# Patient Record
Sex: Male | Born: 1990 | Race: White | Hispanic: No | Marital: Single | Smoking: Current every day smoker
Health system: Southern US, Community
[De-identification: ages and names within clinical notes are randomized; demographics above are authoritative.]

## PROBLEM LIST (undated history)

## (undated) DIAGNOSIS — F3189 Other bipolar disorder: Secondary | ICD-10-CM

## (undated) DIAGNOSIS — S60418A Abrasion of other finger, initial encounter: Secondary | ICD-10-CM

## (undated) DIAGNOSIS — M2240 Chondromalacia patellae, unspecified knee: Secondary | ICD-10-CM

## (undated) DIAGNOSIS — K219 Gastro-esophageal reflux disease without esophagitis: Secondary | ICD-10-CM

## (undated) DIAGNOSIS — J302 Other seasonal allergic rhinitis: Secondary | ICD-10-CM

---
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
## 2011-01-13 ENCOUNTER — Encounter (HOSPITAL_COMMUNITY)
Admission: RE | Admit: 2011-01-13 | Discharge: 2011-01-13 | Disposition: A | Discharge status: Home / Self Care | Payer: 59 | Source: Ambulatory Visit | Attending: Podiatry | Admitting: Podiatry

## 2011-01-13 LAB — HEMOGLOBIN AND HEMATOCRIT, BLOOD
HCT: 45.4 % (ref 39.0–52.0)
Hemoglobin: 15.8 g/dL (ref 13.0–17.0)

## 2011-01-13 LAB — SURGICAL PCR SCREEN
MRSA, PCR: NEGATIVE
Staphylococcus aureus: NEGATIVE

## 2011-01-19 ENCOUNTER — Ambulatory Visit (HOSPITAL_COMMUNITY)
Admission: RE | Admit: 2011-01-19 | Discharge: 2011-01-19 | Disposition: A | Discharge status: Home / Self Care | Payer: 59 | Source: Ambulatory Visit | Attending: Podiatry | Admitting: Podiatry

## 2011-01-19 ENCOUNTER — Other Ambulatory Visit: Payer: Self-pay

## 2011-01-19 DIAGNOSIS — M795 Residual foreign body in soft tissue: Secondary | ICD-10-CM | POA: Insufficient documentation

## 2011-01-19 DIAGNOSIS — Z01812 Encounter for preprocedural laboratory examination: Secondary | ICD-10-CM | POA: Insufficient documentation

## 2011-01-19 DIAGNOSIS — B070 Plantar wart: Secondary | ICD-10-CM | POA: Insufficient documentation

## 2011-01-19 HISTORY — PX: FOOT FOREIGN BODY REMOVAL: SUR1116

## 2011-09-18 ENCOUNTER — Emergency Department (INDEPENDENT_AMBULATORY_CARE_PROVIDER_SITE_OTHER)
Admission: EM | Admit: 2011-09-18 | Discharge: 2011-09-18 | Discharge status: Against Medical Advice | Payer: 59 | Source: Home / Self Care

## 2011-09-18 NOTE — ED Notes (Signed)
Pt   Not in  Waiting room  When  [facility]

## 2011-09-18 NOTE — ED Notes (Signed)
Pt  Called   2  More  Times  5  mins  Apart       Must  Have  Eloped

## 2011-09-23 ENCOUNTER — Encounter (HOSPITAL_COMMUNITY): Payer: Self-pay | Admitting: Cardiology

## 2011-09-23 ENCOUNTER — Emergency Department (INDEPENDENT_AMBULATORY_CARE_PROVIDER_SITE_OTHER): Payer: 59

## 2011-09-23 ENCOUNTER — Emergency Department (HOSPITAL_COMMUNITY)
Admission: EM | Admit: 2011-09-23 | Discharge: 2011-09-23 | Disposition: A | Discharge status: Home / Self Care | Payer: 59 | Source: Home / Self Care | Attending: Emergency Medicine | Admitting: Emergency Medicine

## 2011-09-23 DIAGNOSIS — J40 Bronchitis, not specified as acute or chronic: Secondary | ICD-10-CM

## 2011-09-23 LAB — POCT RAPID STREP A: Streptococcus, Group A Screen (Direct): NEGATIVE

## 2011-09-23 MED ORDER — BENZONATATE 200 MG PO CAPS: 200.0000 mg | ORAL_CAPSULE | Freq: Three times a day (TID) | ORAL | Status: AC | PRN

## 2011-09-23 MED ORDER — AZITHROMYCIN 250 MG PO TABS: ORAL_TABLET | ORAL | Status: AC

## 2011-09-23 MED ORDER — ALBUTEROL SULFATE HFA 108 (90 BASE) MCG/ACT IN AERS: 1.0000 | INHALATION_SPRAY | Freq: Four times a day (QID) | RESPIRATORY_TRACT | Status: DC | PRN

## 2011-09-23 NOTE — ED Notes (Signed)
No E box for pt to sign out

## 2011-09-23 NOTE — ED Provider Notes (Signed)
Chief Complaint  Patient presents with  . Sore Throat  . Nasal Congestion  . Cough    History of Present Illness:   Clinton Fisher is a 21 year old male who has had a one half week history of headache, ear congestion, sore throat, postnasal drip, nasal congestion with yellow-green drainage, sinus pressure, cough productive yellow-green sputum with some blood, tightness in chest, fever of up to 101, chills, vomiting, and diarrhea. He saw Dr.Ehinger about a week ago and was given hydrocodone cough syrup, but doesn't feel any better.  Review of Systems:  Other than noted above, the patient denies any of the following symptoms. Systemic:  No fever, chills, sweats, fatigue, myalgias, headache, or anorexia. Eye:  No redness, pain or drainage. ENT:  No earache, nasal congestion, rhinorrhea, sinus pressure, or sore throat. Lungs:  No cough, sputum production, wheezing, shortness of breath. Or chest pain. GI:  No nausea, vomiting, abdominal pain or diarrhea. Skin:  No rash or itching.  PMFSH:  Past medical history, family history, social history, meds, and allergies were reviewed.  Physical Exam:   Vital signs:  BP 129/86  Pulse 93  Temp(Src) 98.3 F (36.8 C) (Oral)  Resp 18  SpO2 99% General:  Alert, in no distress. Eye:  No conjunctival injection or drainage. ENT:  TMs and canals were normal, without erythema or inflammation.  Nasal mucosa was clear and uncongested, without drainage.  Mucous membranes were moist.  Pharynx was clear, without exudate or drainage.  There were no oral ulcerations or lesions. Neck:  Supple, no adenopathy, tenderness or mass. Lungs:  No respiratory distress.  Lungs were clear to auscultation, without wheezes, rales or rhonchi.  Breath sounds were clear and equal bilaterally. Heart:  Regular rhythm, without gallops, murmers or rubs. Skin:  Clear, warm, and dry, without rash or lesions.  Labs:   Results for orders placed during the hospital encounter of 09/23/11  POCT  RAPID STREP A ([facility]RG CARE ONLY)      Component Value Range   Streptococcus, Group A Screen (Direct) NEGATIVE  NEGATIVE      Radiology:  Dg Chest 2 View  09/23/2011  *RADIOLOGY REPORT*  Clinical Data: Cough.  Fever  CHEST - 2 VIEW  Comparison: None  Findings: The heart size and mediastinal contours are within normal limits.  Both lungs are clear.  The visualized skeletal structures are unremarkable.  IMPRESSION: Negative exam.  Original Report Authenticated By: Rosealee Albee, M.D.    Assessment:   Diagnoses that have been ruled out:  None  Diagnoses that are still under consideration:  None  Final diagnoses:  Bronchitis      Plan:   1.  The following meds were prescribed:   New Prescriptions   ALBUTEROL (PROVENTIL HFA;VENTOLIN HFA) 108 (90 BASE) MCG/ACT INHALER    Inhale 1-2 puffs into the lungs every 6 (six) hours as needed for wheezing.   AZITHROMYCIN (ZITHROMAX Z-PAK) 250 MG TABLET    Take as directed.   BENZONATATE (TESSALON) 200 MG CAPSULE    Take 1 capsule (200 mg total) by mouth 3 (three) times daily as needed for cough.   2.  The patient was instructed in symptomatic care and handouts were given. 3.  The patient was told to return if becoming worse in any way, if no better in 3 or 4 days, and given some red flag symptoms that would indicate earlier return.   Roque Lias, MD 09/23/11 [phone]

## 2011-09-23 NOTE — ED Notes (Signed)
Pt report cough, sore throat, nasal congestion and ear pain. Pt report green/yellow phlem this morning. Symptoms have been going on for the past week. Reports little fever but has been taking tylenol for discomfort and hydrocodone cough med. Pt states the reason cough is under control during exam is due to taking cough med and he has taken last dose of cough med.

## 2011-09-23 NOTE — Discharge Instructions (Signed)

## 2011-10-22 ENCOUNTER — Emergency Department (HOSPITAL_COMMUNITY)
Admission: EM | Admit: 2011-10-22 | Discharge: 2011-10-22 | Disposition: A | Discharge status: Home / Self Care | Payer: 59 | Source: Home / Self Care | Attending: Family Medicine | Admitting: Family Medicine

## 2011-10-22 ENCOUNTER — Encounter (HOSPITAL_COMMUNITY): Payer: Self-pay

## 2011-10-22 DIAGNOSIS — J309 Allergic rhinitis, unspecified: Secondary | ICD-10-CM

## 2011-10-22 DIAGNOSIS — R05 Cough: Secondary | ICD-10-CM

## 2011-10-22 MED ORDER — HYDROCOD POLST-CHLORPHEN POLST 10-8 MG/5ML PO LQCR: 5.0000 mL | Freq: Two times a day (BID) | ORAL | Status: DC | PRN

## 2011-10-22 MED ORDER — FLUTICASONE PROPIONATE 50 MCG/ACT NA SUSP: 2.0000 | Freq: Every day | NASAL | Status: DC

## 2011-10-22 NOTE — ED Notes (Signed)
Pt has had headache, ears popping, coughing and throat soreness for three weeks. Was seen on 3-2 and had neg chest xray and given zpack.  Pt states he improved and then he started coughing again.  Had rx for cough medicine and lost it after two doses.

## 2011-10-22 NOTE — Discharge Instructions (Signed)
Use the prescribed nasal spray as directed and may also use an over the counter antihistamine such as loratadine (Claritin), cetirizine (Zyrtec), or fexofenadine (Allegra); discontinue current Allergy medication. Use the prescription cough syrup as directed and only as needed. Should you have fever, I recommend aggressive fever control with acetaminophen (Tylenol) and/or ibuprofen. You may use these together, alternating them every 4 hours, or individually, every 8 hours. For example, take acetaminophen 500 to 1000 mg at 12 noon, then 600 to 800 mg of ibuprofen at 4 pm, then acetaminophen at 8 pm, etc. Also, stay hydrated with clear liquids. Return to care should your symptoms not improve, or worsen in any way

## 2011-10-22 NOTE — ED Notes (Signed)
Mother called and states she found the empty hycodan bottle that he received on 10-16-11 and had stated he lost.  He denies taking it and instructed per Dr. Juanetta Gosling that she can either tear up the rx we gave him or she can monitor it and given it to him when he is coughing at hs.  She states she knows he has smoked pot in the past and worries that he may have a drug problem.  I instructed her to call [facility] if she is concerned.

## 2011-10-22 NOTE — ED Provider Notes (Signed)
History     CSN: [id number]  Arrival date & time 10/22/11  0913   First MD Initiated Contact with Patient 10/22/11 [phone]      Chief Complaint  Patient presents with  . URI    (Consider location/radiation/quality/duration/timing/severity/associated sxs/prior treatment) HPI Comments: Clinton Fisher presents for evaluation of 5 weeks of persistent cough. He and his mother report persistence of symptoms despite multiple treatment regimens. He was first seen by his primary care doctor and given hydrocodone cough syrup, which helped his symptoms mildly. He was then seen here on March 2 and was given albuterol, azithromycin, and Tessalon Perles. He reports that his symptoms improved, over the next week. But now returned. He reports nasal congestion, and nighttime cough. He also endorses a history of allergies. He is taking several over-the-counter preparations with mild improvement.  Patient is a 21 y.o. male presenting with cough. The history is provided by the patient and a parent.  Cough This is a recurrent problem. The current episode started more than 1 week ago. The problem occurs constantly. The problem has not changed since onset.The cough is productive of sputum. There has been no fever. Associated symptoms include headaches, rhinorrhea, sore throat and shortness of breath. Pertinent negatives include no wheezing. He has tried cough syrup and decongestants for the symptoms.    History reviewed. No pertinent past medical history.  History reviewed. No pertinent past surgical history.  History reviewed. No pertinent family history.  History  Substance Use Topics  . Smoking status: Never Smoker   . Smokeless tobacco: Not on file  . Alcohol Use: Yes     occas      Review of Systems  Constitutional: Negative.   HENT: Positive for congestion, sore throat, rhinorrhea and postnasal drip.   Eyes: Negative.   Respiratory: Positive for cough and shortness of breath. Negative for wheezing.     Cardiovascular: Negative.   Gastrointestinal: Negative.   Genitourinary: Negative.   Musculoskeletal: Negative.   Skin: Negative.   Neurological: Positive for headaches.    Allergies  Review of patient's allergies indicates no known allergies.  Home Medications   Current Outpatient Rx  Name Route Sig Dispense Refill  . BENZONATATE 200 MG PO CAPS Oral Take 200 mg by mouth 3 (three) times daily as needed.    . ACETAMINOPHEN 500 MG PO TABS Oral Take 1,000 mg by mouth every 6 (six) hours as needed.    . ALBUTEROL SULFATE HFA 108 (90 BASE) MCG/ACT IN AERS Inhalation Inhale 1-2 puffs into the lungs every 6 (six) hours as needed for wheezing. 1 Inhaler 0  . HYDROCOD POLST-CPM POLST ER 10-8 MG/5ML PO LQCR Oral Take 5 mLs by mouth every 12 (twelve) hours as needed. 140 mL 0  . FLUTICASONE PROPIONATE 50 MCG/ACT NA SUSP Nasal Place 2 sprays into the nose daily. 16 g 2  . PSEUDOEPHEDRINE-GUAIFENESIN ER [id number] MG PO TB12 Oral Take by mouth 2 (two) times daily.      BP 126/77  Pulse 90  Temp(Src) 99.1 F (37.3 C) (Oral)  Resp 16  SpO2 98%  Physical Exam  Nursing note and vitals reviewed. Constitutional: He is oriented to person, place, and time. He appears well-developed and well-nourished.  HENT:  Head: Normocephalic and atraumatic.  Right Ear: Tympanic membrane is retracted.  Left Ear: Tympanic membrane is retracted.  Mouth/Throat: Uvula is midline, oropharynx is clear and moist and mucous membranes are normal.  Eyes: EOM are normal.  Neck: Normal range of motion.  Cardiovascular:  Normal rate, regular rhythm, S1 normal, S2 normal and normal heart sounds.   No murmur heard. Pulmonary/Chest: Effort normal and breath sounds normal. He has no decreased breath sounds. He has no wheezes. He has no rhonchi.  Musculoskeletal: Normal range of motion.  Neurological: He is alert and oriented to person, place, and time.  Skin: Skin is warm and dry.  Psychiatric: His behavior is normal.     ED Course  Procedures (including critical care time)  Labs Reviewed - No data to display No results found.   1. Allergic rhinitis   2. Cough       MDM  Given rx for fluticasone and Tussionex cough syrup        Renaee Munda, MD 10/22/11 1010

## 2011-12-08 ENCOUNTER — Encounter (HOSPITAL_COMMUNITY): Payer: Self-pay | Admitting: *Deleted

## 2011-12-08 ENCOUNTER — Emergency Department (HOSPITAL_COMMUNITY)
Admission: EM | Admit: 2011-12-08 | Discharge: 2011-12-08 | Disposition: A | Discharge status: Home / Self Care | Payer: 59 | Attending: Emergency Medicine | Admitting: Emergency Medicine

## 2011-12-08 DIAGNOSIS — M79609 Pain in unspecified limb: Secondary | ICD-10-CM | POA: Insufficient documentation

## 2011-12-08 DIAGNOSIS — T6391XA Toxic effect of contact with unspecified venomous animal, accidental (unintentional), initial encounter: Secondary | ICD-10-CM | POA: Insufficient documentation

## 2011-12-08 DIAGNOSIS — W5911XA Bitten by nonvenomous snake, initial encounter: Secondary | ICD-10-CM

## 2011-12-08 DIAGNOSIS — T63001A Toxic effect of unspecified snake venom, accidental (unintentional), initial encounter: Secondary | ICD-10-CM | POA: Insufficient documentation

## 2011-12-08 DIAGNOSIS — R942 Abnormal results of pulmonary function studies: Secondary | ICD-10-CM | POA: Insufficient documentation

## 2011-12-08 NOTE — ED Provider Notes (Signed)
History     CSN: [id number]  Arrival date & time 12/08/11  1321   First MD Initiated Contact with Patient 12/08/11 1415      Chief Complaint  Patient presents with  . Snake Bite    (Consider location/radiation/quality/duration/timing/severity/associated sxs/prior treatment) HPI History provided by pt.   Pt was cutting limbs in woods this afternoon and felt something poke the dorsal surface of his right hand while laying the limbs in a pile on the ground.  Believes he was bitten by a baby copperhead snake because he has seen several in the woods in the past.  Has burning pain at site of wound that radiates up extensor surface of forearm to elbow.  Associated w/ paresthesias 2nd-4th digits.  No drainage from wound and denies fever.  Tetanus up to date.History reviewed. No pertinent past medical history.  History reviewed. No pertinent past surgical history.  No family history on file.  History  Substance Use Topics  . Smoking status: Never Smoker   . Smokeless tobacco: Not on file  . Alcohol Use: Yes     occas      Review of Systems  All other systems reviewed and are negative.    Allergies  Review of patient's allergies indicates no known allergies.  Home Medications   Current Outpatient Rx  Name Route Sig Dispense Refill  . ACETAMINOPHEN 500 MG PO TABS Oral Take 1,000 mg by mouth daily as needed. For headache      BP 149/90  Pulse 94  Temp(Src) 98.3 F (36.8 C) (Oral)  Resp 20  SpO2 98%  Physical Exam  Nursing note and vitals reviewed. Constitutional: He is oriented to person, place, and time. He appears well-developed and well-nourished. No distress.       Anxious appearing  HENT:  Head: Normocephalic and atraumatic.  Eyes:       Normal appearance  Neck: Normal range of motion.  Pulmonary/Chest: Effort normal.  Musculoskeletal: Normal range of motion.       Pinhead-sized, hemostatic puncture wound between distal 2nd and 3rd metacarpal on dorsal  surface of hand.  <0.5cm superficial scratch just lateral.  Slight surrounding edema, approx a quarter size in diameter, but no erythema.  Mild ttp of entire dorsum of hand as well as forearm.  Full active ROM and sensation intact in all fingers.  2+ radial pulse.    Neurological: He is alert and oriented to person, place, and time.  Psychiatric: He has a normal mood and affect. His behavior is normal.    ED Course  Procedures (including critical care time)  Labs Reviewed - No data to display No results found.   1. Snake bite       MDM  Pt presents w/ c/o snake bite to dorsal surface of right hand that he sustained while placing limbs onto a pile of wood on the ground.  Did not see a snake.  I think it more likely that a piece of wood poked him.  Has a pinhead-sized puncture wound and <0.5cm superficial scratch between 2nd and 3rd metacarpals w/ minimal surrounding edema but no erythema/drainage nor NS deficits.  Pt reassured.  I offered for him to stay another hour to be monitored and he declined.  Return precautions discussed.  His tetanus is up to date.         Otilio Miu, [place] 12/08/11 1946

## 2011-12-08 NOTE — Discharge Instructions (Signed)
Please return to the ER immediately if you develop worsening pain/swelling, spreading redness or drainage of pus at site of wound, or fever. Snake Bite Snakes may be either venomous (containing poison) or nonvenomous (nonpoisonous). A nonvenomous snake bite will cause trauma or a wound to the skin and possibly the deeper tissues. A venomous snake will also cause a traumatic wound, but more importantly, it may have injected venom into the wound. Snake bite venom can be extremely serious and even deadly. One type of venom may cause major skin, tissue and muscle damage, and failure of normal blood clotting. This may cause extreme swelling and pain of the affected area. Another type of venom can affect the brain and nervous system and may cause death. The treatment for venomous snake bite may require the use of antivenom medicine. If you are unsure if your bite is from a venomous snake, you MUST seek immediate medical attention. YOU MIGHT NEED A TETANUS SHOT NOW IF:  You have no idea when you had the last one.   You have never had a tetanus shot before.   The bite broke your skin.  If you need a tetanus shot, and you decide not to get one, there is a rare chance of getting tetanus. Sickness from tetanus can be serious. HOME CARE INSTRUCTIONS  A snake bit you and caused a skin wound. It may or may not have been venomous. If the snake was venomous, a small amount of venom may have been injected into your skin.  Keep the bite area clean and dry.   Keep the extremity elevated above the level of the heart for the next 48 hours.   Wash the bite area 3 times daily with soap and water or an antiseptic. Apply an adhesive or gauze bandage to the bite area.   If you develop blistering of any type at the site of the bite, protect the blisters from breaking. Do not attempt to open it.   If you were given a tetanus shot, your arm may get swollen, red and warm at the shot site. This is a common response to the  injection.  SEEK IMMEDIATE MEDICAL CARE IF:   You develop symptoms of poisoning including increased pain, redness, swelling, blood blisters or purple spots in the bite area, nausea, vomiting, numbness, tingling, excessive sweating, breathing difficulty, blurred vision, feelings of lightheadedness, or feeling faint. If you develop symptoms of poisoning, you MUST seek immediate medical attention.   The bite becomes infected. Symptoms may include redness, swelling, pain, tenderness, pus, red streaks running from the wound, or an oral temperature above 102 F (38.9 C), not controlled by medicine.   Your condition or wound becomes worse.  MAKE SURE YOU:   Understand these instructions.   Will watch your condition.   Will get help right away if you are not doing well or get worse.  Document Released: 07/07/2000 Document Revised: 06/29/2011 Document Reviewed: 12/01/2009 [facility] Patient Information 2012 [place], [place].

## 2011-12-08 NOTE — ED Notes (Signed)
Pt reports moving tree branches in woods when he got bit to his right hand by something. Pt unsure of what bit him. Pt has two small red areas noted to right hand. Grandmother had pt put tourniquet on arm, pt reports numbness, tingling to fingers. States had episode of chest pain on way to ED, but relieved now. No other pain.

## 2011-12-08 NOTE — ED Notes (Signed)
PT was in woods moving trees. Pt reports a snake bite but did not see the snake. Occurrence approx 20 mins.

## 2011-12-09 NOTE — ED Provider Notes (Signed)
Medical screening examination/treatment/procedure(s) were performed by non-physician practitioner and as supervising physician I was immediately available for consultation/collaboration.  Kenzo Ozment, MD 12/09/11 0704 

## 2012-10-15 ENCOUNTER — Emergency Department (HOSPITAL_COMMUNITY)
Admission: EM | Admit: 2012-10-15 | Discharge: 2012-10-15 | Disposition: A | Discharge status: Home / Self Care | Payer: 59 | Attending: Emergency Medicine | Admitting: Emergency Medicine

## 2012-10-15 ENCOUNTER — Encounter (HOSPITAL_COMMUNITY): Payer: Self-pay

## 2012-10-15 DIAGNOSIS — A088 Other specified intestinal infections: Secondary | ICD-10-CM | POA: Insufficient documentation

## 2012-10-15 DIAGNOSIS — R1084 Generalized abdominal pain: Secondary | ICD-10-CM | POA: Insufficient documentation

## 2012-10-15 DIAGNOSIS — Z79899 Other long term (current) drug therapy: Secondary | ICD-10-CM | POA: Insufficient documentation

## 2012-10-15 DIAGNOSIS — R197 Diarrhea, unspecified: Secondary | ICD-10-CM | POA: Insufficient documentation

## 2012-10-15 DIAGNOSIS — A084 Viral intestinal infection, unspecified: Secondary | ICD-10-CM

## 2012-10-15 DIAGNOSIS — R42 Dizziness and giddiness: Secondary | ICD-10-CM | POA: Insufficient documentation

## 2012-10-15 DIAGNOSIS — R195 Other fecal abnormalities: Secondary | ICD-10-CM | POA: Insufficient documentation

## 2012-10-15 LAB — URINALYSIS, ROUTINE W REFLEX MICROSCOPIC
Glucose, UA: NEGATIVE mg/dL
Hgb urine dipstick: NEGATIVE
Ketones, ur: 15 mg/dL — AB
Leukocytes, UA: NEGATIVE
Nitrite: NEGATIVE
Protein, ur: 30 mg/dL — AB
Specific Gravity, Urine: 1.036 — ABNORMAL HIGH (ref 1.005–1.030)
Urobilinogen, UA: 0.2 mg/dL (ref 0.0–1.0)
pH: 5.5 (ref 5.0–8.0)

## 2012-10-15 LAB — CBC WITH DIFFERENTIAL/PLATELET
Basophils Absolute: 0 10*3/uL (ref 0.0–0.1)
Basophils Relative: 0 % (ref 0–1)
Eosinophils Absolute: 0 10*3/uL (ref 0.0–0.7)
Eosinophils Relative: 0 % (ref 0–5)
HCT: 44.1 % (ref 39.0–52.0)
Hemoglobin: 15.5 g/dL (ref 13.0–17.0)
Lymphocytes Relative: 7 % — ABNORMAL LOW (ref 12–46)
Lymphs Abs: 0.5 10*3/uL — ABNORMAL LOW (ref 0.7–4.0)
MCH: 30.2 pg (ref 26.0–34.0)
MCHC: 35.1 g/dL (ref 30.0–36.0)
MCV: 86 fL (ref 78.0–100.0)
Monocytes Absolute: 0.9 10*3/uL (ref 0.1–1.0)
Monocytes Relative: 11 % (ref 3–12)
Neutro Abs: 6.5 10*3/uL (ref 1.7–7.7)
Neutrophils Relative %: 82 % — ABNORMAL HIGH (ref 43–77)
Platelets: 193 10*3/uL (ref 150–400)
RBC: 5.13 MIL/uL (ref 4.22–5.81)
RDW: 12.1 % (ref 11.5–15.5)
WBC: 8 10*3/uL (ref 4.0–10.5)

## 2012-10-15 LAB — BASIC METABOLIC PANEL
BUN: 14 mg/dL (ref 6–23)
CO2: 26 mEq/L (ref 19–32)
Calcium: 7.8 mg/dL — ABNORMAL LOW (ref 8.4–10.5)
Chloride: 104 mEq/L (ref 96–112)
Creatinine, Ser: 0.82 mg/dL (ref 0.50–1.35)
GFR calc Af Amer: >90 mL/min (ref >90)
GFR calc non Af Amer: >90 mL/min (ref >90)
Glucose, Bld: 98 mg/dL (ref 70–99)
Potassium: 3.5 mEq/L (ref 3.5–5.1)
Sodium: 136 mEq/L (ref 135–145)

## 2012-10-15 LAB — URINE MICROSCOPIC-ADD ON

## 2012-10-15 LAB — OCCULT BLOOD, POC DEVICE: Fecal Occult Bld: POSITIVE — AB

## 2012-10-15 MED ORDER — OMEPRAZOLE 20 MG PO CPDR: 20.0000 mg | DELAYED_RELEASE_CAPSULE | Freq: Every day | ORAL | Status: DC

## 2012-10-15 MED ORDER — SODIUM CHLORIDE 0.9 % IV BOLUS (SEPSIS)
1000.0000 mL | Freq: Once | INTRAVENOUS | Status: AC
  Administered 2012-10-15: 1000 mL via INTRAVENOUS

## 2012-10-15 MED ORDER — ONDANSETRON HCL 4 MG/2ML IJ SOLN
4.0000 mg | Freq: Once | INTRAMUSCULAR | Status: AC
  Administered 2012-10-15: 4 mg via INTRAVENOUS
  Filled 2012-10-15: qty 2

## 2012-10-15 MED ORDER — PROMETHAZINE HCL 25 MG PO TABS: 25.0000 mg | ORAL_TABLET | Freq: Four times a day (QID) | ORAL | Status: DC | PRN

## 2012-10-15 NOTE — ED Notes (Addendum)
Patient presents with an acute onset of weakness, nausea, vomiting and diarrhea around 0200 this AM. Reports that his grandmother, mother, girlfriend, and daughter has the similar symptoms that began around the same time. Reports eating at a [place] with his family 2 days ago (10/13/12). Patient tachycardic in triage.

## 2012-10-15 NOTE — ED Provider Notes (Signed)
History     CSN: [id number]  Arrival date & time 10/15/12  2018   First MD Initiated Contact with Patient 10/15/12 2105      Chief Complaint  Patient presents with  . Emesis  . Diarrhea  . Nausea    (Consider location/radiation/quality/duration/timing/severity/associated sxs/prior treatment) HPI Comments: Patient is a 22 year old male who presents for nausea, vomiting, and diarrhea since 2:30 this morning.patient states he has "too many episodes to count" of both vomiting and diarrhea both of which were nonbloody, though he states he has had one episode of emesis that was "orange" in color. Patient has been unable to keep down food and fluids by mouth since symptom onset. He denies any aggravating or alleviating factors the symptoms. Patient admits to a cramping sensation in his upper abdomen that is intermittent and nonradiating. He further admits to some lightheadedness and dizziness primarily with position change. Patient denies fever, vision changes, syncope, chest pain or shortness of breath, urinary symptoms, and numbness or tingling in his extremities. Patient admits to sick contacts stating that both his mother and his grandmother got sick with the same symptoms, beginning around the same time.  Patient is a 22 y.o. male presenting with vomiting and diarrhea. The history is provided by the patient. No language interpreter was used.  Emesis Associated symptoms: abdominal pain and diarrhea   Associated symptoms: no chills   Diarrhea Associated symptoms: abdominal pain and vomiting   Associated symptoms: no chills and no fever     History reviewed. No pertinent past medical history.  History reviewed. No pertinent past surgical history.  No family history on file.  History  Substance Use Topics  . Smoking status: Never Smoker   . Smokeless tobacco: Never Used  . Alcohol Use: Yes     Comment: occas      Review of Systems  Constitutional: Negative for fever and chills.   HENT: Negative for neck pain and neck stiffness.   Eyes: Negative for visual disturbance.  Respiratory: Negative for chest tightness and shortness of breath.   Cardiovascular: Negative for chest pain.  Gastrointestinal: Positive for nausea, vomiting, abdominal pain and diarrhea.  Genitourinary: Negative for dysuria and hematuria.  Skin: Negative for rash.  Neurological: Positive for dizziness and light-headedness. Negative for syncope and numbness.  All other systems reviewed and are negative.    Allergies  Review of patient's allergies indicates no known allergies.  Home Medications   Current Outpatient Rx  Name  Route  Sig  Dispense  Refill  . omeprazole (PRILOSEC) 20 MG capsule   Oral   Take 1 capsule (20 mg total) by mouth daily.   30 capsule   0   . promethazine (PHENERGAN) 25 MG tablet   Oral   Take 1 tablet (25 mg total) by mouth every 6 (six) hours as needed for nausea.   12 tablet   0     BP 112/67  Pulse 111  Temp(Src) 98 F (36.7 C) (Oral)  SpO2 99%  Physical Exam  Nursing note and vitals reviewed. Constitutional: He is oriented to person, place, and time. He appears well-developed and well-nourished. No distress.  HENT:  Head: Normocephalic and atraumatic.  Oropharynx clear and dry without erythema or edema  Eyes: Conjunctivae and EOM are normal. Pupils are equal, round, and reactive to light. No scleral icterus.  Neck: Normal range of motion. Neck supple.  Cardiovascular: Normal rate, regular rhythm, normal heart sounds and intact distal pulses.   Distal radial, dorsalis  pedis, and posterior tibial pulses 2+ bilaterally  Pulmonary/Chest: Effort normal and breath sounds normal. No respiratory distress. He has no wheezes. He has no rales.  Abdominal: Soft. Bowel sounds are normal. He exhibits no distension and no mass. There is tenderness. There is no rebound and no guarding.  Patient has some mild generalized tenderness on deep palpation without  peritoneal signs. McBurney's point negative and negative Murphy sign.  Genitourinary:  Patient has normal rectal tone on exam. Stool appreciated to be brown in color without gross blood.  Musculoskeletal: Normal range of motion.  Lymphadenopathy:    He has no cervical adenopathy.  Neurological: He is alert and oriented to person, place, and time.  Skin: Skin is warm and dry. He is not diaphoretic. No erythema.  Psychiatric: He has a normal mood and affect. His behavior is normal.    ED Course  Procedures (including critical care time)  Labs Reviewed  URINALYSIS, ROUTINE W REFLEX MICROSCOPIC - Abnormal; Notable for the following:    Color, Urine AMBER (*)    Specific Gravity, Urine 1.036 (*)    Bilirubin Urine SMALL (*)    Ketones, ur 15 (*)    Protein, ur 30 (*)    All other components within normal limits  CBC WITH DIFFERENTIAL - Abnormal; Notable for the following:    Neutrophils Relative 82 (*)    Lymphocytes Relative 7 (*)    Lymphs Abs 0.5 (*)    All other components within normal limits  BASIC METABOLIC PANEL - Abnormal; Notable for the following:    Calcium 7.8 (*)    All other components within normal limits  OCCULT BLOOD, POC DEVICE - Abnormal; Notable for the following:    Fecal Occult Bld POSITIVE (*)    All other components within normal limits  URINE MICROSCOPIC-ADD ON   No results found.   1. Viral gastroenteritis   2. Hemoccult positive    MDM  Patient presents for nausea, vomiting, and diarrhea since 2:00 this morning. Patient's mother and grandmother also began with similar symptoms this morning; no common food source identified. Patient will be treated with IV fluid hydration and Zofran for nausea. UA will be obtained to assess patient's dehydration.  9:30 Patient had a bowel movement and told the nurse that it looked like dark coffee grounds. Hemoccult ordered for evaluation of possible GI bleeding. Patient states that his symptoms are improving with  IV fluid hydration and if he is no longer feeling nauseous.   10:30 Hemoccult positive. Have ordered basic labs to evaluate for potential anemia. Will continue to monitor.  11:30 Patient hemodynamically stable and not symptomatic with orthostatic testing. Tolerating PO fluids. Patient is nontoxic appearing and in NAD; stable for d/c with PCP follow up regarding his symptoms and positive hemoccult. Will prescribe phenergan for nausea/vomiting and omeprazole trial in light of positive hemoccult. Patient states comfort and understanding with this d/c plan with no unaddressed concerns. Indications for ED return discussed.       Antony Madura, PA-C 10/18/12 1926

## 2012-10-20 NOTE — ED Provider Notes (Signed)
Medical screening examination/treatment/procedure(s) were performed by non-physician practitioner and as supervising physician I was immediately available for consultation/collaboration.   Nelia Shi, MD 10/20/12 0200

## 2013-04-23 ENCOUNTER — Encounter (HOSPITAL_COMMUNITY): Payer: Self-pay

## 2013-04-23 ENCOUNTER — Emergency Department (INDEPENDENT_AMBULATORY_CARE_PROVIDER_SITE_OTHER): Payer: 59

## 2013-04-23 ENCOUNTER — Emergency Department (HOSPITAL_COMMUNITY)
Admission: EM | Admit: 2013-04-23 | Discharge: 2013-04-23 | Disposition: A | Discharge status: Home / Self Care | Payer: 59 | Source: Home / Self Care | Attending: Family Medicine | Admitting: Family Medicine

## 2013-04-23 DIAGNOSIS — Z23 Encounter for immunization: Secondary | ICD-10-CM

## 2013-04-23 DIAGNOSIS — IMO0002 Reserved for concepts with insufficient information to code with codable children: Secondary | ICD-10-CM

## 2013-04-23 DIAGNOSIS — T148XXA Other injury of unspecified body region, initial encounter: Secondary | ICD-10-CM

## 2013-04-23 DIAGNOSIS — S61219A Laceration without foreign body of unspecified finger without damage to nail, initial encounter: Secondary | ICD-10-CM

## 2013-04-23 DIAGNOSIS — S61209A Unspecified open wound of unspecified finger without damage to nail, initial encounter: Secondary | ICD-10-CM

## 2013-04-23 MED ORDER — TETANUS-DIPHTH-ACELL PERTUSSIS 5-2.5-18.5 LF-MCG/0.5 IM SUSP
INTRAMUSCULAR | Status: AC
  Filled 2013-04-23: qty 0.5

## 2013-04-23 MED ORDER — CEPHALEXIN 250 MG PO CAPS: 250.0000 mg | ORAL_CAPSULE | Freq: Three times a day (TID) | ORAL | Status: DC

## 2013-04-23 MED ORDER — TETANUS-DIPHTH-ACELL PERTUSSIS 5-2.5-18.5 LF-MCG/0.5 IM SUSP
0.5000 mL | Freq: Once | INTRAMUSCULAR | Status: AC
  Administered 2013-04-23: 0.5 mL via INTRAMUSCULAR

## 2013-04-23 MED ORDER — HYDROCODONE-ACETAMINOPHEN 5-325 MG PO TABS: 2.0000 | ORAL_TABLET | ORAL | Status: DC | PRN

## 2013-04-23 NOTE — ED Provider Notes (Signed)
CSN: [id number]     Arrival date & time 04/23/13  1858 History   First MD Initiated Contact with Patient 04/23/13 1943     Chief Complaint  Patient presents with  . Finger Injury   (Consider location/radiation/quality/duration/timing/severity/associated sxs/prior Treatment) HPI Comments: 22 yo male reports something heavy fell on right 3rd finger and now bleeding with pain.   History reviewed. No pertinent past medical history. History reviewed. No pertinent past surgical history. History reviewed. No pertinent family history. History  Substance Use Topics  . Smoking status: Never Smoker   . Smokeless tobacco: Never Used  . Alcohol Use: Yes     Comment: occas    Review of Systems  Constitutional: Negative.   Respiratory: Negative.   Cardiovascular: Negative.   Gastrointestinal: Negative.   Musculoskeletal: Positive for myalgias and arthralgias.  Skin: Positive for wound.  Neurological: Negative.   Psychiatric/Behavioral: Negative.     Allergies  Review of patient's allergies indicates no known allergies.  Home Medications   Current Outpatient Rx  Name  Route  Sig  Dispense  Refill  . omeprazole (PRILOSEC) 20 MG capsule   Oral   Take 1 capsule (20 mg total) by mouth daily.   30 capsule   0   . promethazine (PHENERGAN) 25 MG tablet   Oral   Take 1 tablet (25 mg total) by mouth every 6 (six) hours as needed for nausea.   12 tablet   0    BP 148/89  Pulse 93  Temp(Src) 98.8 F (37.1 C) (Oral)  Resp 19  SpO2 97% Physical Exam  Nursing note and vitals reviewed. Constitutional: He is oriented to person, place, and time. He appears well-developed and well-nourished.  Musculoskeletal:  Decreased ROM right 3rd finger with pain and edema   Neurological: He is alert and oriented to person, place, and time. He has normal reflexes.  Skin: Skin is warm and dry.  Right 3rd finger with 0.5cm laceration. Cleaned with betadine. Minimal fat lobules exposed. No tendon  involvement.     ED Course  Procedures (including critical care time) Labs Review Labs Reviewed - No data to display Imaging Review Dg Finger Middle Right  04/23/2013   CLINICAL DATA:  Pain post trauma  EXAM: RIGHT 3RD FINGER 2+V  COMPARISON:  None.  FINDINGS: Frontal, oblique, and lateral views were obtained. There is a fracture of the distal aspect of the 3rd distal phalanx in near anatomic alignment. There is soft tissue injury in this area. No other fracture. No dislocation. Joint spaces appear intact.  IMPRESSION: Fracture distal aspect 3rd distal phalanx. Soft tissue injury distally seen as well.   Electronically Signed   By: Bretta Bang   On: 04/23/2013 19:43    MDM  Right 3rd finger crush injury with open wound and tuft fracture. Advised wound care/ rest/ ice/ elevate. Follow up hand specialists 1-3 days. Bandage and splint applied. Keflex 250 mg 1 po QID #28 and Norco5/325 1 q 6 hours prn pain.    Berenice Primas, PA-C 04/23/13 2203

## 2013-04-23 NOTE — ED Notes (Addendum)
Crush injury to right long finger earlier today

## 2013-04-25 NOTE — ED Provider Notes (Signed)
Medical screening examination/treatment/procedure(s) were performed by resident physician or non-physician practitioner and as supervising physician I was immediately available for consultation/collaboration.   Zailah Zagami DOUGLAS MD.   Daanya Lanphier D Yulisa Chirico, MD 04/25/13 0918 

## 2013-06-16 ENCOUNTER — Other Ambulatory Visit (HOSPITAL_COMMUNITY): Payer: Self-pay | Admitting: Orthopedic Surgery

## 2013-06-16 DIAGNOSIS — M25561 Pain in right knee: Secondary | ICD-10-CM

## 2013-06-24 ENCOUNTER — Ambulatory Visit (HOSPITAL_COMMUNITY): Payer: 59

## 2013-07-04 ENCOUNTER — Ambulatory Visit (HOSPITAL_COMMUNITY)
Admission: RE | Admit: 2013-07-04 | Discharge: 2013-07-04 | Disposition: A | Discharge status: Home / Self Care | Payer: 59 | Source: Ambulatory Visit | Attending: Orthopedic Surgery | Admitting: Orthopedic Surgery

## 2013-07-04 DIAGNOSIS — M25561 Pain in right knee: Secondary | ICD-10-CM

## 2013-07-04 DIAGNOSIS — M25569 Pain in unspecified knee: Secondary | ICD-10-CM | POA: Insufficient documentation

## 2013-07-15 ENCOUNTER — Other Ambulatory Visit: Payer: Self-pay | Admitting: Orthopedic Surgery

## 2013-07-24 DIAGNOSIS — M2240 Chondromalacia patellae, unspecified knee: Secondary | ICD-10-CM

## 2013-07-24 HISTORY — DX: Chondromalacia patellae, unspecified knee: M22.40

## 2013-07-25 ENCOUNTER — Encounter (HOSPITAL_BASED_OUTPATIENT_CLINIC_OR_DEPARTMENT_OTHER): Payer: Self-pay | Admitting: *Deleted

## 2013-07-29 ENCOUNTER — Encounter (HOSPITAL_BASED_OUTPATIENT_CLINIC_OR_DEPARTMENT_OTHER): Payer: Self-pay | Admitting: *Deleted

## 2013-07-29 DIAGNOSIS — S60418A Abrasion of other finger, initial encounter: Secondary | ICD-10-CM

## 2013-07-29 HISTORY — DX: Abrasion of other finger, initial encounter: S60.418A

## 2013-08-01 ENCOUNTER — Ambulatory Visit (HOSPITAL_BASED_OUTPATIENT_CLINIC_OR_DEPARTMENT_OTHER): Payer: 59 | Admitting: *Deleted

## 2013-08-01 ENCOUNTER — Encounter (HOSPITAL_BASED_OUTPATIENT_CLINIC_OR_DEPARTMENT_OTHER): Payer: Self-pay

## 2013-08-01 ENCOUNTER — Ambulatory Visit (HOSPITAL_BASED_OUTPATIENT_CLINIC_OR_DEPARTMENT_OTHER)
Admission: RE | Admit: 2013-08-01 | Discharge: 2013-08-01 | Disposition: A | Discharge status: Home / Self Care | Payer: 59 | Source: Ambulatory Visit | Attending: Orthopedic Surgery | Admitting: Orthopedic Surgery

## 2013-08-01 ENCOUNTER — Encounter (HOSPITAL_BASED_OUTPATIENT_CLINIC_OR_DEPARTMENT_OTHER): Payer: 59 | Admitting: *Deleted

## 2013-08-01 ENCOUNTER — Encounter (HOSPITAL_BASED_OUTPATIENT_CLINIC_OR_DEPARTMENT_OTHER)
Admission: RE | Disposition: A | Discharge status: Home / Self Care | Payer: Self-pay | Source: Ambulatory Visit | Attending: Orthopedic Surgery

## 2013-08-01 DIAGNOSIS — M25569 Pain in unspecified knee: Secondary | ICD-10-CM | POA: Insufficient documentation

## 2013-08-01 DIAGNOSIS — K219 Gastro-esophageal reflux disease without esophagitis: Secondary | ICD-10-CM | POA: Insufficient documentation

## 2013-08-01 DIAGNOSIS — M2240 Chondromalacia patellae, unspecified knee: Secondary | ICD-10-CM | POA: Insufficient documentation

## 2013-08-01 DIAGNOSIS — M6750 Plica syndrome, unspecified knee: Secondary | ICD-10-CM | POA: Insufficient documentation

## 2013-08-01 HISTORY — PX: KNEE ARTHROSCOPY: SHX127

## 2013-08-01 HISTORY — DX: Other seasonal allergic rhinitis: J30.2

## 2013-08-01 HISTORY — DX: Chondromalacia patellae, unspecified knee: M22.40

## 2013-08-01 HISTORY — DX: Gastro-esophageal reflux disease without esophagitis: K21.9

## 2013-08-01 HISTORY — DX: Abrasion of other finger, initial encounter: S60.418A

## 2013-08-01 SURGERY — ARTHROSCOPY, KNEE
Anesthesia: General | Site: Knee | Laterality: Right

## 2013-08-01 MED ORDER — OXYCODONE HCL 5 MG PO TABS
5.0000 mg | ORAL_TABLET | Freq: Once | ORAL | Status: AC | PRN
  Administered 2013-08-01: 5 mg via ORAL
  Filled 2013-08-01: qty 1

## 2013-08-01 MED ORDER — FENTANYL CITRATE 0.05 MG/ML IJ SOLN: 50.0000 ug | INTRAMUSCULAR | Status: DC | PRN

## 2013-08-01 MED ORDER — SODIUM CHLORIDE 0.9 % IR SOLN
Status: DC | PRN
  Administered 2013-08-01: 1

## 2013-08-01 MED ORDER — POVIDONE-IODINE 7.5 % EX SOLN: Freq: Once | CUTANEOUS | Status: DC

## 2013-08-01 MED ORDER — LACTATED RINGERS IV SOLN
INTRAVENOUS | Status: DC
  Administered 2013-08-01: 13:00:00 via INTRAVENOUS

## 2013-08-01 MED ORDER — CEFAZOLIN SODIUM-DEXTROSE 2-3 GM-% IV SOLR
2.0000 g | INTRAVENOUS | Status: AC
  Administered 2013-08-01: 2 g via INTRAVENOUS

## 2013-08-01 MED ORDER — FENTANYL CITRATE 0.05 MG/ML IJ SOLN
INTRAMUSCULAR | Status: DC | PRN
  Administered 2013-08-01: 100 ug via INTRAVENOUS

## 2013-08-01 MED ORDER — FENTANYL CITRATE 0.05 MG/ML IJ SOLN
INTRAMUSCULAR | Status: AC
  Filled 2013-08-01: qty 2

## 2013-08-01 MED ORDER — BUPIVACAINE HCL (PF) 0.5 % IJ SOLN
INTRAMUSCULAR | Status: DC | PRN
  Administered 2013-08-01: 20 mL

## 2013-08-01 MED ORDER — PROPOFOL 10 MG/ML IV BOLUS
INTRAVENOUS | Status: DC | PRN
  Administered 2013-08-01: 300 mg via INTRAVENOUS

## 2013-08-01 MED ORDER — OXYCODONE HCL 5 MG/5ML PO SOLN: 5.0000 mg | Freq: Once | ORAL | Status: AC | PRN

## 2013-08-01 MED ORDER — BUPIVACAINE HCL (PF) 0.5 % IJ SOLN
INTRAMUSCULAR | Status: AC
  Filled 2013-08-01: qty 30

## 2013-08-01 MED ORDER — MIDAZOLAM HCL 2 MG/2ML IJ SOLN: 1.0000 mg | INTRAMUSCULAR | Status: DC | PRN

## 2013-08-01 MED ORDER — FENTANYL CITRATE 0.05 MG/ML IJ SOLN
INTRAMUSCULAR | Status: AC
  Filled 2013-08-01: qty 4

## 2013-08-01 MED ORDER — PROPOFOL 10 MG/ML IV BOLUS
INTRAVENOUS | Status: AC
  Filled 2013-08-01: qty 20

## 2013-08-01 MED ORDER — EPINEPHRINE HCL 1 MG/ML IJ SOLN
INTRAMUSCULAR | Status: AC
  Filled 2013-08-01: qty 1

## 2013-08-01 MED ORDER — ONDANSETRON HCL 4 MG/2ML IJ SOLN: 4.0000 mg | Freq: Four times a day (QID) | INTRAMUSCULAR | Status: DC | PRN

## 2013-08-01 MED ORDER — MIDAZOLAM HCL 2 MG/2ML IJ SOLN
INTRAMUSCULAR | Status: AC
  Filled 2013-08-01: qty 2

## 2013-08-01 MED ORDER — EPINEPHRINE HCL 1 MG/ML IJ SOLN
INTRAMUSCULAR | Status: DC | PRN
  Administered 2013-08-01: 1 mg

## 2013-08-01 MED ORDER — MIDAZOLAM HCL 2 MG/ML PO SYRP: 12.0000 mg | ORAL_SOLUTION | Freq: Once | ORAL | Status: DC | PRN

## 2013-08-01 MED ORDER — MIDAZOLAM HCL 5 MG/5ML IJ SOLN
INTRAMUSCULAR | Status: DC | PRN
  Administered 2013-08-01: 2 mg via INTRAVENOUS

## 2013-08-01 MED ORDER — OXYCODONE-ACETAMINOPHEN 5-325 MG PO TABS: 1.0000 | ORAL_TABLET | Freq: Four times a day (QID) | ORAL | Status: DC | PRN

## 2013-08-01 MED ORDER — DEXAMETHASONE SODIUM PHOSPHATE 4 MG/ML IJ SOLN
INTRAMUSCULAR | Status: DC | PRN
  Administered 2013-08-01: 8 mg via INTRAVENOUS

## 2013-08-01 MED ORDER — CEFAZOLIN SODIUM-DEXTROSE 2-3 GM-% IV SOLR
INTRAVENOUS | Status: AC
  Filled 2013-08-01: qty 50

## 2013-08-01 MED ORDER — ONDANSETRON HCL 4 MG/2ML IJ SOLN
INTRAMUSCULAR | Status: DC | PRN
  Administered 2013-08-01: 4 mg via INTRAVENOUS

## 2013-08-01 MED ORDER — FENTANYL CITRATE 0.05 MG/ML IJ SOLN
25.0000 ug | INTRAMUSCULAR | Status: DC | PRN
Start: 2013-08-01 — End: 2013-08-01
  Administered 2013-08-01: 25 ug via INTRAVENOUS
  Administered 2013-08-01: 50 ug via INTRAVENOUS

## 2013-08-01 MED ORDER — BUPIVACAINE-EPINEPHRINE PF 0.5-1:200000 % IJ SOLN
INTRAMUSCULAR | Status: AC
  Filled 2013-08-01: qty 30

## 2013-08-01 MED ORDER — LIDOCAINE HCL (CARDIAC) 20 MG/ML IV SOLN
INTRAVENOUS | Status: DC | PRN
  Administered 2013-08-01: 100 mg via INTRAVENOUS

## 2013-08-01 SURGICAL SUPPLY — 42 items
BANDAGE ELASTIC 6 VELCRO ST LF (GAUZE/BANDAGES/DRESSINGS) ×3 IMPLANT
BLADE 4.2CUDA (BLADE) IMPLANT
BLADE GREAT WHITE 4.2 (BLADE) ×2 IMPLANT
BLADE GREAT WHITE 4.2MM (BLADE) ×1
CANISTER SUCT 3000ML (MISCELLANEOUS) IMPLANT
CANISTER SUCT LVC 12 LTR MEDI- (MISCELLANEOUS) ×2 IMPLANT
CUTTER MENISCUS  4.2MM (BLADE)
CUTTER MENISCUS 4.2MM (BLADE) IMPLANT
DRAPE ARTHROSCOPY W/POUCH 114 (DRAPES) ×3 IMPLANT
DRSG EMULSION OIL 3X3 NADH (GAUZE/BANDAGES/DRESSINGS) ×3 IMPLANT
DURAPREP 26ML APPLICATOR (WOUND CARE) ×3 IMPLANT
ELECT MENISCUS 165MM 90D (ELECTRODE) IMPLANT
ELECT REM PT RETURN 9FT ADLT (ELECTROSURGICAL)
ELECTRODE REM PT RTRN 9FT ADLT (ELECTROSURGICAL) IMPLANT
GLOVE BIO SURGEON STRL SZ 6.5 (GLOVE) ×1 IMPLANT
GLOVE BIO SURGEONS STRL SZ 6.5 (GLOVE) ×1
GLOVE BIOGEL PI IND STRL 7.0 (GLOVE) IMPLANT
GLOVE BIOGEL PI IND STRL 8 (GLOVE) ×2 IMPLANT
GLOVE BIOGEL PI INDICATOR 7.0 (GLOVE) ×2
GLOVE BIOGEL PI INDICATOR 8 (GLOVE) ×4
GLOVE ECLIPSE 7.5 STRL STRAW (GLOVE) ×6 IMPLANT
GOWN STRL REUS W/ TWL LRG LVL3 (GOWN DISPOSABLE) ×1 IMPLANT
GOWN STRL REUS W/ TWL XL LVL3 (GOWN DISPOSABLE) ×1 IMPLANT
GOWN STRL REUS W/TWL LRG LVL3 (GOWN DISPOSABLE) ×3
GOWN STRL REUS W/TWL XL LVL3 (GOWN DISPOSABLE) ×6 IMPLANT
HOLDER KNEE FOAM BLUE (MISCELLANEOUS) ×3 IMPLANT
KNEE WRAP E Z 3 GEL PACK (MISCELLANEOUS) ×3 IMPLANT
NDL SAFETY ECLIPSE 18X1.5 (NEEDLE) IMPLANT
NEEDLE HYPO 18GX1.5 SHARP (NEEDLE) ×3
PACK ARTHROSCOPY DSU (CUSTOM PROCEDURE TRAY) ×3 IMPLANT
PACK BASIN DAY SURGERY FS (CUSTOM PROCEDURE TRAY) ×3 IMPLANT
PAD CAST 4YDX4 CTTN HI CHSV (CAST SUPPLIES) ×1 IMPLANT
PADDING CAST COTTON 4X4 STRL (CAST SUPPLIES) ×3
PENCIL BUTTON HOLSTER BLD 10FT (ELECTRODE) IMPLANT
SET ARTHROSCOPY TUBING (MISCELLANEOUS) ×3
SET ARTHROSCOPY TUBING LN (MISCELLANEOUS) ×1 IMPLANT
SPONGE GAUZE 4X4 12PLY (GAUZE/BANDAGES/DRESSINGS) ×3 IMPLANT
SUT ETHILON 4 0 PS 2 18 (SUTURE) IMPLANT
SYR 5ML LL (SYRINGE) ×3 IMPLANT
TOWEL OR 17X24 6PK STRL BLUE (TOWEL DISPOSABLE) ×3 IMPLANT
TOWEL OR NON WOVEN STRL DISP B (DISPOSABLE) ×3 IMPLANT
WATER STERILE IRR 1000ML POUR (IV SOLUTION) ×3 IMPLANT

## 2013-08-01 NOTE — Anesthesia Procedure Notes (Signed)
Procedure Name: LMA Insertion Date/Time: 08/01/2013 1:27 PM Performed by: Harvie JuniorGRAVES, JOHN L Pre-anesthesia Checklist: Patient identified, Emergency Drugs available, Suction available and Patient being monitored Patient Re-evaluated:Patient Re-evaluated prior to inductionOxygen Delivery Method: Circle System Utilized Preoxygenation: Pre-oxygenation with 100% oxygen Intubation Type: IV induction Ventilation: Mask ventilation without difficulty LMA: LMA inserted LMA Size: 5.0 Number of attempts: 1 Airway Equipment and Method: bite block Placement Confirmation: positive ETCO2 and breath sounds checked- equal and bilateral Tube secured with: Tape Dental Injury: Teeth and Oropharynx as per pre-operative assessment

## 2013-08-01 NOTE — Anesthesia Postprocedure Evaluation (Signed)
  Anesthesia Post-op Note  Patient: Clinton Fisher  Procedure(s) Performed: Procedure(s): ARTHROSCOPY RIGHT KNEE, Chondroplasty, Excision of Plica (Right)  Patient Location: PACU  Anesthesia Type:General  Level of Consciousness: awake, alert , oriented and patient cooperative  Airway and Oxygen Therapy: Patient Spontanous Breathing  Post-op Pain: mild  Post-op Assessment: Post-op Vital signs reviewed, Patient's Cardiovascular Status Stable, Respiratory Function Stable, Patent Airway, No signs of Nausea or vomiting and Pain level controlled  Post-op Vital Signs: Reviewed and stable  Complications: No apparent anesthesia complications

## 2013-08-01 NOTE — Transfer of Care (Signed)
Immediate Anesthesia Transfer of Care Note  Patient: Clinton Fisher  Procedure(s) Performed: Procedure(s): ARTHROSCOPY RIGHT KNEE, Chondroplasty, Excision of Plica (Right)  Patient Location: PACU  Anesthesia Type:General  Level of Consciousness: awake, sedated and responds to stimulation  Airway & Oxygen Therapy: Patient Spontanous Breathing and Patient connected to face mask oxygen  Post-op Assessment: Report given to PACU RN, Post -op Vital signs reviewed and stable and Patient moving all extremities  Post vital signs: Reviewed and stable  Complications: No apparent anesthesia complications

## 2013-08-01 NOTE — Brief Op Note (Signed)
08/01/2013  2:43 PM  PATIENT:  Rennis Pettyanner S Hunsberger  23 y.o. male  PRE-OPERATIVE DIAGNOSIS:  chondromalacia patella right knee  POST-OPERATIVE DIAGNOSIS:  Chondromalacia Patella Right Knee, Plica  PROCEDURE:  Procedure(s): ARTHROSCOPY RIGHT KNEE, Chondroplasty, Excision of Plica (Right)  SURGEON:  Surgeon(s) and Role:    * Harvie JuniorJohn L Lafayette Dunlevy, MD - Primary  PHYSICIAN ASSISTANT:   ASSISTANTS: bethune   ANESTHESIA:   none  EBL:  Total I/O In: 1200 [I.V.:1200] Out: -   BLOOD ADMINISTERED:none  DRAINS: none   LOCAL MEDICATIONS USED:  MARCAINE     SPECIMEN:  No Specimen  DISPOSITION OF SPECIMEN:  N/A  COUNTS:  YES  TOURNIQUET:  * No tourniquets in log *  DICTATION: .Other Dictation: Dictation Number [id number]  PLAN OF CARE: Discharge to home after PACU  PATIENT DISPOSITION:  PACU - hemodynamically stable.   Delay start of Pharmacological VTE agent (>24hrs) due to surgical blood loss or risk of bleeding: no

## 2013-08-01 NOTE — Anesthesia Preprocedure Evaluation (Signed)
Anesthesia Evaluation  Patient identified by MRN, date of birth, ID band Patient awake    Reviewed: Allergy & Precautions, H&P , NPO status , Patient's Chart, lab work & pertinent test results  Airway Mallampati: II  Neck ROM: full    Dental   Pulmonary neg pulmonary ROS,          Cardiovascular negative cardio ROS      Neuro/Psych    GI/Hepatic GERD-  ,  Endo/Other    Renal/GU      Musculoskeletal   Abdominal   Peds  Hematology   Anesthesia Other Findings   Reproductive/Obstetrics                           Anesthesia Physical Anesthesia Plan  ASA: I  Anesthesia Plan: General   Post-op Pain Management:    Induction: Intravenous  Airway Management Planned: LMA  Additional Equipment:   Intra-op Plan:   Post-operative Plan:   Informed Consent: I have reviewed the patients History and Physical, chart, labs and discussed the procedure including the risks, benefits and alternatives for the proposed anesthesia with the patient or authorized representative who has indicated his/her understanding and acceptance.     Plan Discussed with: CRNA, Anesthesiologist and Surgeon  Anesthesia Plan Comments:         Anesthesia Quick Evaluation

## 2013-08-01 NOTE — Discharge Instructions (Signed)

## 2013-08-01 NOTE — H&P (Signed)
  PREOPERATIVE H&P  Chief Complaint: r knee pain  HPI: Clinton Fisher is a 23 y.o. male who presents for evaluation of r knee pain. It has been present for greater than 6 months and has been worsening. He has failed conservative measures. Pain is rated as moderate.  Past Medical History  Diagnosis Date  . GERD (gastroesophageal reflux disease)     no current med.  . 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 Seasonal allergies     sore throat 07/29/2013  . Abrasion of index finger 07/29/2013    left   Past Surgical History  Procedure Laterality Date  . Foot foreign body removal Right 01/19/2011   History   Social History  . Marital Status: Single    Spouse Name: N/A    Number of Children: N/A  . Years of Education: N/A   Social History Main Topics  . Smoking status: Never Smoker   . Smokeless tobacco: Never Used  . Alcohol Use: Yes     Comment: every 3 days  . Drug Use: No  . Sexual Activity: None   Other Topics Concern  . None   Social History Narrative  . None   History reviewed. No pertinent family history. No Known Allergies Prior to Admission medications   Not on File     Positive ROS: none  All other systems have been reviewed and were otherwise negative with the exception of those mentioned in the HPI and as above.  Physical Exam: Filed Vitals:   08/01/13 1203  BP: 124/78  Pulse: 62  Temp: 97.7 F (36.5 C)  Resp: 20    General: Alert, no acute distress Cardiovascular: No pedal edema Respiratory: No cyanosis, no use of accessory musculature GI: No organomegaly, abdomen is soft and non-tender Skin: No lesions in the area of chief complaint Neurologic: Sensation intact distally Psychiatric: Patient is competent for consent with normal mood and affect Lymphatic: No axillary or cervical lymphadenopathy  MUSCULOSKELETAL: r knee +med jt line pain // + inhibition and grind  Assessment/Plan: chondromalacia patella right knee Plan for  Procedure(s): ARTHROSCOPY RIGHT KNEE  The risks benefits and alternatives were discussed with the patient including but not limited to the risks of nonoperative treatment, versus surgical intervention including infection, bleeding, nerve injury, malunion, nonunion, hardware prominence, hardware failure, need for hardware removal, blood clots, cardiopulmonary complications, morbidity, mortality, among others, and they were willing to proceed.  Predicted outcome is good, although there will be at least a six to nine month expected recovery.  Clinton Fisher L, MD 08/01/2013 1:18 PM

## 2013-08-03 NOTE — Op Note (Signed)
NAME:  Clinton Fisher, Clinton Fisher               ACCOUNT NO.:  [id number]  MEDICAL RECORD NO.:  [id number]  LOCATION:                               FACILITY:  [facility]  PHYSICIAN:  Harvie JuniorJohn L. Nicholaos Schippers, M.D.   DATE OF BIRTH:  Mar 07, 1991  DATE OF PROCEDURE:  08/01/2013 DATE OF DISCHARGE:  08/01/2013                              OPERATIVE REPORT   PREOPERATIVE DIAGNOSIS:  Right knee pain with suspected chondromalacia of the medial and lateral plica.  POSTOPERATIVE DIAGNOSIS:  Right knee pain with suspected chondromalacia of the medial and lateral plica.  PRINCIPAL PROCEDURE: 1. Chondroplasty of patellofemoral joint.     a.     Medial and lateral plica excision.  SURGEON:  Harvie JuniorJohn L. Niasha Devins, M.D.  ASSISTANT:  Marshia LyJames Bethune, P.A.  ANESTHESIA:  General.  BRIEF HISTORY:  [name] is a 23 year old male with long history of having significant complaints of right knee pain.  He had been treated conservatively for prolonged period of time.  After failure of all conservative care, he was taken to the operating room for right knee arthroscopy.  He had done great with an injection.  MRI had been obtained preoperatively, which did not show any chondral or meniscal injury, but we felt that because of his prolonged pain and failure of all conservative care, the arthroscopy was warranted and he was brought to the operating room for this procedure.  DESCRIPTION OF PROCEDURE:  The patient was brought to the operating room and after adequate anesthesia was obtained with general anesthetic, the patient was placed supine on the operating table.  The right leg was then prepped and draped in usual sterile fashion.  Following this, routine arthroscopic examination of the knee revealed there was significant synovial proliferation around the patellofemoral joint. Once this was resected, the patellofemoral joint was identified, noted to have midline tracking.  The articular surfaces were pristine. Attention was turned  medially where the large medial plica was draping the medial femoral condyle and blocking access into the medial compartment.  This was debrided back to the smooth and stable rim of tissue.  Attention was then turned to the ACL, which was normal. Attention was turned to the medial compartment where the medial femoral condyle was probed at length and had no soft areas.  Medial meniscus was normal.  Attention was turned to the lateral side where the lateral meniscus was normal, and lateral femoral condyle was normal.  When back up into the patellofemoral joint, had look at the lateral side, the lateral side had a fairly significant synovial proliferation, essentially a lateral plica and this was debrided back to a rim of the lateral side.  At this point, we explored around the joint for any looser fragment and piece of cartilage sitting.  None of the lateral patellar facet had some fairly significant chondromalacia, which was debrided back to a smooth rim.  At this point, the knee was copiously and thoroughly lavaged, suctioned dry.  Some portals were closed with bandage.  Sterile compressive dressing was applied.  The patient was taken to the recovery room, he was noted to be in satisfactory condition.  Estimated blood loss for the procedure was none.  Harvie Junior, M.D.     Ranae Plumber  D:  08/01/2013  T:  08/02/2013  Job:  [id number]

## 2013-08-04 ENCOUNTER — Encounter (HOSPITAL_BASED_OUTPATIENT_CLINIC_OR_DEPARTMENT_OTHER): Payer: Self-pay | Admitting: Orthopedic Surgery

## 2013-08-04 LAB — POCT HEMOGLOBIN-HEMACUE: HEMOGLOBIN: 13.8 g/dL (ref 13.0–17.0)

## 2013-09-09 ENCOUNTER — Encounter (HOSPITAL_COMMUNITY): Payer: Self-pay | Admitting: Emergency Medicine

## 2013-09-09 ENCOUNTER — Emergency Department (HOSPITAL_COMMUNITY)
Admission: EM | Admit: 2013-09-09 | Discharge: 2013-09-09 | Disposition: A | Discharge status: Home / Self Care | Payer: 59 | Source: Home / Self Care | Attending: Emergency Medicine | Admitting: Emergency Medicine

## 2013-09-09 DIAGNOSIS — R6889 Other general symptoms and signs: Secondary | ICD-10-CM

## 2013-09-09 DIAGNOSIS — H6692 Otitis media, unspecified, left ear: Secondary | ICD-10-CM

## 2013-09-09 DIAGNOSIS — H6690 Otitis media, unspecified, unspecified ear: Secondary | ICD-10-CM

## 2013-09-09 MED ORDER — BENZONATATE 200 MG PO CAPS: 200.0000 mg | ORAL_CAPSULE | Freq: Three times a day (TID) | ORAL | Status: DC | PRN

## 2013-09-09 MED ORDER — AZITHROMYCIN 250 MG PO TABS: 250.0000 mg | ORAL_TABLET | Freq: Every day | ORAL | Status: DC

## 2013-09-09 NOTE — ED Provider Notes (Signed)
CSN: [id number]     Arrival date & time 09/09/13  1641 History   First MD Initiated Contact with Patient 09/09/13 1750     No chief complaint on file.    (Consider location/radiation/quality/duration/timing/severity/associated sxs/prior Treatment) Patient is a 23 y.o. male presenting with URI. The history is provided by the patient.  URI Presenting symptoms: congestion, cough, ear pain, fever, rhinorrhea and sore throat   Severity:  Moderate Onset quality:  Gradual Duration:  24 hours Timing:  Constant Progression:  Worsening Chronicity:  New Relieved by:  Nothing Worsened by:  Drinking and eating Ineffective treatments:  OTC medications Associated symptoms: myalgias and sinus pain   Associated symptoms: no headaches   Risk factors: no chronic cardiac disease, no chronic kidney disease, no chronic respiratory disease, no diabetes mellitus, no recent travel and no sick contacts    JAZION ATTEBERRY is a 22 y.o. male who presents to the ED with cough, cold and congestion that started yesterday and has gotten much worse today with a bad sore throat.   Past Medical History  Diagnosis Date  . GERD (gastroesophageal reflux disease)     no current med.  . 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 Seasonal allergies     sore throat 07/29/2013  . Abrasion of index finger 07/29/2013    left   Past Surgical History  Procedure Laterality Date  . Foot foreign body removal Right 01/19/2011  . Knee arthroscopy Right 08/01/2013    Procedure: ARTHROSCOPY RIGHT KNEE, Chondroplasty, Excision of Plica;  Surgeon: Harvie Junior, MD;  Location: [facility];  Service: Orthopedics;  Laterality: Right;   No family history on file. History  Substance Use Topics  . Smoking status: Never Smoker   . Smokeless tobacco: Never Used  . Alcohol Use: Yes     Comment: every 3 days    Review of Systems  Constitutional: Positive for fever and chills.  HENT: Positive for congestion, ear  pain, rhinorrhea, sinus pressure and sore throat. Negative for trouble swallowing.   Eyes: Negative for photophobia, redness and itching.  Respiratory: Positive for cough. Negative for chest tightness.   Cardiovascular: Negative for chest pain.  Gastrointestinal: Positive for nausea. Negative for vomiting, abdominal pain and diarrhea.  Genitourinary: Negative for dysuria, urgency and frequency.  Musculoskeletal: Positive for myalgias.  Skin: Negative for rash.  Neurological: Negative for dizziness, syncope and headaches.  Psychiatric/Behavioral: Negative for confusion. The patient is not nervous/anxious.       Allergies  Review of patient's allergies indicates no known allergies.  Home Medications   Current Outpatient Rx  Name  Route  Sig  Dispense  Refill  . oxyCODONE-acetaminophen (PERCOCET/ROXICET) 5-325 MG per tablet   Oral   Take 1-2 tablets by mouth every 6 (six) hours as needed for severe pain.   30 tablet   0    BP 130/68  Pulse 113  Temp(Src) 98.7 F (37.1 C) (Oral)  Resp 18  SpO2 99% Physical Exam  Nursing note and vitals reviewed. Constitutional: He is oriented to person, place, and time. He appears well-developed and well-nourished. No distress.  HENT:  Head: Normocephalic and atraumatic.  Right Ear: Tympanic membrane normal.  Left Ear: Tympanic membrane is erythematous.  Nose: Rhinorrhea present. Right sinus exhibits maxillary sinus tenderness. Left sinus exhibits maxillary sinus tenderness.  Mouth/Throat: Uvula is midline and mucous membranes are normal. Posterior oropharyngeal erythema present.  Eyes: EOM are normal.  Neck: Neck supple.  Cardiovascular: Tachycardia present.   Pulmonary/Chest: Effort normal.  Abdominal: Soft. There is no tenderness.  Musculoskeletal: Normal range of motion.  Neurological: He is alert and oriented to person, place, and time. No cranial nerve deficit.  Skin: Skin is warm and dry.  Psychiatric: He has a normal mood and  affect. His behavior is normal.    ED Course  Procedures   MDM  23 y.o. male with sore throat, cough, sinus pressure and ear pain x 24 hours. Will treat otitis and cough. He will follow up with his PCP. He will return here as needed. Stable for discharge with O2 SAT 99% on R/A and stable VS.    Janne NapoleonHope M Esha Fincher, NP 09/09/13 1810

## 2013-09-09 NOTE — ED Notes (Signed)
C/o sore throat that is extreme onset yesterday.  Up since 0100 due to throat pain.  C/o chills and a little fever.  No body aches. C/o headache, back of nostrils are raw.

## 2013-09-10 NOTE — ED Provider Notes (Signed)
Medical screening examination/treatment/procedure(s) were performed by non-physician practitioner and as supervising physician I was immediately available for consultation/collaboration.  Leslee Homeavid Wenda Vanschaick, M.D.  Reuben Likesavid C Shianna Bally, MD 09/10/13 [phone]

## 2015-02-12 ENCOUNTER — Emergency Department (HOSPITAL_COMMUNITY)
Admission: EM | Admit: 2015-02-12 | Discharge: 2015-02-12 | Disposition: A | Discharge status: Home / Self Care | Payer: 59 | Attending: Emergency Medicine | Admitting: Emergency Medicine

## 2015-02-12 ENCOUNTER — Emergency Department (HOSPITAL_COMMUNITY): Payer: 59

## 2015-02-12 ENCOUNTER — Encounter (HOSPITAL_COMMUNITY): Payer: Self-pay | Admitting: *Deleted

## 2015-02-12 DIAGNOSIS — R079 Chest pain, unspecified: Secondary | ICD-10-CM | POA: Diagnosis present

## 2015-02-12 DIAGNOSIS — R0602 Shortness of breath: Secondary | ICD-10-CM | POA: Insufficient documentation

## 2015-02-12 DIAGNOSIS — Z8719 Personal history of other diseases of the digestive system: Secondary | ICD-10-CM | POA: Diagnosis not present

## 2015-02-12 DIAGNOSIS — Z8739 Personal history of other diseases of the musculoskeletal system and connective tissue: Secondary | ICD-10-CM | POA: Insufficient documentation

## 2015-02-12 DIAGNOSIS — Z79899 Other long term (current) drug therapy: Secondary | ICD-10-CM | POA: Insufficient documentation

## 2015-02-12 DIAGNOSIS — Z87828 Personal history of other (healed) physical injury and trauma: Secondary | ICD-10-CM | POA: Diagnosis not present

## 2015-02-12 LAB — CBC
HEMATOCRIT: 46.1 % (ref 39.0–52.0)
HEMOGLOBIN: 16 g/dL (ref 13.0–17.0)
MCH: 30.1 pg (ref 26.0–34.0)
MCHC: 34.7 g/dL (ref 30.0–36.0)
MCV: 86.8 fL (ref 78.0–100.0)
Platelets: 212 10*3/uL (ref 150–400)
RBC: 5.31 MIL/uL (ref 4.22–5.81)
RDW: 12.3 % (ref 11.5–15.5)
WBC: 7.9 10*3/uL (ref 4.0–10.5)

## 2015-02-12 LAB — BASIC METABOLIC PANEL
Anion gap: 11 (ref 5–15)
BUN: 9 mg/dL (ref 6–20)
CALCIUM: 8.9 mg/dL (ref 8.9–10.3)
CO2: 23 mmol/L (ref 22–32)
CREATININE: 0.73 mg/dL (ref 0.61–1.24)
Chloride: 103 mmol/L (ref 101–111)
GFR calc Af Amer: >60 mL/min (ref >60)
GLUCOSE: 112 mg/dL — AB (ref 65–99)
Potassium: 3.7 mmol/L (ref 3.5–5.1)
SODIUM: 137 mmol/L (ref 135–145)

## 2015-02-12 LAB — I-STAT TROPONIN, ED: Troponin i, poc: 0 ng/mL (ref 0.00–0.08)

## 2015-02-12 NOTE — ED Provider Notes (Signed)
CSN: [id number]     Arrival date & time 02/12/15  0236 History  This chart was scribed for Tomasita Crumble, MD by Freida Busman, ED Scribe. This patient was seen in room B16C/B16C and the patient's care was started 3:01 AM.    Chief Complaint  Patient presents with  . Chest Pain   The history is provided by the patient. No language interpreter was used.     HPI Comments:  Clinton Fisher is a 24 y.o. male who presents to the Emergency Department complaining of central CP that started ~0100 this am while laying down to go to sleep. This is his 3rd episode in the last 7 days. He has had chest pain like this since he was 24 years old, normally it occurs once a month. His pain waxes and wanes in severity going from a dull pain to a sharp, squeezing pain. Pt reports a history of the same but notes he has never been evaluated by a cardiologist. He reports associated SOB when pain is high. His pain is random and can be brought on by anything or nothing at all. Pt started taking Sertraline for anxiety ~1 week ago, who states this is when the pain frequency increased.Marland Kitchen He denies nausea, vomiting and diaphoresis. He also denies recent travel, recent surgery. No alleviating factors noted.  PCP: Manus Gunning  Past Medical History  Diagnosis Date  . GERD (gastroesophageal reflux disease)     no current med.  . 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 Seasonal allergies     sore throat 07/29/2013  . Abrasion of index finger 07/29/2013    left   Past Surgical History  Procedure Laterality Date  . Foot foreign body removal Right 01/19/2011  . Knee arthroscopy Right 08/01/2013    Procedure: ARTHROSCOPY RIGHT KNEE, Chondroplasty, Excision of Plica;  Surgeon: Harvie Junior, MD;  Location: [facility];  Service: Orthopedics;  Laterality: Right;   History reviewed. No pertinent family history. History  Substance Use Topics  . Smoking status: Never Smoker   . Smokeless tobacco: Never Used  .  Alcohol Use: Yes     Comment: every 3 days    Review of Systems  A complete 10 system review of systems was obtained and all systems are negative except as noted in the HPI and PMH.    Allergies  Review of patient's allergies indicates no known allergies.  Home Medications   Prior to Admission medications   Medication Sig Start Date End Date Taking? Authorizing Provider  acetaminophen (TYLENOL) 500 MG tablet Take 1,000 mg by mouth every 6 (six) hours as needed for mild pain or headache.    Yes Historical Provider, MD  sertraline (ZOLOFT) 50 MG tablet Take 50 mg by mouth daily.   Yes Historical Provider, MD   BP 132/80 mmHg  Pulse 69  Temp(Src) 98.7 F (37.1 C) (Oral)  Resp 18  SpO2 98% Physical Exam  Constitutional: He is oriented to person, place, and time. Vital signs are normal. He appears well-developed and well-nourished.  Non-toxic appearance. He does not appear ill. No distress.  HENT:  Head: Normocephalic and atraumatic.  Nose: Nose normal.  Mouth/Throat: Oropharynx is clear and moist. No oropharyngeal exudate.  Eyes: Conjunctivae and EOM are normal. Pupils are equal, round, and reactive to light. No scleral icterus.  Neck: Normal range of motion. Neck supple. No tracheal deviation, no edema, no erythema and normal range of motion present. No thyroid mass  and no thyromegaly present.  Cardiovascular: Normal rate, regular rhythm, S1 normal, S2 normal, normal heart sounds, intact distal pulses and normal pulses.  Exam reveals no gallop and no friction rub.   No murmur heard. Pulses:      Radial pulses are 2+ on the right side, and 2+ on the left side.       Dorsalis pedis pulses are 2+ on the right side, and 2+ on the left side.  Pulmonary/Chest: Effort normal and breath sounds normal. No respiratory distress. He has no wheezes. He has no rhonchi. He has no rales.  Abdominal: Soft. Normal appearance and bowel sounds are normal. He exhibits no distension, no ascites and no  mass. There is no hepatosplenomegaly. There is no tenderness. There is no rebound, no guarding and no CVA tenderness.  Musculoskeletal: Normal range of motion. He exhibits no edema or tenderness.  Lymphadenopathy:    He has no cervical adenopathy.  Neurological: He is alert and oriented to person, place, and time. He has normal strength. No cranial nerve deficit or sensory deficit.  Skin: Skin is warm, dry and intact. No petechiae and no rash noted. He is not diaphoretic. No erythema. No pallor.  Psychiatric: He has a normal mood and affect. His behavior is normal. Judgment normal.  Nursing note and vitals reviewed.   ED Course  Procedures   DIAGNOSTIC STUDIES:  Oxygen Saturation is 98% on RA, normal by my interpretation.    COORDINATION OF CARE:  3:24 AM Discussed treatment plan with pt at bedside and pt agreed to plan.  Labs Review Labs Reviewed  BASIC METABOLIC PANEL - Abnormal; Notable for the following:    Glucose, Bld 112 (*)    All other components within normal limits  CBC  I-STAT TROPOININ, ED    Imaging Review Dg Chest 2 View  02/12/2015   CLINICAL DATA:  Initial evaluation for the chest pain.  EXAM: CHEST  2 VIEW  COMPARISON:  Prior radiograph from 03/20/2005  FINDINGS: The cardiac and mediastinal silhouettes are stable in size and contour, and remain within normal limits.  The lungs are normally inflated. No airspace consolidation, pleural effusion, or pulmonary edema is identified. There is no pneumothorax. Ovoid density overlying the right upper lobe on frontal projection favored to lie external to the patient.  No acute osseous abnormality identified.  IMPRESSION: No active cardiopulmonary disease.   Electronically Signed   By: Rise Mu M.D.   On: 02/12/2015 04:08     EKG Interpretation   Date/Time:  Friday February 12 2015 02:41:42 EDT Ventricular Rate:  65 PR Interval:  154 QRS Duration: 82 QT Interval:  394 QTC Calculation: 409 R Axis:   53 Text  Interpretation:  Normal sinus rhythm with sinus arrhythmia Normal ECG  Confirmed by Erroll Luna [phone]) on 02/12/2015 2:46:32 AM      MDM   Final diagnoses:  None   Patient presents to the emergency department for chest pain. He describes it as tightness and with shortness of breath. He states this has happened since he was 24 years old however since he began taking sertraline 7 with increased frequency. It normally occurs once a month however this occurred 3 times this week. That history is not consistent with ACS. Troponin is negative and was ordered by the triage nurse, not clinically indicated at this time. EKG and x-ray are unremarkable. He was advised to call his doctor in regards to finding a new medication to help with his anxiety.  Sertraline can cause palpitations which she is also experiencing. He overall appears well in no acute distress. His vital signs remain within his normal limits and he is safe for discharge.  I personally performed the services described in this documentation, which was scribed in my presence. The recorded information has been reviewed and is accurate.   Tomasita Crumble, MD 02/12/15 [phone]

## 2015-02-12 NOTE — ED Notes (Signed)
Pt c/o chest pain for the past two hours. Pt reports intermittent chest pain the past month. Pt states he just started Zoloft a week ago and has noticed an increased in his chest discomfort. Pt denies n/v, diaphoresis. Reports some mild shortness of breath.

## 2015-02-12 NOTE — Discharge Instructions (Signed)
Chest Pain (Nonspecific) Clinton Fisher, the medication for your anxiety may be causing worsening chest pain. Speak with your primary care physician regarding different medication you can take. If any symptoms worsen come back to the emergency department immediately. Thank you. It is often hard to give a diagnosis for the cause of chest pain. There is always a chance that your pain could be related to something serious, such as a heart attack or a blood clot in the lungs. You need to follow up with your doctor. HOME CARE  If antibiotic medicine was given, take it as directed by your doctor. Finish the medicine even if you start to feel better.  For the next few days, avoid activities that bring on chest pain. Continue physical activities as told by your doctor.  Do not use any tobacco products. This includes cigarettes, chewing tobacco, and e-cigarettes.  Avoid drinking alcohol.  Only take medicine as told by your doctor.  Follow your doctor's suggestions for more testing if your chest pain does not go away.  Keep all doctor visits you made. GET HELP IF:  Your chest pain does not go away, even after treatment.  You have a rash with blisters on your chest.  You have a fever. GET HELP RIGHT AWAY IF:   You have more pain or pain that spreads to your arm, neck, jaw, back, or belly (abdomen).  You have shortness of breath.  You cough more than usual or cough up blood.  You have very bad back or belly pain.  You feel sick to your stomach (nauseous) or throw up (vomit).  You have very bad weakness.  You pass out (faint).  You have chills. This is an emergency. Do not wait to see if the problems will go away. Call your local emergency services (911 in [place] Do not drive yourself to the hospital. MAKE SURE YOU:   Understand these instructions.  Will watch your condition.  Will get help right away if you are not doing well or get worse. Document Released: 12/27/2007 Document  Revised: 07/15/2013 Document Reviewed: 12/27/2007 [facility] Patient Information 2015 [place], [place]. This information is not intended to replace advice given to you by your health care provider. Make sure you discuss any questions you have with your health care provider.

## 2015-02-12 NOTE — ED Notes (Signed)
Pt left with all belongings and refused wheelchair. 

## 2016-01-08 ENCOUNTER — Ambulatory Visit (HOSPITAL_COMMUNITY)
Admission: EM | Admit: 2016-01-08 | Discharge: 2016-01-08 | Disposition: A | Discharge status: Home / Self Care | Payer: 59 | Attending: Emergency Medicine | Admitting: Emergency Medicine

## 2016-01-08 ENCOUNTER — Encounter (HOSPITAL_COMMUNITY): Payer: Self-pay | Admitting: Emergency Medicine

## 2016-01-08 DIAGNOSIS — N489 Disorder of penis, unspecified: Secondary | ICD-10-CM

## 2016-01-08 DIAGNOSIS — N4889 Other specified disorders of penis: Secondary | ICD-10-CM

## 2016-01-08 MED ORDER — DOXYCYCLINE HYCLATE 100 MG PO CAPS: 100.0000 mg | ORAL_CAPSULE | Freq: Two times a day (BID) | ORAL | Status: DC

## 2016-01-08 NOTE — ED Notes (Signed)
Patient reports a "sebaceous cyst".  Patient reports having a cyst in the past and was treated with antibiotics.  Patient reports current "cyst" for 6 months and is getting larger and described a a "quarter-sized" on the penis.

## 2016-01-08 NOTE — ED Provider Notes (Signed)
CSN: [id number]     Arrival date & time 01/08/16  1301 History   First MD Initiated Contact with Patient 01/08/16 1349     Chief Complaint  Patient presents with  . Cyst   (Consider location/radiation/quality/duration/timing/severity/associated sxs/prior Treatment) Patient is a 25 y.o. male presenting with penile discharge. The history is provided by the patient. No language interpreter was used.  Penile Discharge This is a new problem. Episode onset: 6 months. The problem occurs constantly. The problem has been gradually worsening. Nothing aggravates the symptoms. Nothing relieves the symptoms. He has tried nothing for the symptoms. The treatment provided no relief.  Pt reports he has had a lump/cyst on the side of his penis for 6 months.  Pt reports area seemed to not change and then it started increasing in size.  Pt reports now swollen glands in groin area  Past Medical History  Diagnosis Date  . GERD (gastroesophageal reflux disease)     no current med.  . 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 Seasonal allergies     sore throat 07/29/2013  . Abrasion of index finger 07/29/2013    left   Past Surgical History  Procedure Laterality Date  . Foot foreign body removal Right 01/19/2011  . Knee arthroscopy Right 08/01/2013    Procedure: ARTHROSCOPY RIGHT KNEE, Chondroplasty, Excision of Plica;  Surgeon: Harvie JuniorJohn L Graves, MD;  Location: [facility];  Service: Orthopedics;  Laterality: Right;   No family history on file. Social History  Substance Use Topics  . Smoking status: Never Smoker   . Smokeless tobacco: Never Used  . Alcohol Use: Yes     Comment: every 3 days    Review of Systems  Genitourinary: Positive for discharge.  All other systems reviewed and are negative.   Allergies  Review of patient's allergies indicates no known allergies.  Home Medications   Prior to Admission medications   Medication Sig Start Date End Date Taking? Authorizing  Provider  acetaminophen (TYLENOL) 500 MG tablet Take 1,000 mg by mouth every 6 (six) hours as needed for mild pain or headache.     Historical Provider, MD  doxycycline (VIBRAMYCIN) 100 MG capsule Take 1 capsule (100 mg total) by mouth 2 (two) times daily. 01/08/16   Elson AreasLeslie K Hulda Reddix, PA-C  sertraline (ZOLOFT) 50 MG tablet Take 50 mg by mouth daily.    Historical Provider, MD   Meds Ordered and Administered this Visit  Medications - No data to display  BP 128/80 mmHg  Pulse 82  Temp(Src) 98.3 F (36.8 C) (Oral)  Resp 18  SpO2 99% No data found.   Physical Exam  Constitutional: He is oriented to person, place, and time. He appears well-developed and well-nourished.  Genitourinary: Penis normal.  5mm swollen area right lateral penis  Small open area center,  Small amount of drainage.  bilat inguinal lymphadopathy.   Musculoskeletal: Normal range of motion.  Neurological: He is alert and oriented to person, place, and time. He has normal reflexes.  Skin: Skin is warm.  Psychiatric: He has a normal mood and affect.  Nursing note and vitals reviewed.   ED Course  Procedures (including critical care time)  Labs Review Labs Reviewed - No data to display  Imaging Review No results found.   Visual Acuity Review  Right Eye Distance:   Left Eye Distance:   Bilateral Distance:    Right Eye Near:   Left Eye Near:    Bilateral  Near:         MDM I counsel pt.   I do not know what this lesion/area is. This does not appear herpetic, It does not look like a cancroid,  I do not think  this is is std related   I will cover for infection however I advised him he needs to see urology.  This possibly could be a sebacous cyst however I feel he needs to see specialist.    Pt understands and agrees to call urology on Monday to be seen   1. Penile lesion    An After Visit Summary was printed and given to the patient.  Meds ordered this encounter  Medications  . DISCONTD: doxycycline  (VIBRAMYCIN) 100 MG capsule    Sig: Take 1 capsule (100 mg total) by mouth 2 (two) times daily.    Dispense:  20 capsule    Refill:  0    Order Specific Question:  Supervising Provider    Answer:  Linna Hoff [phone]  . doxycycline (VIBRAMYCIN) 100 MG capsule    Sig: Take 1 capsule (100 mg total) by mouth 2 (two) times daily.    Dispense:  20 capsule    Refill:  0    Order Specific Question:  Supervising Provider    Answer:  Bradd Canary D [[id number]]    Lonia Skinner Cannon Ball, PA-C 01/08/16 1622

## 2016-01-08 NOTE — Discharge Instructions (Signed)

## 2016-05-01 ENCOUNTER — Encounter (HOSPITAL_COMMUNITY): Payer: Self-pay | Admitting: *Deleted

## 2016-05-01 ENCOUNTER — Emergency Department (HOSPITAL_COMMUNITY): Payer: No Typology Code available for payment source

## 2016-05-01 ENCOUNTER — Emergency Department (HOSPITAL_COMMUNITY)
Admission: EM | Admit: 2016-05-01 | Discharge: 2016-05-01 | Disposition: A | Discharge status: Home / Self Care | Payer: No Typology Code available for payment source | Attending: Emergency Medicine | Admitting: Emergency Medicine

## 2016-05-01 DIAGNOSIS — S0990XA Unspecified injury of head, initial encounter: Secondary | ICD-10-CM

## 2016-05-01 DIAGNOSIS — Y9389 Activity, other specified: Secondary | ICD-10-CM | POA: Diagnosis not present

## 2016-05-01 DIAGNOSIS — F17200 Nicotine dependence, unspecified, uncomplicated: Secondary | ICD-10-CM | POA: Diagnosis not present

## 2016-05-01 DIAGNOSIS — Y999 Unspecified external cause status: Secondary | ICD-10-CM | POA: Diagnosis not present

## 2016-05-01 DIAGNOSIS — S6991XA Unspecified injury of right wrist, hand and finger(s), initial encounter: Secondary | ICD-10-CM | POA: Diagnosis present

## 2016-05-01 DIAGNOSIS — S60811A Abrasion of right wrist, initial encounter: Secondary | ICD-10-CM | POA: Diagnosis not present

## 2016-05-01 DIAGNOSIS — Y92410 Unspecified street and highway as the place of occurrence of the external cause: Secondary | ICD-10-CM | POA: Diagnosis not present

## 2016-05-01 DIAGNOSIS — T148XXA Other injury of unspecified body region, initial encounter: Secondary | ICD-10-CM

## 2016-05-01 MED ORDER — METHOCARBAMOL 500 MG PO TABS: 500.0000 mg | ORAL_TABLET | Freq: Two times a day (BID) | ORAL | 0 refills | Status: DC

## 2016-05-01 MED ORDER — IBUPROFEN 800 MG PO TABS
800.0000 mg | ORAL_TABLET | Freq: Three times a day (TID) | ORAL | 0 refills | Status: DC
Start: 2016-05-01 — End: 2020-06-08

## 2016-05-01 NOTE — ED Notes (Signed)
MD at bedside. 

## 2016-05-01 NOTE — ED Notes (Signed)
From CT family at bedside
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
## 2016-05-01 NOTE — ED Notes (Signed)
Patient transported to CT 
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
## 2016-06-14 IMAGING — DX DG CHEST 2V
2 series · 2 of 2 positions shown · non-contrast
Comparison: Prior radiograph from 03/20/2005

CLINICAL DATA: Initial evaluation for the chest pain.

EXAM:
CHEST  2 VIEW

[chest pa]
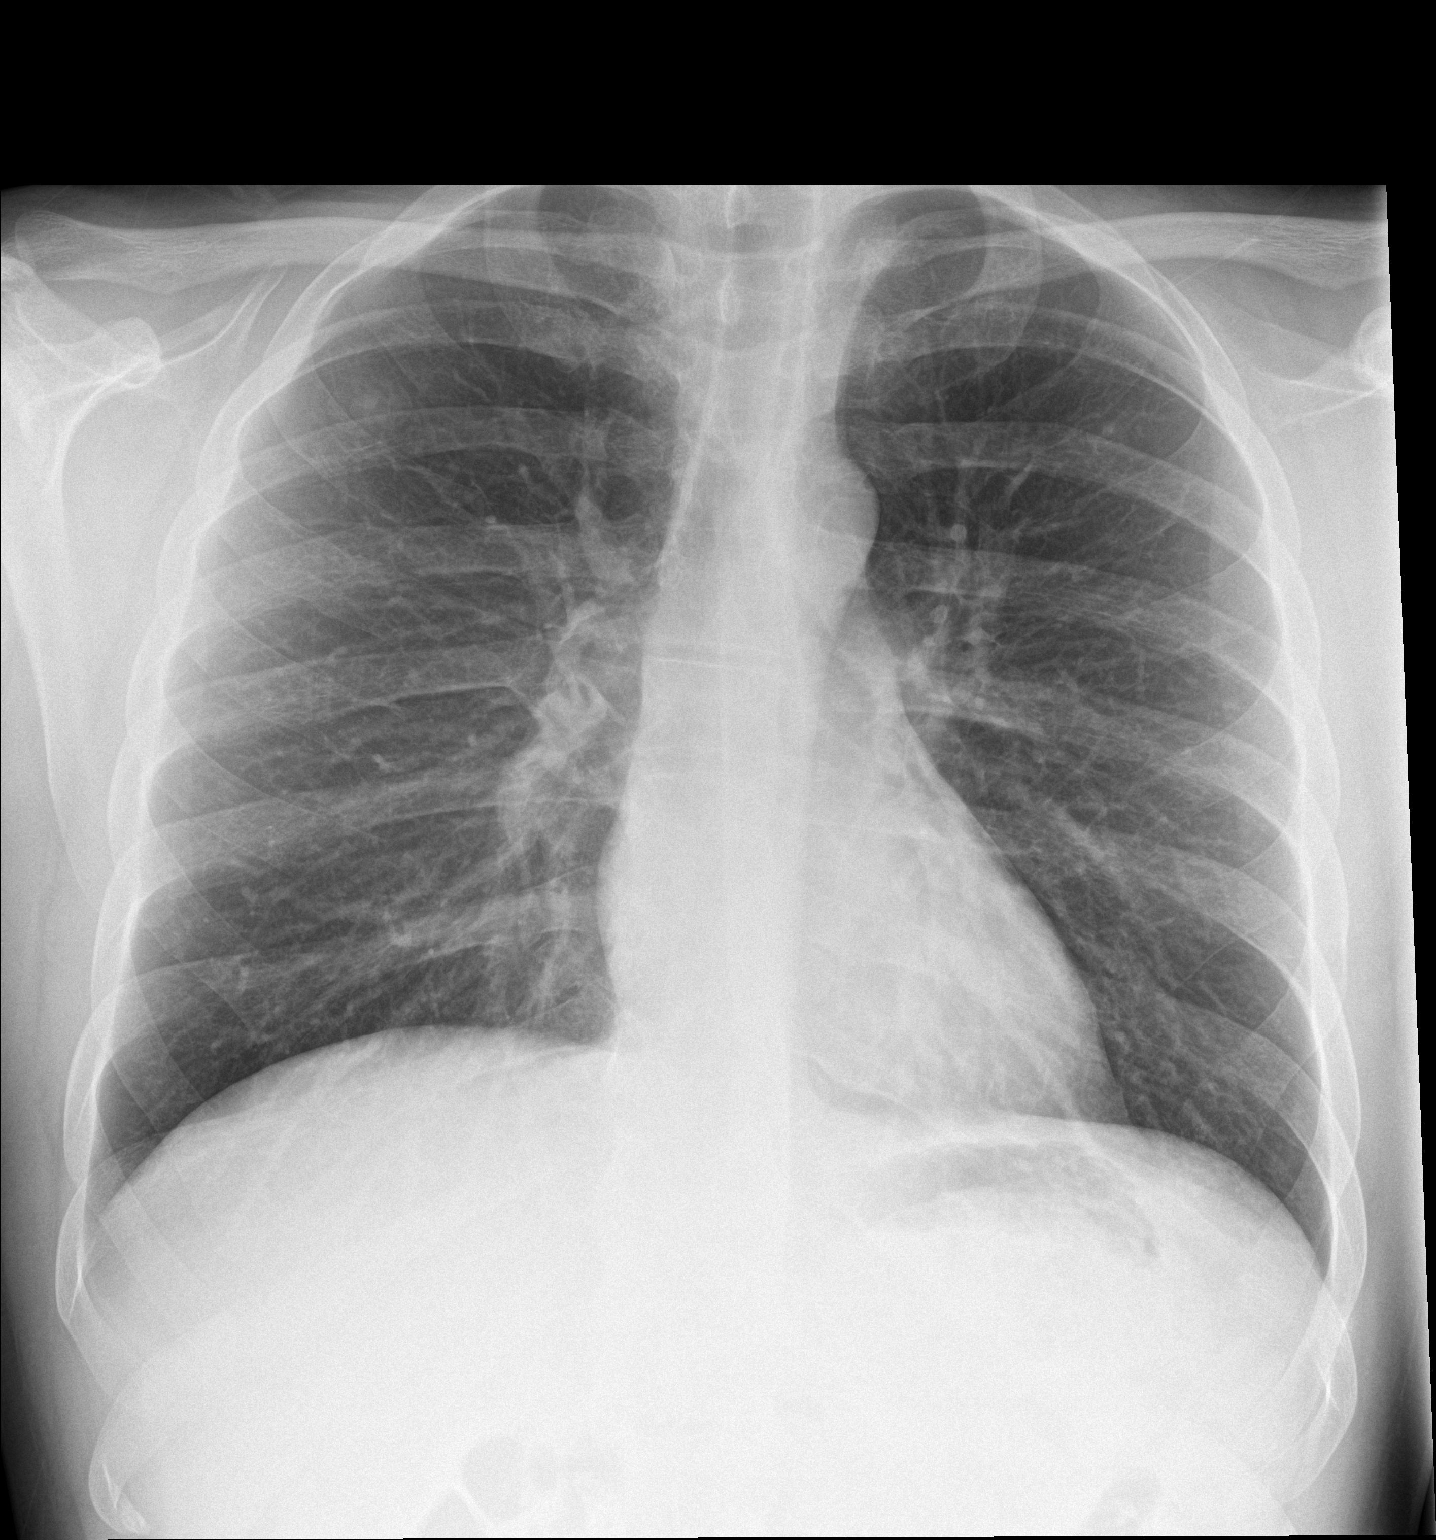

[chest lat]
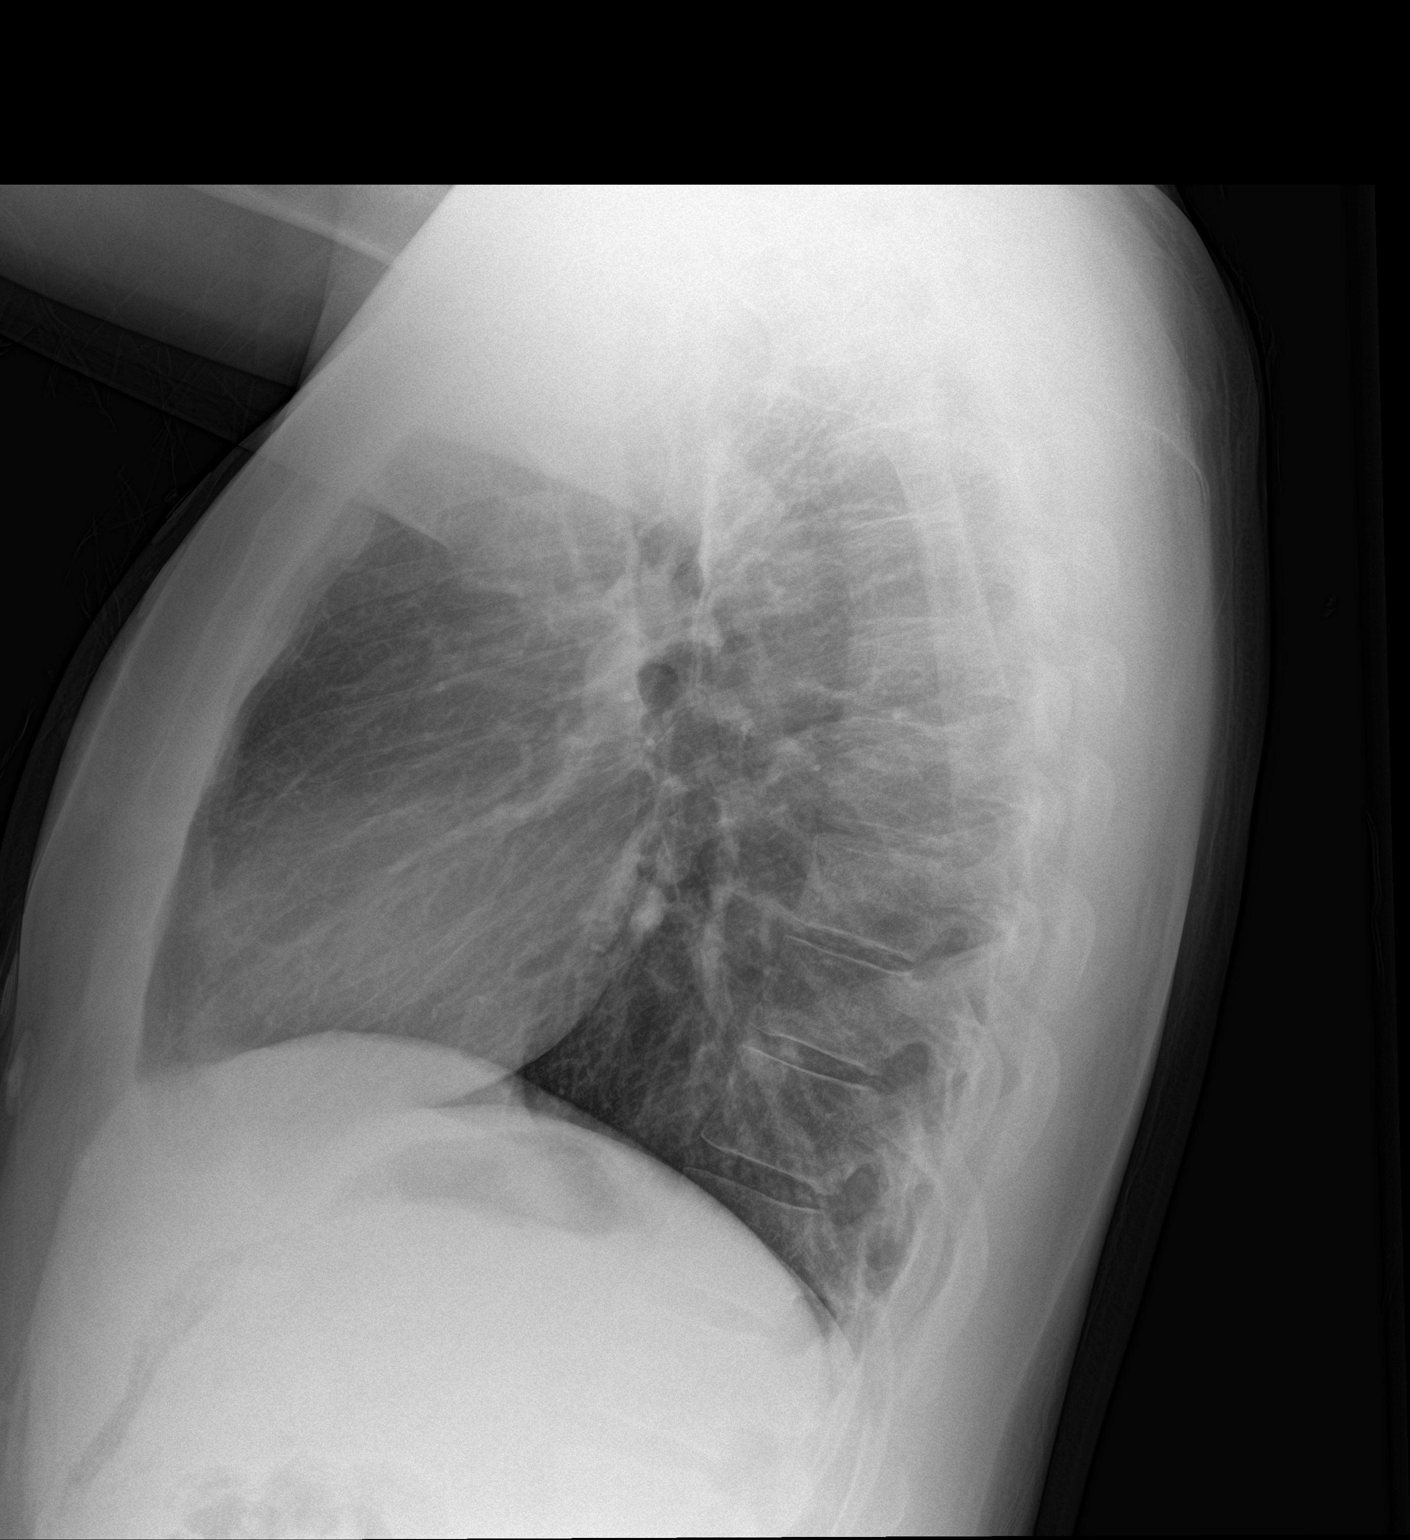

[2 of 2 positions shown; findings below may reference images not displayed]

FINDINGS: The cardiac and mediastinal silhouettes are stable in size and
contour, and remain within normal limits.

The lungs are normally inflated. No airspace consolidation, pleural
effusion, or pulmonary edema is identified. There is no
pneumothorax. Ovoid density overlying the right upper lobe on
frontal projection favored to lie external to the patient.

No acute osseous abnormality identified.
IMPRESSION: No active cardiopulmonary disease.

## 2016-09-25 DIAGNOSIS — F411 Generalized anxiety disorder: Secondary | ICD-10-CM | POA: Diagnosis present

## 2017-08-20 DIAGNOSIS — F17200 Nicotine dependence, unspecified, uncomplicated: Secondary | ICD-10-CM | POA: Diagnosis present

## 2018-05-31 ENCOUNTER — Other Ambulatory Visit (HOSPITAL_COMMUNITY): Payer: Self-pay | Admitting: Physician Assistant

## 2018-05-31 ENCOUNTER — Ambulatory Visit (HOSPITAL_COMMUNITY)
Admission: RE | Admit: 2018-05-31 | Discharge: 2018-05-31 | Disposition: A | Discharge status: Home / Self Care | Payer: Self-pay | Source: Ambulatory Visit | Attending: Physician Assistant | Admitting: Physician Assistant

## 2018-05-31 DIAGNOSIS — R059 Cough, unspecified: Secondary | ICD-10-CM

## 2018-05-31 DIAGNOSIS — R05 Cough: Secondary | ICD-10-CM | POA: Insufficient documentation

## 2018-10-18 DIAGNOSIS — F319 Bipolar disorder, unspecified: Secondary | ICD-10-CM | POA: Diagnosis present

## 2018-10-18 DIAGNOSIS — F1220 Cannabis dependence, uncomplicated: Secondary | ICD-10-CM | POA: Diagnosis present

## 2020-06-08 ENCOUNTER — Ambulatory Visit (INDEPENDENT_AMBULATORY_CARE_PROVIDER_SITE_OTHER): Payer: PRIVATE HEALTH INSURANCE | Admitting: Dermatology

## 2020-06-08 ENCOUNTER — Other Ambulatory Visit: Payer: Self-pay

## 2020-06-08 ENCOUNTER — Encounter: Payer: Self-pay | Admitting: Dermatology

## 2020-06-08 DIAGNOSIS — L219 Seborrheic dermatitis, unspecified: Secondary | ICD-10-CM

## 2020-06-08 DIAGNOSIS — L309 Dermatitis, unspecified: Secondary | ICD-10-CM | POA: Diagnosis not present

## 2020-06-08 MED ORDER — KETOCONAZOLE 2 % EX SHAM: 1.0000 "application " | MEDICATED_SHAMPOO | Freq: Once | CUTANEOUS | 3 refills | Status: AC

## 2020-06-08 NOTE — Patient Instructions (Addendum)
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 For his mild but diffuse facial pink scale I suggested he initially try a nonprescription 1% hydrocortisone ointment (the ointment may be behind the counter but is quite a bit more effective for most people than cream or lotion.) This is applied to the face sometime in the evening after supper and does not have to be washed off. Most people within 2 weeks will see improvement in the facial breaking out and then you could cut back and use it once in a while to keep under control. If the hydrocortisone fails, please contact the office by phone and we will prescribe a generic noncortisone call tacrolimus ointment which we will discuss how to use by phone. The scalp involvement generally responds to ketoconazole shampoo which could be acquired either over-the-counter and 1% or prescription 2%. Although we will send in a prescription for 2% ketoconazole, if you can find the 1% for significantly less cost than it works quite well. You may use this on the scalp every 1 to 3 days letting it sit for a few minutes before washing it out. If the shampoo does not control the scale or the itching, please contact us and we will be happy to go with a prescription. Routine follow-up can be by phone.

## 2020-06-11 ENCOUNTER — Encounter: Payer: Self-pay | Admitting: Dermatology

## 2020-06-11 NOTE — Progress Notes (Signed)
   Follow-Up Visit   Subjective  Clinton Fisher is a 29 y.o. male who presents for the following: Psoriasis (scalp & face- tx- ketaconazole shampoo).  rash Location: face and scalp Duration:  Quality:  Associated Signs/Symptoms: Modifying Factors:  Severity:  Timing: Context:   Objective  Well appearing patient in no apparent distress; mood and affect are within normal limits.  A focused examination was performed of the head and neck.   Assessment & Plan    Dermatitis Scalp  Clinton Fisher will call if his current medications do not achieve more improvement.  He may go either to over-the-counter 1% hydrocortisone ointment or we will phone in for 0.1% tacrolimus ointment.   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 For his mild but diffuse facial pink scale I suggested he initially try a nonprescription 1% hydrocortisone ointment (the ointment may be behind the counter but is quite a bit more effective for most people than cream or lotion.) This is applied to the face sometime in the evening after supper and does not have to be washed off. Most people within 2 weeks will see improvement in the facial breaking out and then you could cut back and use it once in a while to keep under control. If the hydrocortisone fails, please contact the office by phone and we will prescribe a generic noncortisone call tacrolimus ointment which we will discuss how to use by phone. The scalp involvement generally responds to ketoconazole shampoo which could be acquired either over-the-counter and 1% or prescription 2%. Although we will send in a prescription for 2% ketoconazole, if you can find the 1% for significantly less cost than it works quite well. You may use this on the scalp every 1 to 3 days letting it sit for a few minutes before washing it out. If the shampoo does not control the scale or the itching, please  contact us and we will be happy to go with a prescription. Routine follow-up can be by phone.  I, Clinton Harder, MD, have reviewed all documentation for this visit.  The documentation on 06/11/20 for the exam, diagnosis, procedures, and orders are all accurate and complete.

## 2020-10-29 ENCOUNTER — Ambulatory Visit
Admission: EM | Admit: 2020-10-29 | Discharge: 2020-10-29 | Disposition: A | Discharge status: Home / Self Care | Payer: 59 | Attending: Internal Medicine | Admitting: Internal Medicine

## 2020-10-29 ENCOUNTER — Ambulatory Visit: Payer: PRIVATE HEALTH INSURANCE

## 2020-10-29 ENCOUNTER — Ambulatory Visit (INDEPENDENT_AMBULATORY_CARE_PROVIDER_SITE_OTHER): Payer: PRIVATE HEALTH INSURANCE

## 2020-10-29 ENCOUNTER — Other Ambulatory Visit: Payer: Self-pay

## 2020-10-29 ENCOUNTER — Encounter: Payer: Self-pay | Admitting: Emergency Medicine

## 2020-10-29 DIAGNOSIS — S20211A Contusion of right front wall of thorax, initial encounter: Secondary | ICD-10-CM

## 2020-10-29 DIAGNOSIS — R079 Chest pain, unspecified: Secondary | ICD-10-CM

## 2020-10-29 DIAGNOSIS — X58XXXA Exposure to other specified factors, initial encounter: Secondary | ICD-10-CM

## 2020-10-29 MED ORDER — LIDOCAINE 5 % EX PTCH: 1.0000 | MEDICATED_PATCH | CUTANEOUS | 0 refills | Status: DC

## 2020-10-29 MED ORDER — NABUMETONE 750 MG PO TABS: 750.0000 mg | ORAL_TABLET | Freq: Two times a day (BID) | ORAL | 0 refills | Status: DC

## 2020-10-29 NOTE — ED Provider Notes (Signed)
[facility]    CSN: [id number] Arrival date & time: 10/29/20  0906      History   Chief Complaint No chief complaint on file.   HPI Clinton Fisher is a 30 y.o. male who presents with R chest wall pain and rib soreness since he got hit with a player's shoulder when playing basketball 2 days ago. He had been drinking and smoked " a joint' that day,  And went to bed. 2 hours later woke up on his abdomen with severe R chest wall pain. Taking deep breaths causes more pain. He admits he is a smoker and has continued smoking.     Past Medical History:  Diagnosis Date  . Abrasion of index finger 07/29/2013   left  . 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 GERD (gastroesophageal reflux disease)    no current med.  . Seasonal allergies    sore throat 07/29/2013    There are no problems to display for this patient.   Past Surgical History:  Procedure Laterality Date  . FOOT FOREIGN BODY REMOVAL Right 01/19/2011  . KNEE ARTHROSCOPY Right 08/01/2013   Procedure: ARTHROSCOPY RIGHT KNEE, Chondroplasty, Excision of Plica;  Surgeon: Harvie Junior, MD;  Location: [facility];  Service: Orthopedics;  Laterality: Right;       Home Medications    Prior to Admission medications   Medication Sig Start Date End Date Taking? Authorizing Provider  lidocaine (LIDODERM) 5 % Place 1 patch onto the skin daily. Remove & Discard patch within 12 hours or as directed by MD 10/29/20  Yes Rodriguez-Southworth, Nettie Elm, PA-C  nabumetone (RELAFEN) 750 MG tablet Take 1 tablet (750 mg total) by mouth 2 (two) times daily. 10/29/20  Yes Rodriguez-Southworth, Viviana Simpler    Family History History reviewed. No pertinent family history.  Social History Social History   Tobacco Use  . Smoking status: Current Every Day Smoker  . Smokeless tobacco: Never Used  Substance Use Topics  . Alcohol use: Yes    Comment: every 3 days  . Drug use: No     Allergies   Augmentin  [amoxicillin-pot clavulanate]   Review of Systems Review of Systems  Constitutional: Negative for fever.  HENT: Negative for congestion.   Respiratory: Negative for cough and chest tightness.        Has chest wall pain  Cardiovascular: Negative for leg swelling.  Gastrointestinal: Negative for abdominal pain.  Musculoskeletal: Positive for gait problem. Negative for back pain.       Walking for too long causes him chest wall pain  Skin: Negative for rash.     Physical Exam Triage Vital Signs ED Triage Vitals  Enc Vitals Group     BP 10/29/20 0920 (!) 141/94     Pulse Rate 10/29/20 0920 77     Resp 10/29/20 0920 18     Temp 10/29/20 0920 98.6 F (37 C)     Temp Source 10/29/20 0920 Oral     SpO2 10/29/20 0920 97 %     Weight --      Height --      Head Circumference --      Peak Flow --      Pain Score 10/29/20 0918 9     Pain Loc --      Pain Edu? --      Excl. in GC? --    No data found.  Updated Vital Signs BP (!) 141/94 (BP  Location: Left Arm)   Pulse 77   Temp 98.6 F (37 C) (Oral)   Resp 18   SpO2 97%   Visual Acuity Right Eye Distance:   Left Eye Distance:   Bilateral Distance:    Right Eye Near:   Left Eye Near:    Bilateral Near:     Physical Exam Vitals and nursing note reviewed.  Constitutional:      General: He is not in acute distress.    Appearance: He is normal weight. He is not toxic-appearing.  HENT:     Right Ear: External ear normal.     Left Ear: External ear normal.  Eyes:     General: No scleral icterus.    Conjunctiva/sclera: Conjunctivae normal.  Cardiovascular:     Rate and Rhythm: Normal rate and regular rhythm.     Heart sounds: No murmur heard.   Pulmonary:     Effort: Pulmonary effort is normal.     Breath sounds: Normal breath sounds. No wheezing, rhonchi or rales.     Comments: Has point tenderness on 9th and 10th rib area. Skin does not look swollen or with ecchymosis. No crepitations palpated. Has generalized  soreness on anterior R upper chest wall.  Chest:     Chest wall: Tenderness present.  Musculoskeletal:        General: Normal range of motion.     Cervical back: Neck supple.  Skin:    General: Skin is warm and dry.     Findings: No rash.  Neurological:     Mental Status: He is alert and oriented to person, place, and time.     Gait: Gait normal.  Psychiatric:        Mood and Affect: Mood normal.        Behavior: Behavior normal.        Thought Content: Thought content normal.        Judgment: Judgment normal.      UC Treatments / Results  Labs (all labs ordered are listed, but only abnormal results are displayed) Labs Reviewed - No data to display  EKG   Radiology DG Ribs Unilateral W/Chest Right  Result Date: 10/29/2020 CLINICAL DATA:  Posttraumatic right chest pain. EXAM: RIGHT RIBS AND CHEST - 3+ VIEW COMPARISON:  05/31/2018 FINDINGS: No fracture or other bone lesions are seen involving the ribs. There is no evidence of pneumothorax or pleural effusion. Both lungs are clear. Heart size and mediastinal contours are within normal limits. IMPRESSION: Negative. Electronically Signed   By: Marnee Spring M.D.   On: 10/29/2020 09:52    Procedures Procedures (including critical care time)  Medications Ordered in UC Medications - No data to display  Initial Impression / Assessment and Plan / UC Course  I have reviewed the triage vital signs and the nursing notes. Pertinent  imaging results that were available during my care of the patient were reviewed by me and considered in my medical decision making (see chart for details). Has R rib contusion. I placed him on Relafen and Lidoderm patches as noted. May FU with PCP. See  Instructions.   Final Clinical Impressions(s) / UC Diagnoses   Final diagnoses:  Rib contusion, right, initial encounter     Discharge Instructions     Your xray does not show broken ribs. You just have bruised your chest Apply ice on area of  pain for 20 minutes at a time 2-4 times day today and tomorrow. Use a pillow against your  chest to take deep breaths or cough. This is to prevent pneumonia. I am prescribing pain medicine to help with inflammation and numbing patches to help with pain which It has helped patients I saw with broken ribs. Follow up with your primary care provider in 7-10 day if you are not getting better.      ED Prescriptions    Medication Sig Dispense Auth. Provider   nabumetone (RELAFEN) 750 MG tablet Take 1 tablet (750 mg total) by mouth 2 (two) times daily. 20 tablet Rodriguez-Southworth, Jarita Raval, PA-C   lidocaine (LIDODERM) 5 % Place 1 patch onto the skin daily. Remove & Discard patch within 12 hours or as directed by MD 10 patch Rodriguez-Southworth, Nettie Elm, PA-C     I have reviewed the PDMP during this encounter.   Garey Ham, PA-C 10/29/20 1008

## 2020-10-29 NOTE — ED Triage Notes (Signed)
Was playing basket ball and was hit on the right side with someone's shoulder, 2 days ago.

## 2020-10-29 NOTE — Discharge Instructions (Signed)
Your xray does not show broken ribs. You just have bruised your chest Apply ice on area of pain for 20 minutes at a time 2-4 times day today and tomorrow. Use a pillow against your chest to take deep breaths or cough. This is to prevent pneumonia. I am prescribing pain medicine to help with inflammation and numbing patches to help with pain which It has helped patients I saw with broken ribs. Follow up with your primary care provider in 7-10 day if you are not getting better.

## 2020-12-06 ENCOUNTER — Ambulatory Visit: Payer: PRIVATE HEALTH INSURANCE | Admitting: Dermatology

## 2020-12-08 ENCOUNTER — Emergency Department (HOSPITAL_COMMUNITY)
Admission: EM | Admit: 2020-12-08 | Discharge: 2020-12-09 | Disposition: A | Discharge status: Other Acute Inpatient Hospital | Payer: 59 | Attending: Emergency Medicine | Admitting: Emergency Medicine

## 2020-12-08 ENCOUNTER — Encounter (HOSPITAL_COMMUNITY): Payer: Self-pay

## 2020-12-08 ENCOUNTER — Ambulatory Visit (HOSPITAL_COMMUNITY)
Admission: RE | Admit: 2020-12-08 | Discharge: 2020-12-08 | Disposition: A | Discharge status: Home / Self Care | Payer: 59 | Source: Home / Self Care | Attending: Psychiatry | Admitting: Psychiatry

## 2020-12-08 ENCOUNTER — Other Ambulatory Visit: Payer: Self-pay

## 2020-12-08 DIAGNOSIS — F17200 Nicotine dependence, unspecified, uncomplicated: Secondary | ICD-10-CM | POA: Diagnosis not present

## 2020-12-08 DIAGNOSIS — Z20822 Contact with and (suspected) exposure to covid-19: Secondary | ICD-10-CM | POA: Insufficient documentation

## 2020-12-08 DIAGNOSIS — R45850 Homicidal ideations: Secondary | ICD-10-CM | POA: Insufficient documentation

## 2020-12-08 DIAGNOSIS — F312 Bipolar disorder, current episode manic severe with psychotic features: Secondary | ICD-10-CM | POA: Insufficient documentation

## 2020-12-08 DIAGNOSIS — F3160 Bipolar disorder, current episode mixed, unspecified: Secondary | ICD-10-CM | POA: Insufficient documentation

## 2020-12-08 DIAGNOSIS — R45851 Suicidal ideations: Secondary | ICD-10-CM | POA: Insufficient documentation

## 2020-12-08 DIAGNOSIS — F319 Bipolar disorder, unspecified: Secondary | ICD-10-CM

## 2020-12-08 DIAGNOSIS — R440 Auditory hallucinations: Secondary | ICD-10-CM | POA: Diagnosis not present

## 2020-12-08 DIAGNOSIS — Z046 Encounter for general psychiatric examination, requested by authority: Secondary | ICD-10-CM | POA: Diagnosis present

## 2020-12-08 HISTORY — DX: Other bipolar disorder: F31.89

## 2020-12-08 LAB — COMPREHENSIVE METABOLIC PANEL
ALT: 18 U/L (ref 0–44)
AST: 17 U/L (ref 15–41)
Albumin: 4.8 g/dL (ref 3.5–5.0)
Alkaline Phosphatase: 70 U/L (ref 38–126)
Anion gap: 8 (ref 5–15)
BUN: 14 mg/dL (ref 6–20)
CO2: 27 mmol/L (ref 22–32)
Calcium: 9.2 mg/dL (ref 8.9–10.3)
Chloride: 104 mmol/L (ref 98–111)
Creatinine, Ser: 0.81 mg/dL (ref 0.61–1.24)
GFR, Estimated: >60 mL/min (ref >60)
Glucose, Bld: 86 mg/dL (ref 70–99)
Potassium: 3.8 mmol/L (ref 3.5–5.1)
Sodium: 139 mmol/L (ref 135–145)
Total Bilirubin: 0.8 mg/dL (ref 0.3–1.2)
Total Protein: 7.9 g/dL (ref 6.5–8.1)

## 2020-12-08 LAB — CBC WITH DIFFERENTIAL/PLATELET
Abs Immature Granulocytes: 0.01 10*3/uL (ref 0.00–0.07)
Basophils Absolute: 0 10*3/uL (ref 0.0–0.1)
Basophils Relative: 0 %
Eosinophils Absolute: 0 10*3/uL (ref 0.0–0.5)
Eosinophils Relative: 0 %
HCT: 43.1 % (ref 39.0–52.0)
Hemoglobin: 14.7 g/dL (ref 13.0–17.0)
Immature Granulocytes: 0 %
Lymphocytes Relative: 22 %
Lymphs Abs: 1.5 10*3/uL (ref 0.7–4.0)
MCH: 30.4 pg (ref 26.0–34.0)
MCHC: 34.1 g/dL (ref 30.0–36.0)
MCV: 89 fL (ref 80.0–100.0)
Monocytes Absolute: 0.5 10*3/uL (ref 0.1–1.0)
Monocytes Relative: 8 %
Neutro Abs: 4.6 10*3/uL (ref 1.7–7.7)
Neutrophils Relative %: 70 %
Platelets: 289 10*3/uL (ref 150–400)
RBC: 4.84 MIL/uL (ref 4.22–5.81)
RDW: 11.9 % (ref 11.5–15.5)
WBC: 6.7 10*3/uL (ref 4.0–10.5)
nRBC: 0 % (ref 0.0–0.2)

## 2020-12-08 LAB — RAPID URINE DRUG SCREEN, HOSP PERFORMED
Amphetamines: NOT DETECTED
Barbiturates: NOT DETECTED
Benzodiazepines: NOT DETECTED
Cocaine: NOT DETECTED
Opiates: NOT DETECTED
Tetrahydrocannabinol: POSITIVE — AB

## 2020-12-08 LAB — ETHANOL: Alcohol, Ethyl (B): <10 mg/dL (ref <10)

## 2020-12-08 LAB — RESP PANEL BY RT-PCR (FLU A&B, COVID) ARPGX2
Influenza A by PCR: NEGATIVE
Influenza B by PCR: NEGATIVE
SARS Coronavirus 2 by RT PCR: NEGATIVE

## 2020-12-08 MED ORDER — NABUMETONE 500 MG PO TABS
750.0000 mg | ORAL_TABLET | Freq: Two times a day (BID) | ORAL | Status: DC
  Administered 2020-12-09 (×3): 750 mg via ORAL
  Filled 2020-12-08 (×4): qty 2

## 2020-12-08 NOTE — ED Triage Notes (Signed)
Pt states he cannot take care of himself and needs help to take care of himself. Pt states he is trying to get on disability and is unable to. Pt is manic, this Rn is attempting to keep pt focused on answering questions. Pt states [facility] sent him here until they have room for him.

## 2020-12-08 NOTE — BH Assessment (Signed)
Pt's mother, Dandrea Widdowson, has requested the code for Clinton Fisher so she can call and speak to him; she understands pt has to give permission for [place] to acknowledge pt is even in the hospital.  Clinton Fisher, mother: [phone]

## 2020-12-08 NOTE — ED Provider Notes (Signed)
[facility]-EMERGENCY DEPT Provider Note   CSN: [id number] Arrival date & time: 12/08/20  1923     History Chief Complaint  Patient presents with  . Psychiatric Evaluation    Clinton Fisher is a 30 y.o. male who presents for "being a failure at life."  Patient states that he has been trying to leave his mother's house for 13 years but he always loses a job or forgets to pay bills or gets kicked out.  I asked the patient if he use any drugs or alcohol and he said "anything I get my hands on for the past 13 years."  His last drug use was a pain pill a friend gave him this past week.  When asked if he has suicidal or homicidal ideation the patient states "every day for the past 13 years."  He states that he is actively suicidal and homicidal right now.  He also states that he hears things which makes sleeping difficult.  He has no other complaints at this time  HPI     Past Medical History:  Diagnosis Date  . Abrasion of index finger 07/29/2013   left  . 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 GERD (gastroesophageal reflux disease)    no current med.  . Manic affective disorder with recurrent episode (HCC)   . Seasonal allergies    sore throat 07/29/2013    There are no problems to display for this patient.   Past Surgical History:  Procedure Laterality Date  . FOOT FOREIGN BODY REMOVAL Right 01/19/2011  . KNEE ARTHROSCOPY Right 08/01/2013   Procedure: ARTHROSCOPY RIGHT KNEE, Chondroplasty, Excision of Plica;  Surgeon: Harvie Junior, MD;  Location: [facility];  Service: Orthopedics;  Laterality: Right;       History reviewed. No pertinent family history.  Social History   Tobacco Use  . Smoking status: Current Every Day Smoker  . Smokeless tobacco: Never Used  Substance Use Topics  . Alcohol use: Yes    Comment: every 3 days  . Drug use: No    Home Medications Prior to Admission medications   Medication Sig Start Date End  Date Taking? Authorizing Provider  lidocaine (LIDODERM) 5 % Place 1 patch onto the skin daily. Remove & Discard patch within 12 hours or as directed by MD 10/29/20   Rodriguez-Southworth, Nettie Elm, PA-C  nabumetone (RELAFEN) 750 MG tablet Take 1 tablet (750 mg total) by mouth 2 (two) times daily. 10/29/20   Rodriguez-Southworth, Nettie Elm, PA-C    Allergies    Augmentin [amoxicillin-pot clavulanate]  Review of Systems   Review of Systems Ten systems reviewed and are negative for acute change, except as noted in the HPI.   Physical Exam Updated Vital Signs BP 115/72 (BP Location: Right Arm)   Pulse (!) 59   Temp 98.3 F (36.8 C) (Oral)   Resp 16   SpO2 98%   Physical Exam Vitals and nursing note reviewed.  Constitutional:      General: He is not in acute distress.    Appearance: He is well-developed. He is not diaphoretic.  HENT:     Head: Normocephalic and atraumatic.  Eyes:     General: No scleral icterus.    Conjunctiva/sclera: Conjunctivae normal.  Cardiovascular:     Rate and Rhythm: Normal rate and regular rhythm.     Heart sounds: Normal heart sounds.  Pulmonary:     Effort: Pulmonary effort is normal. No respiratory distress.  Breath sounds: Normal breath sounds.  Abdominal:     Palpations: Abdomen is soft.     Tenderness: There is no abdominal tenderness.  Musculoskeletal:     Cervical back: Normal range of motion and neck supple.  Skin:    General: Skin is warm and dry.  Neurological:     Mental Status: He is alert.  Psychiatric:        Attention and Perception: He perceives auditory hallucinations.        Speech: Speech is tangential.        Behavior: Behavior is cooperative.        Thought Content: Thought content includes homicidal and suicidal ideation.     ED Results / Procedures / Treatments   Labs (all labs ordered are listed, but only abnormal results are displayed) Labs Reviewed  RAPID URINE DRUG SCREEN, HOSP PERFORMED - Abnormal; Notable for the  following components:      Result Value   Tetrahydrocannabinol POSITIVE (*)    All other components within normal limits  RESP PANEL BY RT-PCR (FLU A&B, COVID) ARPGX2  COMPREHENSIVE METABOLIC PANEL  ETHANOL  CBC WITH DIFFERENTIAL/PLATELET    EKG None  Radiology No results found.  Procedures Procedures {  Medications Ordered in ED Medications - No data to display  ED Course  I have reviewed the triage vital signs and the nursing notes.  Pertinent labs & imaging results that were available during my care of the patient were reviewed by me and considered in my medical decision making (see chart for details).    MDM Rules/Calculators/A&P                           Final Clinical Impression(s) / ED Diagnoses Final diagnoses:  Suicidal ideation   I ordered and reviewed labs that included CBC, CMP and ethanol all within normal limits, UDS positive for THC.  Respiratory panel is negative for influenza and COVID.  Patient is medically clear for psychiatric evaluation Rx / DC Orders ED Discharge Orders    None       Arthor Captain, PA-C 12/08/20 2231    Bethann Berkshire, MD 12/10/20 [phone]

## 2020-12-08 NOTE — H&P (Signed)
Behavioral Health Medical Screening Exam  Clinton Fisher is a 30 y.o. male.patient presented to [facility] as a walk in accompanied by his mother with complaints of "I dont know how to be around people".  Clinton Fisher, 30 y.o., male patient seen face to face by this provider, consulted with Dr. Lucianne Muss; and chart reviewed on 12/08/20.   During evaluation Clinton Fisher is in sitting position in no acute distress. He is anxious and fidgets his hands. He is disheveled. He makes minimal eye contact. He is alert, oriented x 4,cooperative and inattentive.  His mood is depressed with congruent affect. Patient has mixed emotions, ranging from laughing to tearfulness. His speech is tangential and varies from normal to pressured and loud. Patient is hyper verbal. He is thought process is disorganized. Endorses racing thoughts. States he cant turn off his brain, states his thoughts don't stop.   Patient states he has suicidal thoughts daily. States, "If I had a gun I would blow my brains out". Reports he has access to knives and wood working tools. Then states, Reports he currently does not have a plan but states that changes through out the day. Patient is impulsive. Reports he endorses homicidal ideations. States there are people with " a certain look". States, "I just want to kill them".  Patient endorses auditory hallucinations.  States, " I hear whispers calling my name and they tell me I ant worth nothing". Paranoid about his mother. States his mother is part of a "christian witch group". Hyper verbal about his mother. Rambles about how she didn't teach him anything, she messed up his life, she purposefully sabotaged his relationship with his daughter. Patient is exhibiting manic behavior. Patient could not contract for safety.  Collateral from mother: States she is concerned for Tanners safety states she keeps all the firearms put away. States she does not trust him.   Total Time spent with patient: 45  minutes  Psychiatric Specialty Exam:  Presentation  General Appearance: Bizarre; Disheveled  Eye Contact:Minimal  Speech:Clear and Coherent; Pressured  Speech Volume:Increased (increased at time)  Handedness:Right   Mood and Affect  Mood:No data recorded Affect:No data recorded  Thought Process  Thought Processes:Disorganized  Descriptions of Associations:Tangential  Orientation:Full (Time, Place and Person)  Thought Content:Illogical; Scattered  History of Schizophrenia/Schizoaffective disorder:Yes  Duration of Psychotic Symptoms:Greater than six months  Hallucinations:Hallucinations: Auditory Description of Auditory Hallucinations: states he hears whipsers that call his name and they tell him he is "no good"  Ideas of Reference:Paranoia  Suicidal Thoughts:Suicidal Thoughts: Yes, Passive SI Passive Intent and/or Plan: Without Intent; Without Plan; With Means to Carry Out  Homicidal Thoughts:Homicidal Thoughts: Yes, Passive HI Passive Intent and/or Plan: With Intent; Without Plan; Without Means to Carry Out   Sensorium  Memory:Immediate Good; Recent Good; Remote Good  Judgment:Poor  Insight:Poor   Executive Functions  Concentration:Poor  Attention Span:Poor  Recall:Fair  Fund of Knowledge:Fair  Language:Fair   Psychomotor Activity  Psychomotor Activity:Psychomotor Activity: Normal   Assets  Assets:Communication Skills; Desire for Improvement; Housing; Leisure Time; Physical Health; Resilience; Social Support   Sleep  Sleep:Sleep: Poor Number of Hours of Sleep: 4    Physical Exam: Physical Exam Vitals reviewed.  HENT:     Head: Normocephalic.     Right Ear: Tympanic membrane normal.     Left Ear: Tympanic membrane normal.     Nose: Nose normal.     Mouth/Throat:     Mouth: Mucous membranes are dry.  Pharynx: Oropharynx is clear.  Eyes:     Conjunctiva/sclera: Conjunctivae normal.  Cardiovascular:     Rate and Rhythm:  Normal rate.     Pulses: Normal pulses.  Pulmonary:     Effort: Pulmonary effort is normal.  Abdominal:     Tenderness: There is no guarding.  Musculoskeletal:        General: Normal range of motion.     Cervical back: Normal range of motion.  Skin:    General: Skin is warm.     Capillary Refill: Capillary refill takes less than 2 seconds.  Neurological:     Mental Status: He is alert and oriented to person, place, and time.  Psychiatric:        Attention and Perception: He is inattentive. He perceives auditory hallucinations.        Mood and Affect: Mood is anxious and depressed.        Speech: Speech is rapid and pressured and tangential.        Behavior: Behavior is cooperative.        Thought Content: Thought content is paranoid and delusional. Thought content includes homicidal and suicidal ideation.        Cognition and Memory: Cognition normal.        Judgment: Judgment is impulsive.    Review of Systems  Constitutional: Negative.   HENT: Negative.   Eyes: Negative.   Respiratory: Negative.   Cardiovascular: Negative.   Gastrointestinal: Negative.   Genitourinary: Negative.   Musculoskeletal: Negative.   Skin: Negative.   Neurological: Negative.   Endo/Heme/Allergies: Negative.   Psychiatric/Behavioral: Positive for depression, hallucinations and suicidal ideas. The patient is nervous/anxious.    Blood pressure (!) 135/100, pulse 79, temperature 98.4 F (36.9 C), temperature source Oral, resp. rate 18. There is no height or weight on file to calculate BMI.  Musculoskeletal: Strength & Muscle Tone: within normal limits Gait & Station: normal Patient leans: N/A   Recommendations:  Based on my evaluation the patient does not appear to have an emergency medical condition.   Patient meets inpatient psychiatric criteria. Patient will be sent to [facility]. There are no available beds at [facility] or [facility]. Patient will be reassessed in am, and bed availability will be  reassessed.   Ardis Hughs, NP 12/08/2020, 6:52 PM

## 2020-12-08 NOTE — ED Notes (Signed)
Per NP, patient is a walk in at [facility] beds, sending here due to mania

## 2020-12-08 NOTE — BH Assessment (Signed)
Comprehensive Clinical Assessment (CCA) Note  12/08/2020 Clinton Fisher [id number]  Recommendations for Services/Supports/Treatments: Clinton Lolling, NP, reviewed pt's chart and information and met with pt and his mother and determined pt should be observed overnight for safety and stability and re-assessed in the morning. The [facility] is currently at capacity for adults, so pt will be transferred to [facility].  The patient demonstrates the following risk factors for suicide: Chronic risk factors for suicide include: psychiatric disorder of Bipolar I with Psychotic Features and demographic factors (male, >28 y/o). Acute risk factors for suicide include: family or marital conflict, unemployment, social withdrawal/isolation and loss (financial, interpersonal, professional). Protective factors for this patient include: hope for the future. Considering these factors, the overall suicide risk at this point appears to be low. Patient is not appropriate for outpatient follow up.  Therefore, a tele-sitter is recommended for suicide prevention.  Flowsheet Row OP Visit from 12/08/2020 in [place] ED from 10/29/2020 in [facility] [place] No Risk     Chief Complaint:  Chief Complaint  Patient presents with  . Psychiatric Evaluation   Visit Diagnosis: F31.2, Bipolar I disorder, Current or most recent episode manic, With psychotic features   CCA Screening, Triage and Referral (STR) Clinton Fisher is a 30 year old patient who was brought to the [facility] by his mother due to pt experiencing daily SI (without a plan), AH, and paranoia. He also expresses HI towards someone with a "shady" look; someone who "uses deceitful tactics." Pt's mother shares pt is convinced someone has been breaking into their home and stealing his Engineer, water.  Pt was manic and had a flight of ideas throughout the assessment. He shares  he has to run a fan at night to help with him not hearing voices, which he states call his name and say mean things to him. He states he told his mother today that he can't take care of himself and that he needs to live somewhere that can teach him how to live independently. Pt's mother states pt has been dx with Bipolar Disorder, ODD, ADHD, and a processing disorder.   Pt is oriented x5. His recent/remote memory is intact. Pt was cooperative throughout the assessment process. Pt's insight, judgement, and impulse control is poor at this time.   Patient Reported Information How did you hear about [place]? Family/Friend  Referral name: Clinton Fisher, mother: [phone]  Referral phone number: [phone]   Barber do you see for routine medical problems? I don't have a doctor  Practice/Facility Name: No data recorded Practice/Facility Phone Number: No data recorded Name of Contact: No data recorded Contact Number: No data recorded Contact Fax Number: No data recorded Prescriber Name: No data recorded Prescriber Address (if known): No data recorded  What Is the Reason for Your Visit/Call Today? Pt states he does not feel he can independently care for himself. He expresses anger towards his mother and paranoia.  How Long Has This Been Causing You Problems? > than 6 months  What Do You Feel Would Help You the Most Today? Treatment for Depression or other mood problem; Medication(s)   Have You Recently Been in Any Inpatient Treatment (Hospital/Detox/Crisis Center/28-Day Program)? No  Name/Location of Program/Hospital:No data recorded How Long Were You There? No data recorded When Were You Discharged? No data recorded  Have You Ever Received Services From [facility] Before? Yes  Who Do You See at [facility]? Various providers  in the EDs and Urgent Care   Have You Recently Had Any Thoughts About Hurting Yourself? Yes  Are You Planning to Commit Suicide/Harm Yourself At This time?  No   Have you Recently Had Thoughts About [place]? Yes  Explanation: No data recorded  Have You Used Any Alcohol or Drugs in the Past 24 Hours? No  How Long Ago Did You Use Drugs or Alcohol? No data recorded What Did You Use and How Much? No data recorded  Do You Currently Have a Therapist/Psychiatrist? No  Name of Therapist/Psychiatrist: No data recorded  Have You Been Recently Discharged From Any Office Practice or Programs? No  Explanation of Discharge From Practice/Program: No data recorded    CCA Screening Triage Referral Assessment Type of Contact: Face-to-Face  Is this Initial or Reassessment? No data recorded Date Telepsych consult ordered in [facility]:  No data recorded Time Telepsych consult ordered in [facility]:  No data recorded  Patient Reported Information Reviewed? Yes  Patient Left Without Being Seen? No data recorded Reason for Not Completing Assessment: No data recorded  Collateral Involvement: Clinton Fisher, mother   Does Patient Have a Court Appointed Legal Guardian? No data recorded Name and Contact of Legal Guardian: No data recorded If Minor and Not Living with Parent(s), Who has Custody? N/A  Is CPS involved or ever been involved? Never  Is APS involved or ever been involved? Never   Patient Determined To Be At Risk for Harm To Self or Others Based on Review of Patient Reported Information or Presenting Complaint? No  Method: No data recorded Availability of Means: No data recorded Intent: No data recorded Notification Required: No data recorded Additional Information for Danger to Others Potential: No data recorded Additional Comments for Danger to Others Potential: No data recorded Are There Guns or Other Weapons in Your Home? No data recorded Types of Guns/Weapons: No data recorded Are These Weapons Safely Secured?                            No data recorded Who Could Verify You Are Able To Have These Secured: No data recorded Do  You Have any Outstanding Charges, Pending Court Dates, Parole/Probation? No data recorded Contacted To Inform of Risk of Harm To Self or Others: -- (N/A)   [facility] of Assessment: [facility]   Does Patient Present under Involuntary Commitment? No  IVC Papers Initial File Date: No data recorded  [place] of Residence: [place]   Patient Currently Receiving the Following Services: Not Receiving Services   Determination of Need: Urgent (48 hours)   Options For Referral: Other: Comment; Medication Management; Outpatient Therapy (Overnight Observation)     CCA Biopsychosocial Intake/Chief Complaint:  Pt states he does not feel he can independently care for himself. He expresses anger towards his mother and paranoia.  Current Symptoms/Problems: Pt is experiencing anxiety, depression, and paranoia   Patient Reported Schizophrenia/Schizoaffective Diagnosis in Past: Yes   Strengths: Not assessed  Preferences: Not assessed  Abilities: Not assessed   Type of Services Patient Feels are Needed: Pt is unsure; he mentions living somewhere that can teach him living skills   Initial Clinical Notes/Concerns: Pt is initially hesitant re: taking medication   Mental Health Symptoms Depression:  Difficulty Concentrating; Irritability; Sleep (too much or little); Tearfulness   Duration of Depressive symptoms: Greater than two weeks   Mania:  Increased Energy; Racing thoughts   Anxiety:   Worrying; Tension;  Difficulty concentrating   Psychosis:  Delusions   Duration of Psychotic symptoms: Greater than six months   Trauma:  None   Obsessions:  None   Compulsions:  None   Inattention:  Disorganized; Fails to pay attention/makes careless mistakes; Forgetful; Poor follow-through on tasks   Hyperactivity/Impulsivity:  Always on the go; Feeling of restlessness; Symptoms present before age 12; Several symptoms present in 2 of more settings   Oppositional/Defiant  Behaviors:  Angry; Argumentative; Easily annoyed   Emotional Irregularity:  Intense/inappropriate anger; Intense/unstable relationships; Mood lability; Potentially harmful impulsivity   Other Mood/Personality Symptoms:  None noted    Mental Status Exam Appearance and self-care  Stature:  Average   Weight:  Average weight   Clothing:  Casual   Grooming:  Normal   Cosmetic use:  None   Posture/gait:  Normal   Motor activity:  Not Remarkable   Sensorium  Attention:  Distractible   Concentration:  Anxiety interferes; Focuses on irrelevancies; Scattered   Orientation:  X5   Recall/memory:  Normal   Affect and Mood  Affect:  Anxious; Full Range; Tearful   Mood:  Anxious; Depressed   Relating  Eye contact:  Normal   Facial expression:  Responsive   Attitude toward examiner:  Cooperative   Thought and Language  Speech flow: Flight of Ideas   Thought content:  Appropriate to Mood and Circumstances   Preoccupation:  None   Hallucinations:  Auditory   Organization:  No data recorded  [employer] of Knowledge:  Average   Intelligence:  Average   Abstraction:  Functional   Judgement:  Impaired   Reality Testing:  Distorted   Insight:  Gaps   Decision Making:  Impulsive   Social Functioning  Social Maturity:  Impulsive; Irresponsible   Social Judgement:  Naive   Stress  Stressors:  Family conflict; Housing; Museum/gallery curator; Relationship; Work   Coping Ability:  Deficient supports; Exhausted; Overwhelmed   Skill Deficits:  Activities of daily living; Decision making; Interpersonal; Self-care; Self-control; Responsibility   Supports:  Family     Religion: Religion/Spirituality Are You A Religious Person?:  (Not assessed) How Might This Affect Treatment?: Not assessed  Leisure/Recreation: Leisure / Recreation Do You Have Hobbies?:  (Not assessed)  Exercise/Diet: Exercise/Diet Do You Exercise?:  (Not assessed) Have You Gained or  Lost A Significant Amount of Weight in the Past Six Months?:  (Not assessed) Do You Follow a Special Diet?:  (Not assessed) Do You Have Any Trouble Sleeping?: Yes Explanation of Sleeping Difficulties: Pt shares he has to run a fan at night because he heaers voices. He statse he wakes up "every hour on the hour."   CCA Employment/Education Employment/Work Situation: Employment / Work Situation Employment situation: Unemployed Patient's job has been impacted by current illness: Yes Describe how patient's job has been impacted: Pt has difficulties keeping a job due to his inability to keep up with his schedule What is the longest time patient has a held a job?: Not assessed Where was the patient employed at that time?: Not assessed Has patient ever been in the [employer]?:  (Not assessed)  Education: Education Is Patient Currently Attending School?: No Last Grade Completed:  (Not assessed) Name of [place]: Not assessed Did Teacher, adult education From [employer]?:  (Not assessed) Did Physicist, medical?:  (Not assessed) Did You Attend Graduate School?:  (Not assessed) Did You Have Any Special Interests In School?: Not assessed Did You Have An Individualized Education Program (IIEP):  (  Not assessed) Did You Have Any Difficulty At School?: Yes Were Any Medications Ever Prescribed For These Difficulties?: Yes Medications Prescribed For School Difficulties?: Concerta Patient's Education Has Been Impacted by Current Illness: No   CCA Family/Childhood History Family and Relationship History: Family history Marital status: Single Are you sexually active?:  (Not assessed) What is your sexual orientation?: Not assessed Has your sexual activity been affected by drugs, alcohol, medication, or emotional stress?: Not assessed Does patient have children?: Yes How is patient's relationship with their children?: Pt is not allowed to see his 50-year-old daughter due to his inability to keep a job and,  thus, not paying child support.  Childhood History:  Childhood History By whom was/is the patient raised?:  (Not assessed) Additional childhood history information: Not assessed Description of patient's relationship with caregiver when they were a child: Not assessed Patient's description of current relationship with people who raised him/her: Pt becomes angry/stressed with his mother How were you disciplined when you got in trouble as a child/adolescent?: Not assessed Does patient have siblings?:  (Not assessed) Did patient suffer any verbal/emotional/physical/sexual abuse as a child?:  (Not assessed) Did patient suffer from severe childhood neglect?:  (Not assessed) Has patient ever been sexually abused/assaulted/raped as an adolescent or adult?:  (Not assessed) Was the patient ever a victim of a crime or a disaster?:  (Not assessed) Witnessed domestic violence?:  (Not assessed) Has patient been affected by domestic violence as an adult?:  (Not assessed)  Child/Adolescent Assessment:     CCA Substance Use Alcohol/Drug Use: Alcohol / Drug Use Pain Medications: See MAR Prescriptions: See MAR Over the Counter: See MAR History of alcohol / drug use?: Yes Longest period of sobriety (when/how long): Unknown; pt states he is currently sober with the exception of his "fake weed" vape Negative Consequences of Use:  (None noted) Withdrawal Symptoms:  (None noted)                         ASAM's:  Six Dimensions of Multidimensional Assessment  Dimension 1:  Acute Intoxication and/or Withdrawal Potential:      Dimension 2:  Biomedical Conditions and Complications:      Dimension 3:  Emotional, Behavioral, or Cognitive Conditions and Complications:     Dimension 4:  Readiness to Change:     Dimension 5:  Relapse, Continued use, or Continued Problem Potential:     Dimension 6:  Recovery/Living Environment:     ASAM Severity Score:    ASAM Recommended Level of Treatment: ASAM  Recommended Level of Treatment:  (N/A)   Substance use Disorder (SUD) Substance Use Disorder (SUD)  Checklist Symptoms of Substance Use:  (N/A)  Recommendations for Services/Supports/Treatments: Recommendations for Services/Supports/Treatments Recommendations For Services/Supports/Treatments: Other (Comment),Individual Therapy,Medication Management (Overnight observation)  Clinton Lolling, NP, reviewed pt's chart and information and met with pt and his mother and determined pt should be observed overnight for safety and stability and re-assessed in the morning. The [facility] is currently at capacity for adults, so pt will be transferred to [facility].  DSM5 Diagnoses: F31.2, Bipolar I disorder, Current or most recent episode manic, With psychotic features  Patient Centered Plan: Patient is on the following Treatment Plan(s):  Anxiety, Depression and Impulse Control   Referrals to Alternative Service(s): Referred to Alternative Service(s):   Place:   Date:   Time:    Referred to Alternative Service(s):   Place:   Date:   Time:    Referred to Alternative  Service(s):   Place:   Date:   Time:    Referred to Alternative Service(s):   Place:   Date:   Time:     Dannielle Burn, LMFT

## 2020-12-09 ENCOUNTER — Inpatient Hospital Stay (HOSPITAL_COMMUNITY)
Admission: AD | Admit: 2020-12-09 | Discharge: 2020-12-20 | DRG: 885 | Disposition: A | Discharge status: Home / Self Care | Payer: 59 | Source: Intra-hospital | Attending: Psychiatry | Admitting: Psychiatry

## 2020-12-09 DIAGNOSIS — F1220 Cannabis dependence, uncomplicated: Secondary | ICD-10-CM | POA: Diagnosis present

## 2020-12-09 DIAGNOSIS — F319 Bipolar disorder, unspecified: Secondary | ICD-10-CM | POA: Diagnosis not present

## 2020-12-09 DIAGNOSIS — K219 Gastro-esophageal reflux disease without esophagitis: Secondary | ICD-10-CM | POA: Diagnosis present

## 2020-12-09 DIAGNOSIS — F312 Bipolar disorder, current episode manic severe with psychotic features: Secondary | ICD-10-CM | POA: Diagnosis present

## 2020-12-09 DIAGNOSIS — Z79899 Other long term (current) drug therapy: Secondary | ICD-10-CM

## 2020-12-09 DIAGNOSIS — F411 Generalized anxiety disorder: Secondary | ICD-10-CM | POA: Diagnosis present

## 2020-12-09 DIAGNOSIS — F17210 Nicotine dependence, cigarettes, uncomplicated: Secondary | ICD-10-CM | POA: Diagnosis present

## 2020-12-09 DIAGNOSIS — F17200 Nicotine dependence, unspecified, uncomplicated: Secondary | ICD-10-CM | POA: Diagnosis not present

## 2020-12-09 DIAGNOSIS — G4700 Insomnia, unspecified: Secondary | ICD-10-CM | POA: Diagnosis present

## 2020-12-09 DIAGNOSIS — R45850 Homicidal ideations: Secondary | ICD-10-CM | POA: Diagnosis present

## 2020-12-09 DIAGNOSIS — F3113 Bipolar disorder, current episode manic without psychotic features, severe: Secondary | ICD-10-CM | POA: Diagnosis not present

## 2020-12-09 DIAGNOSIS — R45851 Suicidal ideations: Secondary | ICD-10-CM | POA: Diagnosis present

## 2020-12-09 DIAGNOSIS — T1490XA Injury, unspecified, initial encounter: Secondary | ICD-10-CM

## 2020-12-09 MED ORDER — QUETIAPINE FUMARATE 25 MG PO TABS
25.0000 mg | ORAL_TABLET | Freq: Two times a day (BID) | ORAL | Status: DC
  Administered 2020-12-09 (×2): 25 mg via ORAL
  Filled 2020-12-09 (×2): qty 1

## 2020-12-09 NOTE — Progress Notes (Signed)
Tried to call report to [facility] but nurse unavailable at this time and will call when available.

## 2020-12-09 NOTE — Progress Notes (Signed)
Called report to [place], Charity fundraiser at [facility]. Left number if she had any additional questions.   Calling [facility] to bring patient to [facility] Room 500-2.

## 2020-12-09 NOTE — Progress Notes (Addendum)
Case consult with Dr. Lucianne Muss:  Patient continues to meet inpatient psychiatric criteria.   Seroquel 25 mg PO BID initiated.
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
## 2020-12-09 NOTE — BH Assessment (Signed)
[facility] Assessment Progress Note  Per Vernard Gambles, NP, this voluntary pt requires psychiatric hospitalization at this time.  Linsey, RN, [facility] has assigned pt to [facility] Rm 500-2 to the service of Dr Fayrene Fearing.  [facility] will be ready to receive pt at 20:30.   EDP Coralee Pesa, DO and pt's nurse, Beth, have been notified.  Beth agrees to have pt sign Consent for Admission, to send original paperwork along with pt via [facility], and to call report to [phone].  Doylene Canning, [place] Behavioral Health Coordinator [phone]

## 2020-12-09 NOTE — Progress Notes (Signed)
Safe transport here to take patient to [facility]. NT walked with patient to safe transport vehicle.

## 2020-12-10 ENCOUNTER — Other Ambulatory Visit: Payer: Self-pay

## 2020-12-10 ENCOUNTER — Encounter (HOSPITAL_COMMUNITY): Payer: Self-pay | Admitting: Psychiatry

## 2020-12-10 DIAGNOSIS — F319 Bipolar disorder, unspecified: Secondary | ICD-10-CM | POA: Diagnosis not present

## 2020-12-10 LAB — HEMOGLOBIN A1C
Hgb A1c MFr Bld: 5.3 % (ref 4.8–5.6)
Mean Plasma Glucose: 105.41 mg/dL

## 2020-12-10 LAB — LIPID PANEL
Cholesterol: 139 mg/dL (ref 0–200)
HDL: 41 mg/dL (ref >40)
LDL Cholesterol: 79 mg/dL (ref 0–99)
Total CHOL/HDL Ratio: 3.4 RATIO
Triglycerides: 93 mg/dL (ref <150)
VLDL: 19 mg/dL (ref 0–40)

## 2020-12-10 LAB — TSH: TSH: 0.79 u[IU]/mL (ref 0.350–4.500)

## 2020-12-10 MED ORDER — LORAZEPAM 1 MG PO TABS
1.0000 mg | ORAL_TABLET | Freq: Four times a day (QID) | ORAL | Status: AC | PRN
  Administered 2020-12-16: 1 mg via ORAL
  Filled 2020-12-10: qty 1

## 2020-12-10 MED ORDER — MAGNESIUM HYDROXIDE 400 MG/5ML PO SUSP: 30.0000 mL | Freq: Every day | ORAL | Status: DC | PRN

## 2020-12-10 MED ORDER — QUETIAPINE FUMARATE 200 MG PO TABS
200.0000 mg | ORAL_TABLET | Freq: Every day | ORAL | Status: DC
  Administered 2020-12-10: 200 mg via ORAL
  Filled 2020-12-10 (×2): qty 1

## 2020-12-10 MED ORDER — LORAZEPAM 1 MG PO TABS: 1.0000 mg | ORAL_TABLET | ORAL | Status: DC | PRN

## 2020-12-10 MED ORDER — ACETAMINOPHEN 325 MG PO TABS
650.0000 mg | ORAL_TABLET | Freq: Four times a day (QID) | ORAL | Status: DC | PRN
  Administered 2020-12-12 – 2020-12-18 (×6): 650 mg via ORAL
  Filled 2020-12-10 (×7): qty 2

## 2020-12-10 MED ORDER — ALUM & MAG HYDROXIDE-SIMETH 200-200-20 MG/5ML PO SUSP
30.0000 mL | ORAL | Status: DC | PRN
  Administered 2020-12-12 – 2020-12-13 (×3): 30 mL via ORAL
  Filled 2020-12-10 (×3): qty 30

## 2020-12-10 MED ORDER — OLANZAPINE 5 MG PO TBDP: 5.0000 mg | ORAL_TABLET | Freq: Three times a day (TID) | ORAL | Status: DC | PRN

## 2020-12-10 MED ORDER — QUETIAPINE FUMARATE 25 MG PO TABS
25.0000 mg | ORAL_TABLET | Freq: Two times a day (BID) | ORAL | Status: DC
  Administered 2020-12-10: 25 mg via ORAL
  Filled 2020-12-10 (×4): qty 1

## 2020-12-10 MED ORDER — QUETIAPINE FUMARATE 100 MG PO TABS
100.0000 mg | ORAL_TABLET | Freq: Every day | ORAL | Status: DC
  Administered 2020-12-10: 100 mg via ORAL
  Filled 2020-12-10 (×3): qty 1

## 2020-12-10 MED ORDER — ZIPRASIDONE MESYLATE 20 MG IM SOLR: 20.0000 mg | Freq: Four times a day (QID) | INTRAMUSCULAR | Status: AC | PRN

## 2020-12-10 MED ORDER — CARBAMAZEPINE 100 MG PO CHEW
200.0000 mg | CHEWABLE_TABLET | Freq: Two times a day (BID) | ORAL | Status: DC
  Administered 2020-12-10 – 2020-12-11 (×3): 200 mg via ORAL
  Filled 2020-12-10 (×7): qty 2

## 2020-12-10 MED ORDER — ZIPRASIDONE MESYLATE 20 MG IM SOLR: 20.0000 mg | INTRAMUSCULAR | Status: DC | PRN

## 2020-12-10 MED ORDER — HYDROXYZINE HCL 25 MG PO TABS
25.0000 mg | ORAL_TABLET | Freq: Three times a day (TID) | ORAL | Status: DC | PRN
  Administered 2020-12-10 – 2020-12-20 (×6): 25 mg via ORAL
  Filled 2020-12-10: qty 1
  Filled 2020-12-10: qty 10
  Filled 2020-12-10 (×6): qty 1

## 2020-12-10 MED ORDER — OLANZAPINE 10 MG PO TBDP
10.0000 mg | ORAL_TABLET | Freq: Three times a day (TID) | ORAL | Status: DC | PRN
  Administered 2020-12-19: 10 mg via ORAL
  Filled 2020-12-10: qty 1

## 2020-12-10 NOTE — H&P (Signed)
Psychiatric Admission Assessment Adult  Patient Identification: Clinton Fisher MRN:  [id number] Date of Evaluation:  12/10/2020 Chief Complaint:  Bipolar disorder with psychotic features [facility]) [F31.9] Principal Diagnosis: <principal problem not specified> Diagnosis:  Active Problems:   Bipolar disorder with psychotic features (HCC)  History of Present Illness: Patient is seen and examined.  Patient is a 30 year old male with a reported past psychiatric history significant for bipolar disorder, most recently manic, severe with psychotic features who originally presented to the behavioral health urgent care center on 12/08/2020.  He was accompanied by his mother, and was noted to be anxious and fidgety.  It was felt that he was depressed.  He was labile and went from laughing to tearfulness quite rapidly.  He was noted to be tangential.  His mother was concerned for the patient's safety and keeps all firearms away from him.  At that point she stated that she does not trust him.  He also expressed suicidal and homicidal ideation.  He also uses marijuana on a daily basis.  He denied auditory or visual hallucinations, but clearly appeared paranoid.  He was then transferred to the [facility] emergency department later on 12/08/2020.  Seroquel 25 mg p.o. twice daily was started.  He was transferred to our facility on 12/10/2020.  During the interview today he is quite labile.  He talks about suicidality as well as homicidality.  He is paranoid about his mother.  He stated he had been on 20 different medicines in the past, and was unable to remember any of these medicines.  He stated he had not taken any medicines and what he thought was approximately a year.  Review of the electronic medical record revealed that he had been previously followed at the outpatient clinic at [facility].  His medications on the last visit (which appear to be June 2021) included Abilify 15 mg p.o. daily and  Depakote DR 500 mg p.o. twice daily.  Review of the electronic medical record revealed that he had also been previously treated with Geodon, Seroquel, Lamictal, fluoxetine.  He was transferred to our facility for evaluation and stabilization.  Associated Signs/Symptoms: Depression Symptoms:  anhedonia, insomnia, psychomotor agitation, fatigue, feelings of worthlessness/guilt, difficulty concentrating, suicidal thoughts without plan, anxiety, loss of energy/fatigue, disturbed sleep, Duration of Depression Symptoms: Greater than two weeks  (Hypo) Manic Symptoms:  Delusions, Distractibility, Elevated Mood, Hallucinations, Impulsivity, Irritable Mood, Labiality of Mood, Anxiety Symptoms:  Excessive Worry, Psychotic Symptoms:  Delusions, Hallucinations: Auditory Paranoia, PTSD Symptoms: Negative Total Time spent with patient: 30 minutes  Past Psychiatric History: Patient has a history of bipolar disorder.  He has been previously treated with several medications including Depakote, Abilify, Seroquel, Geodon, Lamictal.  He also has had fluoxetine at least at one occasion.  He stated this is his first psychiatric hospitalization.  I was unable to find any other admissions in the electronic medical record.  Is the patient at risk to self? Yes.    Has the patient been a risk to self in the past 6 months? No.  Has the patient been a risk to self within the distant past? No.  Is the patient a risk to others? Yes.    Has the patient been a risk to others in the past 6 months? No.  Has the patient been a risk to others within the distant past? No.   Prior Inpatient Therapy:   Prior Outpatient Therapy:    Alcohol Screening: 1. How often do you have  a drink containing alcohol?: 2 to 4 times a month 2. How many drinks containing alcohol do you have on a typical day when you are drinking?: 1 or 2 3. How often do you have six or more drinks on one occasion?: Never AUDIT-C Score:  2 Substance Abuse History in the last 12 months:  Yes.   Consequences of Substance Abuse: Medical Consequences:  Most likely contributed to worsening psychotic symptoms. Previous Psychotropic Medications: Yes  Psychological Evaluations: Yes  Past Medical History:  Past Medical History:  Diagnosis Date  . Abrasion of index finger 07/29/2013   left  . 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 GERD (gastroesophageal reflux disease)    no current med.  . Manic affective disorder with recurrent episode (HCC)   . Seasonal allergies    sore throat 07/29/2013    Past Surgical History:  Procedure Laterality Date  . FOOT FOREIGN BODY REMOVAL Right 01/19/2011  . KNEE ARTHROSCOPY Right 08/01/2013   Procedure: ARTHROSCOPY RIGHT KNEE, Chondroplasty, Excision of Plica;  Surgeon: Harvie Junior, MD;  Location: [facility];  Service: Orthopedics;  Laterality: Right;   Family History: History reviewed. No pertinent family history. Family Psychiatric  History: "Everyone in my family has mental illness". Tobacco Screening: Have you used any form of tobacco in the last 30 days? (Cigarettes, Smokeless Tobacco, Cigars, and/or Pipes): Yes Tobacco use, Select all that apply: 5 or more cigarettes per day Are you interested in Tobacco Cessation Medications?: No, patient refused Counseled patient on smoking cessation including recognizing danger situations, developing coping skills and basic information about quitting provided: Refused/Declined practical counseling Social History:  Social History   Substance and Sexual Activity  Alcohol Use Yes   Comment: every 3 days     Social History   Substance and Sexual Activity  Drug Use No    Additional Social History:                           Allergies:   Allergies  Allergen Reactions  . Trazodone And Nefazodone     Other reaction(s): Other priapism  . Augmentin [Amoxicillin-Pot Clavulanate]     Diarrhea    Lab Results:   Results for orders placed or performed during the hospital encounter of 12/09/20 (from the past 48 hour(s))  Lipid panel     Status: None   Collection Time: 12/10/20  6:29 AM  Result Value Ref Range   Cholesterol 139 0 - 200 mg/dL   Triglycerides 93 <119 mg/dL   HDL 41 >41 mg/dL   Total CHOL/HDL Ratio 3.4 RATIO   VLDL 19 0 - 40 mg/dL   LDL Cholesterol 79 0 - 99 mg/dL    Comment:        Total Cholesterol/HDL:CHD Risk Coronary Heart Disease Risk Table                     Men   Women  1/2 Average Risk   3.4   3.3  Average Risk       5.0   4.4  2 X Average Risk   9.6   7.1  3 X Average Risk  23.4   11.0        Use the calculated Patient Ratio above and the CHD Risk Table to determine the patient's CHD Risk.        ATP III CLASSIFICATION (LDL):  <100     mg/dL  Optimal  100-129  mg/dL   Near or Above                    Optimal  130-159  mg/dL   Borderline  782-956  mg/dL   High  >213     mg/dL   Very High Performed at [facility], [address] [place], [place] [address]   Hemoglobin A1c     Status: None   Collection Time: 12/10/20  6:29 AM  Result Value Ref Range   Hgb A1c MFr Bld 5.3 4.8 - 5.6 %    Comment: (NOTE) Pre diabetes:          5.7%-6.4%  Diabetes:              >6.4%  Glycemic control for   <7.0% adults with diabetes    Mean Plasma Glucose 105.41 mg/dL    Comment: Performed at [facility], [address] [place], [place] [address]  TSH     Status: None   Collection Time: 12/10/20  6:29 AM  Result Value Ref Range   TSH 0.790 0.350 - 4.500 uIU/mL    Comment: Performed by a 3rd Generation assay with a functional sensitivity of <=0.01 uIU/mL. Performed at [facility], [address], [place], [place] [address]     Blood Alcohol level:  Lab Results  Component Value Date   ETH <10 12/08/2020    Metabolic Disorder Labs:  Lab Results  Component Value Date   HGBA1C 5.3 12/10/2020   MPG 105.41 12/10/2020    No results found for: PROLACTIN Lab Results  Component Value Date   CHOL 139 12/10/2020   TRIG 93 12/10/2020   HDL 41 12/10/2020   CHOLHDL 3.4 12/10/2020   VLDL 19 12/10/2020   LDLCALC 79 12/10/2020    Current Medications: Current Facility-Administered Medications  Medication Dose Route Frequency Provider Last Rate Last Admin  . acetaminophen (TYLENOL) tablet 650 mg  650 mg Oral Q6H PRN White, Patrice L, NP      . alum & mag hydroxide-simeth (MAALOX/MYLANTA) 200-200-20 MG/5ML suspension 30 mL  30 mL Oral Q4H PRN White, Patrice L, NP      . carbamazepine (TEGRETOL) chewable tablet 200 mg  200 mg Oral BID Antonieta Pert, MD   200 mg at 12/10/20 0910  . hydrOXYzine (ATARAX/VISTARIL) tablet 25 mg  25 mg Oral TID PRN Liborio Nixon L, NP   25 mg at 12/10/20 0820  . OLANZapine zydis (ZYPREXA) disintegrating tablet 10 mg  10 mg Oral Q8H PRN Antonieta Pert, MD       And  . LORazepam (ATIVAN) tablet 1 mg  1 mg Oral Q6H PRN Antonieta Pert, MD       And  . ziprasidone (GEODON) injection 20 mg  20 mg Intramuscular Q6H PRN Antonieta Pert, MD      . magnesium hydroxide (MILK OF MAGNESIA) suspension 30 mL  30 mL Oral Daily PRN White, Patrice L, NP      . QUEtiapine (SEROQUEL) tablet 100 mg  100 mg Oral Daily Antonieta Pert, MD   100 mg at 12/10/20 0910  . QUEtiapine (SEROQUEL) tablet 200 mg  200 mg Oral QHS Antonieta Pert, MD       PTA Medications: Medications Prior to Admission  Medication Sig Dispense Refill Last Dose  . lidocaine (LIDODERM) 5 % Place 1 patch onto the skin daily. Remove & Discard patch within 12  hours or as directed by MD (Patient not taking: Reported on 12/09/2020) 10 patch 0   . nabumetone (RELAFEN) 750 MG tablet Take 1 tablet (750 mg total) by mouth 2 (two) times daily. (Patient not taking: Reported on 12/09/2020) 20 tablet 0     Musculoskeletal: Strength & Muscle Tone: within normal limits Gait & Station: normal Patient leans:  N/A            Psychiatric Specialty Exam:  Presentation  General Appearance: Disheveled  Eye Contact:Fleeting  Speech:Pressured  Speech Volume:Increased  Handedness:Right   Mood and Affect  Mood:Irritable; Labile  Affect:Labile   Thought Process  Thought Processes:Disorganized  Duration of Psychotic Symptoms: Greater than six months  Past Diagnosis of Schizophrenia or Psychoactive disorder: No  Descriptions of Associations:Tangential  Orientation:Full (Time, Place and Person)  Thought Content:Delusions; Paranoid Ideation  Hallucinations:Hallucinations: None Description of Auditory Hallucinations: States he hears whispers and all night he heard the whispers talking about him Description of Visual Hallucinations: states he still sees black shadows  Ideas of Reference:Delusions; Paranoia  Suicidal Thoughts:Suicidal Thoughts: Yes, Passive SI Active Intent and/or Plan: Without Intent  Homicidal Thoughts:Homicidal Thoughts: Yes, Passive HI Active Intent and/or Plan: With Intent; With Plan; With Means to Carry Out HI Passive Intent and/or Plan: Without Intent   Sensorium  Memory:Immediate Poor; Recent Poor; Remote Poor  Judgment:Impaired  Insight:Lacking   Executive Functions  Concentration:Poor  Attention Span:Poor  Recall:Poor  Fund of Knowledge:Poor  Language:Poor; Fair   Psychomotor Activity  Psychomotor Activity:Psychomotor Activity: Increased   Assets  Assets:Desire for Improvement; Resilience; Social Support   Sleep  Sleep:Sleep: Poor Number of Hours of Sleep: 4    Physical Exam: Physical Exam Vitals and nursing note reviewed.  HENT:     Head: Normocephalic and atraumatic.  Pulmonary:     Effort: Pulmonary effort is normal.  Neurological:     General: No focal deficit present.     Mental Status: He is alert.    ROS Blood pressure (!) 138/98, pulse 79, temperature 98.3 F (36.8 C), temperature source Oral,  resp. rate 18, height 5\' 8"  (1.727 m), weight 78 kg. Body mass index is 26.15 kg/m.  Treatment Plan Summary: Daily contact with patient to assess and evaluate symptoms and progress in treatment, Medication management and Plan : Patient is seen and examined.  Patient is a 30 year old male with a presumed past psychiatric history significant for bipolar disorder type I as well as cannabis dependence.  He will be admitted to the hospital.  He will be integrated in the milieu.  He will be encouraged to attend groups.  Even when he had previously been treated with Seroquel his dosage was much higher than 25 mg p.o. twice daily.  Because of his agitation, paranoia and the severity of his bipolar mania we will start Seroquel 100 mg p.o. daily and 200 mg p.o. nightly.  He has been on Depakote for a very long time.  When asked about Tegretol or lithium he just becomes more irritable, and stating "I been on 40 different medicines in the past, how do you expect me to remember everything".  We will start Tegretol chewable tablets 200 mg p.o. twice daily first dose now to make sure that we get something in his system.  I had considered lithium but he is on nonsteroidal anti-inflammatories and that would be contraindicated.  Review of his admission laboratories showed normal electrolytes including a creatinine at 0.81 and normal liver function enzymes.  Total lipid panel was  normal.  CBC was normal.  Including platelets at 289,000.  Differential was normal.  Hemoglobin A1c was 5.3.  TSH was normal at 0.790.  Respiratory panel was negative for influenza A, B and coronavirus.  His blood alcohol on admission was less than 10, drug screen was positive for marijuana.  There is a note in the chart from a family practice physician several years ago that he admitted to IV substance abuse, and had a hepatitis B as well as HIV done.  Those were negative.  It is unclear at this point whether or not the IV drug abuse was true, or part of  his delusion at that time.  We will contact his mother for collateral information.  His blood pressure is mildly elevated today.  Initially is 131/93, repeat is 138/98.  Pulse is between 69 and 79.  Pulse oximetry on room air was 99% on room air.  Hopefully we will be able to help him, and I am going to put an agitation protocol in for Zyprexa agitation protocol including Geodon.  Observation Level/Precautions:  15 minute checks  Laboratory:  Chemistry Profile  Psychotherapy:    Medications:    Consultations:    Discharge Concerns:    Estimated LOS:  Other:     Physician Treatment Plan for Primary Diagnosis: <principal problem not specified> Long Term Goal(s): Improvement in symptoms so as ready for discharge  Short Term Goals: Ability to identify changes in lifestyle to reduce recurrence of condition will improve, Ability to verbalize feelings will improve, Ability to disclose and discuss suicidal ideas, Ability to demonstrate self-control will improve, Ability to identify and develop effective coping behaviors will improve, Ability to maintain clinical measurements within normal limits will improve, Compliance with prescribed medications will improve and Ability to identify triggers associated with substance abuse/mental health issues will improve  Physician Treatment Plan for Secondary Diagnosis: Active Problems:   Bipolar disorder with psychotic features (HCC)  Long Term Goal(s): Improvement in symptoms so as ready for discharge  Short Term Goals: Ability to identify changes in lifestyle to reduce recurrence of condition will improve, Ability to verbalize feelings will improve, Ability to disclose and discuss suicidal ideas, Ability to demonstrate self-control will improve, Ability to identify and develop effective coping behaviors will improve, Ability to maintain clinical measurements within normal limits will improve, Compliance with prescribed medications will improve and Ability to  identify triggers associated with substance abuse/mental health issues will improve  I certify that inpatient services furnished can reasonably be expected to improve the patient's condition.    Antonieta PertGreg Lawson Thanvi Blincoe, MD 5/20/20221:17 PM

## 2020-12-10 NOTE — Progress Notes (Addendum)
Pt received PRN Vistaril 25 mg PO at 0820 for increased anxiety and irritability "I can't leave here now. My mom keep putting me in these short term places when I need a long time stay. I have to stay here". Pt observed to be calmer at 0920 when reassessed and was more interactive with both peers and staff. Pt presented with blunted affect, tangential and pressured speech, demanding and needy on initial interactions. Took his trash can out with his room and brought it to the nursing station "I see a fork in there and it makes me want to hurt myself, take it out". Pt attended scheduled groups when prompted and was engaged. Denies SI, HI and pain when assessed. Pt reports AVH "I do hear voices and see shadows but it's worse last night like every night at home. I even sleep with a fan to drown the voices out". Reports his appetite is good and  he slept well last night "Yeah, I slept good, I'm catching up on my sleep because at home the voices bother me a lot".  Emotional support and encouragement provided to pt. Q 15 minutes safety checks maintained without self harm gestures. All medications given as ordered. EKG done as ordered. Pt is medication compliant. Denies concerns at this time. Tolerates all meals and medications well without discomfort / side effects. Safety maintained on and off unit.

## 2020-12-10 NOTE — [facility] Group Notes (Signed)
[facility] Group Notes:  (Nursing/MHT/Case Management/Adjunct)  Date:  12/10/2020  Time:  0830  Type of Therapy:  Psychoeducational Skills  Participation Level:  Active  Participation Quality:  Attentive and Sharing  Affect:  Irritable and Labile  Cognitive:  Disorganized  Insight:  Lacking  Engagement in Group:  Lacking  Modes of Intervention:  Discussion  Summary of Progress/Problems: pt attended goals group and orientation group. The patient presented appropriate but quickly unravelled as he stated '' ''I'm just super confused, confused about this process and how I got here, I just want to live on my own in my own fucking domicile, and I was doing my best but sometimes I get mixed up and can't keep my mind on track. I mean I don't fucking get it how everyone just wants to shove pills down your throat, they've been giving me pills for eleven fucking years, fuck that shit it isn't working. And yeah, i'm angry, and I fucking hate how people have treated me and I fucking want to murder people but I do a good fucking job of keeping it together. I don't want to hurt nobody or anything but I think i'm doing a damn good job holding it in, and yet here I am cartered to and from crazy house to crazy house but pills aren't the answer. But thank you for having group, i'm going to dip my hands in all the group therapy, just not pills.''   Clinton Fisher 12/10/2020, 2:25 PM

## 2020-12-10 NOTE — Progress Notes (Signed)
Patient admitted from [facility] ED to [facility] Adult. He is Ox4, and agitated on approach, he denies SI but admits to HI against people. He presents under voluntary commitment. He reports his main stressors are he can't find a job, living with his mother and their strained relationship and not having real friends. During assessment he kept having to get redirected from using profanity. He is hyper verbal in conversation and requires some redirection at times. Searched upon admission on shift no contraband found.

## 2020-12-10 NOTE — [facility] Group Notes (Signed)
[facility] LCSW Group Therapy  5/20/202211:03 AM  Type of Therapy:Group Therapy   Summary of Progress/Problems:CSW was unable to complete group due to MHT group in process.    Aracelia Brinson A Kinnedy Mongiello 12/10/2020,11:03 AM 

## 2020-12-10 NOTE — Plan of Care (Signed)
Patient is newly admitted and requires stabilization of psychiatric symptoms.

## 2020-12-10 NOTE — Tx Team (Signed)
Interdisciplinary Treatment and Diagnostic Plan Update  12/10/2020 Time of Session:  Clinton Fisher MRN: [id number]  Principal Diagnosis: <principal problem not specified>  Secondary Diagnoses: Active Problems:   Bipolar disorder with psychotic features ([place])   Current Medications:  Current Facility-Administered Medications  Medication Dose Route Frequency Provider Last Rate Last Admin  . acetaminophen (TYLENOL) tablet 650 mg  650 mg Oral Q6H PRN White, Patrice L, NP      . alum & mag hydroxide-simeth (MAALOX/MYLANTA) 200-200-20 MG/5ML suspension 30 mL  30 mL Oral Q4H PRN White, Patrice L, NP      . carbamazepine (TEGRETOL) chewable tablet 200 mg  200 mg Oral BID Sharma Covert, MD   200 mg at 12/10/20 0910  . hydrOXYzine (ATARAX/VISTARIL) tablet 25 mg  25 mg Oral TID PRN Darrol Angel L, NP   25 mg at 12/10/20 0820  . OLANZapine zydis (ZYPREXA) disintegrating tablet 10 mg  10 mg Oral Q8H PRN Sharma Covert, MD       And  . LORazepam (ATIVAN) tablet 1 mg  1 mg Oral Q6H PRN Sharma Covert, MD       And  . ziprasidone (GEODON) injection 20 mg  20 mg Intramuscular Q6H PRN Sharma Covert, MD      . magnesium hydroxide (MILK OF MAGNESIA) suspension 30 mL  30 mL Oral Daily PRN White, Patrice L, NP      . QUEtiapine (SEROQUEL) tablet 100 mg  100 mg Oral Daily Sharma Covert, MD   100 mg at 12/10/20 0910  . QUEtiapine (SEROQUEL) tablet 200 mg  200 mg Oral QHS Sharma Covert, MD       PTA Medications: Medications Prior to Admission  Medication Sig Dispense Refill Last Dose  . lidocaine (LIDODERM) 5 % Place 1 patch onto the skin daily. Remove & Discard patch within 12 hours or as directed by MD (Patient not taking: Reported on 12/09/2020) 10 patch 0   . nabumetone (RELAFEN) 750 MG tablet Take 1 tablet (750 mg total) by mouth 2 (two) times daily. (Patient not taking: Reported on 12/09/2020) 20 tablet 0     Patient Stressors: Financial difficulties Marital or family  conflict Occupational concerns  Patient Strengths: Average or above average intelligence Supportive family/friends  Treatment Modalities: Medication Management, Group therapy, Case management,  1 to 1 session with clinician, Psychoeducation, Recreational therapy.   Physician Treatment Plan for Primary Diagnosis: <principal problem not specified> Long Term Goal(s): Improvement in symptoms so as ready for discharge Improvement in symptoms so as ready for discharge   Short Term Goals: Ability to identify changes in lifestyle to reduce recurrence of condition will improve Ability to verbalize feelings will improve Ability to disclose and discuss suicidal ideas Ability to demonstrate self-control will improve Ability to identify and develop effective coping behaviors will improve Ability to maintain clinical measurements within normal limits will improve Compliance with prescribed medications will improve Ability to identify triggers associated with substance abuse/mental health issues will improve Ability to identify changes in lifestyle to reduce recurrence of condition will improve Ability to verbalize feelings will improve Ability to disclose and discuss suicidal ideas Ability to demonstrate self-control will improve Ability to identify and develop effective coping behaviors will improve Ability to maintain clinical measurements within normal limits will improve Compliance with prescribed medications will improve Ability to identify triggers associated with substance abuse/mental health issues will improve  Medication Management: Evaluate patient's response, side effects, and tolerance of medication regimen.  Therapeutic Interventions: 1 to 1 sessions, Unit Group sessions and Medication administration.  Evaluation of Outcomes: Not Met  Physician Treatment Plan for Secondary Diagnosis: Active Problems:   Bipolar disorder with psychotic features ([place])  Long Term Goal(s):  Improvement in symptoms so as ready for discharge Improvement in symptoms so as ready for discharge   Short Term Goals: Ability to identify changes in lifestyle to reduce recurrence of condition will improve Ability to verbalize feelings will improve Ability to disclose and discuss suicidal ideas Ability to demonstrate self-control will improve Ability to identify and develop effective coping behaviors will improve Ability to maintain clinical measurements within normal limits will improve Compliance with prescribed medications will improve Ability to identify triggers associated with substance abuse/mental health issues will improve Ability to identify changes in lifestyle to reduce recurrence of condition will improve Ability to verbalize feelings will improve Ability to disclose and discuss suicidal ideas Ability to demonstrate self-control will improve Ability to identify and develop effective coping behaviors will improve Ability to maintain clinical measurements within normal limits will improve Compliance with prescribed medications will improve Ability to identify triggers associated with substance abuse/mental health issues will improve     Medication Management: Evaluate patient's response, side effects, and tolerance of medication regimen.  Therapeutic Interventions: 1 to 1 sessions, Unit Group sessions and Medication administration.  Evaluation of Outcomes: Not Met   RN Treatment Plan for Primary Diagnosis: <principal problem not specified> Long Term Goal(s): Knowledge of disease and therapeutic regimen to maintain health will improve  Short Term Goals: Ability to remain free from injury will improve, Ability to participate in decision making will improve and Ability to verbalize feelings will improve  Medication Management: RN will administer medications as ordered by provider, will assess and evaluate patient's response and provide education to patient for prescribed  medication. RN will report any adverse and/or side effects to prescribing provider.  Therapeutic Interventions: 1 on 1 counseling sessions, Psychoeducation, Medication administration, Evaluate responses to treatment, Monitor vital signs and CBGs as ordered, Perform/monitor CIWA, COWS, AIMS and Fall Risk screenings as ordered, Perform wound care treatments as ordered.  Evaluation of Outcomes: Not Met   LCSW Treatment Plan for Primary Diagnosis: <principal problem not specified> Long Term Goal(s): Safe transition to appropriate next level of care at discharge, Engage patient in therapeutic group addressing interpersonal concerns.  Short Term Goals: Engage patient in aftercare planning with referrals and resources, Increase social support and Increase ability to appropriately verbalize feelings  Therapeutic Interventions: Assess for all discharge needs, 1 to 1 time with Social worker, Explore available resources and support systems, Assess for adequacy in community support network, Educate family and significant other(s) on suicide prevention, Complete Psychosocial Assessment, Interpersonal group therapy.  Evaluation of Outcomes: Not Met   Progress in Treatment: Attending groups: No. Participating in groups: No. Taking medication as prescribed: Yes. Toleration medication: Yes. Family/Significant other contact made: No, will contact:  CSW will obtain consent Patient understands diagnosis: No. Discussing patient identified problems/goals with staff: Yes. Medical problems stabilized or resolved: Yes. Denies suicidal/homicidal ideation: Yes. Issues/concerns per patient self-inventory: No. Other: None  New problem(s) identified: No, Describe:  None  New Short Term/Long Term Goal(s):medication stabilization, elimination of SI thoughts, development of comprehensive mental wellness plan.  Patient Goals: "no goals...but to live on my own....to be heard for the first time in 13 years."    Discharge Plan or Barriers: Patient recently admitted. CSW will continue to follow and assess for appropriate referrals and possible  discharge planning.  Reason for Continuation of Hospitalization: Homicidal ideation [place] Medication stabilization Suicidal ideation  Estimated Length of Stay: 3-5 days  Attendees: Patient: Clinton Fisher 12/10/2020   Physician: Ethelene Browns, MD 12/10/2020   Nursing:  12/10/2020   RN Care Manager: 12/10/2020   Social Worker: Toney Reil, [place] 12/10/2020   Recreational Therapist:  12/10/2020   Other:  12/10/2020   Other:  12/10/2020   Other: 12/10/2020       Scribe for Treatment Team: Mliss Fritz, Latanya Presser 12/10/2020 2:11 PM

## 2020-12-10 NOTE — BHH Suicide Risk Assessment (Signed)
[facility] Admission Suicide Risk Assessment   Nursing information obtained from:    Demographic factors:  Male,Low socioeconomic status,Unemployed,Caucasian Current Mental Status:  NA Loss Factors:  NA Historical Factors:  NA Risk Reduction Factors:  Living with another person, especially a relative  Total Time spent with patient: 30 minutes Principal Problem: <principal problem not specified> Diagnosis:  Active Problems:   Bipolar disorder with psychotic features ([facility])  Subjective Data: Patient is seen and examined.  Patient is a 30 year old male with a reported past psychiatric history significant for bipolar disorder, most recently manic, severe with psychotic features who originally presented to the behavioral health urgent care center on 12/08/2020.  He was accompanied by his mother, and was noted to be anxious and fidgety.  It was felt that he was depressed.  He was labile and went from laughing to tearfulness quite rapidly.  He was noted to be tangential.  His mother was concerned for the patient's safety and keeps all firearms away from him.  At that point she stated that she does not trust him.  He also expressed suicidal and homicidal ideation.  He also uses marijuana on a daily basis.  He denied auditory or visual hallucinations, but clearly appeared paranoid.  He was then transferred to the [facility] emergency department later on 12/08/2020.  Seroquel 25 mg p.o. twice daily was started.  He was transferred to our facility on 12/10/2020.  During the interview today he is quite labile.  He talks about suicidality as well as homicidality.  He is paranoid about his mother.  He stated he had been on 20 different medicines in the past, and was unable to remember any of these medicines.  He stated he had not taken any medicines and what he thought was approximately a year.  Review of the electronic medical record revealed that he had been previously followed at the outpatient clinic  at [facility].  His medications on the last visit (which appear to be June 2021) included Abilify 15 mg p.o. daily and Depakote DR 500 mg p.o. twice daily.  Review of the electronic medical record revealed that he had also been previously treated with Geodon, Seroquel, Lamictal, fluoxetine.  He was transferred to our facility for evaluation and stabilization.  Continued Clinical Symptoms:    The "Alcohol Use Disorders Identification Test", Guidelines for Use in Primary Care, Second Edition.  World Science writer [facility]). Score between 0-7:  no or low risk or alcohol related problems. Score between 8-15:  moderate risk of alcohol related problems. Score between 16-19:  high risk of alcohol related problems. Score 20 or above:  warrants further diagnostic evaluation for alcohol dependence and treatment.   CLINICAL FACTORS:   Bipolar Disorder:   Mixed State Alcohol/Substance Abuse/Dependencies Currently Psychotic   Musculoskeletal: Strength & Muscle Tone: within normal limits Gait & Station: normal Patient leans: N/A  Psychiatric Specialty Exam:  Presentation  General Appearance: Disheveled  Eye Contact:Fleeting  Speech:Pressured  Speech Volume:Increased  Handedness:Right   Mood and Affect  Mood:Irritable; Labile  Affect:Labile   Thought Process  Thought Processes:Disorganized  Descriptions of Associations:Tangential  Orientation:Full (Time, Place and Person)  Thought Content:Delusions; Paranoid Ideation  History of Schizophrenia/Schizoaffective disorder:No  Duration of Psychotic Symptoms:Greater than six months  Hallucinations:Hallucinations: None Description of Auditory Hallucinations: States he hears whispers and all night he heard the whispers talking about him Description of Visual Hallucinations: states he still sees black shadows  Ideas of Reference:Delusions; Paranoia  Suicidal Thoughts:Suicidal Thoughts: Yes,  Passive SI Active Intent and/or  Plan: Without Intent  Homicidal Thoughts:Homicidal Thoughts: Yes, Passive HI Active Intent and/or Plan: With Intent; With Plan; With Means to Carry Out HI Passive Intent and/or Plan: Without Intent   Sensorium  Memory:Immediate Poor; Recent Poor; Remote Poor  Judgment:Impaired  Insight:Lacking   Executive Functions  Concentration:Poor  Attention Span:Poor  Recall:Poor  Fund of Knowledge:Poor  Language:Poor; Fair   Psychomotor Activity  Psychomotor Activity:Psychomotor Activity: Increased   Assets  Assets:Desire for Improvement; Resilience; Social Support   Sleep  Sleep:Sleep: Poor Number of Hours of Sleep: 4    Physical Exam: Physical Exam Vitals and nursing note reviewed.  HENT:     Head: Normocephalic and atraumatic.  Pulmonary:     Effort: Pulmonary effort is normal.  Neurological:     General: No focal deficit present.     Mental Status: He is alert and oriented to person, place, and time.    Review of Systems  Musculoskeletal: Positive for myalgias.  All other systems reviewed and are negative.  Blood pressure (!) 138/98, pulse 79, temperature 98.3 F (36.8 C), temperature source Oral, resp. rate 18, height 5\' 8"  (1.727 m), weight 78 kg. Body mass index is 26.15 kg/m.   COGNITIVE FEATURES THAT CONTRIBUTE TO RISK:  Thought constriction (tunnel vision)    SUICIDE RISK:   Severe:  Frequent, intense, and enduring suicidal ideation, specific plan, no subjective intent, but some objective markers of intent (i.e., choice of lethal method), the method is accessible, some limited preparatory behavior, evidence of impaired self-control, severe dysphoria/symptomatology, multiple risk factors present, and few if any protective factors, particularly a lack of social support.  PLAN OF CARE: Patient is seen and examined.  Patient is a 30 year old male with a presumed past psychiatric history significant for bipolar disorder type I as well as cannabis  dependence.  He will be admitted to the hospital.  He will be integrated in the milieu.  He will be encouraged to attend groups.  Even when he had previously been treated with Seroquel his dosage was much higher than 25 mg p.o. twice daily.  Because of his agitation, paranoia and the severity of his bipolar mania we will start Seroquel 100 mg p.o. daily and 200 mg p.o. nightly.  He has been on Depakote for a very long time.  When asked about Tegretol or lithium he just becomes more irritable, and stating "I been on 40 different medicines in the past, how do you expect me to remember everything".  We will start Tegretol chewable tablets 200 mg p.o. twice daily first dose now to make sure that we get something in his system.  I had considered lithium but he is on nonsteroidal anti-inflammatories and that would be contraindicated.  Review of his admission laboratories showed normal electrolytes including a creatinine at 0.81 and normal liver function enzymes.  Total lipid panel was normal.  CBC was normal.  Including platelets at 289,000.  Differential was normal.  Hemoglobin A1c was 5.3.  TSH was normal at 0.790.  Respiratory panel was negative for influenza A, B and coronavirus.  His blood alcohol on admission was less than 10, drug screen was positive for marijuana.  There is a note in the chart from a family practice physician several years ago that he admitted to IV substance abuse, and had a hepatitis B as well as HIV done.  Those were negative.  It is unclear at this point whether or not the IV drug abuse was true,  or part of his delusion at that time.  We will contact his mother for collateral information.  His blood pressure is mildly elevated today.  Initially is 131/93, repeat is 138/98.  Pulse is between 69 and 79.  Pulse oximetry on room air was 99% on room air.  Hopefully we will be able to help him, and I am going to put an agitation protocol in for Zyprexa agitation protocol including Geodon.  I  certify that inpatient services furnished can reasonably be expected to improve the patient's condition.   Antonieta Pert, MD 12/10/2020, 9:20 AM

## 2020-12-10 NOTE — Progress Notes (Signed)
Recreation Therapy Notes  Date: 12/10/2020 Time: 930a  Topic: Stress Management   Goal Area(s) Addresses:  Patient will review and complete packet supporting identification of stressors and and techniques to combat compounding stress.   Intervention: Independent Workbook   Education: Stress, Time Management, Coping Skills, Lifestyle Changes, Discharge Planning   Comments: LRT provided pt a workbook reviewing stress management concepts and offering an opportunity to create a plan to address stressors. Pt given the option to complete the packet in the dayroom with RN/MHT staff and peers or at their own pace in their room.   Berthel Bagnall, LRT/CTRS 

## 2020-12-10 NOTE — BHH Group Notes (Signed)
SPIRITUALITY GROUP NOTE   Spirituality group facilitated by Kathleen Argue, BCC.   Group Description: Group focused on topic of hope. Patients participated in facilitated discussion around topic, connecting with one another around experiences and definitions for hope. Group members engaged with visual explorer photos, reflecting on what hope looks like for them today. Group engaged in discussion around how their definitions of hope are present today in hospital.   Modalities: Psycho-social ed, Adlerian, Narrative, MI   Patient Progress:  Clinton Fisher was engaged in group.  Before group began he stated that he had just been given a medication that made him feel disassociated and there were moments when he was unfocused. He participated in the conversation and was able to identify some areas of hope for himself.  He became agitated when talking about his mother.  Chaplain Dyanne Carrel, Bcc Pager, [phone] 6:03 PM

## 2020-12-10 NOTE — Progress Notes (Signed)
Adult Psychoeducational Group Note  Date:  12/10/2020 Time:  11:07 PM  Group Topic/Focus:  Wrap-Up Group:   The focus of this group is to help patients review their daily goal of treatment and discuss progress on daily workbooks.  Participation Level:  Did Not Attend  Participation Quality:  Did Not Attend  Affect:  Did Not Attend  Cognitive:  Did Not Attend  Insight: None  Engagement in Group:  Did Not Attend  Modes of Intervention:  Did Not Attend  Additional Comments:  Pt did not attend evening wrap up group tonight.  [name] 12/10/2020, 11:07 PM

## 2020-12-10 NOTE — Tx Team (Signed)
Initial Treatment Plan 12/10/2020 4:30 AM Clinton Fisher DEY:[id number]    PATIENT STRESSORS: Financial difficulties Marital or family conflict Occupational concerns   PATIENT STRENGTHS: Average or above average intelligence Supportive family/friends   PATIENT IDENTIFIED PROBLEMS: Homicidal Thoughts  Depression                   DISCHARGE CRITERIA:  Improved stabilization in mood, thinking, and/or behavior Verbal commitment to aftercare and medication compliance  PRELIMINARY DISCHARGE PLAN: Return to previous living arrangement  PATIENT/FAMILY INVOLVEMENT: This treatment plan has been presented to and reviewed with the patient, Clinton Fisher, and/or family member  The patient and family have been given the opportunity to ask questions and make suggestions.  Elbert Ewings, RN 12/10/2020, 4:30 AM

## 2020-12-11 DIAGNOSIS — F319 Bipolar disorder, unspecified: Secondary | ICD-10-CM | POA: Diagnosis not present

## 2020-12-11 MED ORDER — QUETIAPINE FUMARATE 400 MG PO TABS
400.0000 mg | ORAL_TABLET | Freq: Every day | ORAL | Status: DC
  Filled 2020-12-11 (×2): qty 1

## 2020-12-11 MED ORDER — NICOTINE POLACRILEX 2 MG MT GUM
2.0000 mg | CHEWING_GUM | OROMUCOSAL | Status: DC | PRN
  Filled 2020-12-11: qty 1

## 2020-12-11 MED ORDER — QUETIAPINE FUMARATE 300 MG PO TABS
300.0000 mg | ORAL_TABLET | Freq: Every day | ORAL | Status: DC
  Administered 2020-12-12: 300 mg via ORAL
  Filled 2020-12-11 (×4): qty 1

## 2020-12-11 MED ORDER — QUETIAPINE FUMARATE 200 MG PO TABS
200.0000 mg | ORAL_TABLET | Freq: Every day | ORAL | Status: DC
  Administered 2020-12-11: 200 mg via ORAL
  Filled 2020-12-11 (×4): qty 1

## 2020-12-11 MED ORDER — QUETIAPINE FUMARATE 300 MG PO TABS
600.0000 mg | ORAL_TABLET | Freq: Every day | ORAL | Status: DC
  Administered 2020-12-11: 600 mg via ORAL
  Filled 2020-12-11 (×3): qty 2

## 2020-12-11 MED ORDER — CARBAMAZEPINE 100 MG PO CHEW
300.0000 mg | CHEWABLE_TABLET | Freq: Two times a day (BID) | ORAL | Status: DC
  Administered 2020-12-11 – 2020-12-12 (×2): 300 mg via ORAL
  Filled 2020-12-11 (×7): qty 3

## 2020-12-11 NOTE — Progress Notes (Signed)
   12/11/20 1200  Psych Admission Type (Psych Patients Only)  Admission Status Voluntary  Psychosocial Assessment  Patient Complaints Self-harm thoughts  Eye Contact Intense;Staring  Facial Expression Blank;Anxious  Affect Appropriate to circumstance  Speech Logical/coherent;Pressured  Interaction Defensive;Intrusive  Motor Activity Fidgety;Restless  Appearance/Hygiene Unremarkable  Behavior Characteristics Cooperative  Mood Preoccupied  Aggressive Behavior  Targets Self  Type of Behavior Verbal  Effect No apparent injury  Thought Process  Coherency Circumstantial  Content Blaming others;Preoccupation  Delusions None reported or observed  Perception Hallucinations  Hallucination Auditory;Visual ("I see shadows & hear voices. It's worse at night")  Judgment Limited  Confusion None  Danger to Self  Current suicidal ideation? Denies  Danger to Others  Danger to Others None reported or observed  Dar Note: Patient presents with anxious affect and mood.  Reports suicidal thoughts and contracts for safety.  Routine safety checks maintained.  Attended group and participated.  Patient is safe on and off the unit.

## 2020-12-11 NOTE — BHH Group Notes (Signed)
.  Psychoeducational Group Note  Date: 12/11/2020 Time: 0900-1000    Goal Setting   Purpose of Group: This group helps to provide patients with the steps of setting a goal that is specific, measurable, attainable, realistic and time specific. A discussion on how we keep ourselves stuck with negative self talk.    Participation Level:  Active  Participation Quality:  lacking  Affect:  Appropriate  Cognitive:  lacking  Insight:  lacking  Engagement in Group:  Engaged  Additional Comments:  Pt was sitting in the room when group started with on sock on and one sock off. States "I broke my f___ing toe and not one M____r F_____r here will help me with it. I need to talk to that f____g doctor" Pt was using foul language in the group, which he, himself corrected prior to the group ending. States that he feels if someone does something to him, he should do the same thing back. Said, "I am a f____g son of a b___h and I am as angry as hell. I am who I am." Pt was cooperative in the group and responded appropriately to questions asked  Dione Housekeeper

## 2020-12-11 NOTE — Progress Notes (Deleted)
BHH Group Notes:  (Nursing/MHT/Case Management/Adjunct)  Date:  12/11/2020  Time:  11:20 PM  Type of Therapy:  Group Therapy  Participation Level:  Did Not Attend  Participation Quality:  NA  Affect:  NA  Cognitive:  NA  Insight:  None  Engagement in Group:  NA  Modes of Intervention:  NA  Summary of Progress/Problems:  Lorita Officer 12/11/2020, 11:20 PM
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
## 2020-12-11 NOTE — Progress Notes (Signed)
Adult Psychoeducational Group Note  Date:  12/11/2020 Time:  8:18 PM  Group Topic/Focus:  Wrap-Up Group:   The focus of this group is to help patients review their daily goal of treatment and discuss progress on daily workbooks.  Participation Level:  Active  Participation Quality:  Appropriate  Affect:  Anxious and Appropriate  Cognitive:  Appropriate  Insight: Appropriate  Engagement in Group:  Limited  Modes of Intervention:  Discussion  Additional Comments: Pt stated his goal for today was to focus on his treatment plan. Pt stated he accomplished his goal today. Pt stated he talked with his doctor and social worker about his care today. Pt stated morning was rough but after speaking with the Social Worker his day improved. Pt rated his overall day a 6 out of 10. Pt stated he was able to contact his mother and friend today which improved his overall day. Pt stated been able to go outside with peers today improved his day. Pt stated he felt better about himself today. Pt stated he attend all groups held today. Pt stated he attend all meals held today. Pt stated he took all medications provided today. Pt stated his appetite was pretty good today. Pt rated sleep last night was fair. Pt stated the goal tonight was to get some rest. Pt stated he had some physical pain tonight. Pt stated he had some severe pain in his right knee and right foot. Pt nurse was made aware of the situation. Pt deny visual hallucinations issues tonight. Pt admitted to experiencing auditory issues tonight. Pt nurse was made aware of the situation. Pt admitted to having thoughts of harming himself or others. Pt stated he could contract for safety. Pt stated he would alert staff if anything changed.   [name] 12/11/2020, 8:18 PM

## 2020-12-11 NOTE — Progress Notes (Signed)
[facility] MD Progress Note  12/11/2020 1:54 PM Clinton Fisher  MRN:  [id number] Subjective: Patient is a 30 year old male with a reported past psychiatric history significant for bipolar disorder; most recently manic, severe with psychotic features who originally presented to the behavioral health urgent care center on 12/08/2020.  He was labile, tangential, and the mother was concerned about the patient's safety.  She also stated she did not trust him not to hurt her.  He was admitted to the hospital for evaluation and stabilization.  Objective: Patient is seen and examined.  Patient is a 30 year old male with the above-stated past psychiatric history who is seen in follow-up.  He is essentially unchanged from yesterday.  He continues to endorse suicidal ideation.  He stated that if he was sent home "to that woman" he would not know what he would do.  After I mention that we would notify her if he was threatening her, he stated "I never said that".  He has multiple somatic complaints today.  He stated he broke his pinky toe.  We discussed that.  His current medications include Tegretol 200 mg p.o. twice daily and Seroquel 200 mg p.o. daily and 400 mg p.o. nightly.  His blood pressure is a little low today.  Its 98/65.  Pulse is 65.  Pulse oximetry is 100% on room air.  Review of the nursing notes unfortunately did not record his sleep last night.  He also admitted to auditory hallucinations.  He was argumentative and agitated throughout the evening in the a.m.  TSH from 5/20 was 0.790.  EKG showed a sinus bradycardia with a normal QTc interval.  His hemoglobin A1c is 5.3.  Lipid panel was essentially normal.  Principal Problem: <principal problem not specified> Diagnosis: Active Problems:   Bipolar disorder with psychotic features (HCC)  Total Time spent with patient: 20 minutes  Past Psychiatric History: See admission H&P  Past Medical History:  Past Medical History:  Diagnosis Date  . Abrasion of  index finger 07/29/2013   left  . 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 GERD (gastroesophageal reflux disease)    no current med.  . Manic affective disorder with recurrent episode (HCC)   . Seasonal allergies    sore throat 07/29/2013    Past Surgical History:  Procedure Laterality Date  . FOOT FOREIGN BODY REMOVAL Right 01/19/2011  . KNEE ARTHROSCOPY Right 08/01/2013   Procedure: ARTHROSCOPY RIGHT KNEE, Chondroplasty, Excision of Plica;  Surgeon: Harvie Junior, MD;  Location: [facility];  Service: Orthopedics;  Laterality: Right;   Family History: History reviewed. No pertinent family history. Family Psychiatric  History: See admission H&P Social History:  Social History   Substance and Sexual Activity  Alcohol Use Yes   Comment: every 3 days     Social History   Substance and Sexual Activity  Drug Use No    Social History   Socioeconomic History  . Marital status: Single    Spouse name: Not on file  . Number of children: Not on file  . Years of education: Not on file  . Highest education level: Not on file  Occupational History  . Not on file  Tobacco Use  . Smoking status: Current Every Day Smoker  . Smokeless tobacco: Never Used  Substance and Sexual Activity  . Alcohol use: Yes    Comment: every 3 days  . Drug use: No  . Sexual activity: Not on file  Other Topics Concern  .  Not on file  Social History Narrative  . Not on file   Social Determinants of Health   Financial Resource Strain: Not on file  Food Insecurity: Not on file  Transportation Needs: Not on file  Physical Activity: Not on file  Stress: Not on file  Social Connections: Not on file   Additional Social History:                         Sleep: Fair  Appetite:  Good  Current Medications: Current Facility-Administered Medications  Medication Dose Route Frequency Provider Last Rate Last Admin  . acetaminophen (TYLENOL) tablet 650 mg  650 mg Oral Q6H PRN  White, Patrice L, NP      . alum & mag hydroxide-simeth (MAALOX/MYLANTA) 200-200-20 MG/5ML suspension 30 mL  30 mL Oral Q4H PRN White, Patrice L, NP      . carbamazepine (TEGRETOL) chewable tablet 200 mg  200 mg Oral BID Antonieta Pertlary, Jenniffer Vessels Lawson, MD   200 mg at 12/11/20 0904  . hydrOXYzine (ATARAX/VISTARIL) tablet 25 mg  25 mg Oral TID PRN White, Patrice L, NP   25 mg at 12/10/20 0820  . OLANZapine zydis (ZYPREXA) disintegrating tablet 10 mg  10 mg Oral Q8H PRN Antonieta Pertlary, Kohner Orlick Lawson, MD       And  . LORazepam (ATIVAN) tablet 1 mg  1 mg Oral Q6H PRN Antonieta Pertlary, [place] Kievit Lawson, MD       And  . ziprasidone (GEODON) injection 20 mg  20 mg Intramuscular Q6H PRN Antonieta Pertlary, Anastashia Westerfeld Lawson, MD      . magnesium hydroxide (MILK OF MAGNESIA) suspension 30 mL  30 mL Oral Daily PRN White, Patrice L, NP      . nicotine polacrilex (NICORETTE) gum 2 mg  2 mg Oral PRN Antonieta Pertlary, Vicy Medico Lawson, MD      . QUEtiapine (SEROQUEL) tablet 200 mg  200 mg Oral Daily Antonieta Pertlary, Lonald Troiani Lawson, MD   200 mg at 12/11/20 [id number]  . QUEtiapine (SEROQUEL) tablet 400 mg  400 mg Oral QHS Antonieta Pertlary, Bronc Brosseau Lawson, MD        Lab Results:  Results for orders placed or performed during the hospital encounter of 12/09/20 (from the past 48 hour(s))  Lipid panel     Status: None   Collection Time: 12/10/20  6:29 AM  Result Value Ref Range   Cholesterol 139 0 - 200 mg/dL   Triglycerides 93 <960<150 mg/dL   HDL 41 >45>40 mg/dL   Total CHOL/HDL Ratio 3.4 RATIO   VLDL 19 0 - 40 mg/dL   LDL Cholesterol 79 0 - 99 mg/dL    Comment:        Total Cholesterol/HDL:CHD Risk Coronary Heart Disease Risk Table                     Men   Women  1/2 Average Risk   3.4   3.3  Average Risk       5.0   4.4  2 X Average Risk   9.6   7.1  3 X Average Risk  23.4   11.0        Use the calculated Patient Ratio above and the CHD Risk Table to determine the patient's CHD Risk.        ATP III CLASSIFICATION (LDL):  <100     mg/dL   Optimal  [id number]  mg/dL   Near or Above  Optimal  130-159  mg/dL   Borderline  416-606  mg/dL   High  >301     mg/dL   Very High Performed at [facility], [address], [place], [place] [address]   Hemoglobin A1c     Status: None   Collection Time: 12/10/20  6:29 AM  Result Value Ref Range   Hgb A1c MFr Bld 5.3 4.8 - 5.6 %    Comment: (NOTE) Pre diabetes:          5.7%-6.4%  Diabetes:              >6.4%  Glycemic control for   <7.0% adults with diabetes    Mean Plasma Glucose 105.41 mg/dL    Comment: Performed at [facility], [address] [place], [place] [address]  TSH     Status: None   Collection Time: 12/10/20  6:29 AM  Result Value Ref Range   TSH 0.790 0.350 - 4.500 uIU/mL    Comment: Performed by a 3rd Generation assay with a functional sensitivity of <=0.01 uIU/mL. Performed at [facility], [address] [place], [place] [address]     Blood Alcohol level:  Lab Results  Component Value Date   ETH <10 12/08/2020    Metabolic Disorder Labs: Lab Results  Component Value Date   HGBA1C 5.3 12/10/2020   MPG 105.41 12/10/2020   No results found for: PROLACTIN Lab Results  Component Value Date   CHOL 139 12/10/2020   TRIG 93 12/10/2020   HDL 41 12/10/2020   CHOLHDL 3.4 12/10/2020   VLDL 19 12/10/2020   LDLCALC 79 12/10/2020    Physical Findings: AIMS: Facial and Oral Movements Muscles of Facial Expression: None, normal Lips and Perioral Area: None, normal Jaw: None, normal Tongue: None, normal,Extremity Movements Upper (arms, wrists, hands, fingers): None, normal Lower (legs, knees, ankles, toes): None, normal, Trunk Movements Neck, shoulders, hips: None, normal, Overall Severity Severity of abnormal movements (highest score from questions above): None, normal Incapacitation due to abnormal movements: None, normal Patient's awareness of abnormal movements (rate only patient's report): No Awareness, Dental Status Current problems with teeth  and/or dentures?: No Does patient usually wear dentures?: No  CIWA:    COWS:     Musculoskeletal: Strength & Muscle Tone: within normal limits Gait & Station: normal Patient leans: N/A  Psychiatric Specialty Exam:  Presentation  General Appearance: Disheveled  Eye Contact:Fleeting  Speech:Pressured  Speech Volume:Increased  Handedness:Right   Mood and Affect  Mood:Labile; Irritable  Affect:Labile   Thought Process  Thought Processes:Goal Directed  Descriptions of Associations:Tangential  Orientation:Full (Time, Place and Person)  Thought Content:Delusions; Paranoid Ideation; Rumination; Tangential  History of Schizophrenia/Schizoaffective disorder:No  Duration of Psychotic Symptoms:Greater than six months  Hallucinations:Hallucinations: Auditory  Ideas of Reference:Paranoia  Suicidal Thoughts:Suicidal Thoughts: Yes, Active SI Active Intent and/or Plan: Without Intent  Homicidal Thoughts:Homicidal Thoughts: Yes, Passive HI Active Intent and/or Plan: Without Intent HI Passive Intent and/or Plan: Without Intent   Sensorium  Memory:Immediate Poor; Recent Poor; Remote Poor  Judgment:Impaired  Insight:Lacking   Executive Functions  Concentration:Fair  Attention Span:Poor  Recall:Poor  Fund of Knowledge:Poor  Language:Poor   Psychomotor Activity  Psychomotor Activity:Psychomotor Activity: Restlessness   Assets  Assets:Desire for Improvement; Resilience   Sleep  Sleep:Sleep: Fair    Physical Exam: Physical Exam Vitals and nursing note reviewed.  HENT:     Head: Normocephalic and atraumatic.  Pulmonary:     Effort: Pulmonary effort is  normal.  Neurological:     General: No focal deficit present.     Mental Status: He is alert and oriented to person, place, and time.    Review of Systems  Musculoskeletal: Positive for joint pain and myalgias.  Psychiatric/Behavioral: Positive for suicidal ideas.  All other systems reviewed  and are negative.  Blood pressure 98/65, pulse 63, temperature 98.3 F (36.8 C), temperature source Oral, resp. rate 18, height 5\' 8"  (1.727 m), weight 78 kg, SpO2 100 %. Body mass index is 26.15 kg/m.   Treatment Plan Summary: Daily contact with patient to assess and evaluate symptoms and progress in treatment, Medication management and Plan : Patient is seen and examined.  Patient is a 30 year old male with the above-stated past psychiatric history who is seen in follow-up.  Diagnosis: 1.  Bipolar disorder, most recently manic, severe with psychotic features 2.  Cannabis dependence versus cannabis use disorder.  Pertinent findings on examination today: 1.  Essentially the same as yesterday. 2.  He continues to have suicidal and homicidal ideation. 3.  He continues to admit to auditory hallucinations. 4.  He remains pressured, tangential, labile.  Plan: 1.  Increase Tegretol to 300 mg p.o. twice daily for mood stability. 2.  Continue hydroxyzine 25 mg p.o. 3 times daily as needed anxiety. 3.  Continue Zyprexa agitation protocol as needed. 4.  Increase Seroquel to 300 mg p.o. daily and 600 mg p.o. nightly 5.  Monitor blood pressure 6.  If necessary once he is far more stable than he has right now we will consider an x-ray of his foot if he continues to complain about it. 7.  Disposition planning-in progress 38, MD 12/11/2020, 1:54 PM

## 2020-12-11 NOTE — Progress Notes (Signed)
D: Writer and staff attempted to get patient to come out of his room. Pt refused to attend group as well as med pass, by what appeared to be pretending to snore. Writer went to the pt's room for med pass. When asked about his day pt stated, "alright". When asked if he was having thoughts of SI or HI pt stated, "suicidal thoughts are constant". Then stated, "thoughts of hurting others comes in spits". Writer believes pt is referring to "spurts". Pt began to describe the voices that he hears. Stated that one voice will say, "you need to buy some milk", then another would say "no, you need to buy some eggs". Pt stated that before he knows it there are lots of voices telling him different things at the same time and they become very loud. Writer discussed the seroquel she was giving. Pt has no other questions or concerns.   A:  Support and encouragement was offered. 15 min checks continued for safety.  R: Pt remains safe.

## 2020-12-11 NOTE — Progress Notes (Signed)
D: Writer observed pt in the dayroom interacting with his peers. Writer mentioned she was glad to see pt in the dayroom, versus laying in his bed all night. Pt stated, "Yesterday the medications had me fucked up". Stated he used to be a bad person when he was on xanax and was hoping that the medications wouldn't make him "cuss somebody out". However, Clinical research associate overheard pt giving support to a peer by encouraging him to take his medications. Stated, "these are good people and they won't lie to you. If you give them a chance you'll feel much better when you leave and have a new outlook on life". Pt has no questions or concerns.   A: Writer informed pt that once the medications are in his system some of the symptoms will probably wear off. Encouraged him to continue to take.  Support and encouragement was offered. 15 min checks continued for safety.  R: Pt remains safe.

## 2020-12-11 NOTE — Progress Notes (Signed)
Writer was informed by the mht that the patient was still awake in his room after several checks. Writer asked pt if he would like to get something that may help him relax and get some rest, and he obliged. However, after several minutes at the medication window the pt informed the writer that he hurt the little toe on his left foot. He asked that a provider look at it, stating, "that's why I don't like taking all this medicine because it makes it so I can't feel nothing". Stated, I may have broken my toe because I heard it pop". Writer looked at pt's toe but didn't notice any swelling or redness at the time. Pt stated, "I feel light headed like I'm getting ready to have a panic attack". Writer encouraged the patient to sit on the floor. Pt sat on the floor then preceded to lay down stating the floor made him feel better. Pt stated he felt nauseous and began dry heaving. VS were taken and was 98/65. Pt wouldn't allow temp. After several minutes on the floor writer encouraged pt to drink gatorade informing him of his low bp and his need for fluids. Writer also encouraged pt to get up slowly when getting out of bed especially after receiving medications. Pt acknowledged understanding.

## 2020-12-12 DIAGNOSIS — F319 Bipolar disorder, unspecified: Secondary | ICD-10-CM | POA: Diagnosis not present

## 2020-12-12 MED ORDER — FLUPHENAZINE HCL 5 MG PO TABS
5.0000 mg | ORAL_TABLET | Freq: Two times a day (BID) | ORAL | Status: DC
  Administered 2020-12-12 – 2020-12-13 (×2): 5 mg via ORAL
  Filled 2020-12-12 (×5): qty 1

## 2020-12-12 MED ORDER — QUETIAPINE FUMARATE 400 MG PO TABS
400.0000 mg | ORAL_TABLET | Freq: Every day | ORAL | Status: DC
  Administered 2020-12-12: 400 mg via ORAL
  Filled 2020-12-12: qty 1
  Filled 2020-12-12: qty 2
  Filled 2020-12-12: qty 1

## 2020-12-12 MED ORDER — CARBAMAZEPINE 100 MG PO CHEW
200.0000 mg | CHEWABLE_TABLET | Freq: Two times a day (BID) | ORAL | Status: DC
  Administered 2020-12-12 – 2020-12-13 (×2): 200 mg via ORAL
  Filled 2020-12-12 (×4): qty 2

## 2020-12-12 MED ORDER — QUETIAPINE FUMARATE 200 MG PO TABS
200.0000 mg | ORAL_TABLET | Freq: Every day | ORAL | Status: DC
  Administered 2020-12-13: 200 mg via ORAL
  Filled 2020-12-12 (×2): qty 1

## 2020-12-12 MED ORDER — NICOTINE 21 MG/24HR TD PT24
21.0000 mg | MEDICATED_PATCH | Freq: Every day | TRANSDERMAL | Status: DC
  Administered 2020-12-12 – 2020-12-20 (×8): 21 mg via TRANSDERMAL
  Filled 2020-12-12 (×14): qty 1

## 2020-12-12 NOTE — Progress Notes (Signed)
Pt shared how he met with the social worker and doctor today and told them how he is not going to be able to take his medications by mouth as scheduled upon d/c. Pt said that he has a "scattered brain" and may be able to take his medications by mouth as scheduled for only 1 week. He said that he told them how he prefers to take a long acting injection instead. Pt said that his understanding with his Mother has improved and he has also talked to her about this. He said that his mother was asking him to work with the "bare minimum" by giving him $10 to drive and find a job for example. Pt said that he didn't like the previous medication the doctor had prescribed because it made him feel "drunk" and "dizzy." Educated pt about his bedtime medications tonight. He does endorse AH of voices that he hears every now and then. He said that "they talk shit about other people" or he hears his name being called. Pt denies SI/HI and VH. He verbally contracts for safety. Pt agrees to notify staff immediately for any thoughts of hurting himself or anyone else. Active listening, reassurance, and support provided. Medications administered as ordered by provider. Q 15 min safety checks continue. Pt's safety has been maintained.   12/12/20 2000  Psych Admission Type (Psych Patients Only)  Admission Status Voluntary  Psychosocial Assessment  Patient Complaints Anxiety;Irritability;Worrying  Eye Contact Fair  Facial Expression Anxious  Affect Anxious;Appropriate to circumstance  Speech Logical/coherent;Pressured;Tangential  Interaction Assertive  Motor Activity Slow;Fidgety  Appearance/Hygiene Unremarkable  Behavior Characteristics Cooperative;Appropriate to situation;Anxious;Fidgety  Mood Anxious;Pleasant  Thought Process  Coherency Tangential  Content Blaming others  Delusions None reported or observed  Perception Hallucinations  Hallucination Auditory  Judgment Poor  Confusion None  Danger to Self  Current  suicidal ideation? Denies  Self-Injurious Behavior No self-injurious ideation or behavior indicators observed or expressed   Agreement Not to Harm Self Yes  Description of Agreement pt verbally agrees to notify staff immediately for any thoughts of hurting himself or anyone else  Danger to Others  Danger to Others None reported or observed

## 2020-12-12 NOTE — Plan of Care (Signed)
  Problem: Safety: Goal: Ability to disclose and discuss suicidal ideas will improve Outcome: Progressing Goal: Ability to identify and utilize support systems that promote safety will improve Outcome: Progressing   Problem: Self-Concept: Goal: Will verbalize positive feelings about self Outcome: Progressing Goal: Level of anxiety will decrease Outcome: Progressing   

## 2020-12-12 NOTE — Progress Notes (Signed)
Progress note    12/12/20 0738  Psych Admission Type (Psych Patients Only)  Admission Status Voluntary  Psychosocial Assessment  Patient Complaints Anxiety  Eye Contact Fair  Facial Expression Anxious  Affect Anxious  Speech Logical/coherent;Pressured  Interaction Assertive;Attention-seeking  Motor Activity Slow  Appearance/Hygiene In scrubs  Behavior Characteristics Cooperative;Appropriate to situation;Anxious  Mood Anxious;Pleasant  Thought Process  Coherency Disorganized  Content Confabulation  Delusions None reported or observed  Perception Hallucinations  Hallucination Auditory;Visual  Judgment Poor  Confusion None  Danger to Self  Current suicidal ideation? Passive  Self-Injurious Behavior Some self-injurious ideation observed or expressed.  No lethal plan expressed   Agreement Not to Harm Self Yes  Description of Agreement pt verbally agrees to approach staff before harming self while at [facility]  Danger to Others  Danger to Others None reported or observed

## 2020-12-12 NOTE — Progress Notes (Signed)
[facility] MD Progress Note  12/12/2020 11:57 AM Clinton Fisher  MRN:  [id number] Subjective:  Patient is a 29 year old male with a reported past psychiatric history significant for bipolar disorder; most recently manic, severe with psychotic features who originally presented to the behavioral health urgent care center on 12/08/2020.  He was labile, tangential, and the mother was concerned about the patient's safety.  She also stated she did not trust him not to hurt her.  He was admitted to the hospital for evaluation and stabilization.  Objective: Patient is seen and examined.  Patient is a 30 year old male with the above-stated past psychiatric history who is seen in follow-up.  He is essentially unchanged today.  He once again is irritable, then will say 1 thing, and then say another and say that he did not say that.  He stated today he felt as though the medications were making him worse.  His Seroquel was increased yesterday to 300 mg p.o. daily and 600 mg p.o. nightly.  His Tegretol was increased to 300 mg p.o. twice daily.  We discussed his previous medications and he is very nonspecific.  He stated that he had been on "40 medicines", and could not remember any of them.  He stated he had memory problems, and then would go back to describe an episode of something in clear detail that occurred previously.  As stated in his admission H&P he had been previously treated with Geodon orally, Depakote, Abilify, Risperdal, fluoxetine is prone, hydroxyzine, clonidine, Lamictal and Seroquel.  He is unsure whether any of the medications helped him.  Unfortunately his psychiatric visits through the electronic medical record do not contain any discussion of the visit.  Just a list of medications and laboratories.  The one medication that I have noticed has not been mentioned his lithium.  Again he continues to endorse suicidal symptoms, but stated he had been suicidal for a long time.  This would be years.  He is not hurt  himself in the past.  He also threatens generally everyone, but is unable to state specifically if there is anyone that he wants to hurt.  His vital signs are stable, he is afebrile.  Pulse oximetry on room air was 100%.  He only slept 5.5 hours last night.  No new laboratories.  Principal Problem: <principal problem not specified> Diagnosis: Active Problems:   Bipolar disorder with psychotic features (HCC)  Total Time spent with patient: 20 minutes  Past Psychiatric History: See admission H&P  Past Medical History:  Past Medical History:  Diagnosis Date  . Abrasion of index finger 07/29/2013   left  . 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 GERD (gastroesophageal reflux disease)    no current med.  . Manic affective disorder with recurrent episode (HCC)   . Seasonal allergies    sore throat 07/29/2013    Past Surgical History:  Procedure Laterality Date  . FOOT FOREIGN BODY REMOVAL Right 01/19/2011  . KNEE ARTHROSCOPY Right 08/01/2013   Procedure: ARTHROSCOPY RIGHT KNEE, Chondroplasty, Excision of Plica;  Surgeon: Harvie Junior, MD;  Location: [facility];  Service: Orthopedics;  Laterality: Right;   Family History: History reviewed. No pertinent family history. Family Psychiatric  History: See admission H&P Social History:  Social History   Substance and Sexual Activity  Alcohol Use Yes   Comment: every 3 days     Social History   Substance and Sexual Activity  Drug Use No  Social History   Socioeconomic History  . Marital status: Single    Spouse name: Not on file  . Number of children: Not on file  . Years of education: Not on file  . Highest education level: Not on file  Occupational History  . Not on file  Tobacco Use  . Smoking status: Current Every Day Smoker  . Smokeless tobacco: Never Used  Substance and Sexual Activity  . Alcohol use: Yes    Comment: every 3 days  . Drug use: No  . Sexual activity: Not on file  Other Topics  Concern  . Not on file  Social History Narrative  . Not on file   Social Determinants of Health   Financial Resource Strain: Not on file  Food Insecurity: Not on file  Transportation Needs: Not on file  Physical Activity: Not on file  Stress: Not on file  Social Connections: Not on file   Additional Social History:                         Sleep: Fair  Appetite:  Good  Current Medications: Current Facility-Administered Medications  Medication Dose Route Frequency Provider Last Rate Last Admin  . acetaminophen (TYLENOL) tablet 650 mg  650 mg Oral Q6H PRN White, Patrice L, NP      . alum & mag hydroxide-simeth (MAALOX/MYLANTA) 200-200-20 MG/5ML suspension 30 mL  30 mL Oral Q4H PRN White, Patrice L, NP   30 mL at 12/12/20 1012  . carbamazepine (TEGRETOL) chewable tablet 300 mg  300 mg Oral BID Antonieta Pert, MD   300 mg at 12/12/20 7425  . hydrOXYzine (ATARAX/VISTARIL) tablet 25 mg  25 mg Oral TID PRN Liborio Nixon L, NP   25 mg at 12/10/20 0820  . OLANZapine zydis (ZYPREXA) disintegrating tablet 10 mg  10 mg Oral Q8H PRN Antonieta Pert, MD       And  . LORazepam (ATIVAN) tablet 1 mg  1 mg Oral Q6H PRN Antonieta Pert, MD       And  . ziprasidone (GEODON) injection 20 mg  20 mg Intramuscular Q6H PRN Antonieta Pert, MD      . magnesium hydroxide (MILK OF MAGNESIA) suspension 30 mL  30 mL Oral Daily PRN White, Patrice L, NP      . nicotine (NICODERM CQ - dosed in mg/24 hours) patch 21 mg  21 mg Transdermal Daily Antonieta Pert, MD   21 mg at 12/12/20 0740  . QUEtiapine (SEROQUEL) tablet 300 mg  300 mg Oral Daily Antonieta Pert, MD   300 mg at 12/12/20 9563  . QUEtiapine (SEROQUEL) tablet 600 mg  600 mg Oral QHS Antonieta Pert, MD   600 mg at 12/11/20 2056    Lab Results: No results found for this or any previous visit (from the past 48 hour(s)).  Blood Alcohol level:  Lab Results  Component Value Date   ETH <10 12/08/2020    Metabolic  Disorder Labs: Lab Results  Component Value Date   HGBA1C 5.3 12/10/2020   MPG 105.41 12/10/2020   No results found for: PROLACTIN Lab Results  Component Value Date   CHOL 139 12/10/2020   TRIG 93 12/10/2020   HDL 41 12/10/2020   CHOLHDL 3.4 12/10/2020   VLDL 19 12/10/2020   LDLCALC 79 12/10/2020    Physical Findings: AIMS: Facial and Oral Movements Muscles of Facial Expression: None, normal Lips and Perioral Area: None,  normal Jaw: None, normal Tongue: None, normal,Extremity Movements Upper (arms, wrists, hands, fingers): None, normal Lower (legs, knees, ankles, toes): None, normal, Trunk Movements Neck, shoulders, hips: None, normal, Overall Severity Severity of abnormal movements (highest score from questions above): None, normal Incapacitation due to abnormal movements: None, normal Patient's awareness of abnormal movements (rate only patient's report): No Awareness, Dental Status Current problems with teeth and/or dentures?: No Does patient usually wear dentures?: No  CIWA:    COWS:     Musculoskeletal: Strength & Muscle Tone: within normal limits Gait & Station: normal Patient leans: N/A  Psychiatric Specialty Exam:  Presentation  General Appearance: Disheveled  Eye Contact:Fleeting  Speech:Pressured  Speech Volume:Increased  Handedness:Right   Mood and Affect  Mood:Labile; Irritable  Affect:Labile   Thought Process  Thought Processes:Goal Directed  Descriptions of Associations:Tangential  Orientation:Full (Time, Place and Person)  Thought Content:Delusions; Paranoid Ideation; Rumination; Tangential  History of Schizophrenia/Schizoaffective disorder:No  Duration of Psychotic Symptoms:Greater than six months  Hallucinations:Hallucinations: Auditory  Ideas of Reference:Paranoia  Suicidal Thoughts:Suicidal Thoughts: Yes, Active SI Active Intent and/or Plan: Without Intent  Homicidal Thoughts:Homicidal Thoughts: Yes, Passive HI Active  Intent and/or Plan: Without Intent   Sensorium  Memory:Immediate Poor; Recent Poor; Remote Poor  Judgment:Impaired  Insight:Lacking   Executive Functions  Concentration:Fair  Attention Span:Poor  Recall:Poor  Fund of Knowledge:Poor  Language:Poor   Psychomotor Activity  Psychomotor Activity:Psychomotor Activity: Restlessness   Assets  Assets:Desire for Improvement; Resilience   Sleep  Sleep:Sleep: Fair    Physical Exam: Physical Exam Vitals and nursing note reviewed.  Constitutional:      Appearance: Normal appearance.  HENT:     Head: Normocephalic and atraumatic.  Pulmonary:     Effort: Pulmonary effort is normal.  Neurological:     General: No focal deficit present.     Mental Status: He is alert and oriented to person, place, and time.    ROS Blood pressure 128/82, pulse 87, temperature 98.3 F (36.8 C), temperature source Oral, resp. rate 18, height 5\' 8"  (1.727 m), weight 78 kg, SpO2 100 %. Body mass index is 26.15 kg/m.   Treatment Plan Summary: Daily contact with patient to assess and evaluate symptoms and progress in treatment, Medication management and Plan : Patient is seen and examined.  Patient is a 30 year old male with the above-stated past psychiatric history who is seen in follow-up.   Diagnosis: 1.  Bipolar disorder, most recently manic, severe with psychotic features 2.  Cannabis dependence versus cannabis use disorder.  Pertinent findings on examination today: 1.  Essentially unchanged from yesterday. 2.  Continues to voice suicidal and homicidal ideation, but has neither harmed himself or anyone else in the past. 3.  He did not mention anything about auditory hallucinations today. 4.  He remains labile, tangential and at times pressured. 5.  It should be noted that when he is in the day room he is appropriate and controlled.  Plan: 1.  Add lithium carbonate (Lithobid) 300 mg p.o. twice daily for mood stability.. 2.  Continue  Tegretol 300 mg p.o. twice daily for now.  If the lithium is more effective we will wean the Tegretol off. 3.  Patient is agreeable to a long-acting injectable medication.  He has been on several antipsychotics in the past.  I will reduce his Seroquel to 200 mg p.o. daily and 400 mg p.o. nightly for psychosis. 4.  We will start Prolixin 5 mg p.o. twice daily for mood stability and psychosis, and if successful  transition to the long-acting Prolixin injection. 5.  Continue hydroxyzine 25 mg p.o. 3 times daily as needed anxiety. 6.  Blood pressure is stable. 7.  Disposition planning,-in progress.  Antonieta Pert, MD 12/12/2020, 11:57 AM

## 2020-12-12 NOTE — [facility] Group Notes (Signed)
Adult Psychoeducational Group Not Date:  12/12/2020 Time:  6579-0383 Group Topic/Focus: PROGRESSIVE RELAXATION. A group where deep breathing is taught and tensing and relaxation muscle groups is used. Imagery is used as well.  Pts are asked to imagine 3 pillars that hold them up when they are not able to hold themselves up.  Participation Level:  Active  Participation Quality:  Appropriate  Affect:  Appropriate  Cognitive:  Oriented  Insight: Improving  Engagement in Group:  Engaged  Modes of Intervention:  Activity, Discussion, Education, and Support  Additional Comments:  Pt rates his energy at a 5/10. States his pillars are friends, family and pot.  Dione Housekeeper

## 2020-12-12 NOTE — Progress Notes (Signed)
Adult Psychoeducational Group Note  Date:  12/12/2020 Time:  9:13 PM  Group Topic/Focus:  Wrap-Up Group:   The focus of this group is to help patients review their daily goal of treatment and discuss progress on daily workbooks.  Participation Level:  Active  Participation Quality:  Appropriate  Affect:  Appropriate  Cognitive:  Appropriate  Insight: Appropriate  Engagement in Group:  Developing/Improving  Modes of Intervention:  Discussion  Additional Comments: Pt stated his goal for today was to focus on his treatment plan. Pt stated he accomplished his goal today. Pt stated he talked with his doctor but did not get a chance to talk with his social worker about his care today. Pt rated his overall day a 7 out of 10. Pt stated he was able to contact his mother today which improved his overall day. Pt stated he felt better about himself today. Pt stated he attend all groups held today. Pt stated he attend all meals held today. Pt stated he took all medications provided today. Pt stated his appetite was pretty good today. Pt rated sleep last night was pretty good. Pt stated the goal tonight was to get some rest. Pt stated he had some physical pain tonight. Pt stated he had some moderate pain in his right knee. Pt nurse was made aware of the situation. Pt deny visual hallucinations issues tonight. Pt admitted to experiencing auditory issues tonight. Pt nurse was made aware of the situation. Pt deny thoughts of harming himself or others. Pt stated he would alert staff if anything changed   [name] 12/12/2020, 9:13 PM

## 2020-12-13 ENCOUNTER — Inpatient Hospital Stay (HOSPITAL_COMMUNITY): Payer: 59

## 2020-12-13 DIAGNOSIS — F319 Bipolar disorder, unspecified: Secondary | ICD-10-CM | POA: Diagnosis not present

## 2020-12-13 MED ORDER — CARBAMAZEPINE 100 MG PO CHEW
100.0000 mg | CHEWABLE_TABLET | Freq: Two times a day (BID) | ORAL | Status: DC
  Administered 2020-12-13 – 2020-12-16 (×7): 100 mg via ORAL
  Filled 2020-12-13 (×11): qty 1

## 2020-12-13 MED ORDER — QUETIAPINE FUMARATE 200 MG PO TABS
200.0000 mg | ORAL_TABLET | Freq: Every day | ORAL | Status: DC
  Administered 2020-12-13 – 2020-12-16 (×4): 200 mg via ORAL
  Filled 2020-12-13 (×5): qty 1

## 2020-12-13 MED ORDER — FLUPHENAZINE HCL 5 MG PO TABS
10.0000 mg | ORAL_TABLET | Freq: Two times a day (BID) | ORAL | Status: DC
  Administered 2020-12-13 – 2020-12-20 (×14): 10 mg via ORAL
  Filled 2020-12-13: qty 2
  Filled 2020-12-13 (×18): qty 1

## 2020-12-13 MED ORDER — LITHIUM CARBONATE ER 300 MG PO TBCR
300.0000 mg | EXTENDED_RELEASE_TABLET | Freq: Every day | ORAL | Status: DC
  Administered 2020-12-13 – 2020-12-19 (×7): 300 mg via ORAL
  Filled 2020-12-13 (×9): qty 1

## 2020-12-13 NOTE — Progress Notes (Signed)
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)  Coping Skills:   Isolation,Sports,Arguments,Music,Exercise,Deep Copy  Leisure Interests (2+):  Games - Video games,Individual - TV,Individual - Other (Comment) (Learn something)  Frequency of Recreation/Participation: Other (Comment) ("when not doing normal life stuff")  Awareness of Community Resources:  No  Expressed Interest in [employer] Information: No  County of Residence:  [place]  Patient Main Form of Transportation: Set designer  Patient Strengths:  Strong; Persistant  Patient Identified Areas of Improvement:  Keeping relationships with people  Patient Goal for Hospitalization:  "to get a regin on emotions that I can't control"  Current SI (including self-harm):  No  Current HI:  No  Current AVH: Yes (Hearing his name or one word at a time)  Staff Intervention Plan: Group Attendance,Collaborate with Interdisciplinary Treatment Team  Consent to Intern Participation: N/A    Caroll Rancher, LRT/CTRS  Lillia Abed, Julieanne Hadsall A 12/13/2020, 1:45 PM

## 2020-12-13 NOTE — Progress Notes (Incomplete)
[facility] MD Progress Note  12/13/2020 1:13 PM Clinton Fisher  MRN:  [id number]   Chief Complaint: mania and psychosis  Subjective:  Clinton Fisher is a 30 y.o. male with a reported past psychiatric history significant for bipolar disorder, who originally presented to the behavioral health urgent care center on 12/08/2020 with labile mood, SI, HI, hyperverbal speech, and AH. The patient is currently on Hospital Day 4.   Chart Review from last 24 hours:  The patient's chart was reviewed and nursing notes were reviewed. The patient's case was discussed in multidisciplinary team meeting. Per [facility]   Information Obtained Today During Patient Interview: The patient was seen and evaluated on the unit. On assessment today the patient reports ***  Principal Problem: <principal problem not specified> Diagnosis: Active Problems:   Bipolar disorder with psychotic features (HCC)  Total Time Spent in Direct Patient Care:  I personally spent 30 minutes on the unit in direct patient care. The direct patient care time included face-to-face time with the patient, reviewing the patient's chart, communicating with other professionals, and coordinating care. Greater than 50% of this time was spent in counseling or coordinating care with the patient regarding goals of hospitalization, psycho-education, and discharge planning needs.  Past Psychiatric History: see admission H&P  Past Medical History:  Past Medical History:  Diagnosis Date  . Abrasion of index finger 07/29/2013   left  . 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 GERD (gastroesophageal reflux disease)    no current med.  . Manic affective disorder with recurrent episode (HCC)   . Seasonal allergies    sore throat 07/29/2013    Past Surgical History:  Procedure Laterality Date  . FOOT FOREIGN BODY REMOVAL Right 01/19/2011  . KNEE ARTHROSCOPY Right 08/01/2013   Procedure: ARTHROSCOPY RIGHT KNEE, Chondroplasty, Excision of Plica;  Surgeon: Harvie Junior, MD;  Location: [facility];  Service: Orthopedics;  Laterality: Right;   Family History: see admission H&P  Family Psychiatric  History: see admission H&P  Social History:  Social History   Substance and Sexual Activity  Alcohol Use Yes   Comment: every 3 days     Social History   Substance and Sexual Activity  Drug Use No    Social History   Socioeconomic History  . Marital status: Single    Spouse name: Not on file  . Number of children: Not on file  . Years of education: Not on file  . Highest education level: Not on file  Occupational History  . Not on file  Tobacco Use  . Smoking status: Current Every Day Smoker  . Smokeless tobacco: Never Used  Substance and Sexual Activity  . Alcohol use: Yes    Comment: every 3 days  . Drug use: No  . Sexual activity: Not on file  Other Topics Concern  . Not on file  Social History Narrative  . Not on file   Social Determinants of Health   Financial Resource Strain: Not on file  Food Insecurity: Not on file  Transportation Needs: Not on file  Physical Activity: Not on file  Stress: Not on file  Social Connections: Not on file   Sleep: {BHH GOOD/FAIR/POOR:22877}  Appetite:  {BHH GOOD/FAIR/POOR:22877}  Current Medications: Current Facility-Administered Medications  Medication Dose Route Frequency Provider Last Rate Last Admin  . acetaminophen (TYLENOL) tablet 650 mg  650 mg Oral Q6H PRN White, Patrice L, NP   650 mg at 12/12/20 2054  .  alum & mag hydroxide-simeth (MAALOX/MYLANTA) 200-200-20 MG/5ML suspension 30 mL  30 mL Oral Q4H PRN White, Patrice L, NP   30 mL at 12/12/20 1012  . carbamazepine (TEGRETOL) chewable tablet 100 mg  100 mg Oral BID Antonieta Pert, MD      . fluPHENAZine (PROLIXIN) tablet 10 mg  10 mg Oral BID Antonieta Pert, MD      . hydrOXYzine (ATARAX/VISTARIL) tablet 25 mg  25 mg Oral TID PRN White, Patrice L, NP   25 mg at 12/10/20 0820  . lithium carbonate  (LITHOBID) CR tablet 300 mg  300 mg Oral QHS Antonieta Pert, MD      . OLANZapine zydis [facility]) disintegrating tablet 10 mg  10 mg Oral Q8H PRN Antonieta Pert, MD       And  . LORazepam (ATIVAN) tablet 1 mg  1 mg Oral Q6H PRN Antonieta Pert, MD       And  . ziprasidone (GEODON) injection 20 mg  20 mg Intramuscular Q6H PRN Antonieta Pert, MD      . magnesium hydroxide (MILK OF MAGNESIA) suspension 30 mL  30 mL Oral Daily PRN White, Patrice L, NP      . nicotine (NICODERM CQ - dosed in mg/24 hours) patch 21 mg  21 mg Transdermal Daily Antonieta Pert, MD   21 mg at 12/13/20 0805  . QUEtiapine (SEROQUEL) tablet 200 mg  200 mg Oral QHS Antonieta Pert, MD        Lab Results: No results found for this or any previous visit (from the past 48 hour(s)).  Blood Alcohol level:  Lab Results  Component Value Date   ETH <10 12/08/2020   Metabolic Disorder Labs: Lab Results  Component Value Date   HGBA1C 5.3 12/10/2020   MPG 105.41 12/10/2020   No results found for: PROLACTIN Lab Results  Component Value Date   CHOL 139 12/10/2020   TRIG 93 12/10/2020   HDL 41 12/10/2020   CHOLHDL 3.4 12/10/2020   VLDL 19 12/10/2020   LDLCALC 79 12/10/2020    Physical Findings: AIMS: Facial and Oral Movements Muscles of Facial Expression: None, normal Lips and Perioral Area: None, normal Jaw: None, normal Tongue: None, normal,Extremity Movements Upper (arms, wrists, hands, fingers): None, normal Lower (legs, knees, ankles, toes): None, normal, Trunk Movements Neck, shoulders, hips: None, normal, Overall Severity Severity of abnormal movements (highest score from questions above): None, normal Incapacitation due to abnormal movements: None, normal Patient's awareness of abnormal movements (rate only patient's report): No Awareness, Dental Status Current problems with teeth and/or dentures?: No Does patient usually wear dentures?: No      Musculoskeletal: Strength & Muscle  Tone: within normal limits Gait & Station: normal, steady Patient leans: N/A  Psychiatric Specialty Exam: Physical Exam  Review of Systems  Blood pressure 115/71, pulse 81, temperature 98.3 F (36.8 C), temperature source Oral, resp. rate 18, height 5\' 8"  (1.727 m), weight 78 kg, SpO2 98 %.Body mass index is 26.15 kg/m.  General Appearance: {Appearance:22683}  Eye Contact:  {BHH EYE CONTACT:22684}  Speech:  {Speech:22685}  Volume:  {Volume (PAA):22686}  Mood:  {BHH MOOD:22306}  Affect:  {Affect (PAA):22687}  Thought Process:  {Thought Process (PAA):22688}  Orientation:  {BHH ORIENTATION (PAA):22689}  Thought Content:  {Thought Content:22690}  Suicidal Thoughts:  {ST/HT (PAA):22692}  Homicidal Thoughts:  {ST/HT (PAA):22692}  Memory:  {BHH  Judgement:  {Judgement (PAA):22694}  Insight:  {Insight (PAA):22695}  Psychomotor Activity:  {Psychomotor (  PAA):22696}  Concentration:  {Concentration:21399}  Recall:  {BHH GOOD/FAIR/POOR:22877}  Fund of Knowledge:  {BHH GOOD/FAIR/POOR:22877}  Language:  {BHH GOOD/FAIR/POOR:22877}  Akathisia:  {BHH YES OR NO:22294}  Handed:  {Handed:22697}  AIMS (if indicated):     Assets:  {Assets (PAA):22698}  ADL's:  {BHH UUV'O:53664}  Cognition:  {chl bhh cognition:304700322}  Sleep:  Number of Hours: 6.75   Treatment Plan Summary: Diagnoses / Active Problems: Bipolar I d/o MRE manic with psychotic features Cannabis use d/o  PLAN: 1. Safety and Monitoring:  -- Voluntary admission to inpatient psychiatric unit for safety, stabilization and treatment  -- Daily contact with patient to assess and evaluate symptoms and progress in treatment  -- Patient's case to be discussed in multi-disciplinary team meeting  -- Observation Level : q15 minute checks  -- Vital signs:  q12 hours  -- Precautions: suicide, elopement, and assault  2. Psychiatric Diagnoses and Treatment:  -- Discontinue Tegretol  -- Continue Prolixin 10mg  bid for  psychosis -- Weaning off Seroquel - reduce to 100mg  qhs tonight then stop -- Increase Lithobid 300mg  bid for mood stability (TSH 0.790, BUN 14, creatinine 0.81, Na+ 139)  -- Metabolic profile and EKG monitoring obtained while on an atypical antipsychotic (BMI: Lipid Panel:cholesterol 139, triglycerides 93, HDL 41, LDL 79; HbgA1c:5.3; QTc:495ms)   -- Encouraged patient to participate in unit milieu and in scheduled group therapies   -- Short Term Goals: Ability to identify changes in lifestyle to reduce recurrence of condition will improve, Ability to demonstrate self-control will improve and Ability to identify and develop effective coping behaviors will improve  -- Long Term Goals: Improvement in symptoms so as ready for discharge   Cannabis use d/o  -- UDS positive for [facility] and patient counseled on the need to abstain from illicit drug use  -- Short Term Goals: Ability to identify triggers associated with substance abuse/mental health issues will improve  -- Long Term Goals: Improvement in symptoms so as ready for discharge   3. Medical Issues Being Addressed:   Tobacco Use Disorder  -- Nicotine patch 21mg /24 hours ordered  -- Smoking cessation encouraged  4. Discharge Planning:   -- Social work and case management to assist with discharge planning and identification of hospital follow-up needs prior to discharge  -- Estimated LOS: 5-7 days  -- Discharge Concerns: Need to establish a safety plan; Medication compliance and effectiveness  -- Discharge Goals: Return home with outpatient referrals for mental health follow-up including medication management/psychotherapy  100m, MD, FAPA 12/13/2020, 1:13 PM

## 2020-12-13 NOTE — Progress Notes (Signed)
Adult Psychoeducational Group Note  Date:  12/13/2020 Time:  8:27 PM  Group Topic/Focus:  Wrap-Up Group:   The focus of this group is to help patients review their daily goal of treatment and discuss progress on daily workbooks.  Participation Level:  Active  Participation Quality:  Appropriate  Affect:  Appropriate  Cognitive:  Appropriate  Insight: Appropriate  Engagement in Group:  Developing/Improving  Modes of Intervention:  Discussion  Additional Comments:  Pt stated his goal for today was to focus on his treatment plan. Pt stated he accomplished his goal today. Pt stated he talked with his doctor but did not get a chance to talk with his social worker about his care today. Pt rated his overall day a 10. Pt stated he was able to contact his mother today which improved his overall day. Pt stated he felt better about himself today. Pt stated he attend all groups held today. Pt stated he attend all meals held today. Pt stated he took all medications provided today. Pt stated his appetite was pretty good today. Pt rated sleep last night was pretty good. Pt stated the goal tonight was to get some rest. Pt stated he had some physical pain tonight. Pt stated he had some moderate pain in his right knee. Pt rated the moderate pain in his right knee a 6 on the pain level scale. Pt nurse was made aware of the situation. Pt deny visual hallucinations issues tonight. Pt admitted to experiencing auditory issues tonight. Pt nurse was made aware of the situation. Pt deny thoughts of harming himself or others. Pt stated he would alert staff if anything changed   [name] 12/13/2020, 8:27 PM

## 2020-12-13 NOTE — [facility] Group Notes (Signed)
[facility] LCSW Group Therapy  5/23/20221:23 PM  Type of Therapy:Group Therapy: Value Clarification   Participation Level:Did not attend  Description of Group: In this group, patients will learn to explore,identify,and clarify the values that influence their decisions and behavior and encourages them to build on their inner resources and strengths. This group will beclarifying, exporationaland eductional, with patients participating in identifying their own values, identifying others values, and identifying societal values and their impact on their lives.   Therapeutic Goals: 1. Patient will learn toidentify values. 2. Patient will learn toidentify others values. 3. Patient will learnways to use their values in decisions they make in their daily lives. 4. Patient will receive support and feedback from others   Therapeutic Modalities: Acceptance and Commitment Therapy   Summary of Progress/Problems: Did not attend.  Ruthann Cancer MSW, LCSW Clincal Social Worker  [facility]

## 2020-12-13 NOTE — Progress Notes (Signed)
DAR NOTE: Patient presents with anxious affect and mood.  Denies suicidal thoughts but reports auditory and visual hallucinations.  Rates depression at 0, hopelessness at 0, and anxiety at 5.  Maintained on routine safety checks.  Medications given as prescribed.  Support and encouragement offered as needed.  Attended group and participated.  States goal for today is "getting the injection so I can go home."  Patient observed socializing with peers in the dayroom.  Patient is safe on and off the unit.  Offered no complaint.

## 2020-12-13 NOTE — Progress Notes (Signed)
Recreation Therapy Notes  Date: 5.23.22 Time: 1000 Location: 500 Hall Dayroom  Group Topic: Coping Skills  Goal Area(s) Addresses:  Patient will define what a coping skill is. Patient will identify positive and negative coping skills. Patient will identify coping skills that can be used post d/c.  Behavioral Response: Engaged  Intervention: Worksheet  Activity: Healthy vs. Unhealthy Coping Strategies.  Patients were to identify a current problem they are dealing with.  Patients then had to identify unhealthy coping strategies they have used and the consequences of those coping strategies.  Patients also had to identify healthy coping strategies, the expected outcome and barriers to using these coping strategies.  Education: Pharmacologist, Building control surveyor.   Education Outcome: Acknowledges understanding/In group clarification offered/Needs additional education.   Clinical Observations/Feedback: Pt identified current problem as anger, rage and willingness to hurt anyone that has hurt him.  Pt has used drugs, liquor and violence to deal with these unhealthy coping strategies.  Pt expressed the consequences of these actions were "being torn and ravaged mentally".  The healthy coping skills patient identified were talk out emotions, punch a punching bag and fireworks, play guitar or go to a gun range.  Expected outcomes were identified as being calm/collected, tired out enough to be calm and "not have the urge to hurt other people or destroy things".  Barriers to these were not being perfect, the person making you mad and neighbors won't be happy about the loud noises.  Pt explained during processing, he had a hard trusting the people around here because he felt they were just shoving pills on him and he had no choice but to take them.  Pt went on to say staff took the time to explain what he was taking and the purpose for it and he began trusting staff more.  Pt also stated he trusts the doctor  more than any doctor he's had in the past 10 years because "he's being straight with me".     Caroll Rancher, LRT/CTRS    Caroll Rancher A 12/13/2020 12:18 PM

## 2020-12-13 NOTE — Progress Notes (Signed)
[facility] MD Progress Note  12/13/2020 10:50 AM Clinton Fisher  MRN:  [id number] Subjective:  Patient is a 30 year old male with a reported past psychiatric history significant for bipolar disorder; most recently manic, severe with psychotic features who originally presented to the behavioral health urgent care center on 12/08/2020. He was labile, tangential, and the mother was concerned about the patient's safety. She also stated she did not trust him not to hurt her. He was admitted to the hospital for evaluation and stabilization.  Objective: Patient is seen and examined.  Patient is a 30 year old male with the above-stated past psychiatric history who is seen in follow-up.  He is actually better today.  His irritability has decreased significantly.  He actually stated he felt better.  He denied any suicidal, homicidal or psychotic symptoms.  He stated he slept well.  He stated he is able to control his anger today.  He was started on Prolixin as well as lithium yesterday, and we discussed the fact that we were going to reduce some of the other medications.  The hope is if he tolerates the Prolixin to be giving the long-acting Prolixin injection.  Yesterday when he was still very agitated he was talking to his mother on the phone, and stated he would not take any pills.  This morning he stated he would because he is feeling better with these medications.  His vital signs are stable, he is afebrile.  Pulse oximetry on room air is 98%.  He slept 6.75 hours last night.  No new lab work.  Principal Problem: <principal problem not specified> Diagnosis: Active Problems:   Bipolar disorder with psychotic features (HCC)  Total Time spent with patient: 20 minutes  Past Psychiatric History: See admission H&P  Past Medical History:  Past Medical History:  Diagnosis Date  . Abrasion of index finger 07/29/2013   left  . 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 GERD (gastroesophageal reflux disease)     no current med.  . Manic affective disorder with recurrent episode (HCC)   . Seasonal allergies    sore throat 07/29/2013    Past Surgical History:  Procedure Laterality Date  . FOOT FOREIGN BODY REMOVAL Right 01/19/2011  . KNEE ARTHROSCOPY Right 08/01/2013   Procedure: ARTHROSCOPY RIGHT KNEE, Chondroplasty, Excision of Plica;  Surgeon: Harvie Junior, MD;  Location: [facility];  Service: Orthopedics;  Laterality: Right;   Family History: History reviewed. No pertinent family history. Family Psychiatric  History: See admission H&P Social History:  Social History   Substance and Sexual Activity  Alcohol Use Yes   Comment: every 3 days     Social History   Substance and Sexual Activity  Drug Use No    Social History   Socioeconomic History  . Marital status: Single    Spouse name: Not on file  . Number of children: Not on file  . Years of education: Not on file  . Highest education level: Not on file  Occupational History  . Not on file  Tobacco Use  . Smoking status: Current Every Day Smoker  . Smokeless tobacco: Never Used  Substance and Sexual Activity  . Alcohol use: Yes    Comment: every 3 days  . Drug use: No  . Sexual activity: Not on file  Other Topics Concern  . Not on file  Social History Narrative  . Not on file   Social Determinants of Health   Financial Resource Strain: Not  on file  Food Insecurity: Not on file  Transportation Needs: Not on file  Physical Activity: Not on file  Stress: Not on file  Social Connections: Not on file   Additional Social History:                         Sleep: Good  Appetite:  Good  Current Medications: Current Facility-Administered Medications  Medication Dose Route Frequency Provider Last Rate Last Admin  . acetaminophen (TYLENOL) tablet 650 mg  650 mg Oral Q6H PRN White, Patrice L, NP   650 mg at 12/12/20 2054  . alum & mag hydroxide-simeth (MAALOX/MYLANTA) 200-200-20 MG/5ML suspension  30 mL  30 mL Oral Q4H PRN White, Patrice L, NP   30 mL at 12/12/20 1012  . carbamazepine (TEGRETOL) chewable tablet 100 mg  100 mg Oral BID Antonieta Pert, MD      . fluPHENAZine (PROLIXIN) tablet 10 mg  10 mg Oral BID Antonieta Pert, MD      . hydrOXYzine (ATARAX/VISTARIL) tablet 25 mg  25 mg Oral TID PRN White, Patrice L, NP   25 mg at 12/10/20 0820  . OLANZapine zydis (ZYPREXA) disintegrating tablet 10 mg  10 mg Oral Q8H PRN Antonieta Pert, MD       And  . LORazepam (ATIVAN) tablet 1 mg  1 mg Oral Q6H PRN Antonieta Pert, MD       And  . ziprasidone (GEODON) injection 20 mg  20 mg Intramuscular Q6H PRN Antonieta Pert, MD      . magnesium hydroxide (MILK OF MAGNESIA) suspension 30 mL  30 mL Oral Daily PRN White, Patrice L, NP      . nicotine (NICODERM CQ - dosed in mg/24 hours) patch 21 mg  21 mg Transdermal Daily Antonieta Pert, MD   21 mg at 12/13/20 0805  . QUEtiapine (SEROQUEL) tablet 200 mg  200 mg Oral QHS Antonieta Pert, MD        Lab Results: No results found for this or any previous visit (from the past 48 hour(s)).  Blood Alcohol level:  Lab Results  Component Value Date   ETH <10 12/08/2020    Metabolic Disorder Labs: Lab Results  Component Value Date   HGBA1C 5.3 12/10/2020   MPG 105.41 12/10/2020   No results found for: PROLACTIN Lab Results  Component Value Date   CHOL 139 12/10/2020   TRIG 93 12/10/2020   HDL 41 12/10/2020   CHOLHDL 3.4 12/10/2020   VLDL 19 12/10/2020   LDLCALC 79 12/10/2020    Physical Findings: AIMS: Facial and Oral Movements Muscles of Facial Expression: None, normal Lips and Perioral Area: None, normal Jaw: None, normal Tongue: None, normal,Extremity Movements Upper (arms, wrists, hands, fingers): None, normal Lower (legs, knees, ankles, toes): None, normal, Trunk Movements Neck, shoulders, hips: None, normal, Overall Severity Severity of abnormal movements (highest score from questions above): None,  normal Incapacitation due to abnormal movements: None, normal Patient's awareness of abnormal movements (rate only patient's report): No Awareness, Dental Status Current problems with teeth and/or dentures?: No Does patient usually wear dentures?: No  CIWA:    COWS:     Musculoskeletal: Strength & Muscle Tone: within normal limits Gait & Station: normal Patient leans: N/A  Psychiatric Specialty Exam:  Presentation  General Appearance: Fairly Groomed  Eye Contact:Fair  Speech:Pressured  Speech Volume:Increased  Handedness:Right   Mood and Affect  Mood:Anxious; Irritable; Labile  Affect:Congruent  Thought Process  Thought Processes:Goal Directed  Descriptions of Associations:Circumstantial  Orientation:Full (Time, Place and Person)  Thought Content:Tangential  History of Schizophrenia/Schizoaffective disorder:No  Duration of Psychotic Symptoms:Greater than six months  Hallucinations:Hallucinations: None  Ideas of Reference:Delusions; Paranoia  Suicidal Thoughts:Suicidal Thoughts: Yes, Passive SI Active Intent and/or Plan: Without Intent  Homicidal Thoughts:Homicidal Thoughts: Yes, Passive HI Active Intent and/or Plan: Without Intent   Sensorium  Memory:Immediate Poor; Recent Poor; Remote Poor  Judgment:Impaired  Insight:Lacking   Executive Functions  Concentration:Fair  Attention Span:Fair  Recall:Fair  Fund of Knowledge:Fair  Language:Fair   Psychomotor Activity  Psychomotor Activity:Psychomotor Activity: Increased   Assets  Assets:Desire for Improvement; Housing; Resilience   Sleep  Sleep:Sleep: Fair Number of Hours of Sleep: 5.5    Physical Exam: Physical Exam Vitals and nursing note reviewed.  Constitutional:      Appearance: Normal appearance.  HENT:     Head: Normocephalic and atraumatic.  Pulmonary:     Effort: Pulmonary effort is normal.  Neurological:     General: No focal deficit present.     Mental Status:  He is alert and oriented to person, place, and time.    ROS Blood pressure 115/71, pulse 81, temperature 98.3 F (36.8 C), temperature source Oral, resp. rate 18, height 5\' 8"  (1.727 m), weight 78 kg, SpO2 98 %. Body mass index is 26.15 kg/m.   Treatment Plan Summary: Daily contact with patient to assess and evaluate symptoms and progress in treatment, Medication management and Plan : Patient is seen and examined.  Patient is a 30 year old male with the above-stated past psychiatric history who is seen in follow-up.   Diagnosis: 1.  Bipolar disorder, most recently manic, severe with psychotic features 2.  History of polysubstance use disorders. 3.  Cannabis use disorder versus dependence.  Pertinent findings on examination today: 1.  Irritability has dropped significantly. 2.  He denies auditory or visual hallucinations. 3.  Has been compliant with medications.  Plan: 1.  Decrease Tegretol to 100 mg p.o. twice daily to wean off of this.  This is for mood stability. 2.  Increase Prolixin to 10 mg p.o. twice daily for psychosis. 3.  Continue hydroxyzine 25 mg p.o. 3 times daily as needed anxiety. 4.  Decrease Seroquel to 200 mg p.o. nightly.  We will continue to wean this.  This is for psychosis and mood stability. 5.  Lithium carbonate (Lithobid) 300 mg p.o. nightly for mood stability. 6.  Disposition planning-in progress.  38, MD 12/13/2020, 10:50 AM

## 2020-12-14 ENCOUNTER — Encounter (HOSPITAL_COMMUNITY): Payer: Self-pay | Admitting: Psychiatry

## 2020-12-14 DIAGNOSIS — F312 Bipolar disorder, current episode manic severe with psychotic features: Secondary | ICD-10-CM

## 2020-12-14 DIAGNOSIS — F3113 Bipolar disorder, current episode manic without psychotic features, severe: Secondary | ICD-10-CM

## 2020-12-14 HISTORY — DX: Bipolar disorder, current episode manic without psychotic features, severe: F31.13

## 2020-12-14 HISTORY — DX: Bipolar disorder, current episode manic severe with psychotic features: F31.2

## 2020-12-14 NOTE — Progress Notes (Signed)
   12/14/20 1000  Psych Admission Type (Psych Patients Only)  Admission Status Voluntary  Psychosocial Assessment  Patient Complaints Anxiety  Eye Contact Fair  Facial Expression Anxious  Affect Anxious;Appropriate to circumstance  Speech Logical/coherent;Pressured;Tangential  Interaction Assertive  Motor Activity Slow;Fidgety  Appearance/Hygiene Unremarkable  Behavior Characteristics Cooperative  Mood Anxious  Thought Process  Coherency WDL  Content WDL  Delusions None reported or observed  Perception Hallucinations  Hallucination Auditory;Visual  Judgment Poor  Confusion None  Danger to Self  Current suicidal ideation? Denies  Danger to Others  Danger to Others None reported or observed

## 2020-12-14 NOTE — Progress Notes (Signed)
Pt did not attend in relaxation group.

## 2020-12-14 NOTE — Progress Notes (Signed)
Adult Psychoeducational Group Note  Date:  12/14/2020 Time:  9:04 PM  Group Topic/Focus:  Wrap-Up Group:   The focus of this group is to help patients review their daily goal of treatment and discuss progress on daily workbooks.  Participation Level:  Did Not Attend  Participation Quality:  Did Not Attend  Affect:  Did Not Attend  Cognitive:  Did Not Attend  Insight: None  Engagement in Group:  Did Not Attend  Modes of Intervention:  Did Not Attend  Additional Comments:  Pt did not attend evening wrap up group tonight.  [name] 12/14/2020, 9:04 PM

## 2020-12-14 NOTE — Progress Notes (Addendum)
   12/13/20 2030  Psych Admission Type (Psych Patients Only)  Admission Status Voluntary  Psychosocial Assessment  Patient Complaints Depression  Eye Contact Fair  Facial Expression Anxious  Affect Anxious;Appropriate to circumstance  Speech Logical/coherent;Pressured;Tangential  Interaction Assertive  Motor Activity Slow;Fidgety  Appearance/Hygiene Unremarkable  Behavior Characteristics Cooperative;Anxious  Mood Anxious  Thought Process  Coherency WDL  Content WDL  Delusions None reported or observed  Perception Hallucinations  Hallucination Auditory;Visual  Judgment Poor  Confusion None  Danger to Self  Current suicidal ideation? Denies  Danger to Others  Danger to Others None reported or observed   Pt denies SI, HI. Pt endorses AVH and pain 6/10. Pt says medications are helping with the hallucinations. Pt says he feels better than on admission. "This place has helped me. Those places in [place] and [place] are terrible. I'm gonna tell everyone to come here if they need help." Pt says he is ready for discharge and will be living with his mother. Doesn't seem too happy about that. "It'll be alright for now. I've talked to her on the phone." Pt says he was anxious earlier today when there was a fight between too other patients. "I'm okay now. I've been talking to the others and trying to help them by telling them my story." Pt commended for his willingness to assist in recovery of others.

## 2020-12-14 NOTE — [facility] Group Notes (Signed)
[facility] Group Notes:  (Nursing/MHT/Case Management/Adjunct)  Date:  12/14/2020  Time:  2:18 PM  Type of Therapy:  Group Therapy  Participation Level:  Active  Participation Quality:  Appropriate  Affect:  Appropriate  Cognitive:  Appropriate  Insight:  Appropriate and Good  Engagement in Group:  Engaged  Modes of Intervention:  Orientation  Summary of Progress/Problems: His goal for today is to be able to work towards going home so that he can get a job.   Merlin Ege J Caelin Rosen 12/14/2020, 2:18 PM

## 2020-12-14 NOTE — Progress Notes (Addendum)
Recreation Therapy Notes  Date: 5.24.22 Time: 1000 Location: 500 Hall Dayroom  Group Topic: Communication  Goal Area(s) Addresses:  Patient will effectively communicate with peers in group.  Patient will verbalize benefit of healthy communication. Patient will verbalize positive effect of healthy communication on post d/c goals.  Patient will identify communication techniques that made activity effective for group.   Behavioral Response: Engaged  Intervention: Drawings, Blank paper, Pencils   Activity: Blind Drawings.  Three different volunteers were chosen to describe three different pictures to the rest of the group.  Each volunteer was to describe their picture with as much detail as necessary.  The remaining group members could only as the presenters to repeat themselves, they could not ask any detailed questions.  Education: Communication, Discharge Planning  Education Outcome: Acknowledges understanding/In group clarification offered/Needs additional education.   Clinical Observations/Feedback: Pt explained communication is used to let others know how you feel.  Pt also identified body language and e-mails as ways to communicate.  Pt went on to say background noise can hinder communication with others and sometimes not wanting to talk but wanting to use fists can be a problem as well.  During activity, pt gave clear directions.  Peers were pleased with the way pt described the picture.  Pt felt he could have done better by giving more orientation to where to objects were located.  During processing, pt expressed it was hindering not to get feedback from peers during the activity.  Pt also stated the other person reading your lips while you're talking can help them understand what you say.  Pt left to meet with doctor and did not return.     Caroll Rancher, LRT/CTRS     Caroll Rancher A 12/14/2020 12:43 PM

## 2020-12-14 NOTE — Progress Notes (Signed)
[facility] MD Progress Note  12/14/2020 1:14 PM Clinton Fisher  MRN:  [id number]   Reason for admission:  Clinton Fisher is a 30 year old male with a reported past psychiatric history significant for bipolar disorder; most recently manic, severe with psychotic features who originally presented to the behavioral health urgent care center on 12/08/2020. He was labile, tangential, and the mother was concerned about the patient's safety. She also stated she did not trust him not to hurt her. He was admitted to the hospital for evaluation and stabilization.  Objective: Medical record reviewed.  Patient's case discussed in detail with members of the treatment team.  I met with and evaluated patient today on the unit of for follow-up.  The patient reports that he feels much better today.  He states that the last 2 to 3 days since his medication was changed have been much better for him.  He feels the medication change has helped with his anger and has helped him sleep better.  Patient reports that in the past if he felt someone had wronged him he wanted to wrong them back.  He denies thoughts like this today.  He denies any thoughts of harming others or thoughts of harming himself in the last 3 to 4 days.  He reports good appetite.  He denies hallucinations or paranoid ideation.  He states that his energy is normal.  The patient reports some increased thirst secondary to lithium but denies other side effects to his medications.  Clinton Fisher is appropriately engaged in our conversation.  Good eye contact and is cooperative and polite.  Speech is of normal rate.  Thought processes are coherent and goal-directed.  He reports euthymic mood and affect is stable.  He appears much improved from admission although he seems to be minimizing ongoing symptoms a bit.  The patient slept 6 hours last night.  No new labs.  Vital signs today include BP of 115/64 sitting and 123/83 standing, pulse of 62 sitting and 77 standing, respirations of  18, O2 sat of 99% and temperature of 98.  The patient has been attending groups intermittently and participating appropriately.  He did present as anxious and reported hallucinations to staff yesterday stated that medications were helping hallucinations.  He has been taking standing dose medications as prescribed.  Principal Problem: Bipolar I disorder, current or most recent episode manic, with psychotic features ([place]) Diagnosis: Principal Problem:   Bipolar I disorder, current or most recent episode manic, with psychotic features ([place])  Total Time spent with patient: 20 minutes  Past Psychiatric History: See admission H&P  Past Medical History:  Past Medical History:  Diagnosis Date  . Abrasion of index finger 07/29/2013   left  . Bipolar I disorder, current or most recent episode manic, severe ([place]) 12/14/2020  . Bipolar I disorder, current or most recent episode manic, with psychotic features ([place]) 12/14/2020  . 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 GERD (gastroesophageal reflux disease)    no current med.  . Manic affective disorder with recurrent episode ([place])   . Seasonal allergies    sore throat 07/29/2013    Past Surgical History:  Procedure Laterality Date  . FOOT FOREIGN BODY REMOVAL Right 01/19/2011  . KNEE ARTHROSCOPY Right 08/01/2013   Procedure: ARTHROSCOPY RIGHT KNEE, Chondroplasty, Excision of [place];  Surgeon: Alta Corning, MD;  Location: [facility];  Service: Orthopedics;  Laterality: Right;   Family History: History reviewed. No pertinent family history. Family Psychiatric  History: See admission H&P Social History:  Social History   Substance and Sexual Activity  Alcohol Use Yes   Comment: every 3 days     Social History   Substance and Sexual Activity  Drug Use No    Social History   Socioeconomic History  . Marital status: Single    Spouse name: Not on file  . Number of children: Not on file  . Years of education: Not on file   . Highest education level: Not on file  Occupational History  . Not on file  Tobacco Use  . Smoking status: Current Every Day Smoker  . Smokeless tobacco: Never Used  Substance and Sexual Activity  . Alcohol use: Yes    Comment: every 3 days  . Drug use: No  . Sexual activity: Not on file  Other Topics Concern  . Not on file  Social History Narrative  . Not on file   Social Determinants of Health   Financial Resource Strain: Not on file  Food Insecurity: Not on file  Transportation Needs: Not on file  Physical Activity: Not on file  Stress: Not on file  Social Connections: Not on file   Additional Social History:                         Sleep: Good  Appetite:  Good  Current Medications: Current Facility-Administered Medications  Medication Dose Route Frequency Provider Last Rate Last Admin  . acetaminophen (TYLENOL) tablet 650 mg  650 mg Oral Q6H PRN White, Patrice L, NP   650 mg at 12/13/20 2048  . alum & mag hydroxide-simeth (MAALOX/MYLANTA) 200-200-20 MG/5ML suspension 30 mL  30 mL Oral Q4H PRN White, Patrice L, NP   30 mL at 12/13/20 1809  . carbamazepine (TEGRETOL) chewable tablet 100 mg  100 mg Oral BID Sharma Covert, MD   100 mg at 12/14/20 0801  . fluPHENAZine (PROLIXIN) tablet 10 mg  10 mg Oral BID Sharma Covert, MD   10 mg at 12/14/20 0801  . hydrOXYzine (ATARAX/VISTARIL) tablet 25 mg  25 mg Oral TID PRN White, Patrice L, NP   25 mg at 12/10/20 0820  . lithium carbonate (LITHOBID) CR tablet 300 mg  300 mg Oral QHS Sharma Covert, MD   300 mg at 12/13/20 2048  . OLANZapine zydis (ZYPREXA) disintegrating tablet 10 mg  10 mg Oral Q8H PRN Sharma Covert, MD       And  . LORazepam (ATIVAN) tablet 1 mg  1 mg Oral Q6H PRN Sharma Covert, MD       And  . ziprasidone (GEODON) injection 20 mg  20 mg Intramuscular Q6H PRN Sharma Covert, MD      . magnesium hydroxide (MILK OF MAGNESIA) suspension 30 mL  30 mL Oral Daily PRN White,  Patrice L, NP      . nicotine (NICODERM CQ - dosed in mg/24 hours) patch 21 mg  21 mg Transdermal Daily Sharma Covert, MD   21 mg at 12/14/20 0801  . QUEtiapine (SEROQUEL) tablet 200 mg  200 mg Oral QHS Sharma Covert, MD   200 mg at 12/13/20 2048    Lab Results: No results found for this or any previous visit (from the past 19 hour(s)).  Blood Alcohol level:  Lab Results  Component Value Date   ETH <10 63/84/6659    Metabolic Disorder Labs: Lab Results  Component Value Date  HGBA1C 5.3 12/10/2020   MPG 105.41 12/10/2020   No results found for: PROLACTIN Lab Results  Component Value Date   CHOL 139 12/10/2020   TRIG 93 12/10/2020   HDL 41 12/10/2020   CHOLHDL 3.4 12/10/2020   VLDL 19 12/10/2020   LDLCALC 79 12/10/2020    Physical Findings: AIMS: Facial and Oral Movements Muscles of Facial Expression: None, normal Lips and Perioral Area: None, normal Jaw: None, normal Tongue: None, normal,Extremity Movements Upper (arms, wrists, hands, fingers): None, normal Lower (legs, knees, ankles, toes): None, normal, Trunk Movements Neck, shoulders, hips: None, normal, Overall Severity Severity of abnormal movements (highest score from questions above): None, normal Incapacitation due to abnormal movements: None, normal Patient's awareness of abnormal movements (rate only patient's report): No Awareness, Dental Status Current problems with teeth and/or dentures?: No Does patient usually wear dentures?: No  CIWA:    COWS:     Musculoskeletal: Strength & Muscle Tone: within normal limits Gait & Station: normal Patient leans: N/A  Psychiatric Specialty Exam:  Presentation  General Appearance: Fairly Groomed; Casual  Eye Contact:Good  Speech:Normal Rate; Clear and Coherent  Speech Volume:Normal  Handedness:Right   Mood and Affect  Mood:Euthymic  Affect:Congruent   Thought Process  Thought Processes:Coherent; Goal Directed; Linear  Descriptions of  Associations:Intact  Orientation:Full (Time, Place and Person)  Thought Content:Logical  History of Schizophrenia/Schizoaffective disorder:No  Duration of Psychotic Symptoms:Greater than six months  Hallucinations:Hallucinations: None  Ideas of Reference:None  Suicidal Thoughts:Suicidal Thoughts: No  Homicidal Thoughts:Homicidal Thoughts: No   Sensorium  Memory:Immediate Good; Remote Fair; Recent Fair  Judgment:Fair  Insight:Fair   Executive Functions  Concentration:Good  Attention Span:Good  [place] of Knowledge:Good  Language:Good   Psychomotor Activity  Psychomotor Activity:Psychomotor Activity: Normal   Assets  Assets:Communication Skills; Desire for Improvement; Resilience; Social Support   Sleep  Sleep:Sleep: Good Number of Hours of Sleep: 6    Physical Exam: Physical Exam Vitals and nursing note reviewed.  HENT:     Head: Normocephalic and atraumatic.  Pulmonary:     Effort: Pulmonary effort is normal.  Neurological:     General: No focal deficit present.     Mental Status: He is alert and oriented to person, place, and time.    ROS Blood pressure 123/83, pulse 77, temperature 98 F (36.7 C), temperature source Oral, resp. rate 18, height _0  (1.727 m), weight 78 kg, SpO2 99 %. Body mass index is 26.15 kg/m.   Treatment Plan Summary: Daily contact with patient to assess and evaluate symptoms and progress in treatment and Medication management   Diagnosis: 1.  Bipolar disorder, most recently manic, severe with psychotic features 2.  History of polysubstance use disorders. 3.  Cannabis use disorder versus dependence.  Pertinent findings on examination today: 1.    No irritability is present today. 2.  He denies auditory or visual hallucinations during today's interview but has reported them intermittently to staff. 3.    Patient has been taking medications as prescribed.  Plan: 1. Continue Tegretol 100 mg p.o.  twice daily which was prescribed for mood stabilization.  The plan is to eventually discontinue Tegretol.   2. Continue Prolixin 10 mg p.o. twice daily for psychosis. 3. Continue hydroxyzine 25 mg p.o. 3 times daily as needed anxiety. 4. Continue Seroquel 200 mg p.o. nightly which is prescribed for mood stabilization and psychotic symptoms.    The plan is for eventual discontinuation of Seroquel. 5.  Continue lithium carbonate (Lithobid) 300 mg p.o.  nightly for mood stabilization. 6.  Disposition planning-in progress.   Arthor Captain, MD 12/14/2020, 1:14 PM

## 2020-12-15 DIAGNOSIS — F1220 Cannabis dependence, uncomplicated: Secondary | ICD-10-CM

## 2020-12-15 DIAGNOSIS — F3113 Bipolar disorder, current episode manic without psychotic features, severe: Secondary | ICD-10-CM

## 2020-12-15 DIAGNOSIS — F17200 Nicotine dependence, unspecified, uncomplicated: Secondary | ICD-10-CM

## 2020-12-15 DIAGNOSIS — F312 Bipolar disorder, current episode manic severe with psychotic features: Secondary | ICD-10-CM | POA: Diagnosis not present

## 2020-12-15 DIAGNOSIS — F411 Generalized anxiety disorder: Secondary | ICD-10-CM

## 2020-12-15 DIAGNOSIS — F319 Bipolar disorder, unspecified: Secondary | ICD-10-CM | POA: Diagnosis not present

## 2020-12-15 NOTE — Progress Notes (Signed)
Adult Psychoeducational Group Note  Date:  12/15/2020 Time:  1432 Group Topic/Focus:  Personal Choices and Values:   The focus of this group is to help patients assess and explore the importance of values in their lives, how their values affect their decisions, how they express their values and what opposes their expression.  Participation Level:  Minimal  Participation Quality:  Appropriate  Affect:  Appropriate  Cognitive:  Appropriate  Insight: Appropriate  Engagement in Group:  Engaged  Modes of Intervention:  Discussion and Education  Additional Comments:  Pt was only focused on his discharge date and time.  Quinterrius Errington E 12/15/2020, 6:17 PM

## 2020-12-15 NOTE — [facility] Suicide Risk Assessment (Signed)
[facility] INPATIENT:  Family/Significant Other Suicide Prevention Education  Suicide Prevention Education:  Contact Attempts: mother, Clinton Fisher [phone] has been identified by the patient as the family member/significant other with whom the patient will be residing, and identified as the person(s) who will aid the patient in the event of a mental health crisis.  With written consent from the patient, two attempts were made to provide suicide prevention education, prior to and/or following the patient's discharge.  We were unsuccessful in providing suicide prevention education.  A suicide education pamphlet was given to the patient to share with family/significant other.  Date and time of first attempt:12/15/2020 at 2:00PM CSW left HIPAA compliant voicemail for this patients mother. Date and time of second attempt: Second attempt still needs to be made.   Clinton Fisher 12/15/2020, 1:59 PM

## 2020-12-15 NOTE — [facility] Group Notes (Signed)
LCSW Group Therapy Notes  Type of Therapy and Topic: Group Therapy: Healthy Vs. Unhealthy Coping Strategies  Date and Time: 12/15/2020 1:00PM  Participation Level: [facility] PARTICIPATION LEVEL: Active  Description of Group:  In this group, patients will be encouraged to explore their healthy and unhealthy coping strategics. Coping strategies are actions that we take to deal with stress, problems, or uncomfortable emotions in our daily lives. Each patient will be challenged to read some scenarios and discuss the unhealthy and healthy coping strategies within those scenarios. Also, each patient will be challenged to describe current healthy and unhealthy strategies that they use in their own lives and discuss the outcomes and barriers to those strategies. This group will be process-oriented, with patients participating in exploration of their own experiences as well as giving and receiving support and challenge from other group members.  Therapeutic Goals: 1. Patient will identify personal healthy and unhealthy coping strategies. 2. Patient will identify healthy and unhealthy coping strategies, in others, through scenarios.  3. Patient will identify expected outcomes of healthy and unhealthy coping strategies. 4. Patient will identify barriers to using healthy coping strategies.   Summary of Patient Progress: Pt shared that he typically destroys things when he is upset, however shared that he in interested in healthier ways to cope. Pt shared that at times he will throw his phone at his pillow so that nothing will break.   Therapeutic Modalities:  Cognitive Behavioral Therapy Solution Focused Therapy Motivational Interviewing    Ruthann Cancer MSW, LCSW Clincal Social Worker  [facility]

## 2020-12-15 NOTE — Tx Team (Signed)
Interdisciplinary Treatment and Diagnostic Plan Update  12/15/2020 Time of Session:   Clinton Fisher MRN: [id number]  Principal Diagnosis: Bipolar I disorder, current or most recent episode manic, with psychotic features (HCC)  Secondary Diagnoses: Principal Problem:   Bipolar I disorder, current or most recent episode manic, with psychotic features (HCC)   Current Medications:  Current Facility-Administered Medications  Medication Dose Route Frequency Provider Last Rate Last Admin  . acetaminophen (TYLENOL) tablet 650 mg  650 mg Oral Q6H PRN White, Patrice L, NP   650 mg at 12/14/20 2029  . alum & mag hydroxide-simeth (MAALOX/MYLANTA) 200-200-20 MG/5ML suspension 30 mL  30 mL Oral Q4H PRN White, Patrice L, NP   30 mL at 12/13/20 1809  . carbamazepine (TEGRETOL) chewable tablet 100 mg  100 mg Oral BID Antonieta Pert, MD   100 mg at 12/15/20 4097  . fluPHENAZine (PROLIXIN) tablet 10 mg  10 mg Oral BID Antonieta Pert, MD   10 mg at 12/15/20 3532  . hydrOXYzine (ATARAX/VISTARIL) tablet 25 mg  25 mg Oral TID PRN Liborio Nixon L, NP   25 mg at 12/10/20 0820  . lithium carbonate (LITHOBID) CR tablet 300 mg  300 mg Oral QHS Antonieta Pert, MD   300 mg at 12/14/20 2028  . OLANZapine zydis (ZYPREXA) disintegrating tablet 10 mg  10 mg Oral Q8H PRN Antonieta Pert, MD       And  . LORazepam (ATIVAN) tablet 1 mg  1 mg Oral Q6H PRN Antonieta Pert, MD       And  . ziprasidone (GEODON) injection 20 mg  20 mg Intramuscular Q6H PRN Antonieta Pert, MD      . magnesium hydroxide (MILK OF MAGNESIA) suspension 30 mL  30 mL Oral Daily PRN White, Patrice L, NP      . nicotine (NICODERM CQ - dosed in mg/24 hours) patch 21 mg  21 mg Transdermal Daily Antonieta Pert, MD   21 mg at 12/15/20 0825  . QUEtiapine (SEROQUEL) tablet 200 mg  200 mg Oral QHS Antonieta Pert, MD   200 mg at 12/14/20 2028   PTA Medications: Medications Prior to Admission  Medication Sig Dispense Refill Last  Dose  . lidocaine (LIDODERM) 5 % Place 1 patch onto the skin daily. Remove & Discard patch within 12 hours or as directed by MD (Patient not taking: Reported on 12/09/2020) 10 patch 0   . nabumetone (RELAFEN) 750 MG tablet Take 1 tablet (750 mg total) by mouth 2 (two) times daily. (Patient not taking: Reported on 12/09/2020) 20 tablet 0     Patient Stressors: Financial difficulties Marital or family conflict Occupational concerns  Patient Strengths: Average or above average intelligence Supportive family/friends  Treatment Modalities: Medication Management, Group therapy, Case management,  1 to 1 session with clinician, Psychoeducation, Recreational therapy.   Physician Treatment Plan for Primary Diagnosis: Bipolar I disorder, current or most recent episode manic, with psychotic features (HCC) Long Term Goal(s): Improvement in symptoms so as ready for discharge Improvement in symptoms so as ready for discharge   Short Term Goals: Ability to identify changes in lifestyle to reduce recurrence of condition will improve Ability to verbalize feelings will improve Ability to disclose and discuss suicidal ideas Ability to demonstrate self-control will improve Ability to identify and develop effective coping behaviors will improve Ability to maintain clinical measurements within normal limits will improve Compliance with prescribed medications will improve Ability to identify triggers associated with substance  abuse/mental health issues will improve Ability to identify changes in lifestyle to reduce recurrence of condition will improve Ability to verbalize feelings will improve Ability to disclose and discuss suicidal ideas Ability to demonstrate self-control will improve Ability to identify and develop effective coping behaviors will improve Ability to maintain clinical measurements within normal limits will improve Compliance with prescribed medications will improve Ability to identify  triggers associated with substance abuse/mental health issues will improve  Medication Management: Evaluate patient's response, side effects, and tolerance of medication regimen.  Therapeutic Interventions: 1 to 1 sessions, Unit Group sessions and Medication administration.  Evaluation of Outcomes: Progressing  Physician Treatment Plan for Secondary Diagnosis: Principal Problem:   Bipolar I disorder, current or most recent episode manic, with psychotic features (HCC)  Long Term Goal(s): Improvement in symptoms so as ready for discharge Improvement in symptoms so as ready for discharge   Short Term Goals: Ability to identify changes in lifestyle to reduce recurrence of condition will improve Ability to verbalize feelings will improve Ability to disclose and discuss suicidal ideas Ability to demonstrate self-control will improve Ability to identify and develop effective coping behaviors will improve Ability to maintain clinical measurements within normal limits will improve Compliance with prescribed medications will improve Ability to identify triggers associated with substance abuse/mental health issues will improve Ability to identify changes in lifestyle to reduce recurrence of condition will improve Ability to verbalize feelings will improve Ability to disclose and discuss suicidal ideas Ability to demonstrate self-control will improve Ability to identify and develop effective coping behaviors will improve Ability to maintain clinical measurements within normal limits will improve Compliance with prescribed medications will improve Ability to identify triggers associated with substance abuse/mental health issues will improve     Medication Management: Evaluate patient's response, side effects, and tolerance of medication regimen.  Therapeutic Interventions: 1 to 1 sessions, Unit Group sessions and Medication administration.  Evaluation of Outcomes: Progressing   RN Treatment  Plan for Primary Diagnosis: Bipolar I disorder, current or most recent episode manic, with psychotic features (HCC) Long Term Goal(s): Knowledge of disease and therapeutic regimen to maintain health will improve  Short Term Goals: Ability to remain free from injury will improve, Ability to participate in decision making will improve and Ability to verbalize feelings will improve  Medication Management: RN will administer medications as ordered by provider, will assess and evaluate patient's response and provide education to patient for prescribed medication. RN will report any adverse and/or side effects to prescribing provider.  Therapeutic Interventions: 1 on 1 counseling sessions, Psychoeducation, Medication administration, Evaluate responses to treatment, Monitor vital signs and CBGs as ordered, Perform/monitor CIWA, COWS, AIMS and Fall Risk screenings as ordered, Perform wound care treatments as ordered.  Evaluation of Outcomes: Progressing   LCSW Treatment Plan for Primary Diagnosis: Bipolar I disorder, current or most recent episode manic, with psychotic features (HCC) Long Term Goal(s): Safe transition to appropriate next level of care at discharge, Engage patient in therapeutic group addressing interpersonal concerns.  Short Term Goals: Engage patient in aftercare planning with referrals and resources, Increase social support and Increase ability to appropriately verbalize feelings  Therapeutic Interventions: Assess for all discharge needs, 1 to 1 time with Social worker, Explore available resources and support systems, Assess for adequacy in community support network, Educate family and significant other(s) on suicide prevention, Complete Psychosocial Assessment, Interpersonal group therapy.  Evaluation of Outcomes: Progressing   Progress in Treatment: Attending groups: Yes. Participating in groups: Yes. Taking medication as prescribed:  Yes. Toleration medication:  Yes. Family/Significant other contact made: Yes, individual(s) contacted:  Mother Patient understands diagnosis: No. Discussing patient identified problems/goals with staff: Yes. Medical problems stabilized or resolved: Yes. Denies suicidal/homicidal ideation: Yes. Issues/concerns per patient self-inventory: No. Other: None  New problem(s) identified: No, Describe:  None  New Short Term/Long Term Goal(s):medication stabilization, elimination of SI thoughts, development of comprehensive mental wellness plan.  Patient Goals: "no goals...but to live on my own....to be heard for the first time in 13 years."   Discharge Plan or Barriers: Patient recently admitted. CSW will continue to follow and assess for appropriate referrals and possible discharge planning.  Reason for Continuation of Hospitalization: Homicidal ideation Mania Medication stabilization Suicidal ideation  Estimated Length of Stay: 3-5 days  Attendees: Patient: Clinton Fisher  12/15/2020   Physician: Bartholomew Crews, MD 12/15/2020   Nursing:  12/15/2020   RN Care Manager: 12/15/2020   Social Worker: Melba Coon, LCSWA 12/15/2020   Recreational Therapist:  12/15/2020   Other:  12/15/2020   Other:  12/15/2020   Other: 12/15/2020       Scribe for Treatment Team: Aram Beecham, LCSWA 12/15/2020 2:54 PM

## 2020-12-15 NOTE — Progress Notes (Signed)
Recreation Therapy Notes  Date: 5.25.22 Time: 1000 Location: [address] Dayroom   Group Topic: Self-esteem  Goal Area(s) Addresses:  Patient will identify and write various positive traits about themselves. Patients will also identify things/activities that interest them. Patient will acknowledge the benefit of healthy self-esteem. Patient will endorse understanding of ways to increase self-esteem.    Intervention: Personalized Plate- printed license plate template, markers, colored pencils, music   Activity: LRT were given a blank outline of a license plate.  Patients were to design the plate anyway the chose to represent the things/activities they like and write positive traits about themselves.  Patients could use words or draw pictures to represent these things.  LRT also played music in the backgroup as patients worked on their individual license plates.   Education: LRT educated patients on the importance of healthy self-esteem and ways to build self-esteem. LRT addressed discharge planning reviewing positive coping skills and healthy support systems.  Education Outcome: Acknowledges education/In group clarification offered   Clinical Observations/Feedback: Pt did not attend group session.    Caroll Rancher, LRT/CTRS         Caroll Rancher A 12/15/2020 1:14 PM

## 2020-12-15 NOTE — [facility] Suicide Risk Assessment (Signed)
[facility] INPATIENT:  Family/Significant Other Suicide Prevention Education  Suicide Prevention Education:  Education Completed; mother, Gean Larose [phone]  has been identified by the patient as the family member/significant other with whom the patient will be residing, and identified as the person(s) who will aid the patient in the event of Clinton mental health crisis (suicidal ideations/suicide attempt).  With written consent from the patient, the family member/significant other has been provided the following suicide prevention education, prior to the and/or following the discharge of the patient.   CSW spoke with this patients mother who confirmed that this patient is unable to return home at discharge. She states that she does not feel that much has changed since coming in to the hospital. She states instead of blaming corporations he is now blaming his mother for not teaching him certain things. Per mother she cannot have Clinton complete conversation or teach/tell him anything. She states he interrupts her, she is unable to finish sentences, he questions her and her knowledge and tells her she is wrong. She reports that he has been kicked out of her house three times due to not abiding by the rules, although he will say he moved out on his own. She reports there is no family members he could stay with as her mother is disabled.     The suicide prevention education provided includes the following:  Suicide risk factors  Suicide prevention and interventions  National Suicide Hotline telephone number  [facility] assessment telephone number  [facility] Emergency Assistance 911  [facility] and/or Residential Mobile Crisis Unit telephone number  Request made of family/significant other to:  Remove weapons (e.g., guns, rifles, knives), all items previously/currently identified as safety concern.    Remove drugs/medications (over-the-counter, prescriptions, illicit drugs),  all items previously/currently identified as Clinton safety concern.  The family member/significant other verbalizes understanding of the suicide prevention education information provided.  The family member/significant other agrees to remove the items of safety concern listed above.  Clinton Fisher Clinton Fisher 12/15/2020, 2:40 PM

## 2020-12-15 NOTE — Progress Notes (Addendum)
   12/14/20 2030  Psych Admission Type (Psych Patients Only)  Admission Status Voluntary  Psychosocial Assessment  Patient Complaints Anxiety  Eye Contact Fair  Facial Expression Anxious  Affect Anxious;Appropriate to circumstance  Speech Logical/coherent;Pressured;Tangential  Interaction Assertive  Motor Activity Slow;Fidgety  Appearance/Hygiene Unremarkable  Behavior Characteristics Cooperative  Mood Anxious;Pleasant  Thought Process  Coherency WDL  Content WDL  Delusions None reported or observed  Perception Hallucinations  Hallucination Auditory (no different from normal)  Judgment Poor  Confusion None  Danger to Self  Current suicidal ideation? Denies  Danger to Others  Danger to Others None reported or observed   Pt denies SI, HI. Endorses AH but says they are about at the level where they normally are. Rates pain 6/10 as chronic in right knee. Takes meds as prescribed.  Was told by day shift nurse that pt's mother does not want him to return to the home, thinking of the safety of her other children in the house.

## 2020-12-15 NOTE — Progress Notes (Signed)
[facility] MD Progress Note  12/15/2020 6:29 PM Clinton Fisher  MRN:  [id number]   Reason for admission:  Clinton Fisher is a 30 year old male with a reported past psychiatric history significant for bipolar disorder; most recently manic, severe with psychotic features who originally presented to the behavioral health urgent care center on 12/08/2020. He was labile, tangential, and the mother was concerned about the patient's safety. She also stated she did not trust him not to hurt her. He was admitted to the hospital for evaluation and stabilization.  Objective: Medical record reviewed.  Patient's case discussed in detail with members of the treatment team.  Social work and nursing notes are reviewed.  Per social work note, patient will be unable to return home to living with mother after discharge.  I met with and evaluated patient today on the unit of for follow-up.  The patient reports that he is feeling more anxious.  He states he feels like he is being caged here.  He otherwise feels his mood is improved, and is hoping for discharge soon.  He states that he intends to be compliant with medications after discharge, "I will never forget my medicines."  He states he feels much better with his current medication regimen.  He does continue to endorse vague auditory hallucinations where he feels that his name is being called.  He states he will look to see who is calling him and then realized that it is probably ghosts.  Patient continues with some persecutory delusions of "getting tricked out of my disability because they saw me playing basketball."  He states that he has excruciating knee pain if he has to walk.  He states that he is able to stand and sit to work, and notes that when he was playing basketball he was not running, but instead just standing and throwing baskets.  Patient also states that he is currently unemployed.  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  He states that after he began to refuse to drive the brother, he was fired.  He denies any thoughts of harming others even when feeling wronged.  He denies any suicidal ideation or homicidal ideation.  He is denying any auditory or visual hallucinations today. He reports good appetite, and states he has been sleeping well.  The patient slept 9.25 hours last night.  No new labs.  Vital signs today: BP 115/74 (BP Location: Right Arm)   Pulse 87   Temp 98 F (36.7 C) (Oral)   Resp 18   Ht '5\' 8"'  (1.727 m)   Wt 78 kg   SpO2 99%   BMI 26.15 kg/m  The patient has been attending groups intermittently and participating appropriately.  He has been taking standing dose medications as prescribed.  He has been requiring hydroxyzine as needed for anxiety.  Principal Problem: Bipolar I disorder, current or most recent episode manic, with psychotic features ([place]) Diagnosis: Principal Problem:   Bipolar I disorder, current or most recent episode manic, with psychotic features ([place]) Active Problems:   Cannabis dependence, continuous ([place])   GAD (generalized anxiety disorder)   Tobacco use disorder  Total Time spent with patient: 35 minutes  Past Psychiatric History: See admission H&P  Past Medical History:  Past Medical History:  Diagnosis Date  . Abrasion of index finger 07/29/2013  left  . Bipolar I disorder, current or most recent episode manic, severe ([place]) 12/14/2020  . Bipolar I disorder, current or most recent episode manic, with psychotic features ([place]) 12/14/2020  . 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 GERD (gastroesophageal reflux disease)    no current med.  . Manic affective disorder with recurrent episode ([place])   . Seasonal allergies    sore throat 07/29/2013    Past Surgical History:  Procedure Laterality Date  . FOOT FOREIGN BODY REMOVAL Right 01/19/2011  .  KNEE ARTHROSCOPY Right 08/01/2013   Procedure: ARTHROSCOPY RIGHT KNEE, Chondroplasty, Excision of [place];  Surgeon: Alta Corning, MD;  Location: [facility];  Service: Orthopedics;  Laterality: Right;   Family History: History reviewed. No pertinent family history. Family Psychiatric  History: See admission H&P Social History:  Social History   Substance and Sexual Activity  Alcohol Use Yes   Comment: every 3 days     Social History   Substance and Sexual Activity  Drug Use No    Social History   Socioeconomic History  . Marital status: Single    Spouse name: Not on file  . Number of children: Not on file  . Years of education: Not on file  . Highest education level: Not on file  Occupational History  . Not on file  Tobacco Use  . Smoking status: Current Every Day Smoker  . Smokeless tobacco: Never Used  Substance and Sexual Activity  . Alcohol use: Yes    Comment: every 3 days  . Drug use: No  . Sexual activity: Not on file  Other Topics Concern  . Not on file  Social History Narrative  . Not on file   Social Determinants of Health   Financial Resource Strain: Not on file  Food Insecurity: Not on file  Transportation Needs: Not on file  Physical Activity: Not on file  Stress: Not on file  Social Connections: Not on file   Additional Social History:                         Sleep: Good  Appetite:  Good  Current Medications: Current Facility-Administered Medications  Medication Dose Route Frequency Provider Last Rate Last Admin  . acetaminophen (TYLENOL) tablet 650 mg  650 mg Oral Q6H PRN White, Patrice L, NP   650 mg at 12/14/20 2029  . alum & mag hydroxide-simeth (MAALOX/MYLANTA) 200-200-20 MG/5ML suspension 30 mL  30 mL Oral Q4H PRN White, Patrice L, NP   30 mL at 12/13/20 1809  . carbamazepine (TEGRETOL) chewable tablet 100 mg  100 mg Oral BID Sharma Covert, MD   100 mg at 12/15/20 1636  . fluPHENAZine (PROLIXIN) tablet 10 mg   10 mg Oral BID Sharma Covert, MD   10 mg at 12/15/20 1636  . hydrOXYzine (ATARAX/VISTARIL) tablet 25 mg  25 mg Oral TID PRN White, Patrice L, NP   25 mg at 12/15/20 1635  . lithium carbonate (LITHOBID) CR tablet 300 mg  300 mg Oral QHS Sharma Covert, MD   300 mg at 12/14/20 2028  . OLANZapine zydis (ZYPREXA) disintegrating tablet 10 mg  10 mg Oral Q8H PRN Sharma Covert, MD       And  . LORazepam (ATIVAN) tablet 1 mg  1 mg Oral Q6H PRN Sharma Covert, MD       And  . ziprasidone (GEODON) injection 20 mg  20 mg Intramuscular Q6H PRN Sharma Covert, MD      . magnesium hydroxide (MILK OF MAGNESIA) suspension 30 mL  30 mL Oral Daily PRN White, Patrice L, NP      . nicotine (NICODERM CQ - dosed in mg/24 hours) patch 21 mg  21 mg Transdermal Daily Sharma Covert, MD   21 mg at 12/15/20 0825  . QUEtiapine (SEROQUEL) tablet 200 mg  200 mg Oral QHS Sharma Covert, MD   200 mg at 12/14/20 2028    Lab Results: No results found for this or any previous visit (from the past 48 hour(s)).  Blood Alcohol level:  Lab Results  Component Value Date   ETH <10 45/99/7741    Metabolic Disorder Labs: Lab Results  Component Value Date   HGBA1C 5.3 12/10/2020   MPG 105.41 12/10/2020   No results found for: PROLACTIN Lab Results  Component Value Date   CHOL 139 12/10/2020   TRIG 93 12/10/2020   HDL 41 12/10/2020   CHOLHDL 3.4 12/10/2020   VLDL 19 12/10/2020   LDLCALC 79 12/10/2020    Physical Findings: AIMS: Facial and Oral Movements Muscles of Facial Expression: None, normal Lips and Perioral Area: None, normal Jaw: None, normal Tongue: None, normal,Extremity Movements Upper (arms, wrists, hands, fingers): None, normal Lower (legs, knees, ankles, toes): None, normal, Trunk Movements Neck, shoulders, hips: None, normal, Overall Severity Severity of abnormal movements (highest score from questions above): None, normal Incapacitation due to abnormal movements: None,  normal Patient's awareness of abnormal movements (rate only patient's report): No Awareness, Dental Status Current problems with teeth and/or dentures?: No Does patient usually wear dentures?: No  CIWA:    COWS:     Musculoskeletal: Strength & Muscle Tone: within normal limits Gait & Station: normal Patient leans: N/A  Psychiatric Specialty Exam:  Presentation  General Appearance: Casual; Fairly Groomed  Eye Contact:Good  Speech:Clear and Coherent  Speech Volume:Normal  Handedness:Right   Mood and Affect  Mood:Anxious; Dysphoric  Affect:Appropriate   Thought Process  Thought Processes:Goal Directed  Descriptions of Associations:Tangential  Orientation:Partial (knows month, year, presidents)  Thought Content:Logical  History of Schizophrenia/Schizoaffective disorder:No  Duration of Psychotic Symptoms:Greater than six months  Hallucinations:Hallucinations: None  Ideas of Reference:None  Suicidal Thoughts:Suicidal Thoughts: No  Homicidal Thoughts:Homicidal Thoughts: No   Sensorium  Memory:Immediate Good; Recent Fair; Remote Fair  Judgment:Fair  Insight:Shallow   Executive Functions  Concentration:Good  Attention Span:Good  [place] of Knowledge:Good  Language:Good   Psychomotor Activity  Psychomotor Activity:Psychomotor Activity: Normal   Assets  Assets:Communication Skills; Desire for Improvement; Resilience   Sleep  Sleep:Sleep: Good Number of Hours of Sleep: 9.25    Physical Exam: Physical Exam Vitals and nursing note reviewed.  HENT:     Head: Normocephalic and atraumatic.  Pulmonary:     Effort: Pulmonary effort is normal.  Neurological:     General: No focal deficit present.     Mental Status: He is alert and oriented to person, place, and time.    Review of Systems  Constitutional: Negative.   Respiratory: Negative.   Cardiovascular: Negative.   Gastrointestinal: Negative.   Musculoskeletal: Positive  for joint pain (knee).  Psychiatric/Behavioral: Positive for hallucinations. Negative for substance abuse and suicidal ideas. The patient is nervous/anxious. The patient does not have insomnia.    Blood pressure 115/74, pulse 87, temperature 98 F (36.7 C), temperature source Oral, resp. rate 18, height '5\' 8"'  (1.727 m), weight 78 kg, SpO2 99 %.  Body mass index is 26.15 kg/m.   Treatment Plan Summary: Daily contact with patient to assess and evaluate symptoms and progress in treatment and Medication management   Diagnosis: 1.  Bipolar disorder, most recently manic, severe with psychotic features 2.  History of polysubstance use disorders. 3.  Cannabis use disorder versus dependence.  Pertinent findings on examination today: 1.    No irritability is present today, however he has not yet been notified of mother not allowing him home and need for new discharge planning 2.   He endorses auditory hallucinations and denies visual hallucinations during today's interview 3.    Patient has been taking medications as prescribed, and requiring as needed hydroxyzine.  Plan: 1. Continue Tegretol 100 mg p.o. twice daily which was prescribed for mood stabilization.  The plan is to eventually discontinue Tegretol.   2. Continue Prolixin 10 mg p.o. twice daily for psychosis. 3. Continue hydroxyzine 25 mg p.o. 3 times daily as needed anxiety. 4. Continue Seroquel 200 mg p.o. nightly which is prescribed for mood stabilization and psychotic symptoms.    The plan is for eventual discontinuation of Seroquel. 5.  Continue lithium carbonate (Lithobid) 300 mg p.o. nightly for mood stabilization. 6.  Disposition planning-in progress.  Appreciate social work assistance in helping patient create a new discharge plan.   Lavella Hammock, MD 12/15/2020, 6:29 PM

## 2020-12-16 DIAGNOSIS — F312 Bipolar disorder, current episode manic severe with psychotic features: Secondary | ICD-10-CM | POA: Diagnosis not present

## 2020-12-16 NOTE — Progress Notes (Signed)
Recreation Therapy Notes  Date: 5.26.22 Time: 1000 Location: [address] Dayroom  Group Topic: Communication, Team Building, Problem Solving  Goal Area(s) Addresses:  Patient will effectively work with peer towards shared goal.  Patient will identify skills used to make activity successful.  Patient will identify how skills used during activity can be applied to reach post d/c goals.   Behavioral Response: Engaged  Intervention: STEM Activity- Glass blower/designer  Activity: Tallest [employer]. In teams of 5-6, patients were given 15 craft pipe cleaners. Using the materials provided, patients were instructed to compete again the opposing team(s) to build the tallest free-standing structure from floor level. The activity was timed; difficulty increased by Clinical research associate as Production designer, theatre/television/film continued.  Systematically resources were removed with additional directions for example, placing one arm behind their back, working in silence, and shape stipulations. LRT facilitated post-activity discussion reviewing team processes and necessary communication skills involved in completion. Patients were encouraged to reflect how the skills utilized, or not utilized, in this activity can be incorporated to positively impact support systems post discharge.  Education: Pharmacist, community, Scientist, physiological, Discharge Planning   Education Outcome: Acknowledges education/In group clarification offered/Needs additional education.   Clinical Observations/Feedback: Pt was bright and intrigued as to how the group would be able to use the supplies given to complete the activity.  Pt expressed using one hand was the hardest part of the activity.  Pt also expressed the group had to use eye contact to communicate with each other.  Pt explained with the support system communication in very important and gave the example of talking to someone and seeing them become physically agitated that would a sign for pt to back off.  Pt also  stated the activity showed that everyone has something to offer in the activity and with your support system.  In addition, pt expressed valuing the opinion of a peer who didn't feel as confident about themselves.    Caroll Rancher, LRT/CTRS     Caroll Rancher A 12/16/2020 12:39 PM

## 2020-12-16 NOTE — [facility] Group Notes (Signed)
Occupational Therapy Group Note Date: 12/16/2020 Group Topic/Focus: Feelings Management  Group Description: Group encouraged increased engagement and participation through discussion focused on Building Happiness. Patients were provided a handout and reviewed therapeutic strategies to build happiness including identifying gratitudes, random acts of kindness, exercise, meditation, positive journaling, and fostering relationships. Patients engaged in discussion and encouraged to reflect on each strategy and their experiences.  Therapeutic Goal(s): Identify strategies to build happiness. Identify and implement therapeutic strategies to improve overall mood. Practice and identify gratitudes, random acts of kindness, exercise, meditation, positive journaling, and fostering relationships Participation Level: Patient did not attend OT group session despite personal invitation.   Plan: Continue to engage patient in OT groups 2 - 3x/week.  12/16/2020  Ellyse Rotolo, MOT, OTR/L   

## 2020-12-16 NOTE — Progress Notes (Signed)
Progress note    12/16/20 0804  Psych Admission Type (Psych Patients Only)  Admission Status Voluntary  Psychosocial Assessment  Patient Complaints Anxiety  Eye Contact Fair  Facial Expression Anxious  Affect Anxious;Appropriate to circumstance  Speech Logical/coherent  Interaction Assertive  Motor Activity Fidgety  Appearance/Hygiene Unremarkable  Behavior Characteristics Cooperative;Appropriate to situation;Anxious  Mood Anxious;Pleasant  Thought Process  Coherency Concrete thinking  Content WDL  Delusions None reported or observed  Perception WDL  Hallucination None reported or observed  Judgment Poor  Confusion None  Danger to Self  Current suicidal ideation? Denies  Danger to Others  Danger to Others None reported or observed

## 2020-12-16 NOTE — [facility] Group Notes (Signed)
Pt. Was sleeping and didn't attend wrap-up group.

## 2020-12-16 NOTE — [facility] Group Notes (Signed)
Pt did not attend wrap up group this evening. Pt was in bed sleeping.  

## 2020-12-16 NOTE — Progress Notes (Signed)
   12/15/20 1037  Psych Admission Type (Psych Patients Only)  Admission Status Voluntary  Psychosocial Assessment  Patient Complaints Anxiety ("Not knowing where I'm going when I leave hered. My mom don't want me home anymore")  Eye Contact Fair  Facial Expression Anxious;Worried  Affect Appropriate to circumstance  Speech Logical/coherent;Pressured  Interaction Assertive;Forwards little  Motor Activity Fidgety;Restless  Appearance/Hygiene Unremarkable  Behavior Characteristics Cooperative  Mood Anxious;Pleasant  Thought Process  Coherency WDL  Content WDL  Delusions None reported or observed  Perception Hallucinations  Hallucination Auditory ("They are not as loud as before")  Judgment Limited  Confusion None  Danger to Self  Current suicidal ideation? Denies  Danger to Others  Danger to Others None reported or observed

## 2020-12-16 NOTE — Progress Notes (Signed)
[facility] MD Progress Note  12/16/2020 2:37 PM Clinton Fisher  MRN:  [id number]   Subjective: Clinton Fisher reports, "I'm feeling better today, just a little upset because my mom would not let me come home after discharge. I have thought about it, slept on it & cried a little bit last night. I'm okay now. Now, I'm thinking about what to do next, may be call my dad to see if he will let me come live him in [place], [place]. But even going to my dad's house in [place] is not promising either because he works all the time. He may say that he will not be able to keep eye on me because he does not have the time to do it. But, trying to see if he will let come live with him is worth trying because I don't have any other choice. The SW also told me that she will bring me some resources for housing, but I have not seen her yet. My mood is not the greatest now that I do not have a place to go to after discharge. My depression today is #3 & anxiety is #4 just because of my housing situation. Mentally & emotionally, I'm doing better". Daily notes: During this follow-up care evaluation, Clinton Fisher presents tearful, but reasonable & understanding about his mother's decision to not allow him to return to her home after discharge. He currently denies any SIHI, AVH, delusional thoughts or paranoia. He does not appear to be responding to any internal stimuli. He is assured that the social worker will bring him the resources as she has promised him. Support & encouragement provided. Informed the social worker about Clinton Fisher's intention to try to call his dad for accomodation at his home after discharge.  Reason for admission:  Clinton Fisher is a 30 year old male with a reported past psychiatric history significant for bipolar disorder; most recently manic, severe with psychotic features who originally presented to the behavioral health urgent care center on 12/08/2020. He was labile, tangential, and the mother was concerned about the patient's  safety. She also stated she did not trust him not to hurt her. He was admitted to the hospital for evaluation and stabilization.  (Previous notes) Objective: Medical record reviewed.  Patient's case discussed in detail with members of the treatment team.  Social work and nursing notes are reviewed.  Per social work note, patient will be unable to return home to living with mother after discharge.  I met with and evaluated patient today on the unit of for follow-up.  The patient reports that he is feeling more anxious.  He states he feels like he is being caged here.  He otherwise feels his mood is improved, and is hoping for discharge soon.  He states that he intends to be compliant with medications after discharge, "I will never forget my medicines."  He states he feels much better with his current medication regimen.  He does continue to endorse vague auditory hallucinations where he feels that his name is being called.  He states he will look to see who is calling him and then realized that it is probably ghosts.  Patient continues with some persecutory delusions of "getting tricked out of my disability because they saw me playing basketball."  He states that he has excruciating knee pain if he has to walk.  He states that he is able to stand and sit to work, and notes that when he was playing basketball he was not running, but instead  just standing and throwing baskets.  Patient also states that he is currently unemployed.  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  He states that after he began to refuse to drive the brother, he was fired.  He denies any thoughts of harming others even when feeling wronged.  He denies any suicidal ideation or homicidal ideation.  He is denying any auditory or visual hallucinations today. He reports good appetite, and states  he has been sleeping well.  The patient slept 9.25 hours last night.  No new labs.  Vital signs today: BP 115/74 (BP Location: Right Arm)   Pulse 87   Temp 98 F (36.7 C) (Oral)   Resp 18   Ht _0  (1.727 m)   Wt 78 kg   SpO2 99%   BMI 26.15 kg/m  The patient has been attending groups intermittently and participating appropriately.  He has been taking standing dose medications as prescribed.  He has been requiring hydroxyzine as needed for anxiety.  Principal Problem: Bipolar I disorder, current or most recent episode manic, with psychotic features ([place]) Diagnosis: Principal Problem:   Bipolar I disorder, current or most recent episode manic, with psychotic features ([place]) Active Problems:   Cannabis dependence, continuous ([place])   GAD (generalized anxiety disorder)   Tobacco use disorder  Total Time spent with patient: 15 minutes  Past Psychiatric History: See admission H&P  Past Medical History:  Past Medical History:  Diagnosis Date  . Abrasion of index finger 07/29/2013   left  . Bipolar I disorder, current or most recent episode manic, severe ([place]) 12/14/2020  . Bipolar I disorder, current or most recent episode manic, with psychotic features ([place]) 12/14/2020  . 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 GERD (gastroesophageal reflux disease)    no current med.  . Manic affective disorder with recurrent episode ([place])   . Seasonal allergies    sore throat 07/29/2013    Past Surgical History:  Procedure Laterality Date  . FOOT FOREIGN BODY REMOVAL Right 01/19/2011  . KNEE ARTHROSCOPY Right 08/01/2013   Procedure: ARTHROSCOPY RIGHT KNEE, Chondroplasty, Excision of [place];  Surgeon: Alta Corning, MD;  Location: [facility];  Service: Orthopedics;  Laterality: Right;   Family History: History reviewed. No pertinent family history.  Family Psychiatric  History: See admission H&P  Social History:  Social History   Substance and Sexual Activity  Alcohol Use  Yes   Comment: every 3 days     Social History   Substance and Sexual Activity  Drug Use No    Social History   Socioeconomic History  . Marital status: Single    Spouse name: Not on file  . Number of children: Not on file  . Years of education: Not on file  . Highest education level: Not on file  Occupational History  . Not on file  Tobacco Use  . Smoking status: Current Every Day Smoker  . Smokeless tobacco: Never Used  Substance and Sexual Activity  . Alcohol use: Yes    Comment: every 3 days  . Drug use: No  . Sexual activity: Not on file  Other Topics Concern  . Not on file  Social History Narrative  . Not on file   Social Determinants of Health   Financial Resource Strain: Not on  file  Food Insecurity: Not on file  Transportation Needs: Not on file  Physical Activity: Not on file  Stress: Not on file  Social Connections: Not on file   Additional Social History:   Sleep: Good  Appetite:  Good  Current Medications: Current Facility-Administered Medications  Medication Dose Route Frequency Provider Last Rate Last Admin  . acetaminophen (TYLENOL) tablet 650 mg  650 mg Oral Q6H PRN White, Patrice L, NP   650 mg at 12/14/20 2029  . alum & mag hydroxide-simeth (MAALOX/MYLANTA) 200-200-20 MG/5ML suspension 30 mL  30 mL Oral Q4H PRN White, Patrice L, NP   30 mL at 12/13/20 1809  . carbamazepine (TEGRETOL) chewable tablet 100 mg  100 mg Oral BID Sharma Covert, MD   100 mg at 12/16/20 0804  . fluPHENAZine (PROLIXIN) tablet 10 mg  10 mg Oral BID Sharma Covert, MD   10 mg at 12/16/20 0804  . hydrOXYzine (ATARAX/VISTARIL) tablet 25 mg  25 mg Oral TID PRN White, Patrice L, NP   25 mg at 12/15/20 1635  . lithium carbonate (LITHOBID) CR tablet 300 mg  300 mg Oral QHS Sharma Covert, MD   300 mg at 12/15/20 2110  . OLANZapine zydis (ZYPREXA) disintegrating tablet 10 mg  10 mg Oral Q8H PRN Sharma Covert, MD       And  . LORazepam (ATIVAN) tablet 1 mg  1  mg Oral Q6H PRN Sharma Covert, MD   1 mg at 12/16/20 1057   And  . ziprasidone (GEODON) injection 20 mg  20 mg Intramuscular Q6H PRN Sharma Covert, MD      . magnesium hydroxide (MILK OF MAGNESIA) suspension 30 mL  30 mL Oral Daily PRN White, Patrice L, NP      . nicotine (NICODERM CQ - dosed in mg/24 hours) patch 21 mg  21 mg Transdermal Daily Sharma Covert, MD   21 mg at 12/16/20 0803  . QUEtiapine (SEROQUEL) tablet 200 mg  200 mg Oral QHS Sharma Covert, MD   200 mg at 12/15/20 2109   Lab Results: No results found for this or any previous visit (from the past 48 hour(s)).  Blood Alcohol level:  Lab Results  Component Value Date   ETH <10 14/78/2956   Metabolic Disorder Labs: Lab Results  Component Value Date   HGBA1C 5.3 12/10/2020   MPG 105.41 12/10/2020   No results found for: PROLACTIN Lab Results  Component Value Date   CHOL 139 12/10/2020   TRIG 93 12/10/2020   HDL 41 12/10/2020   CHOLHDL 3.4 12/10/2020   VLDL 19 12/10/2020   LDLCALC 79 12/10/2020   Physical Findings: AIMS: Facial and Oral Movements Muscles of Facial Expression: None, normal Lips and Perioral Area: None, normal Jaw: None, normal Tongue: None, normal,Extremity Movements Upper (arms, wrists, hands, fingers): None, normal Lower (legs, knees, ankles, toes): None, normal, Trunk Movements Neck, shoulders, hips: None, normal, Overall Severity Severity of abnormal movements (highest score from questions above): None, normal Incapacitation due to abnormal movements: None, normal Patient's awareness of abnormal movements (rate only patient's report): No Awareness, Dental Status Current problems with teeth and/or dentures?: No Does patient usually wear dentures?: No  CIWA:    COWS:     Musculoskeletal: Strength & Muscle Tone: within normal limits Gait & Station: normal Patient leans: N/A  Psychiatric Specialty Exam:  Presentation  General Appearance: Casual; Fairly Groomed  Eye  Contact:Good  Speech:Clear and Coherent  Speech Volume:Normal  Handedness:Right  Mood and Affect  Mood:Anxious; Dysphoric  Affect:Appropriate  Thought Process  Thought Processes:Goal Directed  Descriptions of Associations:Tangential  Orientation:Partial (knows month, year, presidents)  Thought Content:Logical  History of Schizophrenia/Schizoaffective disorder:No  Duration of Psychotic Symptoms:Greater than six months  Hallucinations:Hallucinations: None  Ideas of Reference:None  Suicidal Thoughts:Suicidal Thoughts: No  Homicidal Thoughts:Homicidal Thoughts: No  Sensorium  Memory:Immediate Good; Recent Fair; Remote Fair  Judgment:Fair  Insight:Shallow  Executive Functions  Concentration:Good  Attention Span:Good  [place] of Knowledge:Good  Language:Good  Psychomotor Activity  Psychomotor Activity:Psychomotor Activity: Normal  Assets  Assets:Communication Skills; Desire for Improvement; Resilience  Sleep  Sleep:Number of Hours of Sleep: 9.25  Physical Exam: Physical Exam Vitals and nursing note reviewed.  HENT:     Head: Normocephalic and atraumatic.     Nose: Nose normal.     Mouth/Throat:     Pharynx: Oropharynx is clear.  Eyes:     Pupils: Pupils are equal, round, and reactive to light.  Cardiovascular:     Rate and Rhythm: Normal rate.     Pulses: Normal pulses.  Pulmonary:     Effort: Pulmonary effort is normal.  Genitourinary:    Comments: Deferred Musculoskeletal:        General: Normal range of motion.     Cervical back: Normal range of motion.  Skin:    General: Skin is warm and dry.  Neurological:     General: No focal deficit present.     Mental Status: He is alert and oriented to person, place, and time.    Review of Systems  Constitutional: Negative.   HENT: Negative.   Eyes: Negative.   Respiratory: Negative.   Cardiovascular: Negative.   Gastrointestinal: Negative.   Genitourinary: Negative.    Musculoskeletal: Negative.  Joint pain: knee.  Skin: Negative.   Neurological: Negative for dizziness, tingling, tremors, sensory change, speech change, focal weakness, seizures, loss of consciousness, weakness and headaches.  Endo/Heme/Allergies:       Allergies: Augmentin, Trazodone  Psychiatric/Behavioral: Positive for hallucinations (Stable). Negative for depression, memory loss, substance abuse and suicidal ideas. The patient is nervous/anxious (Stable). The patient does not have insomnia.    Blood pressure 115/74, pulse 87, temperature 98 F (36.7 C), temperature source Oral, resp. rate 18, height _0  (1.727 m), weight 78 kg, SpO2 99 %. Body mass index is 26.15 kg/m.  Treatment Plan Summary: Daily contact with patient to assess and evaluate symptoms and progress in treatment and Medication management.   Continue inpatient hospitalization.  Will continue today 12/16/2020 plan as below except where it is noted.  Diagnosis: 1.  Bipolar disorder, most recently manic, severe with psychotic features 2.  History of polysubstance use disorders. 3.  Cannabis use disorder versus dependence.  Pertinent findings on examination today: 1.    No irritability is present today, however he is aware & has been notified of mother not allowing him home and need for new discharge planning. 2.   He endorses denies both hallucinations & visual hallucinations during today's interview 3.    Patient has been taking medications as prescribed, and requiring as needed hydroxyzine.  Plan: 1. Continue Tegretol 100 mg p.o. twice daily which was prescribed for mood stabilization.  The plan is to eventually discontinue Tegretol.   2. Continue Prolixin 10 mg p.o. twice daily for psychosis. 3. Continue hydroxyzine 25 mg p.o. 3 times daily as needed anxiety. 4. Continue Seroquel 200 mg p.o. nightly which is prescribed for mood stabilization and psychotic  symptoms.    The plan is for eventual discontinuation of  Seroquel. 5.  Continue lithium carbonate (Lithobid) 300 mg p.o. nightly for mood stabilization. 6.  Disposition planning-in progress.  Appreciate social work assistance in helping patient create a new discharge plan.  Lindell Spar, NP, pmhnp, fnp-bc 12/16/2020, 2:37 PMPatient ID: Clinton Fisher, male   DOB: [birth year], 30 y.o.   MRN: [id number]

## 2020-12-16 NOTE — B[facility] Counselor (Signed)
CSW spoke with pt's mother who agrees to bring this patients cell phone to [facility] later this date.   Per pt's mother pt's father has a limited understanding of their sons mental health and believes that pt is faking all of it in order to get disability.     Ruthann Cancer MSW, LCSW Clincal Social Worker  [facility]

## 2020-12-16 NOTE — Plan of Care (Signed)
  Problem: Health Behavior/Discharge Planning: Goal: Compliance with therapeutic regimen will improve Outcome: Progressing   Problem: Self-Concept: Goal: Will verbalize positive feelings about self Outcome: Progressing Goal: Level of anxiety will decrease Outcome: Progressing   

## 2020-12-17 DIAGNOSIS — F312 Bipolar disorder, current episode manic severe with psychotic features: Secondary | ICD-10-CM | POA: Diagnosis not present

## 2020-12-17 MED ORDER — QUETIAPINE FUMARATE 100 MG PO TABS
100.0000 mg | ORAL_TABLET | Freq: Every day | ORAL | Status: DC
  Administered 2020-12-17 – 2020-12-19 (×3): 100 mg via ORAL
  Filled 2020-12-17 (×5): qty 1

## 2020-12-17 NOTE — Progress Notes (Addendum)
   12/16/20 2100  Psych Admission Type (Psych Patients Only)  Admission Status Voluntary  Psychosocial Assessment  Patient Complaints Anxiety;Depression  Eye Contact Fair  Facial Expression Anxious  Affect Anxious;Appropriate to circumstance  Speech Logical/coherent  Interaction Assertive  Motor Activity Other (Comment) (wnl)  Appearance/Hygiene Unremarkable  Behavior Characteristics Cooperative;Appropriate to situation;Anxious  Mood Depressed;Anxious;Pleasant  Thought Process  Coherency Concrete thinking  Content WDL  Delusions None reported or observed  Perception WDL  Hallucination None reported or observed  Judgment Poor  Confusion None  Danger to Self  Current suicidal ideation? Denies  Danger to Others  Danger to Others None reported or observed   Pt denies SI, HI, AVH and pain. Pt rates anxiety 3/10. Pt says he is good except for the fact that his mother won't let him come home. Pt still has positive outlook and is outgoing with other peers on the unit.

## 2020-12-17 NOTE — Progress Notes (Signed)
   12/17/20 2100  Psych Admission Type (Psych Patients Only)  Admission Status Voluntary  Psychosocial Assessment  Eye Contact Fair  Facial Expression Anxious;Worried  Affect Anxious;Appropriate to circumstance  Speech Logical/coherent  Interaction Assertive  Motor Activity Other (Comment) (WNL)  Appearance/Hygiene Unremarkable  Behavior Characteristics Cooperative;Appropriate to situation  Mood Depressed  Thought Process  Coherency Concrete thinking  Content WDL  Delusions None reported or observed  Perception WDL  Hallucination None reported or observed  Judgment Limited  Confusion None  Danger to Self  Current suicidal ideation? Denies  Danger to Others  Danger to Others None reported or observed

## 2020-12-17 NOTE — Progress Notes (Signed)
[facility] MD Progress Note  12/17/2020 1:54 PM Clinton Fisher  MRN:  [id number]   Subjective: Clinton Fisher reports, "I'm doing well. I'm ready to get out of here. The social worker has asked me for certain to go after discharge. I have agreed to go to the [facility].  My mood is not really bad or really good, just in the middle because of my housing situation. I'm not suicidal, homicidal, hearing voices or seeing things. I'm okay". Daily notes: During this follow-up care evaluation, Clinton Fisher presents stoic & agreeable that he has no choice but to go to the [facility] after discharge.  He remains reasonable & understanding about his mother's decision to not allow him to return to her home after discharge. He currently denies any SIHI, AVH, delusional thoughts or paranoia. He does not appear to be responding to any internal stimuli. However, the attending psychiatrist is currently in the process of titrating down the doses of his Seroquel to discontinuing of this medicine. He denies any side effects. Clinton Fisher is also on Prolixin 10 mg po bid for mood control. He will be discharged on this medication.  Reason for admission:  Clinton Fisher is a 29 year old male with a reported past psychiatric history significant for bipolar disorder; most recently manic, severe with psychotic features who originally presented to the behavioral health urgent care center on 12/08/2020. He was labile, tangential, and the mother was concerned about the patient's safety. She also stated she did not trust him not to hurt her. He was admitted to the hospital for evaluation and stabilization.  (Previous notes) Objective: Medical record reviewed.  Patient's case discussed in detail with members of the treatment team.  Social work and nursing notes are reviewed.  Per social work note, patient will be unable to return home to living with mother after discharge.  I met with and evaluated patient today on the unit of for  follow-up.  The patient reports that he is feeling more anxious.  He states he feels like he is being caged here.  He otherwise feels his mood is improved, and is hoping for discharge soon.  He states that he intends to be compliant with medications after discharge, "I will never forget my medicines."  He states he feels much better with his current medication regimen.  He does continue to endorse vague auditory hallucinations where he feels that his name is being called.  He states he will look to see who is calling him and then realized that it is probably ghosts.  Patient continues with some persecutory delusions of "getting tricked out of my disability because they saw me playing basketball."  He states that he has excruciating knee pain if he has to walk.  He states that he is able to stand and sit to work, and notes that when he was playing basketball he was not running, but instead just standing and throwing baskets.  Patient also states that he is currently unemployed.  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  He states that after he began to refuse to drive the brother, he was fired.  He denies any thoughts of harming others even when feeling wronged.  He denies any suicidal ideation or homicidal ideation.  He is denying any auditory or visual hallucinations today. He reports good appetite, and states  he has been sleeping well.  The patient slept 9.25 hours last night.  No new labs.  Vital signs today: BP (!) 126/93 (BP Location: Right Arm) Comment: rn notified  Pulse 87   Temp 97.9 F (36.6 C) (Oral)   Resp 18   Ht _0  (1.727 m)   Wt 78 kg   SpO2 100%   BMI 26.15 kg/m  The patient has been attending groups intermittently and participating appropriately.  He has been taking standing dose medications as prescribed.  He has been requiring  hydroxyzine as needed for anxiety.  Principal Problem: Bipolar I disorder, current or most recent episode manic, with psychotic features ([place]) Diagnosis: Principal Problem:   Bipolar I disorder, current or most recent episode manic, with psychotic features ([place]) Active Problems:   Cannabis dependence, continuous ([place])   GAD (generalized anxiety disorder)   Tobacco use disorder  Total Time spent with patient: 15 minutes  Past Psychiatric History: See admission H&P  Past Medical History:  Past Medical History:  Diagnosis Date  . Abrasion of index finger 07/29/2013   left  . Bipolar I disorder, current or most recent episode manic, severe ([place]) 12/14/2020  . Bipolar I disorder, current or most recent episode manic, with psychotic features ([place]) 12/14/2020  . 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 GERD (gastroesophageal reflux disease)    no current med.  . Manic affective disorder with recurrent episode ([place])   . Seasonal allergies    sore throat 07/29/2013    Past Surgical History:  Procedure Laterality Date  . FOOT FOREIGN BODY REMOVAL Right 01/19/2011  . KNEE ARTHROSCOPY Right 08/01/2013   Procedure: ARTHROSCOPY RIGHT KNEE, Chondroplasty, Excision of [place];  Surgeon: Alta Corning, MD;  Location: [place];  Service: Orthopedics;  Laterality: Right;   Family History: History reviewed. No pertinent family history.  Family Psychiatric  History: See admission H&P  Social History:  Social History   Substance and Sexual Activity  Alcohol Use Yes   Comment: every 3 days     Social History   Substance and Sexual Activity  Drug Use No    Social History   Socioeconomic History  . Marital status: Single    Spouse name: Not on file  . Number of children: Not on file  . Years of education: Not on file  . Highest education level: Not on file  Occupational History  . Not on file  Tobacco Use  . Smoking status: Current Every Day Smoker  . Smokeless  tobacco: Never Used  Substance and Sexual Activity  . Alcohol use: Yes    Comment: every 3 days  . Drug use: No  . Sexual activity: Not on file  Other Topics Concern  . Not on file  Social History Narrative  . Not on file   Social Determinants of Health   Financial Resource Strain: Not on file  Food Insecurity: Not on file  Transportation Needs: Not on file  Physical Activity: Not on file  Stress: Not on file  Social Connections: Not on file   Additional Social History:   Sleep: Good  Appetite:  Good  Current Medications: Current Facility-Administered Medications  Medication Dose Route Frequency Provider Last Rate Last Admin  . acetaminophen (TYLENOL) tablet 650 mg  650 mg Oral Q6H PRN White, Patrice L, NP   650 mg at 12/17/20 1344  . alum & mag hydroxide-simeth (MAALOX/MYLANTA) 200-200-20 MG/5ML suspension 30 mL  30 mL Oral Q4H  PRN Darrol Angel L, NP   30 mL at 12/13/20 1809  . fluPHENAZine (PROLIXIN) tablet 10 mg  10 mg Oral BID Sharma Covert, MD   10 mg at 12/17/20 0932  . hydrOXYzine (ATARAX/VISTARIL) tablet 25 mg  25 mg Oral TID PRN White, Patrice L, NP   25 mg at 12/17/20 1344  . lithium carbonate (LITHOBID) CR tablet 300 mg  300 mg Oral QHS Sharma Covert, MD   300 mg at 12/16/20 2108  . magnesium hydroxide (MILK OF MAGNESIA) suspension 30 mL  30 mL Oral Daily PRN White, Patrice L, NP      . nicotine (NICODERM CQ - dosed in mg/24 hours) patch 21 mg  21 mg Transdermal Daily Sharma Covert, MD   21 mg at 12/16/20 0803  . OLANZapine zydis (ZYPREXA) disintegrating tablet 10 mg  10 mg Oral Q8H PRN Sharma Covert, MD      . QUEtiapine (SEROQUEL) tablet 100 mg  100 mg Oral QHS Arthor Captain, MD       Lab Results: No results found for this or any previous visit (from the past 48 hour(s)).  Blood Alcohol level:  Lab Results  Component Value Date   ETH <10 35/57/3220   Metabolic Disorder Labs: Lab Results  Component Value Date   HGBA1C 5.3 12/10/2020    MPG 105.41 12/10/2020   No results found for: PROLACTIN Lab Results  Component Value Date   CHOL 139 12/10/2020   TRIG 93 12/10/2020   HDL 41 12/10/2020   CHOLHDL 3.4 12/10/2020   VLDL 19 12/10/2020   LDLCALC 79 12/10/2020   Physical Findings: AIMS: Facial and Oral Movements Muscles of Facial Expression: None, normal Lips and Perioral Area: None, normal Jaw: None, normal Tongue: None, normal,Extremity Movements Upper (arms, wrists, hands, fingers): None, normal Lower (legs, knees, ankles, toes): None, normal, Trunk Movements Neck, shoulders, hips: None, normal, Overall Severity Severity of abnormal movements (highest score from questions above): None, normal Incapacitation due to abnormal movements: None, normal Patient's awareness of abnormal movements (rate only patient's report): No Awareness, Dental Status Current problems with teeth and/or dentures?: No Does patient usually wear dentures?: No  CIWA:    COWS:     Musculoskeletal: Strength & Muscle Tone: within normal limits Gait & Station: normal Patient leans: N/A  Psychiatric Specialty Exam:  Presentation  General Appearance: Casual; Fairly Groomed  Eye Contact:Good  Speech:Clear and Coherent  Speech Volume:Normal  Handedness:Right  Mood and Affect  Mood:Anxious; Dysphoric  Affect:Appropriate  Thought Process  Thought Processes:Goal Directed  Descriptions of Associations:Tangential  Orientation:Partial (knows month, year, presidents)  Thought Content:Logical  History of Schizophrenia/Schizoaffective disorder:No  Duration of Psychotic Symptoms:Greater than six months  Hallucinations:No data recorded  Ideas of Reference:None  Suicidal Thoughts:No data recorded  Homicidal Thoughts:No data recorded  Sensorium  Memory:Immediate Good; Recent Fair; Remote Fair  Judgment:Fair  Insight:Shallow  Executive Functions  Concentration:Good  Attention Span:Good  [place] of  Knowledge:Good  Language:Good  Psychomotor Activity  Psychomotor Activity:No data recorded  Assets  Assets:Communication Skills; Desire for Improvement; Resilience  Sleep  Sleep:No data recorded  Physical Exam: Physical Exam Vitals and nursing note reviewed.  HENT:     Head: Normocephalic and atraumatic.     Nose: Nose normal.     Mouth/Throat:     Pharynx: Oropharynx is clear.  Eyes:     Pupils: Pupils are equal, round, and reactive to light.  Cardiovascular:     Rate and Rhythm:  Normal rate.     Pulses: Normal pulses.  Pulmonary:     Effort: Pulmonary effort is normal.  Genitourinary:    Comments: Deferred Musculoskeletal:        General: Normal range of motion.     Cervical back: Normal range of motion.  Skin:    General: Skin is warm and dry.  Neurological:     General: No focal deficit present.     Mental Status: He is alert and oriented to person, place, and time.    Review of Systems  Constitutional: Negative.   HENT: Negative.   Eyes: Negative.   Respiratory: Negative.   Cardiovascular: Negative.   Gastrointestinal: Negative.   Genitourinary: Negative.   Musculoskeletal: Negative.  Joint pain: knee.  Skin: Negative.   Neurological: Negative for dizziness, tingling, tremors, sensory change, speech change, focal weakness, seizures, loss of consciousness, weakness and headaches.  Endo/Heme/Allergies:       Allergies: Augmentin, Trazodone  Psychiatric/Behavioral: Positive for hallucinations (Stable). Negative for depression, memory loss, substance abuse and suicidal ideas. The patient is nervous/anxious (Stable). The patient does not have insomnia.    Blood pressure (!) 126/93, pulse 87, temperature 97.9 F (36.6 C), temperature source Oral, resp. rate 18, height _0  (1.727 m), weight 78 kg, SpO2 100 %. Body mass index is 26.15 kg/m.  Treatment Plan Summary: Daily contact with patient to assess and evaluate symptoms and progress in treatment and  Medication management.   Continue inpatient hospitalization.  Will continue today 12/17/2020 plan as below except where it is noted.  Diagnosis: 1.  Bipolar disorder, most recently manic, severe with psychotic features 2.  History of polysubstance use disorders. 3.  Cannabis use disorder versus dependence.  Pertinent findings on examination today: 1.    No irritability is present today, however he is aware & has been notified of mother not allowing him home and need for new discharge planning. 2.   He endorses denies both hallucinations & visual hallucinations during today's interview 3.    Patient has been taking medications as prescribed, and requiring as needed hydroxyzine.  Plan: 1. Continue Tegretol 100 mg p.o. twice daily which was prescribed for mood stabilization.  The plan is to eventually discontinue Tegretol.   2. Continue Prolixin 10 mg p.o. twice daily for psychosis. 3. Continue hydroxyzine 25 mg p.o. 3 times daily as needed anxiety. 4. Decreased Seroquel from 200 mg to Seroquel 100 mg p.o. nightly for mood stabilization & psychotic symptoms. The plan is for eventual discontinuation of Seroquel. 5.  Continue lithium carbonate (Lithobid) 300 mg p.o. nightly for mood stabilization. 6.  Disposition planning-in progress.  Appreciate social work assistance in helping patient create a new discharge plan.  Lindell Spar, NP, pmhnp, fnp-bc 12/17/2020, 1:54 PMPatient ID: Clinton Fisher, male   DOB: March 22, 1991, 30 y.o.   MRN: [id number] Patient ID: Clinton Fisher, male   DOB: 1991/06/01, 30 y.o.   MRN: [id number]

## 2020-12-17 NOTE — Progress Notes (Signed)
Adult Psychoeducational Group Note  Date:  12/17/2020 Time:  11:51 PM  Group Topic/Focus:  Wrap-Up Group:   The focus of this group is to help patients review their daily goal of treatment and discuss progress on daily workbooks.  Participation Level:  Did Not Attend  Participation Quality:  Did Not Attend  Affect:  Did Not Attend  Cognitive:  Did Not Attend  Insight: None  Engagement in Group:  Did Not Attend  Modes of Intervention:  Did Not Attend  Additional Comments:  Pt did not attend evening wrap up group tonight.  [name] 12/17/2020, 11:51 PM

## 2020-12-17 NOTE — [facility] Group Notes (Signed)
  Patient did not attend group.  SPIRITUALITY GROUP NOTE   Spirituality group facilitated by Chaplain Katy Yanna Leaks, BCC.   Group Description: Group focused on topic of hope. Patients participated in facilitated discussion around topic, connecting with one another around experiences and definitions for hope. Group members engaged with visual explorer photos, reflecting on what hope looks like for them today. Group engaged in discussion around how their definitions of hope are present today in hospital.   Modalities: Psycho-social ed, Adlerian, Narrative, MI    

## 2020-12-17 NOTE — [facility] Group Notes (Signed)
Type of Therapy and Topic: Group Therapy: Gratitude  Description of Group: The purpose behind this group is to get people thinking about things for which  they can be grateful. If continued over time, they might begin to spontaneously look for things and  situations for which to be grateful. Gratitude is related to a "wide variety of forms of wellbeing , whereas "negative attributions" can adversely affect relationships.  Several studies have shown that interventions to  increase gratitude can impact areas such as overall life satisfaction, decreased negative affect, increased happiness, the ability to provide emotional support to others, and decreased worrying.   Therapeutic Goals: 1. Patient will learn activities that focus on gratitude in their daily lives. 2. Patient will share gratitude in their daily lives. 3. Patient will learn to develop healthy habits and positive thinking techniques. 4. Patient will receive support and feedback from others  Therapeutic Modalities: Cognitive Behavioral Therapy Solution Focused Therapy Motivational Interviewing   

## 2020-12-17 NOTE — Progress Notes (Signed)
Recreation Therapy Notes  Date: 5.27.22 Time: 1005 Location: [address] Dayroom  Group Topic: Anxiety  Goal Area(s) Addresses:  Patient will identify what anxiety is. Patient will identify what can cause anxiety. Patient will identify ways to cope with anxiety post d/c.  Intervention: Worksheet    Activity: Introduction to Anxiety.  Patients were to identify things that trigger their anxiety and they physical symptoms of anxiety.  Patients were to also thoughts that occur when anxious and ways to cope with anxiety.  Education:  Anxiety, Discharge Planning  Education Outcome: Acknowledges understanding/In group clarification offered/Needs additional education.   Clinical Observations/Feedback: Pt did not attend group.     Caroll Rancher, LRT/CTRS         Caroll Rancher A 12/17/2020 11:42 AM

## 2020-12-17 NOTE — Progress Notes (Signed)
Pt received PRN Tylenol for c/o of lower back pain 6/10 and Vistaril for anxiety 9/10 "Just about place to stay" at 1344. Observed with verbal outburst X 1 earlier this shift "I'm just tired of being told I'm leaving for 3 days now and it don't happen. I need to get the right information about that, I get claustrophobic being in here especially when I know I need to be out there". Presented with flat affect, anxious mood and pressured speech when assessed. Reports he slept well with good appetite, denies SI, HI and AVH  "Everything is fine, I just need to know about d/c". Admitted to being verbally abusive towards his mother in the past "I know I have called my mother names and said rude things to her, I do understand her decisions". Pt shaved this afternoon with MHT assistance. He also showered and changed his clothing. Observed to be calmer at 1340 when reassessed. Rates his pain 0/10 when reassessed.  Emotional support and encouragement offered to pt. Safety checks maintained at Q 15 minutes intervals. All medications given as ordered with verbal education and effects monitored.  Pt tolerates all medications and meals well without discomfort. Refused morning and noon group despite multiple prompts. Remains safe on and off unit.

## 2020-12-18 DIAGNOSIS — F312 Bipolar disorder, current episode manic severe with psychotic features: Secondary | ICD-10-CM | POA: Diagnosis not present

## 2020-12-18 MED ORDER — ZIPRASIDONE MESYLATE 20 MG IM SOLR: 20.0000 mg | Freq: Two times a day (BID) | INTRAMUSCULAR | Status: DC | PRN

## 2020-12-18 MED ORDER — LORAZEPAM 1 MG PO TABS: 1.0000 mg | ORAL_TABLET | ORAL | Status: DC | PRN

## 2020-12-18 MED ORDER — LORAZEPAM 2 MG/ML IJ SOLN: 1.0000 mg | Freq: Four times a day (QID) | INTRAMUSCULAR | Status: DC | PRN

## 2020-12-18 NOTE — Progress Notes (Signed)
Pt received PRN Vistaril for increased anxiety 8/10 related to "Just not going home today again. I just want to get out, touch some grass. Get out of here in peace, quietly and not be trouble to anyone. I'm ecstatic to get out of here. I was told today finally". Denies SI,HI, AVH and pain when assessed. Pt A & O X3. Presents with blunted affect and slight irritability on initial encounter but is appropriate / pleasant on interactions. Visible scheduled group, actively engaged in activities.  Pt encouraged by writer to exercise patience in in reference to d/c due to placement issues. All medications given as ordered with verbal education and effects monitored. Emotional support offered to pt throughout this shift. Safety checks continues at Q 15 minutes intervals without self harm gestures or outburst. Pt verbalized understanding related to discharge constraint "Well, I guess I will be patient then". Tolerates meals and medications well without discomfort. Remains safe on and off unit.

## 2020-12-18 NOTE — Progress Notes (Signed)
Adult Psychoeducational Group Note  Date:  12/18/2020 Time:  10:35 PM  Group Topic/Focus:  Wrap-Up Group:   The focus of this group is to help patients review their daily goal of treatment and discuss progress on daily workbooks.  Participation Level:  Did Not Attend  Participation Quality:  Did Not Attend  Affect:  Did Not Attend  Cognitive:  Did Not Attend  Insight: None  Engagement in Group:  Did Not Attend  Modes of Intervention:  Did Not Attend  Additional Comments:  Pt did not attend evening wrap up group tonight.  [name] 12/18/2020, 10:35 PM

## 2020-12-18 NOTE — Progress Notes (Signed)
[facility] MD Progress Note  12/18/2020 9:57 AM RAVON MORTELLARO  MRN:  [id number]   Subjective: Clinton Fisher reports, "I don't need to go to a group home. My momma and Daddy say I need to go to a group home but I need to be with family. I will got to therapy and take periodic drug tests, I just want to go home to my Momma's house."   Reason for admission:  Dmauri Rosenow is a 30 year old male with a reported past psychiatric history significant for bipolar disorder; most recently manic, severe with psychotic features who originally presented to the behavioral health urgent care center on 12/08/2020. He was labile, tangential, and the mother was concerned about the patient's safety. She also stated she did not trust him not to hurt her. He was admitted to the hospital for evaluation and stabilization.  Daily notes: Patient is seen and evaluated in his room today. He was visualized on the telephone prior to his assessment today. He was tearful and upset. He is tearful during the assessment and stated he does not want to go to a group home and only wants to go home to his Momma's house. He is ruminating on his use of Delta * and the "psychoticness" it did to his brain. He stated he will take periodic drug tests and go to therapy if he can just go home.  He currently denies any SIHI, VH, delusional thoughts or paranoia. He stated he is hearing some faint voices but is fearful that saying that will keep him here longer. Discussed with patient this could be residual effects of not using Delta 8.  He does not appear to be responding to any internal stimuli. Patient is currently in the process of down- titration of his Seroquel to discontinuing of this medicine. He denies any medication side effects. Everard is also on Prolixin 10 mg po bid for mood control. He will be discharged on this medication. He reported he is sleeping well, he slept 5.75 hours last night. He reported a good appetite. Will continue to monitor his mood  during the down-titration of Seroquel.    Principal Problem: Bipolar I disorder, current or most recent episode manic, with psychotic features (HCC) Diagnosis: Principal Problem:   Bipolar I disorder, current or most recent episode manic, with psychotic features (HCC) Active Problems:   Cannabis dependence, continuous (HCC)   GAD (generalized anxiety disorder)   Tobacco use disorder  Total Time spent with patient: 15 minutes  Past Psychiatric History: See admission H&P  Past Medical History:  Past Medical History:  Diagnosis Date  . Abrasion of index finger 07/29/2013   left  . Bipolar I disorder, current or most recent episode manic, severe (HCC) 12/14/2020  . Bipolar I disorder, current or most recent episode manic, with psychotic features (HCC) 12/14/2020  . 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 GERD (gastroesophageal reflux disease)    no current med.  . Manic affective disorder with recurrent episode (HCC)   . Seasonal allergies    sore throat 07/29/2013    Past Surgical History:  Procedure Laterality Date  . FOOT FOREIGN BODY REMOVAL Right 01/19/2011  . KNEE ARTHROSCOPY Right 08/01/2013   Procedure: ARTHROSCOPY RIGHT KNEE, Chondroplasty, Excision of Plica;  Surgeon: Harvie Junior, MD;  Location: [facility];  Service: Orthopedics;  Laterality: Right;   Family History: History reviewed. No pertinent family history.  Family Psychiatric  History: See admission H&P  Social History:  Social  History   Substance and Sexual Activity  Alcohol Use Yes   Comment: every 3 days     Social History   Substance and Sexual Activity  Drug Use No    Social History   Socioeconomic History  . Marital status: Single    Spouse name: Not on file  . Number of children: Not on file  . Years of education: Not on file  . Highest education level: Not on file  Occupational History  . Not on file  Tobacco Use  . Smoking status: Current Every Day Smoker  .  Smokeless tobacco: Never Used  Substance and Sexual Activity  . Alcohol use: Yes    Comment: every 3 days  . Drug use: No  . Sexual activity: Not on file  Other Topics Concern  . Not on file  Social History Narrative  . Not on file   Social Determinants of Health   Financial Resource Strain: Not on file  Food Insecurity: Not on file  Transportation Needs: Not on file  Physical Activity: Not on file  Stress: Not on file  Social Connections: Not on file   Additional Social History:   Sleep: Good  Appetite:  Good  Current Medications: Current Facility-Administered Medications  Medication Dose Route Frequency Provider Last Rate Last Admin  . acetaminophen (TYLENOL) tablet 650 mg  650 mg Oral Q6H PRN White, Patrice L, NP   650 mg at 12/17/20 2022  . alum & mag hydroxide-simeth (MAALOX/MYLANTA) 200-200-20 MG/5ML suspension 30 mL  30 mL Oral Q4H PRN White, Patrice L, NP   30 mL at 12/13/20 1809  . fluPHENAZine (PROLIXIN) tablet 10 mg  10 mg Oral BID Antonieta Pertlary, Greg Lawson, MD   10 mg at 12/18/20 [id number]  . hydrOXYzine (ATARAX/VISTARIL) tablet 25 mg  25 mg Oral TID PRN White, Patrice L, NP   25 mg at 12/17/20 1344  . lithium carbonate (LITHOBID) CR tablet 300 mg  300 mg Oral QHS Antonieta Pertlary, Greg Lawson, MD   300 mg at 12/17/20 2017  . LORazepam (ATIVAN) injection 1 mg  1 mg Intramuscular Q6H PRN Mason JimSingleton, Amy E, MD      . ziprasidone (GEODON) injection 20 mg  20 mg Intramuscular Q12H PRN Comer LocketSingleton, Amy E, MD       And  . LORazepam (ATIVAN) tablet 1 mg  1 mg Oral PRN Mason JimSingleton, Amy E, MD      . magnesium hydroxide (MILK OF MAGNESIA) suspension 30 mL  30 mL Oral Daily PRN White, Patrice L, NP      . nicotine (NICODERM CQ - dosed in mg/24 hours) patch 21 mg  21 mg Transdermal Daily Antonieta Pertlary, Greg Lawson, MD   21 mg at 12/18/20 0727  . OLANZapine zydis (ZYPREXA) disintegrating tablet 10 mg  10 mg Oral Q8H PRN Antonieta Pertlary, Greg Lawson, MD      . QUEtiapine (SEROQUEL) tablet 100 mg  100 mg Oral QHS Claudie ReveringJames,  Martha L, MD   100 mg at 12/17/20 2016   Lab Results: No results found for this or any previous visit (from the past 48 hour(s)).  Blood Alcohol level:  Lab Results  Component Value Date   ETH <10 12/08/2020   Metabolic Disorder Labs: Lab Results  Component Value Date   HGBA1C 5.3 12/10/2020   MPG 105.41 12/10/2020   No results found for: PROLACTIN Lab Results  Component Value Date   CHOL 139 12/10/2020   TRIG 93 12/10/2020   HDL 41 12/10/2020   CHOLHDL  3.4 12/10/2020   VLDL 19 12/10/2020   LDLCALC 79 12/10/2020   Physical Findings: AIMS: Facial and Oral Movements Muscles of Facial Expression: None, normal Lips and Perioral Area: None, normal Jaw: None, normal Tongue: None, normal,Extremity Movements Upper (arms, wrists, hands, fingers): None, normal Lower (legs, knees, ankles, toes): None, normal, Trunk Movements Neck, shoulders, hips: None, normal, Overall Severity Severity of abnormal movements (highest score from questions above): None, normal Incapacitation due to abnormal movements: None, normal Patient's awareness of abnormal movements (rate only patient's report): No Awareness, Dental Status Current problems with teeth and/or dentures?: No Does patient usually wear dentures?: No  CIWA:    COWS:     Musculoskeletal: Strength & Muscle Tone: within normal limits Gait & Station: normal Patient leans: N/A  Psychiatric Specialty Exam:  Presentation  General Appearance: Casual; Fairly Groomed  Eye Contact:Good  Speech:Clear and Coherent  Speech Volume:Normal  Handedness:Right  Mood and Affect  Mood:Anxious; Dysphoric  Affect:Appropriate  Thought Process  Thought Processes:Goal Directed  Descriptions of Associations:Tangential  Orientation:Partial (knows month, year, presidents)  Thought Content:Logical  History of Schizophrenia/Schizoaffective disorder:No  Duration of Psychotic Symptoms:Greater than six months  Hallucinations:No data  recorded  Ideas of Reference:None  Suicidal Thoughts:No data recorded  Homicidal Thoughts:No data recorded  Sensorium  Memory:Immediate Good; Recent Fair; Remote Fair  Judgment:Fair  Insight:Shallow  Executive Functions  Concentration:Good  Attention Span:Good  Recall:Fair  Fund of Knowledge:Good  Language:Good  Psychomotor Activity  Psychomotor Activity:No data recorded  Assets  Assets:Communication Skills; Desire for Improvement; Resilience  Sleep  Sleep:No data recorded  Physical Exam: Physical Exam Vitals and nursing note reviewed.  HENT:     Head: Normocephalic and atraumatic.     Nose: Nose normal.     Mouth/Throat:     Pharynx: Oropharynx is clear.  Eyes:     Pupils: Pupils are equal, round, and reactive to light.  Cardiovascular:     Rate and Rhythm: Normal rate.     Pulses: Normal pulses.  Pulmonary:     Effort: Pulmonary effort is normal.  Genitourinary:    Comments: Deferred Musculoskeletal:        General: Normal range of motion.     Cervical back: Normal range of motion.  Skin:    General: Skin is warm and dry.  Neurological:     General: No focal deficit present.     Mental Status: He is alert and oriented to person, place, and time.    Review of Systems  Constitutional: Negative.   HENT: Negative.   Eyes: Negative.   Respiratory: Negative.   Cardiovascular: Negative.   Gastrointestinal: Negative.   Genitourinary: Negative.   Musculoskeletal: Negative.  Joint pain: knee.  Skin: Negative.   Neurological: Negative for dizziness, tingling, tremors, sensory change, speech change, focal weakness, seizures, loss of consciousness, weakness and headaches.  Endo/Heme/Allergies:       Allergies: Augmentin, Trazodone  Psychiatric/Behavioral: Positive for hallucinations (Stable). Negative for depression, memory loss, substance abuse and suicidal ideas. The patient is nervous/anxious (Stable). The patient does not have insomnia.    Blood  pressure (!) 126/93, pulse 87, temperature 97.9 F (36.6 C), temperature source Oral, resp. rate 18, height 5\' 8"  (1.727 m), weight 78 kg, SpO2 100 %. Body mass index is 26.15 kg/m.  Treatment Plan Summary: Daily contact with patient to assess and evaluate symptoms and progress in treatment and Medication management.   Continue inpatient hospitalization.  Will continue today 12/18/2020 plan as below except where it is noted.  Diagnosis: 1.  Bipolar disorder, most recently manic, severe with psychotic features 2.  History of polysubstance use disorders. 3.  Cannabis use disorder versus dependence.  Pertinent findings on examination today: 1.    No irritability is present today, however he is aware & has been notified of mother not allowing him home and need for new discharge planning. 2.   He endorses denies both hallucinations & visual hallucinations during today's interview 3.    Patient has been taking medications as prescribed, and requiring as needed hydroxyzine.  Plan: 1. Continue Tegretol 100 mg p.o. twice daily which was prescribed for mood stabilization.  The plan is to eventually discontinue Tegretol.   2. Continue Prolixin 10 mg p.o. twice daily for psychosis. 3. Continue hydroxyzine 25 mg p.o. 3 times daily as needed anxiety. 4. Continue Seroquel 100 mg p.o. nightly for mood stabilization & psychotic symptoms. The plan is for eventual discontinuation of Seroquel. Monitor for mood instability with decrease.  5.  Continue lithium carbonate (Lithobid) 300 mg p.o. nightly for mood stabilization. 6.  Disposition planning-in progress.  Appreciate social work assistance in helping patient create a new discharge plan.  Laveda Abbe, NP 12/18/2020, 9:57 AM

## 2020-12-18 NOTE — [facility] Group Notes (Signed)
  [facility] LCSW Group Therapy Note  Date/Time:  12/18/2020 11:15AM-12:00PM  Type of Therapy and Topic:  Group Therapy:  Feelings About Hospitalization  Participation Level:  Active   Description of Group This process group involved patients discussing their feelings related to being hospitalized, as well as the benefits they see to being in the hospital.  These feelings and benefits were itemized.  The group then brainstormed specific ways in which they could seek those same benefits when they discharge and return home.  Therapeutic Goals Patient will identify and describe positive and negative feelings related to hospitalization Patient will verbalize benefits of hospitalization to themselves personally Patients will brainstorm together ways they can obtain similar benefits in the outpatient setting, identify barriers to wellness and possible solutions  Summary of Patient Progress:  The patient expressed his primary feelings about being hospitalized are that he originally wanted help reigning in his emotions, and it "kind of helped" but now he feels he has been put in a corner and needs more outside time.  He reported in a very calm voice that it is everything in his power to not lash out.  He really misses having his cell phone as a coping technique.  He stated at discharge he plans to keep taking his pills in order to "not get so bad I have to come back."  He reported the hospital's approach as being a "little heavy-handed" and was actually quite calm and demonstrated insight during this group.  Therapeutic Modalities Cognitive Behavioral Therapy Motivational Interviewing    Ambrose Mantle, LCSW 12/18/2020, 9:55 AM

## 2020-12-19 DIAGNOSIS — F312 Bipolar disorder, current episode manic severe with psychotic features: Secondary | ICD-10-CM | POA: Diagnosis not present

## 2020-12-19 LAB — LITHIUM LEVEL: Lithium Lvl: 0.11 mmol/L — ABNORMAL LOW (ref 0.60–1.20)

## 2020-12-19 NOTE — Progress Notes (Signed)
[facility] MD Progress Note  12/19/2020 11:29 AM Clinton Fisher  MRN:  [id number]   Subjective: Clinton Fisher reports, "I don't need to go to a group home. My momma and Daddy say I need to go to a group home but I need to be with family. I will got to therapy and take periodic drug tests, I just want to go home to my Momma's house."   Reason for admission:  Clinton Fisher is a 30 year old male with a reported past psychiatric history significant for bipolar disorder; most recently manic, severe with psychotic features who originally presented to the behavioral health urgent care center on 12/08/2020. He was labile, tangential, and the mother was concerned about the patient's safety. She also stated she did not trust him not to hurt her. He was admitted to the hospital for evaluation and stabilization.  Daily notes: Patient is seen and evaluated in his room today. He has moved his mattress to the floor and stated "this is the only place I can get any sleep." His mood is irritable and he is angry that he is still in the hospital. His speech is rapid. He goes on and on about being kept in the hospital and how he "will never get out of this damn place." He does not mention anything about going to his mother's versus a "group home" today. He denies physical pain. He is taking his medications and has no issues with them.  He currently denies any SI/HI, VH, delusional thoughts or paranoia. He denies auditory hallucinations today.  He does not appear to be responding to any internal stimuli. Patient is currently in the process of down- titration of his Seroquel and eventually discontinuing this medicine.Clinton Fisher is also on Prolixin 10 mg po bid for mood control. He will be discharged on this medication. He reported he is sleeping well, he slept 6.75 hours last night. He reported a good appetite. Will continue to monitor his mood during the down-titration of Seroquel.    Principal Problem: Bipolar I disorder, current or most  recent episode manic, with psychotic features (HCC) Diagnosis: Principal Problem:   Bipolar I disorder, current or most recent episode manic, with psychotic features (HCC) Active Problems:   Cannabis dependence, continuous (HCC)   GAD (generalized anxiety disorder)   Tobacco use disorder  Total Time spent with patient: 15 minutes  Past Psychiatric History: See admission H&P  Past Medical History:  Past Medical History:  Diagnosis Date  . Abrasion of index finger 07/29/2013   left  . Bipolar I disorder, current or most recent episode manic, severe (HCC) 12/14/2020  . Bipolar I disorder, current or most recent episode manic, with psychotic features (HCC) 12/14/2020  . 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 GERD (gastroesophageal reflux disease)    no current med.  . Manic affective disorder with recurrent episode (HCC)   . Seasonal allergies    sore throat 07/29/2013    Past Surgical History:  Procedure Laterality Date  . FOOT FOREIGN BODY REMOVAL Right 01/19/2011  . KNEE ARTHROSCOPY Right 08/01/2013   Procedure: ARTHROSCOPY RIGHT KNEE, Chondroplasty, Excision of Plica;  Surgeon: Harvie Junior, MD;  Location: [facility];  Service: Orthopedics;  Laterality: Right;   Family History: History reviewed. No pertinent family history.  Family Psychiatric  History: See admission H&P  Social History:  Social History   Substance and Sexual Activity  Alcohol Use Yes   Comment: every 3 days     Social  History   Substance and Sexual Activity  Drug Use No    Social History   Socioeconomic History  . Marital status: Single    Spouse name: Not on file  . Number of children: Not on file  . Years of education: Not on file  . Highest education level: Not on file  Occupational History  . Not on file  Tobacco Use  . Smoking status: Current Every Day Smoker  . Smokeless tobacco: Never Used  Substance and Sexual Activity  . Alcohol use: Yes    Comment: every 3  days  . Drug use: No  . Sexual activity: Not on file  Other Topics Concern  . Not on file  Social History Narrative  . Not on file   Social Determinants of Health   Financial Resource Strain: Not on file  Food Insecurity: Not on file  Transportation Needs: Not on file  Physical Activity: Not on file  Stress: Not on file  Social Connections: Not on file   Additional Social History:   Sleep: Good  Appetite:  Good  Current Medications: Current Facility-Administered Medications  Medication Dose Route Frequency Provider Last Rate Last Admin  . acetaminophen (TYLENOL) tablet 650 mg  650 mg Oral Q6H PRN White, Patrice L, NP   650 mg at 12/18/20 2049  . alum & mag hydroxide-simeth (MAALOX/MYLANTA) 200-200-20 MG/5ML suspension 30 mL  30 mL Oral Q4H PRN White, Patrice L, NP   30 mL at 12/13/20 1809  . fluPHENAZine (PROLIXIN) tablet 10 mg  10 mg Oral BID Antonieta Pert, MD   10 mg at 12/19/20 4098  . hydrOXYzine (ATARAX/VISTARIL) tablet 25 mg  25 mg Oral TID PRN White, Patrice L, NP   25 mg at 12/18/20 1356  . lithium carbonate (LITHOBID) CR tablet 300 mg  300 mg Oral QHS Antonieta Pert, MD   300 mg at 12/18/20 2049  . LORazepam (ATIVAN) injection 1 mg  1 mg Intramuscular Q6H PRN Mason Jim, Amy E, MD      . ziprasidone (GEODON) injection 20 mg  20 mg Intramuscular Q12H PRN Comer Locket, MD       And  . LORazepam (ATIVAN) tablet 1 mg  1 mg Oral PRN Mason Jim, Amy E, MD      . magnesium hydroxide (MILK OF MAGNESIA) suspension 30 mL  30 mL Oral Daily PRN White, Patrice L, NP      . nicotine (NICODERM CQ - dosed in mg/24 hours) patch 21 mg  21 mg Transdermal Daily Antonieta Pert, MD   21 mg at 12/18/20 0727  . OLANZapine zydis (ZYPREXA) disintegrating tablet 10 mg  10 mg Oral Q8H PRN Antonieta Pert, MD   10 mg at 12/19/20 0742  . QUEtiapine (SEROQUEL) tablet 100 mg  100 mg Oral QHS Claudie Revering, MD   100 mg at 12/18/20 2049   Lab Results: No results found for this or  any previous visit (from the past 48 hour(s)).  Blood Alcohol level:  Lab Results  Component Value Date   ETH <10 12/08/2020   Metabolic Disorder Labs: Lab Results  Component Value Date   HGBA1C 5.3 12/10/2020   MPG 105.41 12/10/2020   No results found for: PROLACTIN Lab Results  Component Value Date   CHOL 139 12/10/2020   TRIG 93 12/10/2020   HDL 41 12/10/2020   CHOLHDL 3.4 12/10/2020   VLDL 19 12/10/2020   LDLCALC 79 12/10/2020   Physical Findings: AIMS: Facial and  Oral Movements Muscles of Facial Expression: None, normal Lips and Perioral Area: None, normal Jaw: None, normal Tongue: None, normal,Extremity Movements Upper (arms, wrists, hands, fingers): None, normal Lower (legs, knees, ankles, toes): None, normal, Trunk Movements Neck, shoulders, hips: None, normal, Overall Severity Severity of abnormal movements (highest score from questions above): None, normal Incapacitation due to abnormal movements: None, normal Patient's awareness of abnormal movements (rate only patient's report): No Awareness, Dental Status Current problems with teeth and/or dentures?: No Does patient usually wear dentures?: No  CIWA:    COWS:     Musculoskeletal: Strength & Muscle Tone: within normal limits Gait & Station: normal Patient leans: N/A  Psychiatric Specialty Exam:  Presentation  General Appearance: Casual; Fairly Groomed  Eye Contact:Good  Speech:Clear and Coherent  Speech Volume:Normal  Handedness:Right  Mood and Affect  Mood:Anxious; Depressed  Affect:Tearful; Congruent  Thought Process  Thought Processes:Goal Directed  Descriptions of Associations:Tangential  Orientation:Full (Time, Place and Person) (knows month, year, presidents)  Thought Content:Logical  History of Schizophrenia/Schizoaffective disorder:No  Duration of Psychotic Symptoms:Greater than six months  Hallucinations:No data recorded  Ideas of Reference:None  Suicidal Thoughts:No  data recorded  Homicidal Thoughts:No data recorded  Sensorium  Memory:Immediate Good; Recent Fair; Remote Fair  Judgment:Fair  Insight:Shallow  Executive Functions  Concentration:Good  Attention Span:Good  Recall:Fair  Fund of Knowledge:Good  Language:Good  Psychomotor Activity  Psychomotor Activity:No data recorded  Assets  Assets:Communication Skills; Desire for Improvement; Resilience  Sleep  Sleep:No data recorded  Physical Exam: Physical Exam Vitals and nursing note reviewed.  HENT:     Head: Normocephalic and atraumatic.     Nose: Nose normal.     Mouth/Throat:     Pharynx: Oropharynx is clear.  Eyes:     Pupils: Pupils are equal, round, and reactive to light.  Cardiovascular:     Rate and Rhythm: Normal rate.     Pulses: Normal pulses.  Pulmonary:     Effort: Pulmonary effort is normal.  Genitourinary:    Comments: Deferred Musculoskeletal:        General: Normal range of motion.     Cervical back: Normal range of motion.  Skin:    General: Skin is warm and dry.  Neurological:     General: No focal deficit present.     Mental Status: He is alert and oriented to person, place, and time.    Review of Systems  Constitutional: Negative.   HENT: Negative.   Eyes: Negative.   Respiratory: Negative.   Cardiovascular: Negative.   Gastrointestinal: Negative.   Genitourinary: Negative.   Musculoskeletal: Negative.  Joint pain: knee.  Skin: Negative.   Neurological: Negative for dizziness, tingling, tremors, sensory change, speech change, focal weakness, seizures, loss of consciousness, weakness and headaches.  Endo/Heme/Allergies:       Allergies: Augmentin, Trazodone  Psychiatric/Behavioral: Positive for hallucinations (Stable). Negative for depression, memory loss, substance abuse and suicidal ideas. The patient is nervous/anxious (Stable). The patient does not have insomnia.    Blood pressure 124/86, pulse 90, temperature 98 F (36.7 C),  temperature source Oral, resp. rate 16, height 5\' 8"  (1.727 m), weight 78 kg, SpO2 100 %. Body mass index is 26.15 kg/m.  Treatment Plan Summary: Daily contact with patient to assess and evaluate symptoms and progress in treatment and Medication management.   Continue inpatient hospitalization.  Will continue today 12/19/2020 plan as below except where it is noted.  Diagnosis: 1.  Bipolar disorder, most recently manic, severe with psychotic features 2.  History of polysubstance use disorders. 3.  Cannabis use disorder versus dependence.  Pertinent findings on examination today: 1.    No irritability is present today, however he is aware & has been notified of mother not allowing him home and need for new discharge planning. 2.   He endorses denies both hallucinations & visual hallucinations during today's interview 3.    Patient has been taking medications as prescribed, and requiring as needed hydroxyzine.  Plan: 1. Continue Tegretol 100 mg p.o. twice daily which was prescribed for mood stabilization.  The plan is to eventually discontinue Tegretol.   2. Continue Prolixin 10 mg p.o. twice daily for psychosis. 3. Continue hydroxyzine 25 mg p.o. 3 times daily as needed anxiety. 4. Continue Seroquel 100 mg p.o. nightly for mood stabilization & psychotic symptoms. The plan is for eventual discontinuation of Seroquel. Monitor for mood instability with decrease.  5.  Continue lithium carbonate (Lithobid) 300 mg p.o. nightly for mood stabilization. 6.  Disposition planning-in progress.  Appreciate social work assistance in helping patient create a new discharge plan.  Laveda AbbeLaurie Britton Rue Valladares, NP 12/19/2020, 1:07 PM

## 2020-12-19 NOTE — Progress Notes (Signed)
   12/19/20 0700  Vital Signs  Temp 98 F (36.7 C)  Temp Source Oral  Pulse Rate 90  Pulse Rate Source Dinamap  Resp 16  BP 124/86  BP Location Right Arm  BP Method Automatic  Patient Position (if appropriate) Sitting  Oxygen Therapy  SpO2 100 %   D:  Patient was angry and labile this morning Pt. Stated "They lied to me they said I was going to be out of here..I'm going to go off!! A:  Patient took scheduled medicine. Pt was given  10 mg Zyprexa for agitation. Zyprexa helped with the agitation.  Was given Support and encouragement provided Routine safety checks conducted every 15 minutes. Patient  Informed to notify staff with any concerns.   R:  Safety maintained.

## 2020-12-19 NOTE — Progress Notes (Signed)
   12/19/20 2120  COVID-19 Daily Checkoff  Have you had a fever (temp > 37.80C/100F)  in the past 24 hours?  No  If you have had runny nose, nasal congestion, sneezing in the past 24 hours, has it worsened? No  COVID-19 EXPOSURE  Have you traveled outside the state in the past 14 days? No  Have you been in contact with someone with a confirmed diagnosis of COVID-19 or PUI in the past 14 days without wearing appropriate PPE? No  Have you been living in the same home as a person with confirmed diagnosis of COVID-19 or a PUI (household contact)? No  Have you been diagnosed with COVID-19? No

## 2020-12-19 NOTE — [facility] Group Notes (Signed)
LCSW Group Therapy Note  12/19/2020       Type of Therapy and Topic:  Group Therapy: "My Mental Health"  Participation Level:  Did Not Attend   Description of Group:   In this group, patients were asked four questions in order to generate discussion around the idea of mental illness and/or substance addiction being medical problems: In one sentence describe the current state of your mental health or substance use. How much do you feel similar to or different from others? Do you tend to identify with other people or compare yourself to them?  In a word or sentence, share what you desire your mental health or substance use to be moving forward.  Discussion was held that led to the conclusion that comparing ourselves to others is not healthy, but identifying with the elements of their issues that are similar to ours is helpful.    Therapeutic Goals: Patients will identify their feelings about their current mental health/substance use problems. Patients will describe how they feel similar to or different from others, and whether they tend to identify with or compare themselves to other people with the same issues. Patients will explore the differences in these concepts and how a change of mindset about mental health/substance use can help with reaching recovery goals. Patients will think about and share what their recovery goals are, in terms of mental health and/or substance use.  Summary of Patient Progress:  The patient did not attend group.  Therapeutic Modalities:   Processing Motivational Interviewing Psychoeducation  Jalyiah Shelley J Grossman-Orr, MSW, LCSW  

## 2020-12-19 NOTE — Progress Notes (Signed)
Patient is isolative to his room but is compliant with his medications. He was pleasant and apologized for his behavior this morning.He is hopeful to discharge very soon.

## 2020-12-19 NOTE — Progress Notes (Signed)
   12/18/20 2035  Psych Admission Type (Psych Patients Only)  Admission Status Voluntary  Psychosocial Assessment  Patient Complaints Anxiety  Eye Contact Brief  Facial Expression Anxious  Affect Anxious;Appropriate to circumstance  Speech Logical/coherent  Interaction Assertive  Motor Activity Other (Comment) (wnl)  Appearance/Hygiene Unremarkable  Behavior Characteristics Cooperative  Mood Anxious  Thought Process  Coherency Concrete thinking  Content WDL  Delusions None reported or observed  Perception WDL  Hallucination None reported or observed  Judgment Limited  Confusion None  Danger to Self  Current suicidal ideation? Denies  Danger to Others  Danger to Others None reported or observed   Pt denies SI, HI, AVH. Rates pain 5/10 in R knee. Pt rates anxiety 4/10. Pt takes meds appropriately.

## 2020-12-19 NOTE — Progress Notes (Signed)
Adult Psychoeducational Group Note  Date:  12/19/2020 Time:  11:21 PM  Group Topic/Focus:  Wrap-Up Group:   The focus of this group is to help patients review their daily goal of treatment and discuss progress on daily workbooks.  Participation Level:  Did Not Attend  Participation Quality:  Did Not Attend  Affect:  Did Not Attend  Cognitive:  Did Not Attend  Insight: None  Engagement in Group:  Did Not Attend  Modes of Intervention:  Did Not Attend  Additional Comments:  Pt did not attend evening wrap up group tonight.  [name] 12/19/2020, 11:21 PM

## 2020-12-20 MED ORDER — FLUPHENAZINE HCL 10 MG PO TABS: 10.0000 mg | ORAL_TABLET | Freq: Two times a day (BID) | ORAL | 0 refills | Status: DC

## 2020-12-20 MED ORDER — QUETIAPINE FUMARATE 100 MG PO TABS: 100.0000 mg | ORAL_TABLET | Freq: Every day | ORAL | 0 refills | Status: DC

## 2020-12-20 MED ORDER — LITHIUM CARBONATE ER 300 MG PO TBCR: 300.0000 mg | EXTENDED_RELEASE_TABLET | Freq: Every day | ORAL | 0 refills | Status: DC

## 2020-12-20 MED ORDER — HYDROXYZINE HCL 25 MG PO TABS: 25.0000 mg | ORAL_TABLET | Freq: Three times a day (TID) | ORAL | 0 refills | Status: DC | PRN

## 2020-12-20 NOTE — Progress Notes (Signed)
  [facility] Adult Case Management Discharge Plan :  Will you be returning to the same living situation after discharge:  No. Will discharged to [facility] At discharge, do you have transportation home?: No. Safe Transport to be arranged. Do you have the ability to pay for your medications: Yes,  has insurance  Release of information consent forms completed and in the chart;  Patient's signature needed at discharge.  Patient to Follow up at:  Follow-up Information    [facility] Follow up.   Specialty: Behavioral Health Why: Walk-in to establish services for therapy and medication management.  Contact information: [address] [place] [address] [phone]              Next level of care provider has access to [facility] Link:no  Safety Planning and Suicide Prevention discussed: Yes,  with mother  Have you used any form of tobacco in the last 30 days? (Cigarettes, Smokeless Tobacco, Cigars, and/or Pipes): Yes  Has patient been referred to the Quitline?: Patient refused referral  Patient has been referred for addiction treatment: Yes  Otelia Santee, LCSW 12/20/2020, 2:07 PM

## 2020-12-20 NOTE — Progress Notes (Signed)
Pt discharged to lobby. Safe transport present to take patient to [facility]. Pt was stable and appreciative at that time. All papers, samples, prescriptions were given and valuables returned. Verbal understanding expressed. Denies SI/HI and A/VH. Pt given opportunity to express concerns and ask questions.

## 2020-12-20 NOTE — Progress Notes (Addendum)
   12/20/20 1000  Psych Admission Type (Psych Patients Only)  Admission Status Voluntary  Psychosocial Assessment  Patient Complaints Worrying  Eye Contact Brief  Facial Expression Anxious  Affect Anxious;Appropriate to circumstance  Speech Logical/coherent  Interaction Assertive  Motor Activity Other (Comment) (wnl)  Appearance/Hygiene Unremarkable  Behavior Characteristics Cooperative;Appropriate to situation  Mood Anxious  Aggressive Behavior  Targets Self  Type of Behavior Verbal  Effect No apparent injury  Thought Process  Coherency Concrete thinking  Content Blaming others  Delusions None reported or observed  Perception WDL  Hallucination None reported or observed  Judgment Limited  Confusion None  Danger to Self  Current suicidal ideation? Denies  Self-Injurious Behavior No self-injurious ideation or behavior indicators observed or expressed   Agreement Not to Harm Self Yes  Description of Agreement pt verbally agrees to notify staff immediately for any thoughts of hurting himself or anyone else  Danger to Others  Danger to Others None reported or observed    D. Pt observed laying in bed upon initial approach this am- was mildly irritable, reported that he was lied to about his discharge. Pt stated, "I am so bored. I need my laptop and phone."  Moments later, pt observed in the dayroom smiling, participating in rec therapy with peers. Pt currently denies SI/HI and AVH A. Labs and vitals monitored. Pt given and educated on medications. Pt supported emotionally and encouraged to express concerns and ask questions.   R. Pt remains safe with 15 minute checks. Will continue POC.

## 2020-12-20 NOTE — Progress Notes (Addendum)
Recreation Therapy Notes  Date: 5.30.22 Time: 1000 Location: 500 [address] Dayroom  Group Topic: Wellness  Goal Area(s) Addresses:  Patient will define components of whole wellness. Patient will verbalize benefit of whole wellness.  Behavioral Response: Active  Intervention: Exercise, Music  Activity: Exercise.  LRT lead group in a series of stretches to get them loosened up for the rest of the activity.  Patients would then take turns leading the group in exercises of their choosing.  Patients were encouraged to do the best they could for at least 30 minutes.  Patients were also encouraged to take breaks or get water if needed.  Education: Wellness, Building control surveyor.   Education Outcome: Acknowledges education/In group clarification offered/Needs additional education.   Clinical Observations/Feedback: Pt was able to complete most of the exercises despite having a bad knee.  Pt led the group in jumping jacks and mountain climbers.  Pt was social with peers and appeared to be enjoying the activity.    Caroll Rancher, LRT/CTRS         Lillia Abed, Alhaji Mcneal A 12/20/2020 12:12 PM

## 2020-12-20 NOTE — Tx Team (Signed)
Interdisciplinary Treatment and Diagnostic Plan Update  12/20/2020 Time of Session:  10:00am Clinton Fisher MRN: [id number]  Principal Diagnosis: Bipolar I disorder, current or most recent episode manic, with psychotic features (HCC)  Secondary Diagnoses: Principal Problem:   Bipolar I disorder, current or most recent episode manic, with psychotic features (HCC) Active Problems:   Cannabis dependence, continuous (HCC)   GAD (generalized anxiety disorder)   Tobacco use disorder   Current Medications:  Current Facility-Administered Medications  Medication Dose Route Frequency Provider Last Rate Last Admin  . acetaminophen (TYLENOL) tablet 650 mg  650 mg Oral Q6H PRN White, Patrice L, NP   650 mg at 12/18/20 2049  . alum & mag hydroxide-simeth (MAALOX/MYLANTA) 200-200-20 MG/5ML suspension 30 mL  30 mL Oral Q4H PRN White, Patrice L, NP   30 mL at 12/13/20 1809  . fluPHENAZine (PROLIXIN) tablet 10 mg  10 mg Oral BID Antonieta Pert, MD   10 mg at 12/20/20 0802  . hydrOXYzine (ATARAX/VISTARIL) tablet 25 mg  25 mg Oral TID PRN White, Patrice L, NP   25 mg at 12/20/20 0802  . lithium carbonate (LITHOBID) CR tablet 300 mg  300 mg Oral QHS Antonieta Pert, MD   300 mg at 12/19/20 2056  . LORazepam (ATIVAN) injection 1 mg  1 mg Intramuscular Q6H PRN Mason Jim, Amy E, MD      . ziprasidone (GEODON) injection 20 mg  20 mg Intramuscular Q12H PRN Comer Locket, MD       And  . LORazepam (ATIVAN) tablet 1 mg  1 mg Oral PRN Mason Jim, Amy E, MD      . magnesium hydroxide (MILK OF MAGNESIA) suspension 30 mL  30 mL Oral Daily PRN White, Patrice L, NP      . nicotine (NICODERM CQ - dosed in mg/24 hours) patch 21 mg  21 mg Transdermal Daily Antonieta Pert, MD   21 mg at 12/20/20 0803  . OLANZapine zydis (ZYPREXA) disintegrating tablet 10 mg  10 mg Oral Q8H PRN Antonieta Pert, MD   10 mg at 12/19/20 0742  . QUEtiapine (SEROQUEL) tablet 100 mg  100 mg Oral QHS Claudie Revering, MD   100 mg at  12/19/20 2056   PTA Medications: Medications Prior to Admission  Medication Sig Dispense Refill Last Dose  . lidocaine (LIDODERM) 5 % Place 1 patch onto the skin daily. Remove & Discard patch within 12 hours or as directed by MD (Patient not taking: Reported on 12/09/2020) 10 patch 0   . nabumetone (RELAFEN) 750 MG tablet Take 1 tablet (750 mg total) by mouth 2 (two) times daily. (Patient not taking: Reported on 12/09/2020) 20 tablet 0     Patient Stressors: Financial difficulties Marital or family conflict Occupational concerns  Patient Strengths: Average or above average intelligence Supportive family/friends  Treatment Modalities: Medication Management, Group therapy, Case management,  1 to 1 session with clinician, Psychoeducation, Recreational therapy.   Physician Treatment Plan for Primary Diagnosis: Bipolar I disorder, current or most recent episode manic, with psychotic features (HCC) Long Term Goal(s): Improvement in symptoms so as ready for discharge Improvement in symptoms so as ready for discharge   Short Term Goals: Ability to identify changes in lifestyle to reduce recurrence of condition will improve Ability to verbalize feelings will improve Ability to disclose and discuss suicidal ideas Ability to demonstrate self-control will improve Ability to identify and develop effective coping behaviors will improve Ability to maintain clinical measurements within normal limits  will improve Compliance with prescribed medications will improve Ability to identify triggers associated with substance abuse/mental health issues will improve Ability to identify changes in lifestyle to reduce recurrence of condition will improve Ability to verbalize feelings will improve Ability to disclose and discuss suicidal ideas Ability to demonstrate self-control will improve Ability to identify and develop effective coping behaviors will improve Ability to maintain clinical measurements within  normal limits will improve Compliance with prescribed medications will improve Ability to identify triggers associated with substance abuse/mental health issues will improve  Medication Management: Evaluate patient's response, side effects, and tolerance of medication regimen.  Therapeutic Interventions: 1 to 1 sessions, Unit Group sessions and Medication administration.  Evaluation of Outcomes: Progressing  Physician Treatment Plan for Secondary Diagnosis: Principal Problem:   Bipolar I disorder, current or most recent episode manic, with psychotic features (HCC) Active Problems:   Cannabis dependence, continuous (HCC)   GAD (generalized anxiety disorder)   Tobacco use disorder  Long Term Goal(s): Improvement in symptoms so as ready for discharge Improvement in symptoms so as ready for discharge   Short Term Goals: Ability to identify changes in lifestyle to reduce recurrence of condition will improve Ability to verbalize feelings will improve Ability to disclose and discuss suicidal ideas Ability to demonstrate self-control will improve Ability to identify and develop effective coping behaviors will improve Ability to maintain clinical measurements within normal limits will improve Compliance with prescribed medications will improve Ability to identify triggers associated with substance abuse/mental health issues will improve Ability to identify changes in lifestyle to reduce recurrence of condition will improve Ability to verbalize feelings will improve Ability to disclose and discuss suicidal ideas Ability to demonstrate self-control will improve Ability to identify and develop effective coping behaviors will improve Ability to maintain clinical measurements within normal limits will improve Compliance with prescribed medications will improve Ability to identify triggers associated with substance abuse/mental health issues will improve     Medication Management: Evaluate  patient's response, side effects, and tolerance of medication regimen.  Therapeutic Interventions: 1 to 1 sessions, Unit Group sessions and Medication administration.  Evaluation of Outcomes: Progressing   RN Treatment Plan for Primary Diagnosis: Bipolar I disorder, current or most recent episode manic, with psychotic features (HCC) Long Term Goal(s): Knowledge of disease and therapeutic regimen to maintain health will improve  Short Term Goals: Ability to remain free from injury will improve, Ability to participate in decision making will improve and Ability to verbalize feelings will improve  Medication Management: RN will administer medications as ordered by provider, will assess and evaluate patient's response and provide education to patient for prescribed medication. RN will report any adverse and/or side effects to prescribing provider.  Therapeutic Interventions: 1 on 1 counseling sessions, Psychoeducation, Medication administration, Evaluate responses to treatment, Monitor vital signs and CBGs as ordered, Perform/monitor CIWA, COWS, AIMS and Fall Risk screenings as ordered, Perform wound care treatments as ordered.  Evaluation of Outcomes: Progressing   LCSW Treatment Plan for Primary Diagnosis: Bipolar I disorder, current or most recent episode manic, with psychotic features (HCC) Long Term Goal(s): Safe transition to appropriate next level of care at discharge, Engage patient in therapeutic group addressing interpersonal concerns.  Short Term Goals: Engage patient in aftercare planning with referrals and resources, Increase social support and Increase ability to appropriately verbalize feelings  Therapeutic Interventions: Assess for all discharge needs, 1 to 1 time with Social worker, Explore available resources and support systems, Assess for adequacy in community support network, Educate  family and significant other(s) on suicide prevention, Complete Psychosocial Assessment,  Interpersonal group therapy.  Evaluation of Outcomes: Progressing   Progress in Treatment: Attending groups: Yes. Participating in groups: Yes. Taking medication as prescribed: Yes. Toleration medication: Yes. Family/Significant other contact made: Yes, individual(s) contacted:  Mother Patient understands diagnosis: No. Discussing patient identified problems/goals with staff: Yes. Medical problems stabilized or resolved: Yes. Denies suicidal/homicidal ideation: Yes. Issues/concerns per patient self-inventory: No. Other: None  New problem(s) identified: No, Describe:  None  New Short Term/Long Term Goal(s):medication stabilization, elimination of SI thoughts, development of comprehensive mental wellness plan.  Patient Goals: "no goals...but to live on my own....to be heard for the first time in 13 years."   Discharge Plan or Barriers: Patient is to discharge to [facility] and is to follow up for psychiatry at [facility].   Reason for Continuation of Hospitalization: Medication stabilization  Estimated Length of Stay: 3-5 days  Attendees: Patient:  12/20/2020   Physician:  12/20/2020   Nursing:  12/20/2020   RN Care Manager: 12/20/2020   Social Worker: Ruthann Cancer, LCSW 12/20/2020   Recreational Therapist:  12/20/2020   Other:  12/20/2020   Other:  12/20/2020  Other: 12/20/2020         Scribe for Treatment Team: Otelia Santee, LCSW 12/20/2020 11:12 AM

## 2020-12-20 NOTE — Discharge Summary (Signed)
Physician Discharge Summary Note  Patient:  Clinton Fisher is an 30 y.o., male MRN:  [id number] DOB:  [birth year] Patient phone:  [phone] (home)  Patient address:   [address] [place] [address],  Total Time spent with patient: 30 minutes  Date of Admission:  12/09/2020 Date of Discharge: 12/20/2020  Reason for Admission:  (From MD's admission note): Patient is a 30 year old male with a reported past psychiatric history significant for bipolar disorder, most recently manic, severe with psychotic features who originally presented to the behavioral health urgent care center on 12/08/2020. He was accompanied by his mother, and was noted to be anxious and fidgety. It was felt that he was depressed. He was labile and went from laughing to tearfulness quite rapidly. He was noted to be tangential. His mother was concerned for the patient's safety and keeps all firearms away from him. At that point she stated that she does not trust him. He also expressed suicidal and homicidal ideation. He also uses marijuana on a daily basis. He denied auditory or visual hallucinations, but clearly appeared paranoid. He was then transferred to the [facility] emergency department later on 12/08/2020. Seroquel 25 mg p.o. twice daily was started. He was transferred to our facility on 12/10/2020.  During the interview today he is quite labile. He talks about suicidality as well as homicidality. He is paranoid about his mother. He stated he had been on 20 different medicines in the past, and was unable to remember any of these medicines. He stated he had not taken any medicines and what he thought was approximately a year. Review of the electronic medical record revealed that he had been previously followed at the outpatient clinic at [facility]. His medications on the last visit (which appear to be June 2021) included Abilify 15 mg p.o. daily and Depakote DR 500 mg p.o. twice  daily.Review of the electronic medical record revealed that he had also been previously treated with Geodon, Seroquel, Lamictal, fluoxetine. He was transferred to our facility for evaluation and stabilization.  Evaluation on the unit: Patient was seen on the unit today. He is excited about discharge. He Will be discharging to [facility]. He was provided with samples of his medications and paper prescriptions. He is sleeping and his appetite is good. He has attended group therapy. He is taking his medications without issues. He agrees to continue taking his medications after discharge. He has been educated about the use of Delta 8 and that it causes psychosis. He stated he was not going to do this anymore and he allowed staff to dispose of his vape he was using for Delta 8. He is also aware he would not be allowed to have a vape with him at [facility]. Patient is stable for discharge today.    Principal Problem: Bipolar I disorder, current or most recent episode manic, with psychotic features [facility]) Discharge Diagnoses: Principal Problem:   Bipolar I disorder, current or most recent episode manic, with psychotic features (HCC) Active Problems:   Cannabis dependence, continuous (HCC)   GAD (generalized anxiety disorder)   Tobacco use disorder   Past Psychiatric History: See H&P  Past Medical History:  Past Medical History:  Diagnosis Date  . Abrasion of index finger 07/29/2013   left  . Bipolar I disorder, current or most recent episode manic, severe (HCC) 12/14/2020  . Bipolar I disorder, current or most recent episode manic, with psychotic features (HCC) 12/14/2020  . 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 GERD (gastroesophageal reflux disease)    no current med.  . Manic affective disorder with recurrent episode (HCC)   . Seasonal allergies    sore throat 07/29/2013    Past Surgical History:  Procedure Laterality Date  . FOOT FOREIGN BODY REMOVAL Right  01/19/2011  . KNEE ARTHROSCOPY Right 08/01/2013   Procedure: ARTHROSCOPY RIGHT KNEE, Chondroplasty, Excision of Plica;  Surgeon: Harvie Junior, MD;  Location: [facility];  Service: Orthopedics;  Laterality: Right;   Family History: History reviewed. No pertinent family history. Family Psychiatric  History: See H&P Social History:  Social History   Substance and Sexual Activity  Alcohol Use Yes   Comment: every 3 days     Social History   Substance and Sexual Activity  Drug Use No    Social History   Socioeconomic History  . Marital status: Single    Spouse name: Not on file  . Number of children: Not on file  . Years of education: Not on file  . Highest education level: Not on file  Occupational History  . Not on file  Tobacco Use  . Smoking status: Current Every Day Smoker  . Smokeless tobacco: Never Used  Substance and Sexual Activity  . Alcohol use: Yes    Comment: every 3 days  . Drug use: No  . Sexual activity: Not on file  Other Topics Concern  . Not on file  Social History Narrative  . Not on file   Social Determinants of Health   Financial Resource Strain: Not on file  Food Insecurity: Not on file  Transportation Needs: Not on file  Physical Activity: Not on file  Stress: Not on file  Social Connections: Not on file    Hospital Course:  After the above admission evaluation, Clinton Fisher's  presenting symptoms were noted. He was recommended for mood stabilization treatments. The medication regimen targeting those presenting symptoms were discussed with him & initiated with his consent. His UDS on arrival to the ED was positive for THC, BAL was negative.  He was however medicated, stabilized & discharged on the medications as listed on his discharge medication list below. Besides the mood stabilization treatments, Clinton Fisher was also enrolled & participated in the group counseling sessions being offered & held on this unit. He learned coping skills. He  presented no other significant pre-existing medical issues that required treatment. He tolerated his treatment regimen without any adverse effects or reactions reported.   During the course of his hospitalization, the 15-minute checks were adequate to ensure patient's safety. Thaison did not display any dangerous, violent or suicidal behavior on the unit.  He interacted with patients & staff appropriately, participated appropriately in the group sessions/therapies. His medications were addressed & adjusted to meet his needs. He was recommended for outpatient follow-up care & medication management upon discharge to assure continuity of care & mood stability.  At the time of discharge patient is not reporting any acute suicidal/homicidal ideations. He feels more confident about his self-care & in managing his mental health. He currently denies any new issues or concerns. Education and supportive counseling provided throughout his hospital stay & upon discharge.   Today upon his discharge evaluation with the attending psychiatrist, Lenon shares he is doing well. He denies any other specific concerns. He is sleeping well. His appetite is good. He denies other physical complaints. He denies AH/VH, delusional thoughts or paranoia. He does not appear to be  responding to any internal stimuli. He feels that his medications have been helpful & is in agreement to continue his current treatment regimen as recommended. He was able to engage in safety planning including plan to return to [facility] or contact emergency services if he feels unable to maintain his own safety or the safety of others. Pt had no further questions, comments, or concerns. He left [facility] with all personal belongings in no apparent distress. Transportation per [employer].   Physical Findings: AIMS: Facial and Oral Movements Muscles of Facial Expression: None, normal Lips and Perioral Area: None, normal Jaw: None, normal Tongue: None,  normal,Extremity Movements Upper (arms, wrists, hands, fingers): None, normal Lower (legs, knees, ankles, toes): None, normal, Trunk Movements Neck, shoulders, hips: None, normal, Overall Severity Severity of abnormal movements (highest score from questions above): None, normal Incapacitation due to abnormal movements: None, normal Patient's awareness of abnormal movements (rate only patient's report): No Awareness, Dental Status Current problems with teeth and/or dentures?: No Does patient usually wear dentures?: No  CIWA:    COWS:     Musculoskeletal: Strength & Muscle Tone: within normal limits Gait & Station: normal Patient leans: N/A  Psychiatric Specialty Exam:  Presentation  General Appearance: Fairly Groomed; Casual  Eye Contact:Good  Speech:Clear and Coherent; Normal Rate  Speech Volume:Normal  Handedness:Right  Mood and Affect  Mood:Euthymic  Affect:Congruent; Appropriate  Thought Process  Thought Processes:Coherent; Goal Directed; Linear  Descriptions of Associations:Intact  Orientation:Full (Time, Place and Person)  Thought Content:Logical  History of Schizophrenia/Schizoaffective disorder:No  Duration of Psychotic Symptoms:Greater than six months  Hallucinations:Hallucinations: None  Ideas of Reference:None  Suicidal Thoughts:Suicidal Thoughts: No  Homicidal Thoughts:Homicidal Thoughts: No  Sensorium  Memory:Immediate Good; Recent Good; Remote Good  Judgment:Good  Insight:Good  Executive Functions  Concentration:Good  Attention Span:Good  Recall:Good  Fund of Knowledge:Good  Language:Good  Psychomotor Activity  Psychomotor Activity:Psychomotor Activity: Normal  Assets  Assets:Communication Skills; Desire for Improvement; Physical Health; Resilience; Social Support  Sleep  Sleep:Sleep: Good Number of Hours of Sleep: 6.75  Physical Exam: Physical Exam Vitals and nursing note reviewed.  Constitutional:      Appearance:  Normal appearance.  HENT:     Head: Normocephalic.  Pulmonary:     Effort: Pulmonary effort is normal.  Musculoskeletal:        General: Normal range of motion.     Cervical back: Normal range of motion.  Neurological:     Mental Status: He is alert and oriented to person, place, and time.  Psychiatric:        Attention and Perception: Attention normal. He does not perceive auditory or visual hallucinations.        Mood and Affect: Mood normal.        Speech: Speech normal.        Behavior: Behavior normal. Behavior is cooperative.        Thought Content: Thought content is not paranoid or delusional. Thought content does not include homicidal or suicidal ideation. Thought content does not include homicidal or suicidal plan.    Review of Systems  Constitutional: Negative for fever.  HENT: Negative for congestion, sinus pain and sore throat.   Respiratory: Negative for cough and shortness of breath.   Cardiovascular: Negative for chest pain.  Gastrointestinal: Negative.   Genitourinary: Negative.   Musculoskeletal: Negative.   Neurological: Negative.    Blood pressure 105/83, pulse 79, temperature 98.1 F (36.7 C), temperature source Oral, resp. rate 18, height  (1.727 m), weight  78 kg, SpO2 98 %. Body mass index is 26.15 kg/m.   Have you used any form of tobacco in the last 30 days? (Cigarettes, Smokeless Tobacco, Cigars, and/or Pipes): Yes  Has this patient used any form of tobacco in the last 30 days? (Cigarettes, Smokeless Tobacco, Cigars, and/or Pipes) Yes, Yes, A prescription for an FDA-approved tobacco cessation medication was offered at discharge and the patient refused  Blood Alcohol level:  Lab Results  Component Value Date   [facility] <10 12/08/2020    Metabolic Disorder Labs:  Lab Results  Component Value Date   HGBA1C 5.3 12/10/2020   MPG 105.41 12/10/2020   No results found for: PROLACTIN Lab Results  Component Value Date   CHOL 139 12/10/2020   TRIG 93  12/10/2020   HDL 41 12/10/2020   CHOLHDL 3.4 12/10/2020   VLDL 19 12/10/2020   LDLCALC 79 12/10/2020    See Psychiatric Specialty Exam and Suicide Risk Assessment completed by Attending Physician prior to discharge.  Discharge destination:  [facility]  Is patient on multiple antipsychotic therapies at discharge:  No   Has Patient had three or more failed trials of antipsychotic monotherapy by history:  No  Recommended Plan for Multiple Antipsychotic Therapies: NA  Discharge Instructions    Diet - low sodium heart healthy   Complete by: As directed    Increase activity slowly   Complete by: As directed      Allergies as of 12/20/2020      Reactions   Trazodone And Nefazodone    Other reaction(s): Other priapism   Augmentin [amoxicillin-pot Clavulanate]    Diarrhea      Medication List    STOP taking these medications   lidocaine 5 % Commonly known as: Lidoderm   nabumetone 750 MG tablet Commonly known as: Relafen     TAKE these medications     Indication  fluPHENAZine 10 MG tablet Commonly known as: PROLIXIN Take 1 tablet (10 mg total) by mouth 2 (two) times daily.  Indication: Schizophrenia   hydrOXYzine 25 MG tablet Commonly known as: ATARAX/VISTARIL Take 1 tablet (25 mg total) by mouth 3 (three) times daily as needed for anxiety.  Indication: Feeling Anxious   lithium carbonate 300 MG CR tablet Commonly known as: LITHOBID Take 1 tablet (300 mg total) by mouth at bedtime.  Indication: Schizoaffective Disorder   QUEtiapine 100 MG tablet Commonly known as: SEROQUEL Take 1 tablet (100 mg total) by mouth at bedtime.  Indication: Manic Phase of Manic-Depression       Follow-up Information    Thedore MinsAkintayo, Mojeed, MD Follow up.   Specialty: Psychiatry Contact information: [address] [place] [id number] [phone]        [facility] Follow up.   Specialty: Behavioral Health Why: Walk-in to establish  services for therapy and medication management.  Contact information: [address] [place] [id number] [phone]              Follow-up recommendations:  Activity:  as tolerated Diet:  Heart Healthy  Comments:  Paper prescriptions were given at discharge.  Patient is agreeable to plan.  He was given the opportunity to ask questions.  He appears to feel comfortable with discharge and denies any current suicidal or homicidal thoughts.   Patient is instructed prior to discharge to: Take all medications as prescribed by his mental healthcare provider. Report any adverse effects and or reactions from the medicines to his  outpatient provider promptly. Patient has been instructed & cautioned: To not engage in alcohol and or illegal drug use while on prescription medicines. In the event of worsening symptoms, patient is instructed to call the crisis hotline, 911 and or go to the nearest ED for appropriate evaluation and treatment of symptoms. To follow-up with his primary care provider for your other medical issues, concerns and or health care needs.   Signed: Laveda Abbe, NP 12/20/2020, 2:47 PM

## 2020-12-20 NOTE — BHH Suicide Risk Assessment (Signed)
[facility] Discharge Suicide Risk Assessment   Principal Problem: Bipolar I disorder, current or most recent episode manic, with psychotic features (HCC) Discharge Diagnoses: Principal Problem:   Bipolar I disorder, current or most recent episode manic, with psychotic features (HCC) Active Problems:   Cannabis dependence, continuous (HCC)   GAD (generalized anxiety disorder)   Tobacco use disorder   Total Time spent with patient: 20 minutes  Musculoskeletal: Strength & Muscle Tone: within normal limits Gait & Station: normal Patient leans: N/A  Psychiatric Specialty Exam  Presentation  General Appearance: Fairly Groomed; Casual  Eye Contact:Good  Speech:Clear and Coherent; Normal Rate  Speech Volume:Normal  Handedness:Right   Mood and Affect  Mood:Euthymic  Duration of Depression Symptoms: Greater than two weeks  Affect:Congruent; Appropriate   Thought Process  Thought Processes:Coherent; Goal Directed; Linear  Descriptions of Associations:Intact  Orientation:Full (Time, Place and Person)  Thought Content:Logical  History of Schizophrenia/Schizoaffective disorder:No  Duration of Psychotic Symptoms:Greater than six months  Hallucinations:Hallucinations: None  Ideas of Reference:None  Suicidal Thoughts:Suicidal Thoughts: No  Homicidal Thoughts:Homicidal Thoughts: No   Sensorium  Memory:Immediate Good; Recent Good; Remote Good  Judgment:Good  Insight:Good   Executive Functions  Concentration:Good  Attention Span:Good  Recall:Good  Fund of Knowledge:Good  Language:Good   Psychomotor Activity  Psychomotor Activity:Psychomotor Activity: Normal   Assets  Assets:Communication Skills; Desire for Improvement; Physical Health; Resilience; Social Support   Sleep  Sleep:Sleep: Good Number of Hours of Sleep: 6.75   Physical Exam: Physical Exam Vitals and nursing note reviewed.  Constitutional:      General: He is not in acute  distress. HENT:     Head: Normocephalic.  Pulmonary:     Effort: Pulmonary effort is normal.  Neurological:     General: No focal deficit present.     Mental Status: He is alert and oriented to person, place, and time.    Review of Systems  Constitutional: Negative.   Respiratory: Negative.   Cardiovascular: Negative.   Gastrointestinal: Negative.   Musculoskeletal: Negative.   Neurological: Negative.   Psychiatric/Behavioral: Negative.  Negative for depression, hallucinations and suicidal ideas. The patient is not nervous/anxious and does not have insomnia.    Blood pressure 105/83, pulse 79, temperature 98.1 F (36.7 C), temperature source Oral, resp. rate 18, height 5\' 8"  (1.727 m), weight 78 kg, SpO2 98 %. Body mass index is 26.15 kg/m.  Mental Status Per Nursing Assessment::   On Admission:  NA  Demographic Factors:  Male, Caucasian, Low socioeconomic status and Unemployed  Loss Factors: NA  Historical Factors: NA  Risk Reduction Factors:   Positive social support and future oriented outlook  Continued Clinical Symptoms:  Bipolar Disorder - currently euthymic Alcohol/Substance Abuse/Dependencies Psychotic symptoms - resolved on medications  Cognitive Features That Contribute To Risk:  None    Suicide Risk:  Minimal: No identifiable suicidal ideation.  Patients presenting with no risk factors but with morbid ruminations; may be classified as minimal risk based on the severity of the depressive symptoms   Follow-up Information    [phone], MD Follow up.   Specialty: Psychiatry Contact information: [address] [place] [phone]        [facility] Follow up.   Specialty: Behavioral Health Why: Walk-in to establish services for therapy and medication management.  Contact information: [address] [place] [phone]              Plan Of Care/Follow-up  recommendations:  Activity:  as tolerated  Tests:  you will periodically need to have blood drawn to check you cholesterol and blood sugar while you are taking Seroquel and to check your thyroid status, kidney function and lithium level while you are taking lithium.  Your outpatient doctor will tell you when you next need to have blood drawn for lab work  Other:   -Take medications as prescribed.   -Do not drink alcohol.  Do not use marijuana or other drugs. -Attend substance abuse treatment program.   -Keep outpatient mental health appointments with therapist and psychiatrist.   -See your primary care provider regarding treatment of medical conditions.  Claudie Revering, MD 12/20/2020, 1:36 PM

## 2020-12-20 NOTE — BHH Group Notes (Signed)
BHH Group Notes:  (Nursing/MHT/Case Management/Adjunct)  Date:  12/20/2020  Time:  9:55 AM  Type of Therapy:  Group Therapy  Participation Level:  Active  Participation Quality:  Appropriate  Affect:  Appropriate and Irritable  Cognitive:  Alert and Appropriate  Insight:  Appropriate and Good  Engagement in Group:  Engaged  Modes of Intervention:  Orientation  Summary of Progress/Problems: His goal for today is to get the truth out of the doctor in regards to when he will be getting released.   Nela Bascom J Kelton Bultman 12/20/2020, 9:55 AM

## 2020-12-21 NOTE — Progress Notes (Signed)
Recreation Therapy Notes  INPATIENT RECREATION TR PLAN  Patient Details Name: Clinton Fisher MRN: [id number] DOB: 06/04/1991 Today's Date: 12/21/2020  Rec Therapy Plan Is patient appropriate for Therapeutic Recreation?: Yes Treatment times per week: about 3 days Estimated Length of Stay: 5-7 days TR Treatment/Interventions: Group participation (Comment)  Discharge Criteria Pt will be discharged from therapy if:: Discharged Treatment plan/goals/alternatives discussed and agreed upon by:: Patient/family  Discharge Summary Short term goals set: See patient care plan Short term goals met: Adequate for discharge Progress toward goals comments: Groups attended Which groups?: Wellness,Coping skills,Communication,Other (Comment) (Team Building) Reason goals not met: None Therapeutic equipment acquired: N/A Reason patient discharged from therapy: Discharge from hospital Pt/family agrees with progress & goals achieved: Yes Date patient discharged from therapy: 12/20/20    , LRT/CTRS  ,  A 12/21/2020, 8:44 AM  

## 2020-12-21 NOTE — Plan of Care (Signed)
Patient was able to identify how using coping skills could make for overall better outcomes at completion of recreation therapy group sessions.    Caroll Rancher, LRT/CTRS

## 2020-12-22 ENCOUNTER — Telehealth (HOSPITAL_COMMUNITY): Payer: Self-pay | Admitting: Licensed Clinical Social Worker

## 2020-12-27 ENCOUNTER — Ambulatory Visit (INDEPENDENT_AMBULATORY_CARE_PROVIDER_SITE_OTHER): Payer: 59 | Admitting: Licensed Clinical Social Worker

## 2020-12-27 ENCOUNTER — Other Ambulatory Visit: Payer: Self-pay

## 2020-12-27 ENCOUNTER — Telehealth (HOSPITAL_COMMUNITY): Payer: Self-pay | Admitting: Psychiatry

## 2020-12-27 DIAGNOSIS — F39 Unspecified mood [affective] disorder: Secondary | ICD-10-CM

## 2020-12-27 NOTE — Telephone Encounter (Signed)
D:  Returned call to pt's mother Mateo Flow [phone]).  According to Ms. Mateo Flow, she and her son (pt) met with Micheline Chapman, LCSW, LCAS this morning, but he wasn't appropriate for her group.  A:  After hearing what's been going on with pt and reading his chart; pt isn't appropriate for MH-IOP level of care either.  Pt is in need of PHP level of care.  Discussed PHP options with pt's mother and provided her with referrals.  R:  Mother was receptive.

## 2020-12-27 NOTE — Progress Notes (Signed)
Comprehensive Clinical Assessment (CCA) Screening, Triage and Referral Note  12/27/2020 Clinton Fisher [id number]  Chief Complaint:  Chief Complaint  Patient presents with  . Addiction Problem  . Depression    R/O substance induced mood disorder with recent psychotic features    Visit Diagnosis: unspecified mood disorder hx dx of bipolar dx r/o substance induced mood disorder  Patient Reported Information How did you hear about [place]? Family/Friend   Referral name: [facility], Mother Clinton Fisher (accompanied client here today)   Referral phone number: [phone]  Whom do you see for routine medical problems? Hospital ER   Practice/Facility Name: No PCP   Practice/Facility Phone Number: No data recorded  Name of Contact: No data recorded  Contact Number: No data recorded  Contact Fax Number: No data recorded  Prescriber Name: No data recorded  Prescriber Address (if known): No data recorded What Is the Reason for Your Visit/Call Today? Client reports needing to keep taking medicine recently prescribed. Client denies substance use in the past week, reporting previously switching from marijuana to delta 8 vape. Client reports thinking more clearly since stopping vaping.  How Long Has This Been Causing You Problems? 1 wk - 1 month  Have You Recently Been in Any Inpatient Treatment (Hospital/Detox/Crisis Center/28-Day Program)? Yes   Name/Location of Program/Hospital:May 2022, [facility] to ED to [facility] (refill medications)   How Long Were You There? No data recorded  When Were You Discharged? No data recorded Have You Ever Received Services From [facility] Before? Yes   Who Do You See at [facility]? Various providers in the EDs and Urgent Care  Have You Recently Had Any Thoughts About Hurting Yourself? No   Are You Planning to Commit Suicide/Harm Yourself At This time?  No  Have you Recently Had Thoughts About Hurting Someone Clinton Fisher? No   Explanation: No data recorded Have  You Used Any Alcohol or Drugs in the Past 24 Hours? No   How Long Ago Did You Use Drugs or Alcohol?  No data recorded  What Did You Use and How Much? No data recorded What Do You Feel Would Help You the Most Today? Alcohol or Drug Use Treatment; Treatment for Depression or other mood problem; Medication(s)  Do You Currently Have a Therapist/Psychiatrist? No   Name of Therapist/Psychiatrist: No data recorded  Have You Been Recently Discharged From Any Office Practice or Programs? No   Explanation of Discharge From Practice/Program:  No data recorded    CCA Screening Triage Referral Assessment Type of Contact: Face-to-Face   Is this Initial or Reassessment? No data recorded  Date Telepsych consult ordered in [facility]:  No data recorded  Time Telepsych consult ordered in [facility]:  No data recorded Patient Reported Information Reviewed? Yes   Patient Left Without Being Seen? No data recorded  Reason for Not Completing Assessment: No data recorded Collateral Involvement: Clinton Fisher, mother  Does Patient Have a Court Appointed Legal Guardian? No data recorded  Name and Contact of Legal Guardian:  No data recorded If Minor and Not Living with Parent(s), Who has Custody? N/A  Is CPS involved or ever been involved? Never  Is APS involved or ever been involved? Never  Patient Determined To Be At Risk for Harm To Self or Others Based on Review of Patient Reported Information or Presenting Complaint? No   Method: No data recorded  Availability of Means: No data recorded  Intent: No data recorded  Notification Required: No data recorded  Additional  Information for Danger to Others Potential:  No data recorded  Additional Comments for Danger to Others Potential:  No data recorded  Are There Guns or Other Weapons in Your Home?  No data recorded   Types of Guns/Weapons: No data recorded   Are These Weapons Safely Secured?                              No data recorded   Who Could Verify You  Are Able To Have These Secured:    No data recorded Do You Have any Outstanding Charges, Pending Court Dates, Parole/Probation? No data recorded Contacted To Inform of Risk of Harm To Self or Others: Other: Comment (Mother present at screening)  Location of Assessment: Other (comment) ([facility] [place])  Does Patient Present under Involuntary Commitment? No   IVC Papers Initial File Date: No data recorded  [place] of Residence: [place]  Patient Currently Receiving the Following Services: Not Receiving Services   Determination of Need: Urgent (48 hours)   Options For Referral: Medication Management; Partial Hospitalization; Group Home (Client not appropriate for CDIOP referral intially assigned. Provided information on PHP at [facility] for higher level of care.)   Harlon Ditty, LCSW

## 2021-01-11 ENCOUNTER — Ambulatory Visit (HOSPITAL_BASED_OUTPATIENT_CLINIC_OR_DEPARTMENT_OTHER)
Admission: RE | Admit: 2021-01-11 | Discharge: 2021-01-11 | Disposition: A | Discharge status: Home / Self Care | Payer: 59 | Source: Ambulatory Visit | Attending: Nurse Practitioner | Admitting: Nurse Practitioner

## 2021-01-11 ENCOUNTER — Ambulatory Visit (INDEPENDENT_AMBULATORY_CARE_PROVIDER_SITE_OTHER): Payer: 59 | Admitting: Nurse Practitioner

## 2021-01-11 ENCOUNTER — Other Ambulatory Visit: Payer: Self-pay

## 2021-01-11 ENCOUNTER — Encounter (HOSPITAL_BASED_OUTPATIENT_CLINIC_OR_DEPARTMENT_OTHER): Payer: Self-pay | Admitting: Nurse Practitioner

## 2021-01-11 VITALS — BP 108/72 | HR 73 | Ht 69.0 in | Wt 179.2 lb

## 2021-01-11 DIAGNOSIS — G8929 Other chronic pain: Secondary | ICD-10-CM

## 2021-01-11 DIAGNOSIS — F319 Bipolar disorder, unspecified: Secondary | ICD-10-CM

## 2021-01-11 DIAGNOSIS — F1220 Cannabis dependence, uncomplicated: Secondary | ICD-10-CM

## 2021-01-11 DIAGNOSIS — M25561 Pain in right knee: Secondary | ICD-10-CM | POA: Diagnosis present

## 2021-01-11 DIAGNOSIS — Z0000 Encounter for general adult medical examination without abnormal findings: Secondary | ICD-10-CM | POA: Insufficient documentation

## 2021-01-11 MED ORDER — MELOXICAM 7.5 MG PO TABS: 7.5000 mg | ORAL_TABLET | Freq: Every day | ORAL | 5 refills | Status: DC

## 2021-01-11 NOTE — Progress Notes (Signed)
Clinton Clamp, DNP, AGNP-c Primary Care Services ______________________________________________________________________  HPI Clinton Fisher is a 30 y.o. male presenting to [facility] [place] [facility] at [facility] today to establish care. Clinton Fisher is with his mother, Clinton Fisher, today. They both provide medical history today.   Patient Care Team: Clinton Fisher, Sung Amabile, NP as PCP - General (Nurse Practitioner) Clinton Harder, MD as Consulting Physician (Dermatology)   Concerns today: Seroquel Recent hospitalization for bipolar disorder with new medication initiation.  Pt and his mother tell me that he has had a difficult journey for the past 10+ years with his mental health. They endorse cycles of medication adherence followed by relapses of stopping medication and exacerbations of bipolar symptoms. Until recently, he was seen for psychiatric care by a [facility] provider. Since hospitalization, he will be followed by [facility]. He has had his initial visit with [facility] following discharge for medication management and his next follow-up is in 3 weeks.  Prior to his hospitalization he was homeless and not taking any medications for his mental health. His mother has agreed to allow him to live with her since his discharge with the understanding that she will ensure that he takes his medications and ensure that he makes it to his follow-up appointments appropriately. At this time this arrangement is working as planned.  He tells me he feels better than he has in 10 years mentally and feels that he is finally on the right track. He reports concerns today over the seroquel and side effect of tardive dyskinesia. He is concerned that he may develop this side effect and would like to discuss switching medications.   Knee Pain His primary concern today is right knee pain.  He reports that 7 years ago he underwent arthroscopic knee surgery and since that time he has experienced "chronic  inflammation and pain". He endorses that he did not have PT after surgery and immediately went back to work standing for long hours on his feet.  He reports that he is not unable to stand for more than 30 minutes without severe pain and "dying for me to sit"/ He also tells me that when standing he feels he must "lock" his knee in a hyperextended position in order to prevent falling.  He walks with a cane on the affected side to help stabilize and prevent falls.  He reports a previous cortisone injection into the knee that "lasted 45 minutes" before the pain returned at baseline.  He tells me the pain is constantly present and nothing makes it better or worse. He endorses pain at a 5/10 at baseline with levels up to 7/10. He describes the pain as being behind the knee cap and posteriorly. He describe it as feeling like "a fireball under the kneecap". He tells me that due to the pain he is unable to stand for any length of time and this has prevented him from finding work. He reports that he has tried to get a job a few times, but is unable to keep employment due to his limitations. He reports that he wants to feel better so that he can get a job. He has tried tylenol, ibuprofen, lidocaine patches, and diclofenac gel, ice, and heat for the pain and none have helped.  His mother reports concerns that he does not take the medications consistent enough for relief of symptoms. She also tells me that his patience is limited and he typically wants the pain completely eliminated with minimal intervention and this has  been a challenge for him. He agrees that he will not continue to try a method that does not relieve the pain on the first or second try.   He denies radiation of pain, numbness, edema, ecchymosis, decreased ROM. He endorses "popping" at times with movement. He has not seen orthopedics since initially post-op.   Patient Active Problem List   Diagnosis Date Noted   Chronic pain of right knee  01/11/2021   Encounter for medical examination to establish care 01/11/2021   Bipolar 1 disorder (HCC) 10/18/2018   Cannabis dependence, continuous (HCC) 10/18/2018   Tobacco use disorder 08/20/2017   GAD (generalized anxiety disorder) 09/25/2016   Health Maintenance Due  Topic Date Due   Pneumococcal Vaccine 540-31 Years old (1 - PCV) Never done   HIV Screening  Never done   Hepatitis C Screening  Never done   COVID-19 Vaccine (3 - Moderna risk series) 05/15/2020    PHQ9 Today: Depression screen PHQ 2/9 01/11/2021  Decreased Interest 0  Down, Depressed, Hopeless 2  PHQ - 2 Score 2  Altered sleeping 1  Tired, decreased energy 1  Change in appetite 1  Feeling bad or failure about yourself  1  Trouble concentrating 1  Moving slowly or fidgety/restless 3  Suicidal thoughts 0  PHQ-9 Score 10  Difficult doing work/chores Very difficult   GAD7 Today: GAD 7 : Generalized Anxiety Score 01/11/2021  Nervous, Anxious, on Edge 3  Control/stop worrying 1  Worry too much - different things 3  Trouble relaxing 1  Restless 3  Easily annoyed or irritable 3  Afraid - awful might happen 1  Total GAD 7 Score 15  Anxiety Difficulty Extremely difficult   ______________________________________________________________________ PMH Past Medical History:  Diagnosis Date   Abrasion of index finger 07/29/2013   left   Bipolar I disorder, current or most recent episode manic, severe (HCC) 12/14/2020   Bipolar I disorder, current or most recent episode manic, with psychotic features (HCC) 12/14/2020   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   Manic affective disorder with recurrent episode (HCC)    Seasonal allergies    sore throat 07/29/2013    ROS All review of systems negative except what is listed in the HPI  PHYSICAL EXAM Physical Exam Vitals and nursing note reviewed.  Constitutional:      Appearance: He is normal  weight.  HENT:     Head: Normocephalic.  Eyes:     Extraocular Movements: Extraocular movements intact.     Conjunctiva/sclera: Conjunctivae normal.     Pupils: Pupils are equal, round, and reactive to light.  Cardiovascular:     Rate and Rhythm: Normal rate and regular rhythm.     Chest Wall: PMI is not displaced.     Pulses: Normal pulses.     Heart sounds: Normal heart sounds.  Pulmonary:     Effort: Pulmonary effort is normal.     Breath sounds: Normal breath sounds.  Musculoskeletal:        General: Tenderness present. No deformity. Normal range of motion.     Cervical back: Normal range of motion.     Right upper leg: Normal.     Left upper leg: Normal.     Right knee: No swelling, deformity, erythema, ecchymosis, bony tenderness or crepitus. Normal range of motion. Tenderness present over the medial joint line and lateral joint line. No LCL laxity, MCL laxity, ACL  laxity or PCL laxity. Normal alignment, normal meniscus and normal patellar mobility. Normal pulse.     Instability Tests: Anterior drawer test negative.     Left knee: Normal.     Right lower leg: Normal. No edema.     Left lower leg: Normal. No edema.     Right ankle: Normal.     Left ankle: Normal.  Skin:    General: Skin is warm and dry.     Capillary Refill: Capillary refill takes less than 2 seconds.  Neurological:     Mental Status: He is alert and oriented to person, place, and time.     Cranial Nerves: No cranial nerve deficit.     Sensory: No sensory deficit.     Motor: No weakness.     Coordination: Coordination normal.  Psychiatric:        Attention and Perception: Attention and perception normal.        Mood and Affect: Affect normal. Mood is anxious.        Speech: Speech normal.        Behavior: Behavior is hyperactive. Behavior is cooperative.        Thought Content: Thought content normal.        Cognition and Memory: Cognition and memory normal.        Judgment: Judgment normal.      Comments: Jadd alternates between pacing back and forth to sitting throughout the interview. He appears anxious and impulsive, but maintains appropriate attention and judgement throughout the interview. His thought processes are appropriate.     ______________________________________________________________________ ASSESSMENT AND PLAN Problem List Items Addressed This Visit     Bipolar 1 disorder (HCC)    Recent exacerbation with hospitalization- new medications started.  Currently under care with [facility]. His mother is active in support and assisting in his care at this time. Discussed reduced risk of TD with second generation antipsychotic medications, such as seroquel. Given that this is a new medication, he is on low doses, and he is responding well to initial treatment, I recommend that the patient continue with this medication at this time, but discuss his concerns at his upcoming appointment with psychiatry.  At this time, I feel the risks associated with TD are far less than stopping the medication.  Research shows 0.1% to 1% of patients on seroquel may develop TD, which is minimal.  He agrees to continue the medication at this time and discuss with psychiatry for their recommendations further.         Cannabis dependence, continuous (HCC)    Pt reportedly has exacerbated mental health symptoms while using cannabis.  Reportedly avoiding use at this time due to this. Recommend that he avoid cannabis use, especially with the exacerbation of symptoms with use.       Chronic pain of right knee    Reported right knee pain after orthoscopic surgery 7 years ago. No evidence of abnormality, weakness, or joint laxity present on exam.  Recommend x-ray imaging of the knee for further evaluation. Possible osteoarthritic component present.  Will plan for formal PT to help with knee pain and strengthening to see if this helps with his symptoms.  Discussed that at least  6 weeks of treatment is recommended prior to determining if other options are needed.  Will trial mobic 7.5mg  once a day for inflammation and pain relief.  Careful consideration with concurrent use of lithium- current lithium dosing at 300 mg per day with recent levels showing  lower than therapuetic. Starting mobic at 7.5mg  unlikely to cause increased lithium levels or concerning side effects. Recommend discussing this with psychiatry at next appointment. If PT and medication management are not effective, recommend orthopedic evaluation for further recommendations.         Relevant Medications   meloxicam (MOBIC) 7.5 MG tablet   Other Relevant Orders   DG Knee Complete 4 Views Right   Ambulatory referral to Physical Therapy   Encounter for medical examination to establish care - Primary    Review of current and past medical history, social history, medication, and family history.  Review of care gaps and health maintenance recommendations.  Records from recent providers to be requested if not available in Chart Review or Care Everywhere.  Recommendations for health maintenance, diet, and exercise provided.          Education provided today during visit and on AVS for patient to review at home.  Diet and Exercise recommendations provided.  Current diagnoses and recommendations discussed. HM recommendations reviewed with recommendations.    Outpatient Encounter Medications as of 01/11/2021  Medication Sig   benztropine (COGENTIN) 0.5 MG tablet Take 0.5 mg by mouth 2 (two) times daily.   fluPHENAZine (PROLIXIN) 10 MG tablet Take 1 tablet (10 mg total) by mouth 2 (two) times daily. (Patient taking differently: Take 10 mg by mouth daily.)   hydrOXYzine (ATARAX/VISTARIL) 25 MG tablet Take 1 tablet (25 mg total) by mouth 3 (three) times daily as needed for anxiety.   lithium carbonate (LITHOBID) 300 MG CR tablet Take 1 tablet (300 mg total) by mouth at bedtime.   meloxicam (MOBIC) 7.5 MG  tablet Take 1 tablet (7.5 mg total) by mouth daily.   prazosin (MINIPRESS) 1 MG capsule Take 1 mg by mouth at bedtime.   QUEtiapine (SEROQUEL) 100 MG tablet Take 1 tablet (100 mg total) by mouth at bedtime. (Patient taking differently: Take 25 mg by mouth at bedtime.)   [DISCONTINUED] fluPHENAZine (PROLIXIN) 10 MG tablet Take by mouth.   No facility-administered encounter medications on file as of 01/11/2021.    Return if symptoms worsen or fail to improve.  Time: 60 minutes, >50% spent counseling, care coordination, chart review, and documentation.   Tollie Eth, DNP, AGNP-c

## 2021-01-11 NOTE — Assessment & Plan Note (Signed)
Pt reportedly has exacerbated mental health symptoms while using cannabis.  Reportedly avoiding use at this time due to this. Recommend that he avoid cannabis use, especially with the exacerbation of symptoms with use.

## 2021-01-11 NOTE — Patient Instructions (Addendum)
Recommendations from today's visit: We will plan to get an x-ray today of the knee and will plan to send a referral for physical therapy If you are unable to tolerate physical therapy or if you don't see any improvement after 4-6 weeks of therapy, then we will refer you to orthopedics for further evaluation and recommendations.  We will try a medication called Mobic to help with the inflammation and pain.   Information on diet, exercise, and health maintenance recommendations are listed below. This is information to help you be sure you are on track for optimal health and monitoring.   Please look over this and let us know if you have any questions or if you have completed any of the health maintenance outside of [facility] so that we can be sure your records are up to date.  ___________________________________________________________  Thank you for choosing [place] [facility] for your Primary Care needs. I am excited for the opportunity to partner with you to meet your health care goals. It was a pleasure meeting you today!  I am an Adult-Geriatric Nurse Practitioner with a background in caring for patients for more than 20 years. I received my Paediatric nurse in Nursing and my Doctor of Nursing Practice degrees at [employer]. I received additional fellowship training in primary care and sports medicine after receiving my doctorate degree. I provide primary care and sports medicine services to patients age 34 and older within this office. I am also a provider with the [facility] and the director of the APP Fellowship with [facility].  I am a [place] native, but have called the [place] area home for nearly 20 years and am proud to be a member of this community.   I am passionate about providing the best service to you through preventive medicine and supportive care. I consider you a part of the medical team and value your  input. I work diligently to ensure that you are heard and your needs are met in a safe and effective manner. I want you to feel comfortable with me as your provider and want you to know that your health concerns are important to me.   For your information, our office hours are Monday- Friday 8:00 AM - 5:00 PM At this time I am not in the office on Wednesdays.  If you have questions or concerns, please call our office at [phone] or send [place] a MyChart message and we will respond as quickly as possible.   For all urgent or time sensitive needs we ask that you please call the office to avoid delays. MyChart is not constantly monitored and replies may take up to 72 business hours.  MyChart Policy: MyChart allows for you to see your visit notes, after visit summary, provider recommendations, lab and tests results, make an appointment, request refills, and contact your provider or the office for non-urgent questions or concerns.  Providers are seeing patients during normal business hours and do not have built in time to review MyChart messages. We ask that you allow a minimum of 72 business hours for MyChart message responses.  Complex MyChart concerns may require a visit. Your provider may request you schedule a virtual or in person visit to ensure we are providing the best care possible. MyChart messages sent after 4:00 PM on Friday will not be received by the provider until Monday morning.    Lab and Test Results: You will receive your  lab and test results on MyChart as soon as they are completed and results have been sent by the lab or testing facility. Due to this service, you will receive your results BEFORE your provider.  Please allow a minimum of 72 business hours for your provider to receive and review lab and test results and contact you about.   Most lab and test result comments from the provider will be sent through [place]. Your provider may recommend changes to the plan of care,  follow-up visits, repeat testing, ask questions, or request an office visit to discuss these results. You may reply directly to this message or call the office at [phone] to provide information for the provider or set up an appointment. In some instances, you will be called with test results and recommendations. Please let us know if this is preferred and we will make note of this in your chart to provide this for you.    If you have not heard a response to your lab or test results in 72 business hours, please call the office to let us know.   After Hours: For all non-emergency after hours needs, please call the office at [phone] and select the option to reach the on-call provider service. On-call services are shared between multiple [facility] offices and therefore it will not be possible to speak directly with your provider. On-call providers may provide medical advice and recommendations, but are unable to provide refills for maintenance medications.  For all emergency or urgent medical needs after normal business hours, we recommend that you seek care at the closest Urgent Care or Emergency Department to ensure appropriate treatment in a timely manner.  [facility] has a 24 hour emergency room located on the ground floor for your convenience.    Please do not hesitate to reach out to [place] with concerns.   Thank you, again, for choosing me as your health care partner. I appreciate your trust and look forward to learning more about you.   Worthy Keeler, DNP, AGNP-c ___________________________________________________________  Health Maintenance Recommendations Screening Testing Mammogram Every 1 -2 years based on history and risk factors Starting at age 59 Pap Smear Ages 21-39 every 3 years Ages 36-65 every 5 years with HPV testing More frequent testing may be required based on results and history Colon Cancer Screening Every 1-10 years based on test  performed, risk factors, and history Starting at age 32 Bone Density Screening Every 2-10 years based on history Starting at age 77 for women Recommendations for men differ based on medication usage, history, and risk factors AAA Screening One time ultrasound Men 64-8 years old who have every smoked Lung Cancer Screening Low Dose Lung CT every 12 months Age 17-80 years with a 30 pack-year smoking history who still smoke or who have quit within the last 15 years  Screening Labs Routine  Labs: Complete Blood Count (CBC), Complete Metabolic Panel (CMP), Cholesterol (Lipid Panel) Every 6-12 months based on history and medications May be recommended more frequently based on current conditions or previous results Hemoglobin A1c Lab Every 3-12 months based on history and previous results Starting at age 26 or earlier with diagnosis of diabetes, high cholesterol, BMI >26, and/or risk factors Frequent monitoring for patients with diabetes to ensure blood sugar control Thyroid Panel (TSH w/ T3 & T4) Every 6 months based on history, symptoms, and risk factors May be repeated more often if on medication HIV One time testing for all patients 13 and  older May be repeated more frequently for patients with increased risk factors or exposure Hepatitis C One time testing for all patients 61 and older May be repeated more frequently for patients with increased risk factors or exposure Gonorrhea, Chlamydia Every 12 months for all sexually active persons 13-24 years Additional monitoring may be recommended for those who are considered high risk or who have symptoms PSA Men 83-103 years old with risk factors Additional screening may be recommended from age 54-69 based on risk factors, symptoms, and history  Vaccine Recommendations Tetanus Booster All adults every 10 years Flu Vaccine All patients 6 months and older every year COVID Vaccine All patients 12 years and older Initial dosing with  booster May recommend additional booster based on age and health history HPV Vaccine 2 doses all patients age 43-26 Dosing may be considered for patients over 26 Shingles Vaccine (Shingrix) 2 doses all adults 61 years and older Pneumonia (Pneumovax 23) All adults 47 years and older May recommend earlier dosing based on health history Pneumonia (Prevnar 66) All adults 63 years and older Dosed 1 year after Pneumovax 23  Additional Screening, Testing, and Vaccinations may be recommended on an individualized basis based on family history, health history, risk factors, and/or exposure.  __________________________________________________________  Diet Recommendations for All Patients  I recommend that all patients maintain a diet low in saturated fats, carbohydrates, and cholesterol. While this can be challenging at first, it is not impossible and small changes can make big differences.  Things to try: Decreasing the amount of soda, sweet tea, and/or juice to one or less per day and replace with water While water is always the first choice, if you do not like water you may consider adding a water additive without sugar to improve the taste other sugar free drinks Replace potatoes with a brightly colored vegetable at dinner Use healthy oils, such as canola oil or olive oil, instead of butter or hard margarine Limit your bread intake to two pieces or less a day Replace regular pasta with low carb pasta options Bake, broil, or grill foods instead of frying Monitor portion sizes  Eat smaller, more frequent meals throughout the day instead of large meals  An important thing to remember is, if you love foods that are not great for your health, you don't have to give them up completely. Instead, allow these foods to be a reward when you have done well. Allowing yourself to still have special treats every once in a while is a nice way to tell yourself thank you for working hard to keep yourself  healthy.   Also remember that every day is a new day. If you have a bad day and "fall off the wagon", you can still climb right back up and keep moving along on your journey!  We have resources available to help you!  Some websites that may be helpful include: www.http://carter.biz/  Www.VeryWellFit.com _____________________________________________________________  Activity Recommendations for All Patients  I recommend that all adults get at least 20 minutes of moderate physical activity that elevates your heart rate at least 5 days out of the week.  Some examples include: Walking or jogging at a pace that allows you to carry on a conversation Cycling (stationary bike or outdoors) Water aerobics Yoga Weight lifting Dancing If physical limitations prevent you from putting stress on your joints, exercise in a pool or seated in a chair are excellent options.  Do determine your MAXIMUM heart rate for activity: YOUR AGE - 220 =  MAX HeartRate   Remember! Do not push yourself too hard.  Start slowly and build up your pace, speed, weight, time in exercise, etc.  Allow your body to rest between exercise and get good sleep. You will need more water than normal when you are exerting yourself. Do not wait until you are thirsty to drink. Drink with a purpose of getting in at least 8, 8 ounce glasses of water a day plus more depending on how much you exercise and sweat.    If you begin to develop dizziness, chest pain, abdominal pain, jaw pain, shortness of breath, headache, vision changes, lightheadedness, or other concerning symptoms, stop the activity and allow your body to rest. If your symptoms are severe, seek emergency evaluation immediately. If your symptoms are concerning, but not severe, please let us know so that we can recommend further evaluation.   ________________________________________________________________

## 2021-01-11 NOTE — Assessment & Plan Note (Signed)
Reported right knee pain after orthoscopic surgery 7 years ago. No evidence of abnormality, weakness, or joint laxity present on exam.  Recommend x-ray imaging of the knee for further evaluation. Possible osteoarthritic component present.  Will plan for formal PT to help with knee pain and strengthening to see if this helps with his symptoms.  Discussed that at least 6 weeks of treatment is recommended prior to determining if other options are needed.  Will trial mobic 7.5mg  once a day for inflammation and pain relief.  Careful consideration with concurrent use of lithium- current lithium dosing at 300 mg per day with recent levels showing lower than therapuetic. Starting mobic at 7.5mg  unlikely to cause increased lithium levels or concerning side effects. Recommend discussing this with psychiatry at next appointment. If PT and medication management are not effective, recommend orthopedic evaluation for further recommendations.

## 2021-01-11 NOTE — Assessment & Plan Note (Signed)
Review of current and past medical history, social history, medication, and family history.  Review of care gaps and health maintenance recommendations.  Records from recent providers to be requested if not available in Chart Review or Care Everywhere.  Recommendations for health maintenance, diet, and exercise provided.   

## 2021-01-11 NOTE — Assessment & Plan Note (Signed)
Recent exacerbation with hospitalization- new medications started.  Currently under care with [facility]. His mother is active in support and assisting in his care at this time. Discussed reduced risk of TD with second generation antipsychotic medications, such as seroquel. Given that this is a new medication, he is on low doses, and he is responding well to initial treatment, I recommend that the patient continue with this medication at this time, but discuss his concerns at his upcoming appointment with psychiatry.  At this time, I feel the risks associated with TD are far less than stopping the medication.  Research shows 0.1% to 1% of patients on seroquel may develop TD, which is minimal.  He agrees to continue the medication at this time and discuss with psychiatry for their recommendations further.

## 2021-01-14 NOTE — Progress Notes (Signed)
Please call patient's mother, [name] with results: Patient agreed to allow mother to get messages.   [phone]  X-ray shows no signs of abnormality to the bone or the soft tissues. The joint spaces are within normal limits and there does not appear to be any evidence of arthritis.   Fortunately, the findings are completely normal, but this doesn't explain the pain he is experiencing.   I recommend that if physical therapy is not helpful with the pain and mobility then he follow-up with orthopedics as they can do further imaging if warranted.

## 2021-01-17 ENCOUNTER — Telehealth (HOSPITAL_BASED_OUTPATIENT_CLINIC_OR_DEPARTMENT_OTHER): Payer: Self-pay

## 2021-01-17 NOTE — Telephone Encounter (Signed)
-----   Message from Tollie Eth, NP sent at 01/14/2021  9:26 AM EDT ----- Please call patient's mother, [name] with results: Patient agreed to allow mother to get messages.   [phone]  X-ray shows no signs of abnormality to the bone or the soft tissues. The joint spaces are within normal limits and there does not appear to be any evidence of arthritis.   Fortunately, the findings are completely normal, but this doesn't explain the pain he is experiencing.   I recommend that if physical therapy is not helpful with the pain and mobility then he follow-up with orthopedics as they can do further imaging if warranted.

## 2021-01-17 NOTE — Telephone Encounter (Signed)
Per Shawna Clamp, AGNP contacted patient's mother Emerson Schreifels with x-ray result.  She is aware and understands recommendations.  She is instructed to contact the office with questions or concerns.

## 2021-01-25 ENCOUNTER — Other Ambulatory Visit: Payer: Self-pay

## 2021-01-25 ENCOUNTER — Ambulatory Visit (HOSPITAL_BASED_OUTPATIENT_CLINIC_OR_DEPARTMENT_OTHER): Payer: 59 | Attending: Nurse Practitioner | Admitting: Physical Therapy

## 2021-01-25 ENCOUNTER — Encounter (HOSPITAL_BASED_OUTPATIENT_CLINIC_OR_DEPARTMENT_OTHER): Payer: Self-pay | Admitting: Physical Therapy

## 2021-01-25 DIAGNOSIS — G8929 Other chronic pain: Secondary | ICD-10-CM | POA: Insufficient documentation

## 2021-01-25 DIAGNOSIS — R2689 Other abnormalities of gait and mobility: Secondary | ICD-10-CM | POA: Insufficient documentation

## 2021-01-25 DIAGNOSIS — R296 Repeated falls: Secondary | ICD-10-CM | POA: Insufficient documentation

## 2021-01-25 DIAGNOSIS — M25561 Pain in right knee: Secondary | ICD-10-CM | POA: Insufficient documentation

## 2021-01-26 ENCOUNTER — Encounter (HOSPITAL_BASED_OUTPATIENT_CLINIC_OR_DEPARTMENT_OTHER): Payer: Self-pay | Admitting: Physical Therapy

## 2021-01-28 ENCOUNTER — Ambulatory Visit (HOSPITAL_BASED_OUTPATIENT_CLINIC_OR_DEPARTMENT_OTHER): Payer: 59 | Admitting: Physical Therapy

## 2021-01-28 ENCOUNTER — Other Ambulatory Visit: Payer: Self-pay

## 2021-01-28 DIAGNOSIS — R2689 Other abnormalities of gait and mobility: Secondary | ICD-10-CM

## 2021-01-28 DIAGNOSIS — G8929 Other chronic pain: Secondary | ICD-10-CM

## 2021-01-28 DIAGNOSIS — M25561 Pain in right knee: Secondary | ICD-10-CM | POA: Diagnosis not present

## 2021-01-28 DIAGNOSIS — R296 Repeated falls: Secondary | ICD-10-CM

## 2021-01-30 ENCOUNTER — Encounter (HOSPITAL_BASED_OUTPATIENT_CLINIC_OR_DEPARTMENT_OTHER): Payer: Self-pay | Admitting: Physical Therapy

## 2021-01-30 NOTE — Therapy (Signed)
[facility] [address] [place], [place], [address] Phone: [phone]   Fax:  [phone]  Physical Therapy Treatment  Patient Details  Name: Clinton Fisher MRN: [id number] Date of Birth: 25-Jun-1991 Referring Provider (PT): Georgeanne Nim NP   Encounter Date: 01/28/2021  Subjective Reports Normal right knee pain. Hoping the aquatic therapy will help. 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   Manic affective disorder with recurrent episode (HCC)    Seasonal allergies    sore throat 07/29/2013    Past Surgical History:  Procedure Laterality Date   FOOT FOREIGN BODY REMOVAL Right 01/19/2011   KNEE ARTHROSCOPY Right 08/01/2013   Procedure: ARTHROSCOPY RIGHT KNEE, Chondroplasty, Excision of Plica;  Surgeon: Harvie Junior, MD;  Location: [facility];  Service: Orthopedics;  Laterality: Right;    There were no vitals filed for this visit.  Pt seen for aquatic therapy today.  Treatment took place in water 3.25-4.8 ft in depth at the [facility] pool. Temp of water was 91.  Pt entered/exited the pool via stairs step to pattern independently with bilat rail.  Warm up: forward, backward and side stepping/walking cues for increased step length, increased speed, hand placement to increase resistance.  Seated Stretching: gastroc, hamstrings, glutes, IR/ER and LB sitting on bench. Knee flex/ext completed 2 x 10 with ankle buoy cuffs cuing for increased speed to toleration. Bicycling seated on edge of bench cued for tight abdominals and glutes  intervals of 20 secs x 3  Standing Sacrospinal stretching pt stabilized by PT 3 x 30 sec immersed to chest. Knees to opposite shoulder 3 x 30 seconds Hamstring stretch using noodle 3 x 30 sec Kick downs  then jack knife x 10 reps each Aerobic capacity challenges walking/fast paced knees high with long step 4 lengths of pool. Add/abd and hip flex/ext with ankle buoys 2 x 10  Cuing for contratced abdominals and glutes Hip flex stretching 3 x 30 secs     Pt requires buoyancy for support and to offload joints with strengthening exercises. Viscosity of the water is needed for resistance of strengthening; water current perturbations provides challenge to standing balance unsupported, requiring increased core activation.                               PT Short Term Goals - 01/26/21 1312       PT SHORT TERM GOAL #1   Title Patient will report reduced pain to a 3/10 for knee pain.    Baseline 5/10    Time 3    Period Weeks    Status New    Target Date 02/16/21      PT SHORT TERM GOAL #2   Title Patient will increase bilateral hip flexion strength to 4+/5    Baseline 3+/5    Time 3    Period Weeks    Status New    Target Date 02/16/21               PT Long Term Goals -  01/26/21 1316       PT LONG TERM GOAL #1   Title Patient will increase strength of bilateral hip 5/5 throughout to improve endurance and decrease risk of falls during ADL's.    Baseline 4/5    Time 6    Period Weeks    Status New    Target Date 03/09/21      PT LONG TERM GOAL #2   Title Patient will improve gait mechanics in order to ambulate 1000 feet with no fatigue or pain in right knee.    Baseline slow cadence, Trendelenburg gait, decreased hip flexion during swing phase    Time 6    Period Weeks    Status New    Target Date 03/09/21      PT LONG TERM GOAL #3   Title Patient will have full understanding of final HEP plan to perform thera-ex at home without supervision  to be able to go to work.    Baseline initial HEP    Time 6    Period Weeks    Status New    Target Date 03/09/21            Clinical Assessment Pt comfortable in aquatic setting. He puts forth good effort. PT directs pt through a variety of stretches, strengthening and balance challenges with focus on identifying comfort and ability in setting. Hamstring and LB muscle tightness evident although are easily stretched. He does not have access to a pool so is ecnouraged to complete HEP as per land PT. Pt will progress well in this setting        Patient will benefit from skilled therapeutic intervention in order to improve the following deficits and impairments:  Abnormal gait, Decreased endurance, Decreased strength, Difficulty walking, Decreased balance, Decreased mobility, Increased fascial restricitons, Increased muscle spasms, Pain, Impaired perceived functional ability, Decreased coordination, Postural dysfunction, Improper body mechanics  Visit Diagnosis: Chronic pain of right knee  Other abnormalities of gait and mobility  Repeated falls     Problem List Patient Active Problem List   Diagnosis Date Noted   Chronic pain of right knee 01/11/2021   Encounter for medical examination to establish care 01/11/2021   Bipolar 1 disorder (HCC) 10/18/2018   Cannabis dependence, continuous (HCC) 10/18/2018   Tobacco use disorder 08/20/2017   GAD (generalized anxiety disorder) 09/25/2016    Jeanmarie Hubert  PT 02/01/2021, 10:51 AM  [facility] [address] [place], [place], [address] Phone: [phone]   Fax:  [phone]  Name: Clinton Fisher MRN: [id number] Date of Birth: [birth year]

## 2021-02-01 NOTE — Therapy (Signed)
[facility] [address] [place], [place], [address] Phone: [phone]   Fax:  [phone]  Physical Therapy Evaluation  Patient Details  Name: Clinton Fisher MRN: [id number] Date of Birth: May 02, 1991 Referring Provider (PT): Georgeanne Nim NP   Encounter Date: 01/25/2021   PT End of Session - 02/01/21 1611     Visit Number 1    Number of Visits 6    Date for PT Re-Evaluation 03/09/21    Authorization Type [employer] 2022    PT Start Time 1123    PT Stop Time 1205    PT Time Calculation (min) 42 min    Activity Tolerance Patient tolerated treatment well    Behavior During Therapy [facility] for tasks assessed/performed             Past Medical History:  Diagnosis Date   Abrasion of index finger 07/29/2013   left   Bipolar I disorder, current or most recent episode manic, severe (HCC) 12/14/2020   Bipolar I disorder, current or most recent episode manic, with psychotic features (HCC) 12/14/2020   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   Manic affective disorder with recurrent episode (HCC)    Seasonal allergies    sore throat 07/29/2013    Past Surgical History:  Procedure Laterality Date   FOOT FOREIGN BODY REMOVAL Right 01/19/2011   KNEE ARTHROSCOPY Right 08/01/2013   Procedure: ARTHROSCOPY RIGHT KNEE, Chondroplasty, Excision of Plica;  Surgeon: Harvie Junior, MD;  Location: [facility];  Service: Orthopedics;  Laterality: Right;    There were no vitals filed for this visit.    Subjective Assessment - 02/01/21 1519     Subjective Patient reports chronic right knee pain. He states that this began 7 years ago after he had arthroscopic surgery with no PT after surgery. He states that he can stand all day if needed but is in pain. He states that his pain does not allow him to find a job that requires walking and standing due to pain.  Patient also states that he has frequent falls from pain but manages to catch him self with his UE. Patient denies numbness, radaiting pain, but does feel like his knee gives out.    Patient is accompained by: Family member   mom   Pertinent History orthoscoptic surgery for overworked 8 years ago, X-ray no OA.    Limitations Lifting;Standing;Walking    How long can you sit comfortably? n/a    How long can you stand comfortably? all day but limited    How long can you walk comfortably? 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 Yes    Pain Score 5     Pain Location Knee    Pain Orientation Right    Pain Descriptors / Indicators Aching    Pain Type Chronic pain    Pain Onset More than a month ago    Pain Frequency Constant    Aggravating Factors  standing and walking    Pain Relieving Factors medication and rest    Effect of Pain on Daily Activities can not work                [facility] PT Assessment - 02/01/21 0001       Assessment   Medical Diagnosis  chronic right knee pain    Referring Provider (PT) Georgeanne Nim NP    Next MD Visit no official visit day.    Prior Therapy no      Precautions   Precautions None      Restrictions   Weight Bearing Restrictions No      Balance Screen   Has the patient fallen in the past 6 months Yes    How many times? 10    Has the patient had a decrease in activity level because of a fear of falling?  Yes    Is the patient reluctant to leave their home because of a fear of falling?  No      Home [employer]ist information centre manager residence    Living Arrangements Parent    Available Help at Discharge Family    Type of Home House    Home Access Stairs to enter    Entrance Stairs-Number of Steps 3    Home Layout One level    Home Equipment [place] - single point      Prior Function   Level of Independence Independent    Vocation Unemployed     Leisure help around house, wants to work      Cognition   Overall Cognitive Status Within Functional Limits for tasks assessed    Memory Appears intact    Awareness Appears intact    Problem Solving Appears intact      Observation/Other Assessments-Edema    Edema --   none     Sensation   Light Touch Appears Intact      Coordination   Gross Motor Movements are Fluid and Coordinated Yes    Fine Motor Movements are Fluid and Coordinated Yes      Posture/Postural Control   Posture Comments forward head posture      AROM   Overall AROM  Within functional limits for tasks performed      PROM   Overall PROM  Within functional limits for tasks performed      Strength   Overall Strength Deficits      Palpation   Palpation comment tender to palpation on right pes anserine and medial/lateral joint line of right knee      Special Tests    Special Tests Knee Special Tests    Knee Special tests  other      other    Side  Right    Comments No LCL laxity, MCL laxity, ACL laxity or PCL laxity (pain with posterior drawer), negative mccurays      Ambulation/Gait   Ambulation/Gait Yes    Ambulation/Gait Assistance 7: Independent    Gait Comments decreased hip flexion during gait, trendelenburg, decreased cadence, small steps                        Objective measurements completed on examination: See above findings.               PT Education - 02/01/21 1611     Education Details reviewed benefits of movement; increased strengthening    Person(s) Educated Patient    Methods Explanation;Demonstration;Tactile cues;Verbal cues    Comprehension Verbalized understanding;Returned demonstration;Verbal cues required;Tactile cues required              PT Short Term Goals - 02/01/21 1612       PT SHORT TERM GOAL #1   Title Patient will report reduced pain to a 3/10 for knee  pain.    Baseline 5/10    Time 3    Period Weeks    Status New    Target  Date 02/16/21      PT SHORT TERM GOAL #2   Title Patient will increase bilateral hip flexion strength to 4+/5    Baseline 3+/5    Time 3    Period Weeks    Status New    Target Date 02/16/21               PT Long Term Goals - 02/01/21 1612       PT LONG TERM GOAL #1   Title Patient will increase strength of bilateral hip 5/5 throughout to improve endurance and decrease risk of falls during ADL's.    Baseline 4/5    Time 6    Period Weeks    Status New      PT LONG TERM GOAL #2   Title Patient will improve gait mechanics in order to ambulate 1000 feet with no fatigue or pain in right knee.    Baseline slow cadence, Trendelenburg gait, decreased hip flexion during swing phase    Time 6    Period Weeks    Status New      PT LONG TERM GOAL #3   Title Patient will have full understanding of final HEP plan to perform thera-ex at home without supervision to be able to go to work.    Baseline initial HEP    Time 6    Period Weeks    Status New                    Plan - 02/01/21 1612     Clinical Impression Statement Patient presents with right knee pain and lower extremity weakness. Patient had orthoscopic surgery 7 years ago that was followed by decreased activity due to pain. Current X-rays show no osteoathritis of knee. No joint laxity or evidence of ligamentus damage. Patient presents with decrease step length, decreased hip flexion during swing phase, and overall abnormalites of gait from weakness in LE that has caused him to be prone to falls. Patient tolerated treatment well and is eager to begin therapy with interest in aquatics as well.    Personal Factors and Comorbidities Comorbidity 2;Time since onset of injury/illness/exacerbation;Fitness    Comorbidities chronic pain, GAD, Bipolar 1    Examination-Activity Limitations Stairs;Squat;Stand;Lift;Transfers    Examination-Participation Restrictions Nurse, adultCommunity Activity;Cleaning;Occupation;Volunteer;Yard Work     Stability/Clinical Decision Making Stable/Uncomplicated    Rehab Potential Good    PT Frequency 2x / week   1-2 times a week   PT Duration 6 weeks    PT Treatment/Interventions Aquatic Therapy;ADLs/Self Care Home Management;Electrical Stimulation;Cryotherapy;Iontophoresis 4mg /ml Dexamethasone;Gait training;Stair training;Contrast Bath;Ultrasound;Therapeutic activities;Therapeutic exercise;Balance training;Neuromuscular re-education;Patient/family education;Functional mobility training;Passive range of motion;Manual techniques;Taping;Vasopneumatic Device;Joint Manipulations    PT Next Visit Plan Implement hip and knee strength focused HEP, work on gait abnormalites (focus on increasing hip flexion, use of calves during push off), implement stretching exercises, aquatic therapy to improve balance and strength in LE to reduce risk of falls    PT Home Exercise Plan Quad sets 2x10, supine band clam shells 2x10 (red), short arc quads 2x10. twice a day.    Consulted and Agree with Plan of Care Patient;Family member/caregiver    Family Member Consulted Mother             Patient will benefit from skilled therapeutic intervention in order to improve the following deficits and impairments:  Abnormal gait, Decreased endurance, Decreased strength, Difficulty walking, Decreased balance, Decreased mobility, Increased fascial restricitons, Increased muscle spasms, Pain, Impaired perceived functional ability, Decreased coordination, Postural dysfunction, Improper body mechanics  Visit Diagnosis: Chronic pain of right knee  Other abnormalities of gait and mobility  Repeated falls     Problem List Patient Active Problem List   Diagnosis Date Noted   Chronic pain of right knee 01/11/2021   Encounter for medical examination to establish care 01/11/2021   Bipolar 1 disorder (HCC) 10/18/2018   Cannabis dependence, continuous (HCC) 10/18/2018   Tobacco use disorder 08/20/2017   GAD (generalized  anxiety disorder) 09/25/2016    Dessie Coma PT DPT  02/01/2021, 4:14 PM  [facility] [address] [place], [place], [address] Phone: [phone]   Fax:  [phone]  Name: Clinton Fisher MRN: [id number] Date of Birth: 09/11/1990

## 2021-02-02 ENCOUNTER — Ambulatory Visit (HOSPITAL_BASED_OUTPATIENT_CLINIC_OR_DEPARTMENT_OTHER): Payer: 59 | Admitting: Physical Therapy

## 2021-02-02 ENCOUNTER — Other Ambulatory Visit: Payer: Self-pay

## 2021-02-02 ENCOUNTER — Encounter (HOSPITAL_BASED_OUTPATIENT_CLINIC_OR_DEPARTMENT_OTHER): Payer: Self-pay | Admitting: Physical Therapy

## 2021-02-02 DIAGNOSIS — R2689 Other abnormalities of gait and mobility: Secondary | ICD-10-CM

## 2021-02-02 DIAGNOSIS — G8929 Other chronic pain: Secondary | ICD-10-CM

## 2021-02-02 DIAGNOSIS — M25561 Pain in right knee: Secondary | ICD-10-CM | POA: Diagnosis not present

## 2021-02-02 DIAGNOSIS — R296 Repeated falls: Secondary | ICD-10-CM

## 2021-02-02 NOTE — Therapy (Signed)
[facility] [address] [place], [place], [address] Phone: [phone]   Fax:  [phone]  Physical Therapy Treatment  Patient Details  Name: Clinton Fisher MRN: [id number] Date of Birth: 12-Apr-1991 Referring Provider (PT): Georgeanne Nim NP   Encounter Date: 02/02/2021   PT End of Session - 02/02/21 1450     Visit Number 3    Number of Visits 6    Date for PT Re-Evaluation 03/09/21    Authorization Type [employer] 2022    PT Start Time 1430    PT Stop Time 1513    PT Time Calculation (min) 43 min    Activity Tolerance Patient tolerated treatment well    Behavior During Therapy [facility] for tasks assessed/performed             Past Medical History:  Diagnosis Date   Abrasion of index finger 07/29/2013   left   Bipolar I disorder, current or most recent episode manic, severe (HCC) 12/14/2020   Bipolar I disorder, current or most recent episode manic, with psychotic features (HCC) 12/14/2020   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   Manic affective disorder with recurrent episode (HCC)    Seasonal allergies    sore throat 07/29/2013    Past Surgical History:  Procedure Laterality Date   FOOT FOREIGN BODY REMOVAL Right 01/19/2011   KNEE ARTHROSCOPY Right 08/01/2013   Procedure: ARTHROSCOPY RIGHT KNEE, Chondroplasty, Excision of Plica;  Surgeon: Harvie Junior, MD;  Location: [facility];  Service: Orthopedics;  Laterality: Right;    There were no vitals filed for this visit.   Subjective Assessment - 02/02/21 1432     Subjective Patient reports right knee pain. He states he is still having some balance issues but overall feels he is getting stronger.    Pertinent History orthoscoptic surgery for overworked 8 years ago, X-ray no OA.    Limitations Lifting;Standing;Walking    How long can you sit comfortably? n/a    How long can you  stand comfortably? all day but limited    How long can you walk comfortably? 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 Yes    Pain Score 5     Pain Location Knee    Pain Orientation Right    Pain Descriptors / Indicators Aching    Pain Type Chronic pain    Pain Onset More than a month ago    Pain Frequency Constant    Aggravating Factors  standing and walking    Pain Relieving Factors medication and rest    Effect of Pain on Daily Activities cannot work    Multiple Pain Sites No                               [facility] Adult PT Treatment/Exercise - 02/02/21 0001       Lumbar Exercises: Aerobic   Nustep 5 mins, level 2      Lumbar Exercises: Standing   Other Standing Lumbar Exercises single leg weighted shift 2x10    Other Standing Lumbar Exercises step up with added balance 2x10      Lumbar Exercises: Supine   Clam 10 reps    Clam Limitations 2 sets with red band  Bridge 10 reps    Bridge Limitations 2 sets by 10 with red band      Knee/Hip Exercises: Supine   Quad Sets AROM    Quad Sets Limitations with ball 2x10    Short Arc Quad Sets AROM    Short Arc Quad Sets Limitations 2x10      Manual Therapy   Manual Therapy Taping    Manual therapy comments mcconeel taping for patella, lateral to medial taping                    PT Education - 02/02/21 1709     Education Details reveiwed updated HEP and emphasized importance of stretching    Person(s) Educated Patient    Methods Explanation;Demonstration;Tactile cues;Verbal cues    Comprehension Verbalized understanding;Returned demonstration;Verbal cues required;Tactile cues required              PT Short Term Goals - 02/01/21 1612       PT SHORT TERM GOAL #1   Title Patient will report reduced pain to a 3/10 for knee pain.    Baseline 5/10    Time 3    Period Weeks    Status New    Target  Date 02/16/21      PT SHORT TERM GOAL #2   Title Patient will increase bilateral hip flexion strength to 4+/5    Baseline 3+/5    Time 3    Period Weeks    Status New    Target Date 02/16/21               PT Long Term Goals - 02/01/21 1612       PT LONG TERM GOAL #1   Title Patient will increase strength of bilateral hip 5/5 throughout to improve endurance and decrease risk of falls during ADL's.    Baseline 4/5    Time 6    Period Weeks    Status New      PT LONG TERM GOAL #2   Title Patient will improve gait mechanics in order to ambulate 1000 feet with no fatigue or pain in right knee.    Baseline slow cadence, Trendelenburg gait, decreased hip flexion during swing phase    Time 6    Period Weeks    Status New      PT LONG TERM GOAL #3   Title Patient will have full understanding of final HEP plan to perform thera-ex at home without supervision to be able to go to work.    Baseline initial HEP    Time 6    Period Weeks    Status New                   Plan - 02/02/21 1444     Clinical Impression Statement Patient continues to progress in strength and balance. Patient tolerated therapy well with no increase in pain or discomfort. Patient was educated on putting on [place] on self as patient. Patient should continue to progress as tolerated and continue to progress HEP in land.    Personal Factors and Comorbidities Comorbidity 2;Time since onset of injury/illness/exacerbation;Fitness    Comorbidities chronic pain, GAD, Bipolar 1    Examination-Activity Limitations Stairs;Squat;Stand;Lift;Transfers    Examination-Participation Restrictions Nurse, adult;Yard Work    Stability/Clinical Decision Making Stable/Uncomplicated    Clinical Decision Making Low    Rehab Potential Good    PT Frequency 2x / week    PT Duration 6 weeks  PT Treatment/Interventions Aquatic Therapy;ADLs/Self Care Home Management;Electrical  Stimulation;Cryotherapy;Iontophoresis 4mg /ml Dexamethasone;Gait training;Stair training;Contrast Bath;Ultrasound;Therapeutic activities;Therapeutic exercise;Balance training;Neuromuscular re-education;Patient/family education;Functional mobility training;Passive range of motion;Manual techniques;Taping;Vasopneumatic Device;Joint Manipulations    PT Next Visit Plan Implement hip and knee strength focused HEP, work on gait abnormalites (focus on increasing hip flexion, use of calves during push off), implement stretching exercises, aquatic therapy to improve balance and strength in LE to reduce risk of falls    PT Home Exercise Plan Access Code: [email or web address]  URL: [email or web address]  Date: 02/02/2021  Prepared by: 02/04/2021    Exercises  Supine Quad Set - 1 x daily - 7 x weekly - 3 sets - 10 reps  Supine Knee Extension Strengthening - 1 x daily - 7 x weekly - 3 sets - 10 reps  Hooklying Clamshell with Resistance - 1 x daily - 7 x weekly - 3 sets - 10 reps  Supine Bridge with Resistance Band - 1 x daily - 7 x weekly - 3 sets - 10 reps  Standing Hamstring Stretch with Step - 1 x daily - 7 x weekly - 3 sets - 10 reps  Supine Gluteus Stretch - 1 x daily - 7 x weekly - 3 sets - 10 reps  Lorayne Bender with Strap - 1 x daily - 7 x weekly - 3 sets - 10 reps    Consulted and Agree with Plan of Care Patient             Patient will benefit from skilled therapeutic intervention in order to improve the following deficits and impairments:  Abnormal gait, Decreased endurance, Decreased strength, Difficulty walking, Decreased balance, Decreased mobility, Increased fascial restricitons, Increased muscle spasms, Pain, Impaired perceived functional ability, Decreased coordination, Postural dysfunction, Improper body mechanics  Visit Diagnosis: Chronic pain of right knee  Other abnormalities of gait and mobility  Repeated falls     Problem List Patient Active Problem List   Diagnosis Date Noted    Chronic pain of right knee 01/11/2021   Encounter for medical examination to establish care 01/11/2021   Bipolar 1 disorder (HCC) 10/18/2018   Cannabis dependence, continuous (HCC) 10/18/2018   Tobacco use disorder 08/20/2017   GAD (generalized anxiety disorder) 09/25/2016    11/25/2016 PT DPT  02/02/2021  02/04/2021 SPT 02/02/2021, 5:11 PM  During this treatment session, the therapist was present, participating in and directing the treatment.     [facility] [address] [place], [place], [place] Phone: [phone]   Fax:  [phone]  Name: Clinton Fisher MRN: [id number] [id number] Date of Birth: 1991/07/19

## 2021-02-04 ENCOUNTER — Ambulatory Visit (HOSPITAL_BASED_OUTPATIENT_CLINIC_OR_DEPARTMENT_OTHER): Payer: 59 | Admitting: Physical Therapy

## 2021-02-04 ENCOUNTER — Other Ambulatory Visit: Payer: Self-pay

## 2021-02-04 ENCOUNTER — Encounter (HOSPITAL_BASED_OUTPATIENT_CLINIC_OR_DEPARTMENT_OTHER): Payer: Self-pay | Admitting: Physical Therapy

## 2021-02-04 DIAGNOSIS — M25561 Pain in right knee: Secondary | ICD-10-CM | POA: Diagnosis not present

## 2021-02-04 DIAGNOSIS — R296 Repeated falls: Secondary | ICD-10-CM

## 2021-02-04 DIAGNOSIS — G8929 Other chronic pain: Secondary | ICD-10-CM

## 2021-02-04 DIAGNOSIS — R2689 Other abnormalities of gait and mobility: Secondary | ICD-10-CM

## 2021-02-07 ENCOUNTER — Encounter (HOSPITAL_BASED_OUTPATIENT_CLINIC_OR_DEPARTMENT_OTHER): Payer: Self-pay | Admitting: Physical Therapy

## 2021-02-07 NOTE — Therapy (Signed)
[facility] [address] [place], [place], [address] Phone: [phone]   Fax:  [phone]  Physical Therapy Treatment  Patient Details  Name: KHALEEM BURCHILL MRN: [id number] Date of Birth: May 17, 1991 Referring Provider (PT): Georgeanne Nim NP   Encounter Date: 02/04/2021   PT End of Session - 02/07/21 1255     Visit Number 4    Number of Visits 6    Date for PT Re-Evaluation 03/09/21    Authorization Type [employer] 2022    PT Start Time 1430    PT Stop Time 1512    PT Time Calculation (min) 42 min    Equipment Utilized During Treatment Other (comment)   aquatic step, water board, dumbbells, water bar   Activity Tolerance Patient tolerated treatment well    Behavior During Therapy 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   Manic affective disorder with recurrent episode (HCC)    Seasonal allergies    sore throat 07/29/2013    Past Surgical History:  Procedure Laterality Date   FOOT FOREIGN BODY REMOVAL Right 01/19/2011   KNEE ARTHROSCOPY Right 08/01/2013   Procedure: ARTHROSCOPY RIGHT KNEE, Chondroplasty, Excision of Plica;  Surgeon: Harvie Junior, MD;  Location: [facility];  Service: Orthopedics;  Laterality: Right;    There were no vitals filed for this visit.   Subjective Assessment - 02/07/21 1258     Subjective Patient reports right knee pain is getting better. Patient states that he is getting stronger. Patient states the mcconell tape works but its difficult to take off and that he will use it when needed.  Overall, he states that he is enjoying physical therapy and likes the progress he is making.    Patient is accompained by: Family member    Pertinent History orthoscoptic surgery for overworked 8 years ago, X-ray no OA.    Limitations Lifting;Standing;Walking    How long can you sit comfortably? n/a    How long can you stand comfortably? all day but limited    How long can you walk comfortably? 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 Yes    Pain Score 4     Pain Location Knee    Pain Orientation Right    Pain Descriptors / Indicators Aching    Pain Type Chronic pain    Pain Onset More than a month ago    Pain Frequency Intermittent    Aggravating Factors  standing and walking    Pain Relieving Factors medication and rest    Effect of Pain on Daily Activities can not work    Multiple Pain Sites No              education: patient educated on technique with there-ex. Patient verbalized understanding.                            PT  Short Term Goals - 02/04/21 1536       PT SHORT TERM GOAL #1   Title --    Baseline --    Time --    Period --    Status --    Target Date --      PT SHORT TERM GOAL #2   Title --    Baseline --    Time --    Period --    Status --    Target Date --               PT Long Term Goals - 02/01/21 1612       PT LONG TERM GOAL #1   Title Patient will increase strength of bilateral hip 5/5 throughout to improve endurance and decrease risk of falls during ADL's.    Baseline 4/5    Time 6    Period Weeks    Status New      PT LONG TERM GOAL #2   Title Patient will improve gait mechanics in order to ambulate 1000 feet with no fatigue or pain in right knee.    Baseline slow cadence, Trendelenburg gait, decreased hip flexion during swing phase    Time 6    Period Weeks    Status New      PT LONG TERM GOAL #3   Title  Patient will have full understanding of final HEP plan to perform thera-ex at home without supervision to be able to go to work.    Baseline initial HEP    Time 6    Period Weeks    Status New                   Plan - 02/07/21 1256     Clinical Impression Statement Patient continues to progress in strength and balance of lower extremity. Patient was able to tolerated aquatic therapy without an increase in pain. He was able to perform higher intensity exercises on water with no discomfort. Patient should continue to progress land HEP and aquatics as tolerated.    Personal Factors and Comorbidities Comorbidity 2;Time since onset of injury/illness/exacerbation;Fitness    Comorbidities chronic pain, GAD, Bipolar 1    Examination-Activity Limitations Stairs;Squat;Stand;Lift;Transfers    Examination-Participation Restrictions Nurse, adult;Yard Work    Stability/Clinical Decision Making Stable/Uncomplicated    Rehab Potential Good    PT Frequency 2x / week    PT Duration 6 weeks    PT Treatment/Interventions Aquatic Therapy;ADLs/Self Care Home Management;Electrical Stimulation;Cryotherapy;Iontophoresis 4mg /ml Dexamethasone;Gait training;Stair training;Contrast Bath;Ultrasound;Therapeutic activities;Therapeutic exercise;Balance training;Neuromuscular re-education;Patient/family education;Functional mobility training;Passive range of motion;Manual techniques;Taping;Vasopneumatic Device;Joint Manipulations    PT Next Visit Plan Implement hip and knee strength focused HEP, work on gait abnormalites (focus on increasing hip flexion, use of calves during push off), implement stretching exercises, aquatic therapy to improve balance and strength in LE to reduce risk of falls. Sit to stands and more functional exercies.    PT Home Exercise Plan Access Code: [email or web address]  URL: [email or web address]  Date: 02/02/2021  Prepared by: 02/04/2021    Exercises   Supine Quad Set - 1 x daily - 7 x weekly - 3 sets - 10 reps  Supine Knee Extension Strengthening - 1 x daily - 7 x weekly - 3 sets - 10 reps  Hooklying Clamshell with Resistance - 1 x daily - 7 x weekly - 3 sets - 10 reps  Supine Bridge with Resistance Band - 1 x daily - 7 x weekly - 3  sets - 10 reps  Standing Hamstring Stretch with Step - 1 x daily - 7 x weekly - 3 sets - 10 reps  Supine Gluteus Stretch - 1 x daily - 7 x weekly - 3 sets - 10 reps  Erby Pian with Strap - 1 x daily - 7 x weekly - 3 sets - 10 reps    Consulted and Agree with Plan of Care Patient             Patient will benefit from skilled therapeutic intervention in order to improve the following deficits and impairments:  Abnormal gait, Decreased endurance, Decreased strength, Difficulty walking, Decreased balance, Decreased mobility, Increased fascial restricitons, Increased muscle spasms, Pain, Impaired perceived functional ability, Decreased coordination, Postural dysfunction, Improper body mechanics  Visit Diagnosis: Chronic pain of right knee  Other abnormalities of gait and mobility  Repeated falls     Problem List Patient Active Problem List   Diagnosis Date Noted   Chronic pain of right knee 01/11/2021   Encounter for medical examination to establish care 01/11/2021   Bipolar 1 disorder (HCC) 10/18/2018   Cannabis dependence, continuous (HCC) 10/18/2018   Tobacco use disorder 08/20/2017   GAD (generalized anxiety disorder) 09/25/2016    Dessie Coma PT DPT  02/07/2021, 12:59 PM  [facility] [address] [place], [place], [address] Phone: [phone]   Fax:  [phone]  Name: KRISH BAILLY MRN: [id number] Date of Birth: 10/20/1990

## 2021-02-08 ENCOUNTER — Ambulatory Visit (HOSPITAL_BASED_OUTPATIENT_CLINIC_OR_DEPARTMENT_OTHER): Payer: 59 | Admitting: Physical Therapy

## 2021-02-08 ENCOUNTER — Encounter (HOSPITAL_BASED_OUTPATIENT_CLINIC_OR_DEPARTMENT_OTHER): Payer: Self-pay | Admitting: Physical Therapy

## 2021-02-08 ENCOUNTER — Other Ambulatory Visit: Payer: Self-pay

## 2021-02-08 DIAGNOSIS — M25561 Pain in right knee: Secondary | ICD-10-CM | POA: Diagnosis not present

## 2021-02-08 DIAGNOSIS — R296 Repeated falls: Secondary | ICD-10-CM

## 2021-02-08 DIAGNOSIS — R2689 Other abnormalities of gait and mobility: Secondary | ICD-10-CM

## 2021-02-08 DIAGNOSIS — G8929 Other chronic pain: Secondary | ICD-10-CM

## 2021-02-09 ENCOUNTER — Encounter (HOSPITAL_BASED_OUTPATIENT_CLINIC_OR_DEPARTMENT_OTHER): Payer: Self-pay | Admitting: Physical Therapy

## 2021-02-09 NOTE — Therapy (Signed)
[facility] [address] [place], [place], [address] Phone: [phone]   Fax:  [phone]  Physical Therapy Treatment  Patient Details  Name: Clinton Fisher MRN: [id number] Date of Birth: Jul 15, 1991 Referring Provider (PT): Georgeanne Nim NP   Encounter Date: 02/08/2021   PT End of Session - 02/08/21 1451     Visit Number 5    Number of Visits 6    Date for PT Re-Evaluation 03/09/21    Authorization Type [employer] 2022    PT Start Time 1435   Patient was stuck in traffic   PT Stop Time 1515    PT Time Calculation (min) 40 min    Activity Tolerance Patient tolerated treatment well    Behavior During Therapy [facility] for tasks assessed/performed             Past Medical History:  Diagnosis Date   Abrasion of index finger 07/29/2013   left   Bipolar I disorder, current or most recent episode manic, severe (HCC) 12/14/2020   Bipolar I disorder, current or most recent episode manic, with psychotic features (HCC) 12/14/2020   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   Manic affective disorder with recurrent episode (HCC)    Seasonal allergies    sore throat 07/29/2013    Past Surgical History:  Procedure Laterality Date   FOOT FOREIGN BODY REMOVAL Right 01/19/2011   KNEE ARTHROSCOPY Right 08/01/2013   Procedure: ARTHROSCOPY RIGHT KNEE, Chondroplasty, Excision of Plica;  Surgeon: Harvie Junior, MD;  Location: [facility];  Service: Orthopedics;  Laterality: Right;    There were no vitals filed for this visit.   Subjective Assessment - 02/08/21 1441     Subjective Patient reports his knee has been pretty good. He was able to mow over the weekend without much pain. He reports mild pain on the tibal plateau area.    Patient is accompained by: Family member    Pertinent History orthoscoptic surgery for overworked 8 years ago, X-ray no OA.     Limitations Lifting;Standing;Walking    How long can you sit comfortably? n/a    How long can you stand comfortably? all day but limited    How long can you walk comfortably? 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 No/denies                               [facility] Adult PT Treatment/Exercise - 02/09/21 0001       Knee/Hip Exercises: Standing   Forward Step Up 2 sets;15 reps;Hand Hold: 2    Other Standing Knee Exercises slow march 2x10; heel raise x20    Other Standing Knee Exercises step and hold onto air-ex x15 fwd and lateral;      Knee/Hip Exercises: Supine   Quad Sets AROM    Straight Leg Raises Limitations 3x10      Manual Therapy   Manual Therapy Taping    Manual therapy comments mcconel taping for patella, lateral to medial taping                    PT Education - 02/08/21 1450     Education Details HEP and symptom mangement    Person(s) Educated Patient  Methods Demonstration;Tactile cues;Explanation;Verbal cues    Comprehension Verbalized understanding;Returned demonstration;Verbal cues required;Tactile cues required              PT Short Term Goals - 02/04/21 1536       PT SHORT TERM GOAL #1   Title --    Baseline --    Time --    Period --    Status --    Target Date --      PT SHORT TERM GOAL #2   Title --    Baseline --    Time --    Period --    Status --    Target Date --               PT Long Term Goals - 02/01/21 1612       PT LONG TERM GOAL #1   Title Patient will increase strength of bilateral hip 5/5 throughout to improve endurance and decrease risk of falls during ADL's.    Baseline 4/5    Time 6    Period Weeks    Status New      PT LONG TERM GOAL #2   Title Patient will improve gait mechanics in order to ambulate 1000 feet with no fatigue or pain in right knee.    Baseline slow cadence, Trendelenburg  gait, decreased hip flexion during swing phase    Time 6    Period Weeks    Status New      PT LONG TERM GOAL #3   Title Patient will have full understanding of final HEP plan to perform thera-ex at home without supervision to be able to go to work.    Baseline initial HEP    Time 6    Period Weeks    Status New                   Plan - 02/08/21 1453     Clinical Impression Statement Patient is doing well. He tolerated ther-ex well. He reports he will stat doing straight leg raises more at home. Therapy added some instability work today. He had no signiifcant increase in pain. He continues to work on his exercsies    Personal Factors and Comorbidities Comorbidity 2;Time since onset of injury/illness/exacerbation;Fitness    Comorbidities chronic pain, GAD, Bipolar 1    Examination-Activity Limitations Stairs;Squat;Stand;Lift;Transfers    Examination-Participation Restrictions Nurse, adult;Yard Work    Stability/Clinical Decision Making Stable/Uncomplicated    Clinical Decision Making Low    Rehab Potential Good    PT Frequency 2x / week    PT Duration 6 weeks    PT Treatment/Interventions Aquatic Therapy;ADLs/Self Care Home Management;Electrical Stimulation;Cryotherapy;Iontophoresis 4mg /ml Dexamethasone;Gait training;Stair training;Contrast Bath;Ultrasound;Therapeutic activities;Therapeutic exercise;Balance training;Neuromuscular re-education;Patient/family education;Functional mobility training;Passive range of motion;Manual techniques;Taping;Vasopneumatic Device;Joint Manipulations    PT Next Visit Plan Implement hip and knee strength focused HEP, work on gait abnormalites (focus on increasing hip flexion, use of calves during push off), implement stretching exercises, aquatic therapy to improve balance and strength in LE to reduce risk of falls. Sit to stands and more functional exercies.    PT Home Exercise Plan Access Code: [email or web address]  URL:  [email or web address]  Date: 02/02/2021  Prepared by: 02/04/2021    Exercises  Supine Quad Set - 1 x daily - 7 x weekly - 3 sets - 10 reps  Supine Knee Extension Strengthening - 1 x daily - 7 x weekly - 3 sets - 10 reps  Hooklying Clamshell with Resistance -  1 x daily - 7 x weekly - 3 sets - 10 reps  Supine Bridge with Resistance Band - 1 x daily - 7 x weekly - 3 sets - 10 reps  Standing Hamstring Stretch with Step - 1 x daily - 7 x weekly - 3 sets - 10 reps  Supine Gluteus Stretch - 1 x daily - 7 x weekly - 3 sets - 10 reps  Energy manager with Strap - 1 x daily - 7 x weekly - 3 sets - 10 reps    Consulted and Agree with Plan of Care Patient    Family Member Consulted Mother             Patient will benefit from skilled therapeutic intervention in order to improve the following deficits and impairments:  Abnormal gait, Decreased endurance, Decreased strength, Difficulty walking, Decreased balance, Decreased mobility, Increased fascial restricitons, Increased muscle spasms, Pain, Impaired perceived functional ability, Decreased coordination, Postural dysfunction, Improper body mechanics  Visit Diagnosis: Chronic pain of right knee  Other abnormalities of gait and mobility  Repeated falls     Problem List Patient Active Problem List   Diagnosis Date Noted   Chronic pain of right knee 01/11/2021   Encounter for medical examination to establish care 01/11/2021   Bipolar 1 disorder (HCC) 10/18/2018   Cannabis dependence, continuous (HCC) 10/18/2018   Tobacco use disorder 08/20/2017   GAD (generalized anxiety disorder) 09/25/2016    Dessie Coma PT DPT  02/09/2021, 8:19 AM  [facility] [address] [place], [place], [address] Phone: [phone]   Fax:  [phone]  Name: XUE LOW MRN: [id number] Date of Birth: [birth year]

## 2021-02-11 ENCOUNTER — Other Ambulatory Visit: Payer: Self-pay

## 2021-02-11 ENCOUNTER — Encounter (HOSPITAL_BASED_OUTPATIENT_CLINIC_OR_DEPARTMENT_OTHER): Payer: Self-pay | Admitting: Physical Therapy

## 2021-02-11 ENCOUNTER — Ambulatory Visit (HOSPITAL_BASED_OUTPATIENT_CLINIC_OR_DEPARTMENT_OTHER): Payer: 59 | Attending: Nurse Practitioner | Admitting: Physical Therapy

## 2021-02-11 DIAGNOSIS — M25561 Pain in right knee: Secondary | ICD-10-CM | POA: Diagnosis present

## 2021-02-11 DIAGNOSIS — R296 Repeated falls: Secondary | ICD-10-CM

## 2021-02-11 DIAGNOSIS — R2689 Other abnormalities of gait and mobility: Secondary | ICD-10-CM | POA: Diagnosis not present

## 2021-02-11 DIAGNOSIS — G8929 Other chronic pain: Secondary | ICD-10-CM

## 2021-02-11 NOTE — Therapy (Signed)
[facility] [address] [place], [place], [address] Phone: [phone]   Fax:  [phone]  Physical Therapy Treatment  Patient Details  Name: Clinton Fisher MRN: [id number] Date of Birth: [birth year] Referring Provider (PT): Georgeanne Nim NP   Encounter Date: 02/11/2021   PT End of Session - 02/11/21 1526     Visit Number 6    Number of Visits 12    Date for PT Re-Evaluation 03/09/21    Authorization Type [employer] 2022    PT Start Time 1432    PT Stop Time 1513    PT Time Calculation (min) 41 min    Activity Tolerance Patient tolerated treatment well    Behavior During Therapy [facility] for tasks assessed/performed             Past Medical History:  Diagnosis Date   Abrasion of index finger 07/29/2013   left   Bipolar I disorder, current or most recent episode manic, severe (HCC) 12/14/2020   Bipolar I disorder, current or most recent episode manic, with psychotic features (HCC) 12/14/2020   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   Manic affective disorder with recurrent episode (HCC)    Seasonal allergies    sore throat 07/29/2013    Past Surgical History:  Procedure Laterality Date   FOOT FOREIGN BODY REMOVAL Right 01/19/2011   KNEE ARTHROSCOPY Right 08/01/2013   Procedure: ARTHROSCOPY RIGHT KNEE, Chondroplasty, Excision of Plica;  Surgeon: Harvie Junior, MD;  Location: [facility];  Service: Orthopedics;  Laterality: Right;    There were no vitals filed for this visit.   Subjective Assessment - 02/11/21 1436     Subjective Patient reports that his knee is feeling stronger. He still has pain but patient states function is better than it was in the past.    Patient is accompained by: Family member    Pertinent History orthoscoptic surgery for overworked 8 years ago, X-ray no OA.    Limitations Lifting;Standing;Walking    How  long can you sit comfortably? n/a    How long can you stand comfortably? all day but limited    How long can you walk comfortably? 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 Yes    Pain Score 4     Pain Location Knee    Pain Orientation Right    Pain Descriptors / Indicators Aching    Pain Type Chronic pain    Pain Onset More than a month ago    Pain Frequency Intermittent    Aggravating Factors  standing and walking    Pain Relieving Factors medication and rest    Effect of Pain on Daily Activities cannot work    Multiple Pain Sites No                               [facility] Adult PT Treatment/Exercise - 02/11/21 0001       Lumbar Exercises: Aerobic   Nustep 5 mins, level 2      Knee/Hip Exercises: Standing   Forward Step Up 2 sets;15 reps;Hand Hold: 0;Step Height: 6"    Forward Step Up Limitations step up with added balance 2x15, 6 inch step    Functional Squat 2 sets;10  reps    Functional Squat Limitations sit to stand: 2x8    Other Standing Knee Exercises slow standing march 2x10 with 3 second hold for balance; heel raise x20    Other Standing Knee Exercises forward hurdles with no hand on rail 6x3h, side step with hurdles with no hand on rails 6x3h.      Knee/Hip Exercises: Seated   Long Arc Quad Strengthening;Both;2 sets;10 reps    Long Arc Quad Limitations red band      Knee/Hip Exercises: Supine   Quad Sets AROM    Quad Sets Limitations with ball 2x10    Bridges Strengthening;Both;2 [employer] Limitations green band, x10    Straight Leg Raises Limitations 3x10    Other Supine Knee/Hip Exercises clam shells with green band, 2x10                    PT Education - 02/11/21 1439     Education Details Reviewed HEP and balance exercises.    Person(s) Educated Patient    Methods Explanation;Demonstration;Tactile cues;Verbal cues    Comprehension  Verbalized understanding;Returned demonstration;Verbal cues required;Tactile cues required              PT Short Term Goals - 02/11/21 1708       PT SHORT TERM GOAL #1   Title Patient will report reduced pain to a 3/10 for knee pain.    Baseline 5/10    Time 3    Period Weeks    Status New    Target Date 02/16/21      PT SHORT TERM GOAL #2   Title Patient will increase bilateral hip flexion strength to 4+/5    Baseline 3+/5    Time 3    Period Weeks    Status New    Target Date 02/16/21               PT Long Term Goals - 02/01/21 1612       PT LONG TERM GOAL #1   Title Patient will increase strength of bilateral hip 5/5 throughout to improve endurance and decrease risk of falls during ADL's.    Baseline 4/5    Time 6    Period Weeks    Status New      PT LONG TERM GOAL #2   Title Patient will improve gait mechanics in order to ambulate 1000 feet with no fatigue or pain in right knee.    Baseline slow cadence, Trendelenburg gait, decreased hip flexion during swing phase    Time 6    Period Weeks    Status New      PT LONG TERM GOAL #3   Title Patient will have full understanding of final HEP plan to perform thera-ex at home without supervision to be able to go to work.    Baseline initial HEP    Time 6    Period Weeks    Status New                   Plan - 02/11/21 1440     Clinical Impression Statement Patient continues to improve strength. He is able to performed his HEP without tactile cueing. Patient is able to complete strengthening and balance exercises without an increase in pain and discomfort. Patient should continue to improve HEP as tolerated. Future therapy will be on land and should continue to improve strength, balance, and endurance.    Personal Factors and Comorbidities Comorbidity  2;Time since onset of injury/illness/exacerbation;Fitness    Comorbidities chronic pain, GAD, Bipolar 1    Examination-Activity Limitations  Stairs;Squat;Stand;Lift;Transfers    Examination-Participation Restrictions Nurse, adult;Yard Work    Stability/Clinical Decision Making Stable/Uncomplicated    Clinical Decision Making Low    Rehab Potential Good    PT Frequency 2x / week    PT Duration 6 weeks    PT Treatment/Interventions Aquatic Therapy;ADLs/Self Care Home Management;Electrical Stimulation;Cryotherapy;Iontophoresis 4mg /ml Dexamethasone;Gait training;Stair training;Contrast Bath;Ultrasound;Therapeutic activities;Therapeutic exercise;Balance training;Neuromuscular re-education;Patient/family education;Functional mobility training;Passive range of motion;Manual techniques;Taping;Vasopneumatic Device;Joint Manipulations    PT Next Visit Plan Implement hip and knee strength focused HEP, work on gait abnormalites (focus on increasing hip flexion, use of calves during push off), implement stretching exercises, aquatic therapy to improve balance and strength in LE to reduce risk of falls. Sit to stands and more functional exercies.    PT Home Exercise Plan Access Code: [email or web address]  URL: [email or web address]  Date: 02/02/2021  Prepared by: 02/04/2021    Exercises  Supine Quad Set - 1 x daily - 7 x weekly - 3 sets - 10 reps  Supine Knee Extension Strengthening - 1 x daily - 7 x weekly - 3 sets - 10 reps  Hooklying Clamshell with Resistance - 1 x daily - 7 x weekly - 3 sets - 10 reps  Supine Bridge with Resistance Band - 1 x daily - 7 x weekly - 3 sets - 10 reps  Standing Hamstring Stretch with Step - 1 x daily - 7 x weekly - 3 sets - 10 reps  Supine Gluteus Stretch - 1 x daily - 7 x weekly - 3 sets - 10 reps  Thomas Stretch with Strap - 1 x daily - 7 x weekly - 3 sets - 10 reps, sit to stand 3x10, knee extension with band 3x10, hurdles for balance 3x10,    Consulted and Agree with Plan of Care Patient    Family Member Consulted Mother             Patient will benefit from skilled  therapeutic intervention in order to improve the following deficits and impairments:  Abnormal gait, Decreased endurance, Decreased strength, Difficulty walking, Decreased balance, Decreased mobility, Increased fascial restricitons, Increased muscle spasms, Pain, Impaired perceived functional ability, Decreased coordination, Postural dysfunction, Improper body mechanics  Visit Diagnosis: Chronic pain of right knee  Other abnormalities of gait and mobility  Repeated falls     Problem List Patient Active Problem List   Diagnosis Date Noted   Chronic pain of right knee 01/11/2021   Encounter for medical examination to establish care 01/11/2021   Bipolar 1 disorder (HCC) 10/18/2018   Cannabis dependence, continuous (HCC) 10/18/2018   Tobacco use disorder 08/20/2017   GAD (generalized anxiety disorder) 09/25/2016   11/25/2016 PT DPT  02/11/2021 02/13/2021 SPT 02/11/2021, 5:09 PM  During this treatment session, the therapist was present, participating in and directing the treatment.   [facility] [address] [place], [place], [place] Phone: [phone]   Fax:  [phone]  Name: Clinton Fisher MRN: Rennis Petty Date of Birth: 1990/12/24

## 2021-02-15 ENCOUNTER — Ambulatory Visit (HOSPITAL_BASED_OUTPATIENT_CLINIC_OR_DEPARTMENT_OTHER): Payer: 59 | Admitting: Physical Therapy

## 2021-02-15 ENCOUNTER — Encounter (HOSPITAL_BASED_OUTPATIENT_CLINIC_OR_DEPARTMENT_OTHER): Payer: Self-pay | Admitting: Physical Therapy

## 2021-02-15 ENCOUNTER — Other Ambulatory Visit: Payer: Self-pay

## 2021-02-15 DIAGNOSIS — M25561 Pain in right knee: Secondary | ICD-10-CM | POA: Diagnosis not present

## 2021-02-15 DIAGNOSIS — G8929 Other chronic pain: Secondary | ICD-10-CM

## 2021-02-15 DIAGNOSIS — R2689 Other abnormalities of gait and mobility: Secondary | ICD-10-CM

## 2021-02-15 DIAGNOSIS — R296 Repeated falls: Secondary | ICD-10-CM

## 2021-02-15 NOTE — Therapy (Signed)
[facility] [address] [place], [place], [address] Phone: [phone]   Fax:  [phone]  Physical Therapy Treatment  Patient Details  Name: Clinton Fisher MRN: [id number] Date of Birth: 04-27-1991 Referring Provider (PT): Georgeanne Nim NP   Encounter Date: 02/15/2021   PT End of Session - 02/15/21 1452     Visit Number 7    Number of Visits 12    Date for PT Re-Evaluation 03/09/21    Authorization Type [employer] 2022    PT Start Time 1430    PT Stop Time 1515    PT Time Calculation (min) 45 min    Equipment Utilized During Treatment Other (comment)    Activity Tolerance Patient tolerated treatment well    Behavior During Therapy [employer] for tasks assessed/performed             Past Medical History:  Diagnosis Date   Abrasion of index finger 07/29/2013   left   Bipolar I disorder, current or most recent episode manic, severe (HCC) 12/14/2020   Bipolar I disorder, current or most recent episode manic, with psychotic features (HCC) 12/14/2020   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   Manic affective disorder with recurrent episode (HCC)    Seasonal allergies    sore throat 07/29/2013    Past Surgical History:  Procedure Laterality Date   FOOT FOREIGN BODY REMOVAL Right 01/19/2011   KNEE ARTHROSCOPY Right 08/01/2013   Procedure: ARTHROSCOPY RIGHT KNEE, Chondroplasty, Excision of Plica;  Surgeon: Harvie Junior, MD;  Location: [facility];  Service: Orthopedics;  Laterality: Right;    There were no vitals filed for this visit.   Subjective Assessment - 02/15/21 1435     Subjective Patient reports that his knee pain flaire up over the weekend after "overdoing" his exercises. He states that his pain has calmed down since yesterday and is back to normal. Patient states that he is getting stronger.    Pertinent History orthoscoptic  surgery for overworked 8 years ago, X-ray no OA.    Limitations Lifting;Standing;Walking    How long can you sit comfortably? n/a    How long can you stand comfortably? all day but limited    How long can you walk comfortably? 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 Yes    Pain Score 5     Pain Location Knee    Pain Orientation Right    Pain Descriptors / Indicators Aching    Pain Type Chronic pain    Pain Onset More than a month ago    Pain Frequency Intermittent    Aggravating Factors  standing and walking    Pain Relieving Factors medication and rest    Effect of Pain on Daily Activities cannot work    Multiple Pain Sites No                               [facility] Adult PT Treatment/Exercise - 02/15/21 0001       Lumbar Exercises: Aerobic   Nustep 5 mins, level 1      Knee/Hip Exercises: Standing   Forward Step Up 2 sets;15 reps;Hand Hold: 0;Step Height: 6"    Forward Step Up Limitations step up with  added balance 2x15, 6 inch step    Functional Squat 2 sets;10 reps    Functional Squat Limitations sit to stand: 2x10    Other Standing Knee Exercises slow standing march 2x10 with 3 second hold for balance; heel raise x20    Other Standing Knee Exercises forward hurdles with no hand on rail 6x3h, side step with hurdles with no hand on rails 6x3h.      Knee/Hip Exercises: Seated   Long Arc Quad Strengthening;Both;2 sets;10 reps    Long Arc Quad Limitations green band      Knee/Hip Exercises: Supine   Quad Sets AROM    Quad Sets Limitations with ball 2x10    Bridges Strengthening;Both;2 [employer] Limitations green band, x10    Straight Leg Raises Limitations 3x10    Other Supine Knee/Hip Exercises clam shells with green band, 2x10                    PT Education - 02/15/21 1452     Education Details HEP and educated on DOMS.    Person(s) Educated  Patient    Methods Explanation;Demonstration;Verbal cues    Comprehension Verbalized understanding;Returned demonstration;Verbal cues required              PT Short Term Goals - 02/11/21 1708       PT SHORT TERM GOAL #1   Title Patient will report reduced pain to a 3/10 for knee pain.    Baseline 5/10    Time 3    Period Weeks    Status New    Target Date 02/16/21      PT SHORT TERM GOAL #2   Title Patient will increase bilateral hip flexion strength to 4+/5    Baseline 3+/5    Time 3    Period Weeks    Status New    Target Date 02/16/21               PT Long Term Goals - 02/01/21 1612       PT LONG TERM GOAL #1   Title Patient will increase strength of bilateral hip 5/5 throughout to improve endurance and decrease risk of falls during ADL's.    Baseline 4/5    Time 6    Period Weeks    Status New      PT LONG TERM GOAL #2   Title Patient will improve gait mechanics in order to ambulate 1000 feet with no fatigue or pain in right knee.    Baseline slow cadence, Trendelenburg gait, decreased hip flexion during swing phase    Time 6    Period Weeks    Status New      PT LONG TERM GOAL #3   Title Patient will have full understanding of final HEP plan to perform thera-ex at home without supervision to be able to go to work.    Baseline initial HEP    Time 6    Period Weeks    Status New                   Plan - 02/15/21 1454     Clinical Impression Statement Patient continues to improve strength and balance. Patient understands being sore with 24-48 hours of exercise is normal. Patient is able to complete thera-ex without increasing pain. Patient should continue with therapy to continue with HEP as tolerated.    Personal Factors and Comorbidities Comorbidity 2;Time since onset of injury/illness/exacerbation;Fitness  Comorbidities chronic pain, GAD, Bipolar 1    Examination-Activity Limitations Stairs;Squat;Stand;Lift;Transfers     Examination-Participation Restrictions Nurse, adult;Yard Work    Stability/Clinical Decision Making Stable/Uncomplicated    Clinical Decision Making Low    Rehab Potential Good    PT Frequency 2x / week    PT Duration 6 weeks    PT Treatment/Interventions Aquatic Therapy;ADLs/Self Care Home Management;Electrical Stimulation;Cryotherapy;Iontophoresis 4mg /ml Dexamethasone;Gait training;Stair training;Contrast Bath;Ultrasound;Therapeutic activities;Therapeutic exercise;Balance training;Neuromuscular re-education;Patient/family education;Functional mobility training;Passive range of motion;Manual techniques;Taping;Vasopneumatic Device;Joint Manipulations    PT Next Visit Plan Implement hip and knee strength focused HEP, work on gait abnormalites (focus on increasing hip flexion, use of calves during push off), implement stretching exercises, aquatic therapy to improve balance and strength in LE to reduce risk of falls. Sit to stands and more functional exercies.    PT Home Exercise Plan Access Code: [email or web address]  URL: [email or web address]  Date: 02/02/2021  Prepared by: 02/04/2021    Exercises  Supine Quad Set - 1 x daily - 7 x weekly - 3 sets - 10 reps  Supine Knee Extension Strengthening - 1 x daily - 7 x weekly - 3 sets - 10 reps  Hooklying Clamshell with Resistance - 1 x daily - 7 x weekly - 3 sets - 10 reps  Supine Bridge with Resistance Band - 1 x daily - 7 x weekly - 3 sets - 10 reps  Standing Hamstring Stretch with Step - 1 x daily - 7 x weekly - 3 sets - 10 reps  Supine Gluteus Stretch - 1 x daily - 7 x weekly - 3 sets - 10 reps  Thomas Stretch with Strap - 1 x daily - 7 x weekly - 3 sets - 10 reps, sit to stand 3x10, knee extension with band 3x10, hurdles for balance 3x10,    Consulted and Agree with Plan of Care Patient    Family Member Consulted --             Patient will benefit from skilled therapeutic intervention in order to improve the  following deficits and impairments:  Abnormal gait, Decreased endurance, Decreased strength, Difficulty walking, Decreased balance, Decreased mobility, Increased fascial restricitons, Increased muscle spasms, Pain, Impaired perceived functional ability, Decreased coordination, Postural dysfunction, Improper body mechanics  Visit Diagnosis: Chronic pain of right knee  Other abnormalities of gait and mobility  Repeated falls     Problem List Patient Active Problem List   Diagnosis Date Noted   Chronic pain of right knee 01/11/2021   Encounter for medical examination to establish care 01/11/2021   Bipolar 1 disorder (HCC) 10/18/2018   Cannabis dependence, continuous (HCC) 10/18/2018   Tobacco use disorder 08/20/2017   GAD (generalized anxiety disorder) 09/25/2016    11/25/2016 PT DPT  02/15/2021, 4:34 PM  02/17/2021 SPT 02/15/2021   During this treatment session, the therapist was present, participating in and directing the treatment.   [facility] [address] [place], [place], [place] Phone: [phone]   Fax:  [phone]  Name: FILOMENO CROMLEY MRN: Rennis Petty Date of Birth: 30-Mar-1991

## 2021-02-18 ENCOUNTER — Ambulatory Visit (HOSPITAL_BASED_OUTPATIENT_CLINIC_OR_DEPARTMENT_OTHER): Payer: 59 | Admitting: Physical Therapy

## 2021-02-18 ENCOUNTER — Encounter (HOSPITAL_BASED_OUTPATIENT_CLINIC_OR_DEPARTMENT_OTHER): Payer: Self-pay | Admitting: Physical Therapy

## 2021-02-18 ENCOUNTER — Other Ambulatory Visit: Payer: Self-pay

## 2021-02-18 DIAGNOSIS — R296 Repeated falls: Secondary | ICD-10-CM

## 2021-02-18 DIAGNOSIS — R2689 Other abnormalities of gait and mobility: Secondary | ICD-10-CM

## 2021-02-18 DIAGNOSIS — M25561 Pain in right knee: Secondary | ICD-10-CM

## 2021-02-18 DIAGNOSIS — G8929 Other chronic pain: Secondary | ICD-10-CM

## 2021-02-18 NOTE — Therapy (Addendum)
[facility] [address] [place], [place], [address] Phone: [phone]   Fax:  [phone]  Physical Therapy Treatment  Patient Details  Name: DEZMOND DOWNIE MRN: [id number] Date of Birth: June 17, 1991 Referring Provider (PT): Georgeanne Nim NP   Encounter Date: 02/18/2021   PT End of Session - 02/18/21 1206     Visit Number 8    Number of Visits 12    Date for PT Re-Evaluation 03/09/21    Authorization Type [employer] 2022    PT Start Time 1150    PT Stop Time 1232    PT Time Calculation (min) 42 min    Activity Tolerance Patient tolerated treatment well    Behavior During Therapy [facility] for tasks assessed/performed             Past Medical History:  Diagnosis Date   Abrasion of index finger 07/29/2013   left   Bipolar I disorder, current or most recent episode manic, severe (HCC) 12/14/2020   Bipolar I disorder, current or most recent episode manic, with psychotic features (HCC) 12/14/2020   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   Manic affective disorder with recurrent episode (HCC)    Seasonal allergies    sore throat 07/29/2013    Past Surgical History:  Procedure Laterality Date   FOOT FOREIGN BODY REMOVAL Right 01/19/2011   KNEE ARTHROSCOPY Right 08/01/2013   Procedure: ARTHROSCOPY RIGHT KNEE, Chondroplasty, Excision of Plica;  Surgeon: Harvie Junior, MD;  Location: [facility];  Service: Orthopedics;  Laterality: Right;    There were no vitals filed for this visit.   Subjective Assessment - 02/18/21 1203     Subjective Patient states that his knee is feeling sore but states it is tolerable. Patient states his HEP is challenging but not to difficult.    Pertinent History orthoscoptic surgery for overworked 8 years ago, X-ray no OA.    Limitations Lifting;Standing;Walking    How long can you sit comfortably? n/a    How long  can you stand comfortably? all day but limited    How long can you walk comfortably? 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 Yes    Pain Score 6     Pain Location Knee    Pain Orientation Right    Pain Descriptors / Indicators Aching    Pain Type Chronic pain    Pain Onset More than a month ago    Pain Frequency Intermittent    Aggravating Factors  standing and walking    Pain Relieving Factors medication and rest    Effect of Pain on Daily Activities cannot work    Multiple Pain Sites No                               [facility] Adult PT Treatment/Exercise - 02/18/21 0001       Lumbar Exercises: Aerobic   Nustep 5 mins, level 2      Knee/Hip Exercises: Standing   Heel Raises Both;2 sets;10 reps    Forward Step Up 2 sets;15 reps;Hand Hold: 0;Step Height: 6"    Forward Step Up Limitations step up with added balance 2x15, 6 inch step    Functional Squat 3 sets;10 reps  Functional Squat Limitations sit to stand with red band around knees    Other Standing Knee Exercises slow standing march 2x10 with 3 second hold for balance; heel raise x20      Knee/Hip Exercises: Seated   Long Arc Quad Strengthening;Both;2 sets;10 reps    Long Arc Quad Limitations green band      Knee/Hip Exercises: Supine   Quad Sets AROM    Quad Sets Limitations with ball 2x10    Bridges Strengthening;Both;2 [employer] Limitations green band, x10    Straight Leg Raises Limitations 2x10    Other Supine Knee/Hip Exercises clam shells with green band, 2x10                    PT Education - 02/18/21 1206     Education Details HEP and education on stretches.    Person(s) Educated Patient    Methods Explanation;Demonstration;Verbal cues    Comprehension Verbalized understanding;Returned demonstration;Verbal cues required              PT Short Term Goals - 02/11/21 1708        PT SHORT TERM GOAL #1   Title Patient will report reduced pain to a 3/10 for knee pain.    Baseline 5/10    Time 3    Period Weeks    Status New    Target Date 02/16/21      PT SHORT TERM GOAL #2   Title Patient will increase bilateral hip flexion strength to 4+/5    Baseline 3+/5    Time 3    Period Weeks    Status New    Target Date 02/16/21               PT Long Term Goals - 02/01/21 1612       PT LONG TERM GOAL #1   Title Patient will increase strength of bilateral hip 5/5 throughout to improve endurance and decrease risk of falls during ADL's.    Baseline 4/5    Time 6    Period Weeks    Status New      PT LONG TERM GOAL #2   Title Patient will improve gait mechanics in order to ambulate 1000 feet with no fatigue or pain in right knee.    Baseline slow cadence, Trendelenburg gait, decreased hip flexion during swing phase    Time 6    Period Weeks    Status New      PT LONG TERM GOAL #3   Title Patient will have full understanding of final HEP plan to perform thera-ex at home without supervision to be able to go to work.    Baseline initial HEP    Time 6    Period Weeks    Status New                   Plan - 02/18/21 1209     Clinical Impression Statement Patient tolerated treatment with no increase in pain or discomfort. Patient is completing weight bearing exercises focused on balanced and strength with decreased difficulty. Patient should continue with therapy to continue to improve strength and balance of lower extremity.    Personal Factors and Comorbidities Comorbidity 2;Time since onset of injury/illness/exacerbation;Fitness    Comorbidities chronic pain, GAD, Bipolar 1    Examination-Activity Limitations Stairs;Squat;Stand;Lift;Transfers    Examination-Participation Restrictions Nurse, adult;Yard Work    Stability/Clinical Decision Making Stable/Uncomplicated    Optometrist Low  Rehab  Potential Good    PT Frequency 2x / week    PT Duration 6 weeks    PT Treatment/Interventions Aquatic Therapy;ADLs/Self Care Home Management;Electrical Stimulation;Cryotherapy;Iontophoresis 4mg /ml Dexamethasone;Gait training;Stair training;Contrast Bath;Ultrasound;Therapeutic activities;Therapeutic exercise;Balance training;Neuromuscular re-education;Patient/family education;Functional mobility training;Passive range of motion;Manual techniques;Taping;Vasopneumatic Device;Joint Manipulations    PT Next Visit Plan Implement hip and knee strength focused HEP, work on gait abnormalites (focus on increasing hip flexion, use of calves during push off), implement stretching exercises, aquatic therapy to improve balance and strength in LE to reduce risk of falls. Sit to stands and more functional exercies.    PT Home Exercise Plan Access Code: [email or web address]  URL: [email or web address]  Date: 02/02/2021  Prepared by: 02/04/2021    Exercises  Supine Quad Set - 1 x daily - 7 x weekly - 3 sets - 10 reps  Supine Knee Extension Strengthening - 1 x daily - 7 x weekly - 3 sets - 10 reps  Hooklying Clamshell with Resistance - 1 x daily - 7 x weekly - 3 sets - 10 reps  Supine Bridge with Resistance Band - 1 x daily - 7 x weekly - 3 sets - 10 reps  Standing Hamstring Stretch with Step - 1 x daily - 7 x weekly - 3 sets - 10 reps  Supine Gluteus Stretch - 1 x daily - 7 x weekly - 3 sets - 10 reps  Lorayne Bender with Strap - 1 x daily - 7 x weekly - 3 sets - 10 reps, sit to stand 3x10, knee extension with band 3x10, hurdles for balance 3x10,    Consulted and Agree with Plan of Care Patient             Patient will benefit from skilled therapeutic intervention in order to improve the following deficits and impairments:  Abnormal gait, Decreased endurance, Decreased strength, Difficulty walking, Decreased balance, Decreased mobility, Increased fascial restricitons, Increased muscle spasms, Pain, Impaired  perceived functional ability, Decreased coordination, Postural dysfunction, Improper body mechanics  Visit Diagnosis: Chronic pain of right knee  Other abnormalities of gait and mobility  Repeated falls     Problem List Patient Active Problem List   Diagnosis Date Noted   Chronic pain of right knee 01/11/2021   Encounter for medical examination to establish care 01/11/2021   Bipolar 1 disorder (HCC) 10/18/2018   Cannabis dependence, continuous (HCC) 10/18/2018   Tobacco use disorder 08/20/2017   GAD (generalized anxiety disorder) 09/25/2016   11/25/2016 PT DPT  02/18/2021    02/20/2021 SPT 02/18/2021, 1:07 PM  During this treatment session, the therapist was present, participating in and directing the treatment.   [facility] [address] [place], [place], [place] Phone: [phone]   Fax:  [phone]  Name: JLON BETKER MRN: Rennis Petty Date of Birth: March 27, 1991

## 2021-02-21 ENCOUNTER — Other Ambulatory Visit: Payer: Self-pay

## 2021-02-21 ENCOUNTER — Ambulatory Visit (HOSPITAL_BASED_OUTPATIENT_CLINIC_OR_DEPARTMENT_OTHER): Payer: 59 | Attending: Nurse Practitioner | Admitting: Physical Therapy

## 2021-02-21 ENCOUNTER — Encounter (HOSPITAL_BASED_OUTPATIENT_CLINIC_OR_DEPARTMENT_OTHER): Payer: Self-pay | Admitting: Physical Therapy

## 2021-02-21 DIAGNOSIS — R2689 Other abnormalities of gait and mobility: Secondary | ICD-10-CM | POA: Diagnosis present

## 2021-02-21 DIAGNOSIS — M25561 Pain in right knee: Secondary | ICD-10-CM | POA: Diagnosis present

## 2021-02-21 DIAGNOSIS — G8929 Other chronic pain: Secondary | ICD-10-CM | POA: Diagnosis present

## 2021-02-21 DIAGNOSIS — R296 Repeated falls: Secondary | ICD-10-CM | POA: Insufficient documentation

## 2021-02-21 NOTE — Therapy (Signed)
[facility] [address] [place], [place], [address] Phone: [phone]   Fax:  [phone]  Physical Therapy Treatment  Patient Details  Name: PONCIANO SHEALY MRN: [id number] Date of Birth: 1991-03-01 Referring Provider (PT): Georgeanne Nim NP   Encounter Date: 02/21/2021   PT End of Session - 02/21/21 1441     Visit Number 9    Number of Visits 12    Date for PT Re-Evaluation 03/09/21    Authorization Type [employer] 2022    PT Start Time 1433    PT Stop Time 1515    PT Time Calculation (min) 42 min    Equipment Utilized During Treatment Other (comment)    Activity Tolerance Patient tolerated treatment well    Behavior During Therapy [facility] for tasks assessed/performed             Past Medical History:  Diagnosis Date   Abrasion of index finger 07/29/2013   left   Bipolar I disorder, current or most recent episode manic, severe (HCC) 12/14/2020   Bipolar I disorder, current or most recent episode manic, with psychotic features (HCC) 12/14/2020   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   Manic affective disorder with recurrent episode (HCC)    Seasonal allergies    sore throat 07/29/2013    Past Surgical History:  Procedure Laterality Date   FOOT FOREIGN BODY REMOVAL Right 01/19/2011   KNEE ARTHROSCOPY Right 08/01/2013   Procedure: ARTHROSCOPY RIGHT KNEE, Chondroplasty, Excision of Plica;  Surgeon: Harvie Junior, MD;  Location: [facility];  Service: Orthopedics;  Laterality: Right;    There were no vitals filed for this visit.   Subjective Assessment - 02/21/21 1437     Subjective Patient states that he was sore after last treatment but stated it was "good type of sore." Patient stated that he was busy doing maintence work on the house with breaks as needed. Patient states HEP is at a good level right now.    Pertinent History  orthoscoptic surgery for overworked 8 years ago, X-ray no OA.    Limitations Lifting;Standing;Walking    How long can you sit comfortably? n/a    How long can you stand comfortably? all day but limited    How long can you walk comfortably? 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 Yes    Pain Score 6     Pain Location Knee    Pain Orientation Right    Pain Descriptors / Indicators Aching    Pain Type Chronic pain    Pain Onset More than a month ago    Pain Frequency Intermittent    Aggravating Factors  standing and walking    Pain Relieving Factors medication and rest    Effect of Pain on Daily Activities cannot work    Multiple Pain Sites No                               [facility] Adult PT Treatment/Exercise - 02/21/21 0001       Lumbar Exercises: Aerobic   Nustep 5 mins, level 2      Knee/Hip Exercises: Standing   Heel Raises Both;2 sets;10 reps    Forward Step Up --  Forward Step Up Limitations --    Step Down Both;2 sets;5 reps    Step Down Limitations verbal cueing required to keep knee straight    Functional Squat 10 reps;2 sets    Functional Squat Limitations sit to stand with red band around knees    Other Standing Knee Exercises slow standing march 2x10 with 3 second hold for balance; heel raise x20    Other Standing Knee Exercises forward hurdles with no hand on rail 8x3h, side step with hurdles with no hand on rails 8x3h.      Knee/Hip Exercises: Seated   Long Arc Quad Strengthening;Both;2 sets;10 reps    Long Arc Quad Limitations red band      Knee/Hip Exercises: Supine   Quad Sets AROM    Quad Sets Limitations with pink ball 2x15    Bridges Strengthening;Both;2 sets    Bridges Limitations green band, 2x10    Straight Leg Raises Limitations 1x15, 1x15 with 1lb ankle weight    Other Supine Knee/Hip Exercises clam shells with green band, 2x15                     PT Education - 02/21/21 1439     Education Details Reviewed HEP and how to adjuct plan if sore.    Person(s) Educated Patient    Methods Explanation;Demonstration;Verbal cues    Comprehension Verbalized understanding;Returned demonstration;Verbal cues required              PT Short Term Goals - 02/11/21 1708       PT SHORT TERM GOAL #1   Title Patient will report reduced pain to a 3/10 for knee pain.    Baseline 5/10    Time 3    Period Weeks    Status New    Target Date 02/16/21      PT SHORT TERM GOAL #2   Title Patient will increase bilateral hip flexion strength to 4+/5    Baseline 3+/5    Time 3    Period Weeks    Status New    Target Date 02/16/21               PT Long Term Goals - 02/01/21 1612       PT LONG TERM GOAL #1   Title Patient will increase strength of bilateral hip 5/5 throughout to improve endurance and decrease risk of falls during ADL's.    Baseline 4/5    Time 6    Period Weeks    Status New      PT LONG TERM GOAL #2   Title Patient will improve gait mechanics in order to ambulate 1000 feet with no fatigue or pain in right knee.    Baseline slow cadence, Trendelenburg gait, decreased hip flexion during swing phase    Time 6    Period Weeks    Status New      PT LONG TERM GOAL #3   Title Patient will have full understanding of final HEP plan to perform thera-ex at home without supervision to be able to go to work.    Baseline initial HEP    Time 6    Period Weeks    Status New                   Plan - 02/21/21 1442     Clinical Impression Statement Patient tolerated treatment well with no increase in pain or discomfort. Patient is able to complete weight bearing  exercises focused on balance and strength for lower extremity. Therapy should continue as tolerated. Next visit can begin to incorporate weight exercises for both standing and seated exercises.    Personal Factors and Comorbidities  Comorbidity 2;Time since onset of injury/illness/exacerbation;Fitness    Comorbidities chronic pain, GAD, Bipolar 1    Examination-Activity Limitations Stairs;Squat;Stand;Lift;Transfers    Examination-Participation Restrictions Nurse, adult;Yard Work    Stability/Clinical Decision Making Stable/Uncomplicated    Clinical Decision Making Low    Rehab Potential Good    PT Frequency 2x / week    PT Duration 6 weeks    PT Treatment/Interventions Aquatic Therapy;ADLs/Self Care Home Management;Electrical Stimulation;Cryotherapy;Iontophoresis 4mg /ml Dexamethasone;Gait training;Stair training;Contrast Bath;Ultrasound;Therapeutic activities;Therapeutic exercise;Balance training;Neuromuscular re-education;Patient/family education;Functional mobility training;Passive range of motion;Manual techniques;Taping;Vasopneumatic Device;Joint Manipulations    PT Next Visit Plan Implement hip and knee strength focused HEP, work on gait abnormalites (focus on increasing hip flexion, use of calves during push off), implement stretching exercises, aquatic therapy to improve balance and strength in LE to reduce risk of falls. Sit to stands and more functional exercies. weighted exercises    PT Home Exercise Plan Access Code: [email or web address]  URL: [email or web address]  Date: 02/02/2021  Prepared by: 02/04/2021    Exercises  Supine Quad Set - 1 x daily - 7 x weekly - 3 sets - 10 reps  Supine Knee Extension Strengthening - 1 x daily - 7 x weekly - 3 sets - 10 reps  Hooklying Clamshell with Resistance - 1 x daily - 7 x weekly - 3 sets - 10 reps  Supine Bridge with Resistance Band - 1 x daily - 7 x weekly - 3 sets - 10 reps  Standing Hamstring Stretch with Step - 1 x daily - 7 x weekly - 3 sets - 10 reps  Supine Gluteus Stretch - 1 x daily - 7 x weekly - 3 sets - 10 reps  Lorayne Bender with Strap - 1 x daily - 7 x weekly - 3 sets - 10 reps, sit to stand 3x10, knee extension with band  3x10, hurdles for balance 3x10,    Consulted and Agree with Plan of Care Patient             Patient will benefit from skilled therapeutic intervention in order to improve the following deficits and impairments:  Abnormal gait, Decreased endurance, Decreased strength, Difficulty walking, Decreased balance, Decreased mobility, Increased fascial restricitons, Increased muscle spasms, Pain, Impaired perceived functional ability, Decreased coordination, Postural dysfunction, Improper body mechanics  Visit Diagnosis: Chronic pain of right knee  Other abnormalities of gait and mobility  Repeated falls     Problem List Patient Active Problem List   Diagnosis Date Noted   Chronic pain of right knee 01/11/2021   Encounter for medical examination to establish care 01/11/2021   Bipolar 1 disorder (HCC) 10/18/2018   Cannabis dependence, continuous (HCC) 10/18/2018   Tobacco use disorder 08/20/2017   GAD (generalized anxiety disorder) 09/25/2016    11/25/2016 PT DPT  02/21/2021, 3:35 PM  04/23/2021  02/21/2021  During this treatment session, the therapist was present, participating in and directing the treatment.    [facility] [address] [place], [place], [place] Phone: [phone]   Fax:  [phone]  Name: KELAN PRITT MRN: Rennis Petty Date of Birth: 28-Sep-1990

## 2021-02-23 ENCOUNTER — Encounter (HOSPITAL_BASED_OUTPATIENT_CLINIC_OR_DEPARTMENT_OTHER): Payer: Self-pay | Admitting: Physical Therapy

## 2021-02-24 ENCOUNTER — Ambulatory Visit (HOSPITAL_BASED_OUTPATIENT_CLINIC_OR_DEPARTMENT_OTHER): Payer: 59 | Admitting: Physical Therapy

## 2021-02-25 ENCOUNTER — Encounter (HOSPITAL_BASED_OUTPATIENT_CLINIC_OR_DEPARTMENT_OTHER): Payer: Self-pay | Admitting: Nurse Practitioner

## 2021-02-25 ENCOUNTER — Other Ambulatory Visit: Payer: Self-pay

## 2021-02-25 ENCOUNTER — Ambulatory Visit (INDEPENDENT_AMBULATORY_CARE_PROVIDER_SITE_OTHER): Payer: 59 | Admitting: Nurse Practitioner

## 2021-02-25 VITALS — BP 120/81 | HR 86 | Ht 69.0 in | Wt 174.2 lb

## 2021-02-25 DIAGNOSIS — K649 Unspecified hemorrhoids: Secondary | ICD-10-CM | POA: Insufficient documentation

## 2021-02-25 DIAGNOSIS — Z79899 Other long term (current) drug therapy: Secondary | ICD-10-CM | POA: Insufficient documentation

## 2021-02-25 MED ORDER — NITROGLYCERIN 0.4 % RE OINT: 1.0000 [in_us] | TOPICAL_OINTMENT | Freq: Two times a day (BID) | RECTAL | 3 refills | Status: DC | PRN

## 2021-02-25 MED ORDER — LIDOCAINE-HYDROCORTISONE ACE 1-1 % EX CREA: TOPICAL_CREAM | CUTANEOUS | 3 refills | Status: DC

## 2021-02-25 NOTE — Progress Notes (Signed)
Acute Office Visit  Subjective:    Patient ID: Clinton Fisher, male    DOB: Nov 21, 1990, 30 y.o.   MRN: [id number]  Chief Complaint  Patient presents with   Hemorrhoids    Patient states he has been experiencing hemorrhoids for 6 months and now is having bleeding.    HPI Patient is in today for hemorrhoids.  Clinton Fisher tells me that he has had hemorrhoids for "a while" but recently he has started having more problems with them. He endorses pain with bowel movement and pain while sitting for long periods. He reports recently he noticed blood on the toilet paper when wiping and that concerned him. He does endorse intermittent straining with BM and recent increase in activity with PT. He denies large amounts of blood, dark tarry stools, abdominal pain, distention, blood in clothing or on underwear.   He is also requesting his lithium levels be drawn today. He was recently started on lithium in the hospital, but has not been followed with labs since discharge. He reports his mood is very well controlled and he feels good on this current dose of medication. He denies any side effects, ticks, or manic episodes.   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   Manic affective disorder with recurrent episode ([place])    Seasonal allergies    sore throat 07/29/2013    Past Surgical History:  Procedure Laterality Date   FOOT FOREIGN BODY REMOVAL Right 01/19/2011   KNEE ARTHROSCOPY Right 08/01/2013   Procedure: ARTHROSCOPY RIGHT KNEE, Chondroplasty, Excision of Plica;  Surgeon: Alta Corning, MD;  Location: [place];  Service: Orthopedics;  Laterality: Right;     Family History  Problem Relation Age of Onset   Depression Mother     Social History   Socioeconomic History   Marital status: Single    Spouse name: Not on file   Number of children: Not on file   Years of education: Not on file   Highest education level: Not on file  Occupational History   Not on file  Tobacco Use   Smoking status: Every Day   Smokeless tobacco: Never  Vaping Use   Vaping Use: Former  Substance and Sexual Activity   Alcohol use: Yes    Comment: every 3 days   Drug use: No   Sexual activity: Not Currently  Other Topics Concern   Not on file  Social History Narrative   Not on file   Social Determinants of Health   Financial Resource Strain: Not on file  Food Insecurity: Not on file  Transportation Needs: Not on file  Physical Activity: Not on file  Stress: Not on file  Social Connections: Not on file  Intimate Partner Violence: Not on file    Outpatient Medications Prior to Visit  Medication Sig Dispense Refill   benztropine (COGENTIN) 0.5 MG tablet Take 0.5 mg by mouth 2 (two) times daily.     fluPHENAZine (PROLIXIN) 10 MG tablet Take 1 tablet (10 mg total) by mouth 2 (two) times daily. (Patient taking differently: Take 10 mg by mouth daily.) 60 tablet 0   hydrOXYzine (  ATARAX/VISTARIL) 25 MG tablet Take 1 tablet (25 mg total) by mouth 3 (three) times daily as needed for anxiety. 14 tablet 0   lithium 300 MG tablet Take 300 mg by mouth daily.     lithium carbonate (LITHOBID) 300 MG CR tablet Take 1 tablet (300 mg total) by mouth at bedtime. 30 tablet 0   meloxicam (MOBIC) 7.5 MG tablet Take 1 tablet (7.5 mg total) by mouth daily. 30 tablet 5   prazosin (MINIPRESS) 1 MG capsule Take 1 mg by mouth at bedtime.     QUEtiapine (SEROQUEL) 100 MG tablet Take 1 tablet (100 mg total) by mouth at bedtime. (Patient taking differently: Take 25 mg by mouth at bedtime.) 30 tablet 0   QUEtiapine (SEROQUEL) 25 MG tablet Take 25 mg by mouth at bedtime.      No facility-administered medications prior to visit.    Allergies  Allergen Reactions   Trazodone And Nefazodone     Other reaction(s): Other priapism   Amoxicillin-Pot Clavulanate Diarrhea    Diarrhea     Review of Systems All review of systems negative except what is listed in the HPI     Objective:    Physical Exam Vitals and nursing note reviewed. Exam conducted with a chaperone present.  Constitutional:      Appearance: Normal appearance. He is normal weight.  HENT:     Head: Normocephalic and atraumatic.     Right Ear: Tympanic membrane normal.     Left Ear: Tympanic membrane normal.  Eyes:     Extraocular Movements: Extraocular movements intact.     Conjunctiva/sclera: Conjunctivae normal.     Pupils: Pupils are equal, round, and reactive to light.  Cardiovascular:     Rate and Rhythm: Normal rate and regular rhythm.     Pulses: Normal pulses.     Heart sounds: Normal heart sounds.  Pulmonary:     Effort: Pulmonary effort is normal.     Breath sounds: Normal breath sounds.  Abdominal:     General: Abdomen is flat. Bowel sounds are normal. There is no distension.     Palpations: Abdomen is soft. There is no mass.     Tenderness: There is no abdominal tenderness. There is no right CVA tenderness, left CVA tenderness, guarding or rebound.     Hernia: No hernia is present.  Genitourinary:    Rectum: External hemorrhoid present.  Musculoskeletal:        General: Normal range of motion.     Cervical back: Normal range of motion and neck supple.     Right lower leg: No edema.     Left lower leg: No edema.  Skin:    General: Skin is warm and dry.     Capillary Refill: Capillary refill takes less than 2 seconds.  Neurological:     General: No focal deficit present.     Mental Status: He is alert and oriented to person, place, and time.     Cranial Nerves: No cranial nerve deficit.     Sensory: No sensory deficit.     Motor: No weakness.     Gait: Gait  normal.  Psychiatric:        Mood and Affect: Mood normal.        Behavior: Behavior normal.        Thought Content: Thought content normal.        Judgment: Judgment normal.    BP 120/81   Pulse 86   Ht '5\' 9"'  (  1.753 m)   Wt 174 lb 3.2 oz (79 kg)   SpO2 98%   BMI 25.72 kg/m  Wt Readings from Last 3 Encounters:  02/25/21 174 lb 3.2 oz (79 kg)  01/11/21 179 lb 3.2 oz (81.3 kg)  12/09/20 172 lb (78 kg)    Health Maintenance Due  Topic Date Due   Pneumococcal Vaccine 62-70 Years old (1 - PCV) Never done   HIV Screening  Never done   Hepatitis C Screening  Never done   COVID-19 Vaccine (3 - Moderna risk series) 05/15/2020    There are no preventive care reminders to display for this patient.   Lab Results  Component Value Date   TSH 0.790 12/10/2020   Lab Results  Component Value Date   WBC 6.7 12/08/2020   HGB 14.7 12/08/2020   HCT 43.1 12/08/2020   MCV 89.0 12/08/2020   PLT 289 12/08/2020   Lab Results  Component Value Date   NA 139 12/08/2020   K 3.8 12/08/2020   CO2 27 12/08/2020   GLUCOSE 86 12/08/2020   BUN 14 12/08/2020   CREATININE 0.81 12/08/2020   BILITOT 0.8 12/08/2020   ALKPHOS 70 12/08/2020   AST 17 12/08/2020   ALT 18 12/08/2020   PROT 7.9 12/08/2020   ALBUMIN 4.8 12/08/2020   CALCIUM 9.2 12/08/2020   ANIONGAP 8 12/08/2020   Lab Results  Component Value Date   CHOL 139 12/10/2020   Lab Results  Component Value Date   HDL 41 12/10/2020   Lab Results  Component Value Date   LDLCALC 79 12/10/2020   Lab Results  Component Value Date   TRIG 93 12/10/2020   Lab Results  Component Value Date   CHOLHDL 3.4 12/10/2020   Lab Results  Component Value Date   HGBA1C 5.3 12/10/2020       Assessment & Plan:   Problem List Items Addressed This Visit     Hemorrhoids - Primary    Symptoms and presentation consistent with external hemorrhoids- non thrombosed.  Topical treatment with nitroglycerine and lidocaine/cortisone cream  recommended BID for symptom management.  Recommend utilizing witch hazel to clean rectum after BM and before applying medications.  Recommend application of medication with cotton tipped applicator to avoid getting on hands.  Discussion of prevention and treatment provided.  Will obtain CBC for evaluation of hemoglobin with reported blood loss, although, minor changes suspected.  F/U if sx worsen or fail to improve.        Relevant Medications   Lidocaine-Hydrocortisone Ace 1-1 % CREA   Nitroglycerin 0.4 % OINT   Other Relevant Orders   CBC with Differential/Platelet   High risk medication use    Lithium levels and liver enzymes monitored today.  Patient appears to be doing much better on current regimen than when first met.  Will forward medication to psychiatry for evaluation.        Relevant Orders   Lithium level   Comprehensive metabolic panel     Meds ordered this encounter  Medications   Lidocaine-Hydrocortisone Ace 1-1 % CREA    Sig: Apply 1/2-1 inch to rectum twice a day as needed for hemorrhoid relief (for up to 3 weeks).    Dispense:  30 g    Refill:  3   Nitroglycerin 0.4 % OINT    Sig: Place 1 inch rectally every 12 (twelve) hours as needed (for hemorrhoid. Use up to 3 weeks- apply with cotton tipped swab).    Dispense:  30 g    Refill:  3     Orma Render, NP

## 2021-02-25 NOTE — Patient Instructions (Signed)
I have called in two different creams for you to use.  Both of these can be used twice a day for pain and to help heal the hemorrhoids.  Apply about 1/2 inch to 1 inch of cream per use to the rectum with a q-tip to help spread the medication evenly.  You can use both of these up to three weeks straight. If your symptoms continue after three weeks (never go away) please let me know. If they go away, but then come back you can restart the cream.   I recommend you drink plenty of water and avoid straining for bowel movements to help prevent return.   If you have any questions, please let me know.

## 2021-02-25 NOTE — Assessment & Plan Note (Signed)
Symptoms and presentation consistent with external hemorrhoids- non thrombosed.  Topical treatment with nitroglycerine and lidocaine/cortisone cream recommended BID for symptom management.  Recommend utilizing witch hazel to clean rectum after BM and before applying medications.  Recommend application of medication with cotton tipped applicator to avoid getting on hands.  Discussion of prevention and treatment provided.  Will obtain CBC for evaluation of hemoglobin with reported blood loss, although, minor changes suspected.  F/U if sx worsen or fail to improve.

## 2021-02-25 NOTE — Assessment & Plan Note (Signed)
Lithium levels and liver enzymes monitored today.  Patient appears to be doing much better on current regimen than when first met.  Will forward medication to psychiatry for evaluation.

## 2021-02-26 LAB — CBC WITH DIFFERENTIAL/PLATELET
Basophils Absolute: 0.1 10*3/uL (ref 0.0–0.2)
Basos: 1 %
EOS (ABSOLUTE): 0.1 10*3/uL (ref 0.0–0.4)
Eos: 1 %
Hematocrit: 46.3 % (ref 37.5–51.0)
Hemoglobin: 16.2 g/dL (ref 13.0–17.7)
Immature Grans (Abs): 0 10*3/uL (ref 0.0–0.1)
Immature Granulocytes: 0 %
Lymphocytes Absolute: 1.9 10*3/uL (ref 0.7–3.1)
Lymphs: 18 %
MCH: 30.2 pg (ref 26.6–33.0)
MCHC: 35 g/dL (ref 31.5–35.7)
MCV: 86 fL (ref 79–97)
Monocytes Absolute: 0.6 10*3/uL (ref 0.1–0.9)
Monocytes: 6 %
Neutrophils Absolute: 7.7 10*3/uL — ABNORMAL HIGH (ref 1.4–7.0)
Neutrophils: 74 %
Platelets: 284 10*3/uL (ref 150–450)
RBC: 5.37 x10E6/uL (ref 4.14–5.80)
RDW: 11.9 % (ref 11.6–15.4)
WBC: 10.3 10*3/uL (ref 3.4–10.8)

## 2021-02-26 LAB — COMPREHENSIVE METABOLIC PANEL
ALT: 12 IU/L (ref 0–44)
AST: 13 IU/L (ref 0–40)
Albumin/Globulin Ratio: 2.3 — ABNORMAL HIGH (ref 1.2–2.2)
Albumin: 5.3 g/dL — ABNORMAL HIGH (ref 4.0–5.0)
Alkaline Phosphatase: 71 IU/L (ref 44–121)
BUN/Creatinine Ratio: 16 (ref 9–20)
BUN: 15 mg/dL (ref 6–20)
Bilirubin Total: 0.4 mg/dL (ref 0.0–1.2)
CO2: 24 mmol/L (ref 20–29)
Calcium: 9.5 mg/dL (ref 8.7–10.2)
Chloride: 101 mmol/L (ref 96–106)
Creatinine, Ser: 0.96 mg/dL (ref 0.76–1.27)
Globulin, Total: 2.3 g/dL (ref 1.5–4.5)
Glucose: 94 mg/dL (ref 65–99)
Potassium: 4 mmol/L (ref 3.5–5.2)
Sodium: 138 mmol/L (ref 134–144)
Total Protein: 7.6 g/dL (ref 6.0–8.5)
eGFR: 108 mL/min/{1.73_m2} (ref >59)

## 2021-02-26 LAB — LITHIUM LEVEL: Lithium Lvl: 0.2 mmol/L — ABNORMAL LOW (ref 0.5–1.2)

## 2021-02-28 ENCOUNTER — Telehealth (HOSPITAL_BASED_OUTPATIENT_CLINIC_OR_DEPARTMENT_OTHER): Payer: Self-pay

## 2021-02-28 ENCOUNTER — Other Ambulatory Visit: Payer: Self-pay

## 2021-02-28 ENCOUNTER — Other Ambulatory Visit (HOSPITAL_BASED_OUTPATIENT_CLINIC_OR_DEPARTMENT_OTHER): Payer: Self-pay | Admitting: Nurse Practitioner

## 2021-02-28 ENCOUNTER — Encounter (HOSPITAL_BASED_OUTPATIENT_CLINIC_OR_DEPARTMENT_OTHER): Payer: Self-pay | Admitting: Physical Therapy

## 2021-02-28 ENCOUNTER — Ambulatory Visit (HOSPITAL_BASED_OUTPATIENT_CLINIC_OR_DEPARTMENT_OTHER): Payer: 59 | Admitting: Physical Therapy

## 2021-02-28 DIAGNOSIS — R2689 Other abnormalities of gait and mobility: Secondary | ICD-10-CM

## 2021-02-28 DIAGNOSIS — G8929 Other chronic pain: Secondary | ICD-10-CM

## 2021-02-28 DIAGNOSIS — K649 Unspecified hemorrhoids: Secondary | ICD-10-CM

## 2021-02-28 DIAGNOSIS — M25561 Pain in right knee: Secondary | ICD-10-CM | POA: Diagnosis not present

## 2021-02-28 DIAGNOSIS — R296 Repeated falls: Secondary | ICD-10-CM

## 2021-02-28 MED ORDER — LIDOCAINE-HYDROCORTISONE ACE 1-1 % EX CREA
TOPICAL_CREAM | CUTANEOUS | 3 refills | Status: DC
Start: 2021-02-28 — End: 2021-04-04

## 2021-02-28 MED ORDER — NITROGLYCERIN 0.4 % RE OINT: 1.0000 [in_us] | TOPICAL_OINTMENT | Freq: Two times a day (BID) | RECTAL | 3 refills | Status: DC | PRN

## 2021-02-28 NOTE — Telephone Encounter (Signed)
Spoke with patient's mother per DPR.  She reports [facility] [address] does not have lidocaine-hydrocortisone or nitroglycerin and wants the [facility] to be [facility] [place].

## 2021-02-28 NOTE — Telephone Encounter (Signed)
Called and spoke with patient's mother per DPR to inform her the medication requested has been sent to the pharmacy.  She now reports the original pharmacy does not carry the medication prescribed, but does not remember which one it is.  She will go pick up whichever prescription the pharmacy has and will call back to have a new prescription sent in.

## 2021-03-01 ENCOUNTER — Encounter (HOSPITAL_BASED_OUTPATIENT_CLINIC_OR_DEPARTMENT_OTHER): Payer: Self-pay | Admitting: Physical Therapy

## 2021-03-01 NOTE — Progress Notes (Signed)
Please call patient to find out who he is seeing for psychiatry (name and phone number) so we can send the lab results to the provider for review.  Once received- please fax results to provider.   Lithium levels are still showing on the lower side, but will leave changes up to psychiatry based on their evaluation of his current progress and symptoms.

## 2021-03-01 NOTE — Therapy (Signed)
[facility] [address] [place], [place], [id number] Phone: [phone]   Fax:  [phone]  Physical Therapy Treatment  Patient Details  Name: Clinton Fisher MRN: [id number] Date of Birth: [birth year] Referring Provider (PT): Georgeanne NimSarah Early NP   Encounter Date: 02/28/2021   PT End of Session - 02/28/21 1557     Visit Number 10    Number of Visits 18    Date for PT Re-Evaluation 03/28/21    Authorization Type [employer] 2022    PT Start Time 1512    PT Stop Time 1556    PT Time Calculation (min) 44 min    Equipment Utilized During Treatment Other (comment)    Activity Tolerance Patient tolerated treatment well    Behavior [facility] Therapy [facility] for tasks assessed/performed             Past Medical History:  Diagnosis Date   Abrasion of index finger 07/29/2013   left   Bipolar I disorder, current or most recent episode manic, severe (HCC) 12/14/2020   Bipolar I disorder, current or most recent episode manic, with psychotic features (HCC) 12/14/2020   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   Manic affective disorder with recurrent episode (HCC)    Seasonal allergies    sore throat 07/29/2013    Past Surgical History:  Procedure Laterality Date   FOOT FOREIGN BODY REMOVAL Right 01/19/2011   KNEE ARTHROSCOPY Right 08/01/2013   Procedure: ARTHROSCOPY RIGHT KNEE, Chondroplasty, Excision of Plica;  Surgeon: Harvie JuniorJohn L Graves, MD;  Location: [facility];  Service: Orthopedics;  Laterality: Right;    There were no vitals filed for this visit.   Subjective Assessment - 02/28/21 1516     Subjective Patient states he is feeling sore. Patient stated he missed last visit due to hemmoroid and states he is feeling better since event. Patient states he feels good about HEP right now.    Pertinent History orthoscoptic surgery for overworked 8 years ago,  X-ray no OA.    Limitations Lifting;Standing;Walking    How long can you sit comfortably? n/a    How long can you stand comfortably? all day but limited    How long can you walk comfortably? 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 Yes    Pain Score 5     Pain Location Knee    Pain Orientation Right    Pain Descriptors / Indicators Aching    Pain Type Chronic pain    Pain Onset More than a month ago    Pain Frequency Intermittent    Aggravating Factors  standing and walking    Pain Relieving Factors medication and rest    Effect of Pain on Daily Activities cannot work    Multiple Pain Sites No                               [facility] Adult PT Treatment/Exercise - 03/01/21 0001       Knee/Hip Exercises: Aerobic   Nustep 5 mins level 2      Knee/Hip Exercises: Standing   Heel Raises Both;2 sets;10 reps    Other Standing Knee Exercises slow standing march 3x10 with 3 second hold for balance; heel raise x20  Other Standing Knee Exercises forward hurdles with no hand on rail 8x3hdles, side step with hurdles with no hand on rails 8x3hdles, forward and backward hurdles with no hands on rail 8 reps      Knee/Hip Exercises: Seated   Long Arc Quad Strengthening;Both;2 sets;10 reps    Long Arc Quad Limitations green band      Knee/Hip Exercises: Supine   Bridges Strengthening;Both;2 [employer] Limitations green band, 2x10    Other Supine Knee/Hip Exercises clam shells with green band, 2x15                    PT Education - 02/28/21 1559     Education Details Reviewed HEP and progress since intial evaluation.    Person(s) Educated Patient    Methods Explanation;Demonstration;Verbal cues    Comprehension Verbalized understanding;Returned demonstration;Verbal cues required              PT Short Term Goals - 02/28/21 1600       PT SHORT TERM GOAL #1   Title  Patient will report reduced pain to a 4/10 for knee pain.    Baseline 5/10    Period Weeks    Status On-going    Target Date 03/14/21      PT SHORT TERM GOAL #2   Title Patient will increase bilateral hip flexion strength to 4+/5    Baseline 4/5    Time 2    Period Weeks    Status On-going    Target Date 03/14/21               PT Long Term Goals - 02/28/21 1601       PT LONG TERM GOAL #1   Title Patient will increase strength of bilateral hip 5/5 throughout to improve endurance and decrease risk of falls during ADL's.    Baseline 4+/5 throughout, hip flexion 4/5    Time 6    Period Weeks    Status On-going    Target Date 03/28/21      PT LONG TERM GOAL #2   Title Patient will improve gait mechanics in order to ambulate 1000 feet with no fatigue or pain in right knee.    Baseline slow cadence, Trendelenburg gait, decreased hip flexion during swing phase    Time 6    Period Weeks    Status Achieved    Target Date 06/06/21      PT LONG TERM GOAL #3   Title Patient will have full understanding of final HEP plan to perform thera-ex at home without supervision to be able to go to work.    Baseline Understanding of HEP but still requires verbal cues    Time 6    Period Weeks    Status On-going    Target Date 03/28/21      PT LONG TERM GOAL #4   Title Patient will improve six minute walk test in order to ambulate 1500 feet    Baseline 1114    Time 4    Period Weeks    Status New    Target Date 03/28/21                   Plan - 02/28/21 1605     Clinical Impression Statement Patient tolerated treatment well with no increase in pain or discomfort. Patient is able to complete exercise with out tactile cues but some verbal cues are needed for more difficult exercises. Patient's strength has  improved overall in hip and knee strength to 4+/5 throughout bilaterally with hip flexion at 4/5. Patient also completed 6 minute walk test with score of 1114 feet with no  breaks needed. Patient will continue to benefit from therapy in order to continue to progress HEP to increase strength and endurance of lower extremity. We will continue 2x4W with therapy.    Personal Factors and Comorbidities Comorbidity 2;Time since onset of injury/illness/exacerbation;Fitness    Comorbidities chronic pain, GAD, Bipolar 1    Examination-Activity Limitations Stairs;Squat;Stand;Lift;Transfers    Examination-Participation Restrictions Nurse, adult;Yard [employer]    Stability/Clinical Decision Making Stable/Uncomplicated    Rehab Potential Good    PT Frequency 2x / week    PT Duration 6 weeks    PT Treatment/Interventions Aquatic Therapy;ADLs/Self Care Home Management;Electrical Stimulation;Cryotherapy;Iontophoresis 4mg /ml Dexamethasone;Gait training;Stair training;Contrast Bath;Ultrasound;Therapeutic activities;Therapeutic exercise;Balance training;Neuromuscular re-education;Patient/family education;Functional mobility training;Passive range of motion;Manual techniques;Taping;Vasopneumatic Device;Joint Manipulations    PT Next Visit Plan Implement hip and knee strength focused HEP, work on gait abnormalites (focus on increasing hip flexion, use of calves during push off), implement stretching exercises, aquatic therapy to improve balance and strength in LE to reduce risk of falls. Sit to stands and more functional exercies. weighted exercises    PT Home Exercise Plan Access Code: [email or web address]  URL: [email or web address]  Date: 02/02/2021  Prepared by: 02/04/2021    Exercises  Supine Quad Set - 1 x daily - 7 x weekly - 3 sets - 10 reps  Supine Knee Extension Strengthening - 1 x daily - 7 x weekly - 3 sets - 10 reps  Hooklying Clamshell with Resistance - 1 x daily - 7 x weekly - 3 sets - 10 reps  Supine Bridge with Resistance Band - 1 x daily - 7 x weekly - 3 sets - 10 reps  Standing Hamstring Stretch with Step - 1 x daily - 7 x weekly - 3 sets -  10 reps  Supine Gluteus Stretch - 1 x daily - 7 x weekly - 3 sets - 10 reps  Lorayne Bender with Strap - 1 x daily - 7 x weekly - 3 sets - 10 reps, sit to stand 3x10, knee extension with band 3x10, hurdles for balance 3x10,    Consulted and Agree with Plan of Care Patient             Patient will benefit from skilled therapeutic intervention in order to improve the following deficits and impairments:  Abnormal gait, Decreased endurance, Decreased strength, Difficulty walking, Decreased balance, Decreased mobility, Increased fascial restricitons, Increased muscle spasms, Pain, Impaired perceived functional ability, Decreased coordination, Postural dysfunction, Improper body mechanics  Visit Diagnosis: Chronic pain of right knee  Other abnormalities of gait and mobility  Repeated falls     Problem List Patient Active Problem List   Diagnosis Date Noted   Hemorrhoids 02/25/2021   High risk medication use 02/25/2021   Chronic pain of right knee 01/11/2021   Encounter for medical examination to establish care 01/11/2021   Bipolar 1 disorder (HCC) 10/18/2018   Cannabis dependence, continuous (HCC) 10/18/2018   Tobacco use disorder 08/20/2017   GAD (generalized anxiety disorder) 09/25/2016    11/25/2016 PT DPT  03/01/2021, 10:35 AM  05/01/2021  03/01/2021   During this treatment session, the therapist was present, participating in and directing the treatment.    [facility] [address] [place], [place], [place] Phone: [phone]   Fax:  [phone]  Name: Clinton Fisher MRN: [id number] Date of Birth: 1990/12/07

## 2021-03-02 ENCOUNTER — Encounter (HOSPITAL_BASED_OUTPATIENT_CLINIC_OR_DEPARTMENT_OTHER): Payer: Self-pay | Admitting: Physical Therapy

## 2021-03-03 ENCOUNTER — Ambulatory Visit (HOSPITAL_BASED_OUTPATIENT_CLINIC_OR_DEPARTMENT_OTHER): Payer: 59 | Admitting: Physical Therapy

## 2021-03-03 ENCOUNTER — Encounter (HOSPITAL_BASED_OUTPATIENT_CLINIC_OR_DEPARTMENT_OTHER): Payer: Self-pay | Admitting: Physical Therapy

## 2021-03-03 ENCOUNTER — Other Ambulatory Visit: Payer: Self-pay

## 2021-03-03 ENCOUNTER — Telehealth (HOSPITAL_BASED_OUTPATIENT_CLINIC_OR_DEPARTMENT_OTHER): Payer: Self-pay

## 2021-03-03 DIAGNOSIS — R296 Repeated falls: Secondary | ICD-10-CM

## 2021-03-03 DIAGNOSIS — G8929 Other chronic pain: Secondary | ICD-10-CM

## 2021-03-03 DIAGNOSIS — M25561 Pain in right knee: Secondary | ICD-10-CM | POA: Diagnosis not present

## 2021-03-03 DIAGNOSIS — R2689 Other abnormalities of gait and mobility: Secondary | ICD-10-CM

## 2021-03-03 NOTE — Telephone Encounter (Signed)
Called patient to discuss lab results and to ask who he is seeing for psychiatry. Per DPR spoke with patient's mother. Patient is seeing Tyler Aas with [facility] [phone]. Called [place] and left a message for Tyler Aas to return a call to me.

## 2021-03-03 NOTE — Therapy (Addendum)
[facility] [address] [place], [place], [address] Phone: [phone]   Fax:  [phone]  Physical Therapy Treatment  Patient Details  Name: Clinton Fisher MRN: [id number] Date of Birth: [birth year] Referring Provider (PT): Georgeanne Nim NP   Encounter Date: 03/03/2021   PT End of Session - 03/03/21 1546     Visit Number 11    Number of Visits 18    Date for PT Re-Evaluation 03/28/21    Authorization Type [employer] 2022    PT Start Time 1530    PT Stop Time 1605    PT Time Calculation (min) 35 min    Activity Tolerance Patient tolerated treatment well    Behavior During Therapy [facility] for tasks assessed/performed             Past Medical History:  Diagnosis Date   Abrasion of index finger 07/29/2013   left   Bipolar I disorder, current or most recent episode manic, severe (HCC) 12/14/2020   Bipolar I disorder, current or most recent episode manic, with psychotic features (HCC) 12/14/2020   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   Manic affective disorder with recurrent episode (HCC)    Seasonal allergies    sore throat 07/29/2013    Past Surgical History:  Procedure Laterality Date   FOOT FOREIGN BODY REMOVAL Right 01/19/2011   KNEE ARTHROSCOPY Right 08/01/2013   Procedure: ARTHROSCOPY RIGHT KNEE, Chondroplasty, Excision of Plica;  Surgeon: Harvie Junior, MD;  Location: [facility];  Service: Orthopedics;  Laterality: Right;    There were no vitals filed for this visit.   Subjective Assessment - 03/03/21 1534     Subjective Pt reports he was sore after prior Rx.  He reports minimally improved strength.  Pt states he feels susceptible to falling due to his balance.  Pt has difficulty getting in/out of tub.  Pt requested to walk laps and warm up today instead of doing Nu-step.  Pt states it's easy to perform LAQ with RTB but very  challenging with GTB.  Pt reports compliance with HEP.    Pertinent History arthroscopic surgery for overworked 8 years ago, X-ray no OA.    Currently in Pain? Yes    Pain Score 6     Pain Location Knee    Pain Orientation Right                               [facility] Adult PT Treatment/Exercise - 03/03/21 0001       Neuro Re-ed    Neuro Re-ed Details  See below for marching on airex and hurdle walks to improve functional stability, proprioception, and kinesthetic awareness with daily tasks and mobility.      Exercises   Exercises Knee/Hip   Reviewed response to prior Rx, pain level, and current function.     Knee/Hip Exercises: Aerobic   Other Aerobic Pt ambulated 3 laps (900 ft) continuously.      Knee/Hip Exercises: Standing   Other Standing Knee Exercises slow standing march 3x10 with 3 second hold for balance (1 set on floor and 2 sets on airex); heel raise x20    Other Standing Knee Exercises forward hurdles with no hand on rail 8x3hdles, side step with hurdles with no hand on rails 8x3hdles, forward without UE assist and backward hurdles with  UE assist on rails on rail 8 reps      Knee/Hip Exercises: Seated   Long Arc Quad Strengthening;Both;2 sets;10 reps    Long Arc Quad Limitations green band      Knee/Hip Exercises: Supine   Bridges Strengthening;Both;2 sets    Bridges Limitations green band, 2x10                    PT Education - 03/03/21 2301     Education Details Educated pt concerning POC.  Instructed pt in correct form with exercises.    Person(s) Educated Patient    Methods Explanation;Demonstration;Verbal cues    Comprehension Returned demonstration;Verbalized understanding              PT Short Term Goals - 02/28/21 1600       PT SHORT TERM GOAL #1   Title Patient will report reduced pain to a 4/10 for knee pain.    Baseline 5/10    Period Weeks    Status On-going    Target Date 03/14/21      PT SHORT TERM GOAL #2    Title Patient will increase bilateral hip flexion strength to 4+/5    Baseline 4/5    Time 2    Period Weeks    Status On-going    Target Date 03/14/21               PT Long Term Goals - 02/28/21 1601       PT LONG TERM GOAL #1   Title Patient will increase strength of bilateral hip 5/5 throughout to improve endurance and decrease risk of falls during ADL's.    Baseline 4+/5 throughout, hip flexion 4/5    Time 6    Period Weeks    Status On-going    Target Date 03/28/21      PT LONG TERM GOAL #2   Title Patient will improve gait mechanics in order to ambulate 1000 feet with no fatigue or pain in right knee.    Baseline slow cadence, Trendelenburg gait, decreased hip flexion during swing phase    Time 6    Period Weeks    Status Achieved    Target Date 06/06/21      PT LONG TERM GOAL #3   Title Patient will have full understanding of final HEP plan to perform thera-ex at home without supervision to be able to go to work.    Baseline Understanding of HEP but still requires verbal cues    Time 6    Period Weeks    Status On-going    Target Date 03/28/21      PT LONG TERM GOAL #4   Title Patient will improve six minute walk test in order to ambulate 1500 feet    Baseline 1114    Time 4    Period Weeks    Status New    Target Date 03/28/21                   Plan - 03/03/21 2303     Clinical Impression Statement Pt presents to Rx reporting minimal improvement in strength.  PT walked laps with pt instead of pt performing the Nustep due to pt request.  He performed exercises well without c/o's with cuing for correct form.  Pt gave good effort with all exercises.  He responded well to Rx having no increased pain after Rx stating he felt good.  Pt should cont to benefit from cont  skilled PT services to address ongoing goals, improve strength, and improve function.    Comorbidities chronic pain, GAD, Bipolar 1    PT Treatment/Interventions Aquatic  Therapy;ADLs/Self Care Home Management;Electrical Stimulation;Cryotherapy;Iontophoresis 4mg /ml Dexamethasone;Gait training;Stair training;Contrast Bath;Ultrasound;Therapeutic activities;Therapeutic exercise;Balance training;Neuromuscular re-education;Patient/family education;Functional mobility training;Passive range of motion;Manual techniques;Taping;Vasopneumatic Device;Joint Manipulations    PT Next Visit Plan Cont with ther ex and neuro re-ed activites to improve LE strength, functional stability and kinesthetic awareness, and functional mobility.    PT Home Exercise Plan Access Code: [email or web address]  URL: [email or web address]    Consulted and Agree with Plan of Care Patient             Patient will benefit from skilled therapeutic intervention in order to improve the following deficits and impairments:  Abnormal gait, Decreased endurance, Decreased strength, Difficulty walking, Decreased balance, Decreased mobility, Increased fascial restricitons, Increased muscle spasms, Pain, Impaired perceived functional ability, Decreased coordination, Postural dysfunction, Improper body mechanics  Visit Diagnosis: Chronic pain of right knee  Other abnormalities of gait and mobility  Repeated falls     Problem List Patient Active Problem List   Diagnosis Date Noted   Hemorrhoids 02/25/2021   High risk medication use 02/25/2021   Chronic pain of right knee 01/11/2021   Encounter for medical examination to establish care 01/11/2021   Bipolar 1 disorder (HCC) 10/18/2018   Cannabis dependence, continuous (HCC) 10/18/2018   Tobacco use disorder 08/20/2017   GAD (generalized anxiety disorder) 09/25/2016    11/25/2016 III PT, DPT 03/03/21 11:22 PM  PHYSICAL THERAPY DISCHARGE SUMMARY  Visits from Start of Care: 11  Current functional level related to goals / functional outcomes: See above   Remaining deficits: See above   Education / Equipment: See above   Pt was last  seen on 03/03/2021 and cancelled the following 3 appointments.  Pt will be considered discharged at this time.    05/03/2021 III PT, DPT 07/30/21 10:44 AM  [facility] [address] [place], [place], [place] Phone: [phone]   Fax:  [phone]  Name: Clinton Fisher MRN: Rennis Petty Date of Birth: [birth year]

## 2021-03-03 NOTE — Telephone Encounter (Signed)
-----   Message from Tollie Eth, NP sent at 03/01/2021  8:28 AM EDT ----- Please call patient to find out who he is seeing for psychiatry (name and phone number) so we can send the lab results to the provider for review.  Once received- please fax results to provider.   Lithium levels are still showing on the lower side, but will leave changes up to psychiatry based on their evaluation of his current progress and symptoms.

## 2021-03-07 ENCOUNTER — Ambulatory Visit (HOSPITAL_BASED_OUTPATIENT_CLINIC_OR_DEPARTMENT_OTHER): Payer: 59 | Admitting: Physical Therapy

## 2021-03-07 ENCOUNTER — Telehealth (HOSPITAL_BASED_OUTPATIENT_CLINIC_OR_DEPARTMENT_OTHER): Payer: Self-pay

## 2021-03-07 NOTE — Telephone Encounter (Signed)
Patient called to request his lithium lab results  be faxed to [facility] at [phone].  No further information was given.

## 2021-03-09 ENCOUNTER — Encounter (HOSPITAL_BASED_OUTPATIENT_CLINIC_OR_DEPARTMENT_OTHER): Payer: Self-pay | Admitting: Physical Therapy

## 2021-03-10 ENCOUNTER — Ambulatory Visit (HOSPITAL_BASED_OUTPATIENT_CLINIC_OR_DEPARTMENT_OTHER): Payer: 59 | Admitting: Physical Therapy

## 2021-03-18 ENCOUNTER — Other Ambulatory Visit: Payer: Self-pay

## 2021-03-18 ENCOUNTER — Ambulatory Visit (INDEPENDENT_AMBULATORY_CARE_PROVIDER_SITE_OTHER): Payer: 59 | Admitting: Nurse Practitioner

## 2021-03-18 ENCOUNTER — Encounter (HOSPITAL_BASED_OUTPATIENT_CLINIC_OR_DEPARTMENT_OTHER): Payer: Self-pay | Admitting: Nurse Practitioner

## 2021-03-18 VITALS — BP 113/79 | HR 79 | Ht 69.0 in | Wt 182.0 lb

## 2021-03-18 DIAGNOSIS — K921 Melena: Secondary | ICD-10-CM | POA: Diagnosis not present

## 2021-03-18 DIAGNOSIS — K602 Anal fissure, unspecified: Secondary | ICD-10-CM | POA: Insufficient documentation

## 2021-03-18 DIAGNOSIS — K6289 Other specified diseases of anus and rectum: Secondary | ICD-10-CM

## 2021-03-18 DIAGNOSIS — K649 Unspecified hemorrhoids: Secondary | ICD-10-CM | POA: Diagnosis not present

## 2021-03-18 HISTORY — DX: Anal fissure, unspecified: K60.2

## 2021-03-18 NOTE — Assessment & Plan Note (Signed)
Anal fissure present at the 6 o'clock position with no signs of active bleeding or infection present.  Recommend continued use of topical treatments until seen by GI.  Recommend Miralax twice a day for 3-4 days to facilitate soft stools and then reduce to once daily or once every other day use to maintain soft stools and prevent straining.  GI referral placed for suspected internal hemorrhoids.

## 2021-03-18 NOTE — Assessment & Plan Note (Addendum)
External hemorrhoids resolved with continued symptoms consistent with internal hemorrhoids.  Unable to visualize or palpate at this time.  Given the patients ongoing symptoms, discussed the option for GI referral for evaluation and management to determine if further work-up or treatment can be done.  No alarm symptoms present today. Anal fissure is present, with no signs of active bleeding or infection.  Recommend continue current medication, and add Miralax twice a day for 3-4 days to facilitate soft stools then cut back to once a day or once every other day to maintain soft stools.  GI referral placed

## 2021-03-18 NOTE — Patient Instructions (Addendum)
I have sent a referral to GI for them to evaluate the hemorrhoids and see if they are able to remove them. Be sure to answer your phone for strange numbers for the next few days because they will be calling you to set up this appointment.    I would like you to start using Miralax twice a day- mix 1 capful (or one packet of the sample I gave you) with a drink of your choice for the next 3-4 days or until your stools are soft and you do not have to strain to have a bowel movement. Once your stools are soft, you can cut this back to once a day or once every other day to keep them soft. If you feel like they are hard to pass again, then increase the dosing.   Continue to use the medicine sent to the pharmacy to help with the pain for now.

## 2021-03-18 NOTE — Progress Notes (Signed)
Acute Office Visit  Subjective:    Patient ID: Clinton Fisher, male    DOB: 1990/07/29, 30 y.o.   MRN: [id number]  Chief Complaint  Patient presents with   Hemorrhoids    Patient states this is his follow up appointment for hemorrhoids.  Patient states the hemorrhoids are slightly better, but not much.  Prescribed medication did not work.  Pain is described as 3-4.    HPI Patient is in today for hemorrhoid follow-up.  Hemorrhoids: Patient complains of follow up of hemorrhoids. Onset of symptoms was 6 months ago ago with unchanged course since that time.  He describes symptoms as anorectal itching, bleeding which only occurs with bowel movements, constipation, and painful defecation. Treatment to date has been OTC creams: not very effective stool softeners: not very effective. Patient denies family hx of colorectal CA, history of previous STDs, known or suspected STD exposure, receptive anal intercourse, and weight loss.  Patient does have rectal bleeding occurring 7 times per week. Bleeding is described as water in bowl turns pinkish. Patient is having severe pain with bowel movements. Patient denies a personal history of colon cancer. Patient denies a personal history of IBD.    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   Manic affective disorder with recurrent episode ([place])    Seasonal allergies    sore throat 07/29/2013    Past Surgical History:  Procedure Laterality Date   FOOT FOREIGN BODY REMOVAL Right 01/19/2011   KNEE ARTHROSCOPY Right 08/01/2013   Procedure: ARTHROSCOPY RIGHT KNEE, Chondroplasty, Excision of Plica;  Surgeon: Alta Corning, MD;   Location: [facility];  Service: Orthopedics;  Laterality: Right;    Family History  Problem Relation Age of Onset   Depression Mother     Social History   Socioeconomic History   Marital status: Single    Spouse name: Not on file   Number of children: Not on file   Years of education: Not on file   Highest education level: Not on file  Occupational History   Not on file  Tobacco Use   Smoking status: Every Day   Smokeless tobacco: Never  Vaping Use   Vaping Use: Former  Substance and Sexual Activity   Alcohol use: Yes    Comment: every 3 days   Drug use: No   Sexual activity: Not Currently  Other Topics Concern   Not on file  Social History Narrative   Not on file   Social Determinants of Health   Financial Resource Strain: Not on file  Food Insecurity: Not on file  Transportation Needs: Not on file  Physical Activity: Not on file  Stress: Not on file  Social Connections: Not on file  Intimate Partner Violence: Not on file    Outpatient Medications Prior to Visit  Medication Sig Dispense Refill   benztropine (COGENTIN) 0.5 MG tablet Take 0.5 mg by mouth 2 (two) times daily.     fluPHENAZine (PROLIXIN) 10 MG tablet Take 1 tablet (10 mg total) by mouth 2 (two) times daily. (Patient taking differently: Take 10 mg by mouth daily.) 60 tablet 0  hydrOXYzine (ATARAX/VISTARIL) 25 MG tablet Take 1 tablet (25 mg total) by mouth 3 (three) times daily as needed for anxiety. 14 tablet 0   Lidocaine-Hydrocortisone Ace 1-1 % CREA Apply 1/2-1 inch to rectum twice a day as needed for hemorrhoid relief (for up to 3 weeks). 30 g 3   lithium 300 MG tablet Take 300 mg by mouth daily.     lithium carbonate (LITHOBID) 300 MG CR tablet Take 1 tablet (300 mg total) by mouth at bedtime. 30 tablet 0   meloxicam (MOBIC) 7.5 MG tablet Take 1 tablet (7.5 mg total) by mouth daily. 30 tablet 5   Nitroglycerin 0.4 % OINT Place 1 inch rectally every 12 (twelve) hours as needed (for  hemorrhoid. Use up to 3 weeks- apply with cotton tipped swab). 30 g 3   prazosin (MINIPRESS) 1 MG capsule Take 1 mg by mouth at bedtime.     QUEtiapine (SEROQUEL) 100 MG tablet Take 1 tablet (100 mg total) by mouth at bedtime. (Patient taking differently: Take 25 mg by mouth at bedtime.) 30 tablet 0   No facility-administered medications prior to visit.    Allergies  Allergen Reactions   Trazodone And Nefazodone     Other reaction(s): Other priapism   Amoxicillin    Amoxicillin-Pot Clavulanate Diarrhea    Diarrhea    Clavulanic Acid     Review of Systems All review of systems negative except what is listed in the HPI     Objective:    Physical Exam Vitals and nursing note reviewed. Exam conducted with a chaperone present.  Constitutional:      General: He is not in acute distress.    Appearance: Normal appearance.  Eyes:     Extraocular Movements: Extraocular movements intact.     Conjunctiva/sclera: Conjunctivae normal.     Pupils: Pupils are equal, round, and reactive to light.  Cardiovascular:     Rate and Rhythm: Normal rate.     Pulses: Normal pulses.  Pulmonary:     Effort: Pulmonary effort is normal.  Abdominal:     General: Abdomen is flat. Bowel sounds are normal. There is no distension.     Palpations: Abdomen is soft. There is no mass.     Tenderness: There is no abdominal tenderness. There is no guarding.  Genitourinary:    Rectum: Guaiac result negative. Tenderness and anal fissure present. No mass or external hemorrhoid. Normal anal tone.     Comments: No visible hemorrhoids present at this time. Anal fissure present at the 6 o'clock position. No palpable changes in rectal tone. No active bleeding present.  Musculoskeletal:        General: Normal range of motion.  Skin:    General: Skin is warm and dry.     Capillary Refill: Capillary refill takes less than 2 seconds.  Neurological:     General: No focal deficit present.     Mental Status: He is  alert and oriented to person, place, and time.  Psychiatric:        Mood and Affect: Mood normal.        Behavior: Behavior normal.        Thought Content: Thought content normal.        Judgment: Judgment normal.    BP 113/79   Pulse 79   Ht _0  (1.753 m)   Wt 182 lb (82.6 kg)   SpO2 99%   BMI 26.88 kg/m  Wt Readings from Last 3 Encounters:  03/18/21  182 lb (82.6 kg)  02/25/21 174 lb 3.2 oz (79 kg)  01/11/21 179 lb 3.2 oz (81.3 kg)    Health Maintenance Due  Topic Date Due   Pneumococcal Vaccine 13-69 Years old (1 - PCV) Never done   HIV Screening  Never done   Hepatitis C Screening  Never done   COVID-19 Vaccine (3 - Moderna risk series) 05/15/2020    There are no preventive care reminders to display for this patient.   Lab Results  Component Value Date   TSH 0.790 12/10/2020   Lab Results  Component Value Date   WBC 10.3 02/25/2021   HGB 16.2 02/25/2021   HCT 46.3 02/25/2021   MCV 86 02/25/2021   PLT 284 02/25/2021   Lab Results  Component Value Date   NA 138 02/25/2021   K 4.0 02/25/2021   CO2 24 02/25/2021   GLUCOSE 94 02/25/2021   BUN 15 02/25/2021   CREATININE 0.96 02/25/2021   BILITOT 0.4 02/25/2021   ALKPHOS 71 02/25/2021   AST 13 02/25/2021   ALT 12 02/25/2021   PROT 7.6 02/25/2021   ALBUMIN 5.3 (H) 02/25/2021   CALCIUM 9.5 02/25/2021   ANIONGAP 8 12/08/2020   EGFR 108 02/25/2021   Lab Results  Component Value Date   CHOL 139 12/10/2020   Lab Results  Component Value Date   HDL 41 12/10/2020   Lab Results  Component Value Date   LDLCALC 79 12/10/2020   Lab Results  Component Value Date   TRIG 93 12/10/2020   Lab Results  Component Value Date   CHOLHDL 3.4 12/10/2020   Lab Results  Component Value Date   HGBA1C 5.3 12/10/2020       Assessment & Plan:   Problem List Items Addressed This Visit     Hemorrhoids    External hemorrhoids resolved with continued symptoms consistent with internal hemorrhoids.  Unable to  visualize or palpate at this time.  Given the patients ongoing symptoms, discussed the option for GI referral for evaluation and management to determine if further work-up or treatment can be done.  No alarm symptoms present today. Anal fissure is present, with no signs of active bleeding or infection.  Recommend continue current medication, and add Miralax twice a day for 3-4 days to facilitate soft stools then cut back to once a day or once every other day to maintain soft stools.  GI referral placed      Relevant Orders   Ambulatory referral to Gastroenterology   Fissure, anal - Primary    Anal fissure present at the 6 o'clock position with no signs of active bleeding or infection present.  Recommend continued use of topical treatments until seen by GI.  Recommend Miralax twice a day for 3-4 days to facilitate soft stools and then reduce to once daily or once every other day use to maintain soft stools and prevent straining.  GI referral placed for suspected internal hemorrhoids.       Relevant Orders   Ambulatory referral to Gastroenterology   Other Visit Diagnoses     Pain, rectal       Relevant Orders   Ambulatory referral to Gastroenterology   Symptom of blood in stool       Relevant Orders   Ambulatory referral to Gastroenterology        No orders of the defined types were placed in this encounter.    Orma Render, NP

## 2021-03-21 ENCOUNTER — Telehealth (HOSPITAL_BASED_OUTPATIENT_CLINIC_OR_DEPARTMENT_OTHER): Payer: Self-pay

## 2021-03-21 NOTE — Telephone Encounter (Signed)
Per DPR spoke with patient's mother regarding GI referral.

## 2021-04-04 ENCOUNTER — Encounter: Payer: Self-pay | Admitting: Gastroenterology

## 2021-04-04 ENCOUNTER — Ambulatory Visit (INDEPENDENT_AMBULATORY_CARE_PROVIDER_SITE_OTHER): Payer: 59 | Admitting: Gastroenterology

## 2021-04-04 VITALS — BP 124/82 | HR 94 | Ht 69.0 in | Wt 182.1 lb

## 2021-04-04 DIAGNOSIS — K921 Melena: Secondary | ICD-10-CM | POA: Diagnosis not present

## 2021-04-04 DIAGNOSIS — K5900 Constipation, unspecified: Secondary | ICD-10-CM | POA: Diagnosis not present

## 2021-04-04 MED ORDER — PLENVU 140 G PO SOLR: ORAL | 0 refills | Status: DC

## 2021-04-04 NOTE — Patient Instructions (Signed)
If you are age 31 or older, your body mass index should be between 23-30. Your Body mass index is 26.9 kg/m. If this is out of the aforementioned range listed, please consider follow up with your Primary Care Provider.  If you are age 32 or younger, your body mass index should be between 19-25. Your Body mass index is 26.9 kg/m. If this is out of the aformentioned range listed, please consider follow up with your Primary Care Provider.   You have been scheduled for a colonoscopy. Please follow written instructions given to you at your visit today.  Please pick up your prep supplies at the pharmacy within the next 1-3 days. If you use inhalers (even only as needed), please bring them with you on the day of your procedure.  The  GI providers would like to encourage you to use [facility] to communicate with providers for non-urgent requests or questions.  Due to long hold times on the telephone, sending your provider a message by [facility] may be a faster and more efficient way to get a response.  Please allow 48 business hours for a response.  Please remember that this is for non-urgent requests.   It was a pleasure to see you today!  Thank you for trusting me with your gastrointestinal care!    Scott E. Tomasa Rand, MD

## 2021-04-04 NOTE — Progress Notes (Signed)
HPI : Clinton Fisher is a very pleasant 31 year old male with a history of bipolar disorder who was referred to [place] by Georgeanne Nim NP for further evaluation of hematochezia and anal fissure.  The patient states he started seeing blood in his stool about 6 months ago.  Recently, he has been seeing blood with about every bowel movement.  The blood is bright red and sometimes profuse.  He also has pain with the passage of stool as well as perianal pain and itching in the absence of bowel movements.  He also has problems with constipation, manifested by straining and hard stools.  He was prescribed MiraLAX to be taken multiple times a day, but he says he is currently taking it about once every 3 days.  He says this helps a little bit with making his stool softer.  Currently, his bowel habit is to go several days without a bowel movement, then have a large bowel movement which produces lots of pain and bleeding.  He has been treated previously with suppositories and topical nitroglycerin, and did have a brief period where he was not having symptoms. Other than the constipation, rectal bleeding and the pain with passage of stool, the patient denies chronic GI symptoms such as abdominal pain or diarrhea.  His weight has been stable.  He has no family history of inflammatory bowel disease or colon cancer.   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   Manic affective disorder with recurrent episode ([facility])    Seasonal allergies    sore throat 07/29/2013     Past Surgical History:  Procedure Laterality Date   FOOT FOREIGN BODY REMOVAL Right 01/19/2011   KNEE ARTHROSCOPY Right  08/01/2013   Procedure: ARTHROSCOPY RIGHT KNEE, Chondroplasty, Excision of Plica;  Surgeon: Harvie Junior, MD;  Location: [facility];  Service: Orthopedics;  Laterality: Right;   Family History  Problem Relation Age of Onset   Depression Mother    Colon polyps Mother    Social History   Tobacco Use   Smoking status: Every Day   Smokeless tobacco: Former  Building services engineer Use: Every day  Substance Use Topics   Alcohol use: Yes    Comment: every 3 days   Drug use: No   Current Outpatient Medications  Medication Sig Dispense Refill   benztropine (COGENTIN) 0.5 MG tablet Take 0.5 mg by mouth 2 (two) times daily.     fluPHENAZine (PROLIXIN) 10 MG tablet Take 1 tablet (10 mg total) by mouth 2 (two) times daily. (Patient taking differently: Take 10 mg by mouth daily.) 60 tablet 0   hydrOXYzine (ATARAX/VISTARIL) 25 MG tablet Take 1 tablet (25 mg total) by mouth 3 (three) times daily as needed for anxiety. 14 tablet 0   lithium 300 MG tablet Take 300 mg by mouth daily.     lithium carbonate (LITHOBID) 300 MG CR tablet Take 1 tablet (300 mg total) by mouth at bedtime. 30 tablet 0   meloxicam (MOBIC) 7.5 MG tablet Take 1 tablet (7.5 mg total) by mouth daily. 30 tablet 5   Nitroglycerin 0.4 % OINT Place 1  inch rectally every 12 (twelve) hours as needed (for hemorrhoid. Use up to 3 weeks- apply with cotton tipped swab). 30 g 3   prazosin (MINIPRESS) 1 MG capsule Take 1 mg by mouth at bedtime.     QUEtiapine (SEROQUEL) 100 MG tablet Take 1 tablet (100 mg total) by mouth at bedtime. (Patient taking differently: Take 25 mg by mouth at bedtime.) 30 tablet 0   No current facility-administered medications for this visit.   Allergies  Allergen Reactions   Trazodone And Nefazodone     Other reaction(s): Other priapism   Amoxicillin    Amoxicillin-Pot Clavulanate Diarrhea    Diarrhea    Clavulanic Acid      Review of Systems: All systems reviewed and negative except where  noted in HPI.    No results found.  Physical Exam: BP 124/82   Pulse 94   Ht 5\' 9"  (1.753 m)   Wt 182 lb 2 oz (82.6 kg)   SpO2 98%   BMI 26.90 kg/m  Constitutional: Pleasant,well-developed, Caucasian male in no acute distress. HEENT: Normocephalic and atraumatic. Conjunctivae are normal. No scleral icterus. Cardiovascular: Normal rate, regular rhythm.  Pulmonary/chest: Effort normal and breath sounds normal. No wheezing, rales or rhonchi. Abdominal: Soft, nondistended, nontender. Bowel sounds active throughout. There are no masses palpable. No hepatomegaly. Extremities: no edema Rectal: Linear skin tear along posterior midline, not abutting the anus.  No skin tag or hemorrhoid.  No anal fissure visualized. Neurological: Alert and oriented to person place and time. Skin: Skin is warm and dry. No rashes noted. Psychiatric: Normal mood and affect. Behavior is normal.  CBC    Component Value Date/Time   WBC 10.3 02/25/2021 1601   WBC 6.7 12/08/2020 1940   RBC 5.37 02/25/2021 1601   RBC 4.84 12/08/2020 1940   HGB 16.2 02/25/2021 1601   HCT 46.3 02/25/2021 1601   PLT 284 02/25/2021 1601   MCV 86 02/25/2021 1601   MCH 30.2 02/25/2021 1601   MCH 30.4 12/08/2020 1940   MCHC 35.0 02/25/2021 1601   MCHC 34.1 12/08/2020 1940   RDW 11.9 02/25/2021 1601   LYMPHSABS 1.9 02/25/2021 1601   MONOABS 0.5 12/08/2020 1940   EOSABS 0.1 02/25/2021 1601   BASOSABS 0.1 02/25/2021 1601    CMP     Component Value Date/Time   NA 138 02/25/2021 1601   K 4.0 02/25/2021 1601   CL 101 02/25/2021 1601   CO2 24 02/25/2021 1601   GLUCOSE 94 02/25/2021 1601   GLUCOSE 86 12/08/2020 1940   BUN 15 02/25/2021 1601   CREATININE 0.96 02/25/2021 1601   CALCIUM 9.5 02/25/2021 1601   PROT 7.6 02/25/2021 1601   ALBUMIN 5.3 (H) 02/25/2021 1601   AST 13 02/25/2021 1601   ALT 12 02/25/2021 1601   ALKPHOS 71 02/25/2021 1601   BILITOT 0.4 02/25/2021 1601   GFRNONAA >60 12/08/2020 1940   GFRAA >60  02/12/2015 0245     ASSESSMENT AND PLAN: 30 year old male with 6 months of hematochezia and painful defecation.  On exam, he has a linear skin tear along the posterior midline gluteal cleft, without communication with the anus.  I suspect this is related to more a perianal dermatitis rather than a true anal fissure.  This should not be causing hematochezia.  There is no anal fissure on exam that I could appreciate.  Because of the lack of a clear anal fissure and his hematochezia, I think a colonoscopy is warranted to further evaluate source of hematochezia.  It may be that this is from internal hemorrhoids.  With regards to the skin tear, I recommended he try an OTC zinc oxide barrier cream and to avoid excessive mechanical irritation to the area and keep the area dry after showers.  For his constipation, I recommended he take the MiraLAX every single day or even twice a day to achieve a soft bowel movement daily or at least every other day.  Hematochezia, painful defecation - No anal fissure seen on exam, suspect internal hemorrhoids as possible cause of hematochezia - Perianal pain and discomfort appears to be related to a dermatitis with skin tear, recommend treatment with barrier cream for now.  May consider treatment with topical steroid or antifungal if not improving.  Constipation - Daily MiraLAX, increase to twice a day if needed to produce a soft bowel movement every 1 to 2 days  The details, risks (including bleeding, perforation, infection, missed lesions, medication reactions and possible hospitalization or surgery if complications occur), benefits, and alternatives to colonoscopy with possible biopsy and possible polypectomy were discussed with the patient and he consents to proceed.   Clairissa Valvano E. Tomasa Rand, MD [facility]      CC: Early, Sung Amabile, NP

## 2021-04-11 ENCOUNTER — Encounter: Payer: 59 | Admitting: Gastroenterology

## 2021-04-14 ENCOUNTER — Other Ambulatory Visit: Payer: Self-pay

## 2021-04-14 ENCOUNTER — Encounter: Payer: Self-pay | Admitting: Gastroenterology

## 2021-04-14 ENCOUNTER — Ambulatory Visit (AMBULATORY_SURGERY_CENTER): Payer: 59 | Admitting: Gastroenterology

## 2021-04-14 VITALS — BP 88/62 | HR 68 | Temp 99.1°F | Resp 16 | Ht 69.0 in | Wt 182.0 lb

## 2021-04-14 DIAGNOSIS — K921 Melena: Secondary | ICD-10-CM | POA: Diagnosis not present

## 2021-04-14 DIAGNOSIS — K640 First degree hemorrhoids: Secondary | ICD-10-CM

## 2021-04-14 DIAGNOSIS — K5900 Constipation, unspecified: Secondary | ICD-10-CM

## 2021-04-14 MED ORDER — SODIUM CHLORIDE 0.9 % IV SOLN: 500.0000 mL | Freq: Once | INTRAVENOUS | Status: DC

## 2021-04-14 NOTE — Progress Notes (Signed)
A and O x3. Report to RN. Tolerated MAC anesthesia well. 

## 2021-04-14 NOTE — Progress Notes (Signed)
Pt's states no medical or surgical changes since previsit or office visit. VS assessed by D.T 

## 2021-04-14 NOTE — Progress Notes (Signed)
History and Physical Interval Note:  No interval changes in the patient's symptoms since his clinic on Sept 12th.  04/14/2021 8:52 AM  Clinton Fisher  has presented today for endoscopic procedure(s), with the diagnosis of  Encounter Diagnoses  Name Primary?   Hematochezia Yes   Constipation, unspecified constipation type   .  The various methods of evaluation and treatment have been discussed with the patient and/or family. After consideration of risks, benefits and other options for treatment, the patient has consented to  the endoscopic procedure(s).   The patient's history has been reviewed, patient examined, no change in status, stable for endoscopic procedure(s).  I have reviewed the patient's chart and labs.  Questions were answered to the patient's satisfaction.     [name] E. Tomasa Rand, MD [facility]

## 2021-04-14 NOTE — Patient Instructions (Addendum)
Information on hemorrhoids given to you today.  Resume previous diet and medications.  Repeat colonoscopy at age 30 for surveillance purposes.   YOU HAD AN ENDOSCOPIC PROCEDURE TODAY AT THE [facility]:   Refer to the procedure report that was given to you for any specific questions about what was found during the examination.  If the procedure report does not answer your questions, please call your gastroenterologist to clarify.  If you requested that your care partner not be given the details of your procedure findings, then the procedure report has been included in a sealed envelope for you to review at your convenience later.  YOU SHOULD EXPECT: Some feelings of bloating in the abdomen. Passage of more gas than usual.  Walking can help get rid of the air that was put into your GI tract during the procedure and reduce the bloating. If you had a lower endoscopy (such as a colonoscopy or flexible sigmoidoscopy) you may notice spotting of blood in your stool or on the toilet paper. If you underwent a bowel prep for your procedure, you may not have a normal bowel movement for a few days.  Please Note:  You might notice some irritation and congestion in your nose or some drainage.  This is from the oxygen used during your procedure.  There is no need for concern and it should clear up in a day or so.  SYMPTOMS TO REPORT IMMEDIATELY:  Following lower endoscopy (colonoscopy or flexible sigmoidoscopy):  Excessive amounts of blood in the stool  Significant tenderness or worsening of abdominal pains  Swelling of the abdomen that is new, acute  Fever of 100F or higher   For urgent or emergent issues, a gastroenterologist can be reached at any hour by calling [phone]. Do not use MyChart messaging for urgent concerns.    DIET:  We do recommend a small meal at first, but then you may proceed to your regular diet.  Drink plenty of fluids but you should avoid alcoholic beverages for  24 hours.  ACTIVITY:  You should plan to take it easy for the rest of today and you should NOT DRIVE or use heavy machinery until tomorrow (because of the sedation medicines used during the test).    FOLLOW UP: Our staff will call the number listed on your records 48-72 hours following your procedure to check on you and address any questions or concerns that you may have regarding the information given to you following your procedure. If we do not reach you, we will leave a message.  We will attempt to reach you two times.  During this call, we will ask if you have developed any symptoms of COVID 19. If you develop any symptoms (ie: fever, flu-like symptoms, shortness of breath, cough etc.) before then, please call [phone].  If you test positive for Covid 19 in the 2 weeks post procedure, please call and report this information to [place].    If any biopsies were taken you will be contacted by phone or by letter within the next 1-3 weeks.  Please call us at [phone] if you have not heard about the biopsies in 3 weeks.    SIGNATURES/CONFIDENTIALITY: You and/or your care partner have signed paperwork which will be entered into your electronic medical record.  These signatures attest to the fact that that the information above on your After Visit Summary has been reviewed and is understood.  Full responsibility of the confidentiality of this discharge information lies  with you and/or your care-partner.  

## 2021-04-14 NOTE — Op Note (Signed)
[facility] Patient Name: Clinton Fisher Procedure Date: 04/14/2021 8:42 AM MRN: [id number] Endoscopist: Lorin Picket E. Tomasa Rand , MD Age: 30 Referring MD:  Date of Birth: Jun 24, 1991 Gender: Male Account #: [id number] Procedure:                Colonoscopy Indications:              Hematochezia Medicines:                Monitored Anesthesia Care Procedure:                Pre-Anesthesia Assessment:                           - Prior to the procedure, a History and Physical                            was performed, and patient medications and                            allergies were reviewed. The patient's tolerance of                            previous anesthesia was also reviewed. The risks                            and benefits of the procedure and the sedation                            options and risks were discussed with the patient.                            All questions were answered, and informed consent                            was obtained. Prior Anticoagulants: The patient has                            taken no previous anticoagulant or antiplatelet                            agents. ASA Grade Assessment: II - A patient with                            mild systemic disease. After reviewing the risks                            and benefits, the patient was deemed in                            satisfactory condition to undergo the procedure.                           After obtaining informed consent, the colonoscope  was passed under direct vision. Throughout the                            procedure, the patient's blood pressure, pulse, and                            oxygen saturations were monitored continuously. The                            Colonoscope was introduced through the anus and                            advanced to the the terminal ileum, with                            identification of the appendiceal orifice and IC                             valve. The colonoscopy was performed without                            difficulty. The patient tolerated the procedure                            well. The quality of the bowel preparation was                            good. The terminal ileum, ileocecal valve,                            appendiceal orifice, and rectum were photographed. Scope In: 9:02:56 AM Scope Out: 9:15:29 AM Scope Withdrawal Time: 0 hours 8 minutes 9 seconds  Total Procedure Duration: 0 hours 12 minutes 33 seconds  Findings:                 The perianal exam findings include a skin                            irritation and a small linear skin tear along the                            gluteal cleft.                           The digital rectal exam was normal. Pertinent                            negatives include normal sphincter tone and no                            palpable rectal lesions.                           The colon (entire examined portion) appeared normal.  The terminal ileum appeared normal.                           Non-bleeding internal hemorrhoids were found during                            retroflexion. The hemorrhoids were Grade I                            (internal hemorrhoids that do not prolapse).                           No additional abnormalities were found on                            retroflexion. Complications:            No immediate complications. Estimated Blood Loss:     Estimated blood loss was minimal. Impression:               - Perianal skin irritation and skin tear found on                            perianal exam. This appeared improved compared to                            previous examination.                           - The entire examined colon is normal.                           - The examined portion of the ileum was normal.                           - Non-bleeding internal hemorrhoids. This is the                             source of the patient's hematochezia                           - No specimens collected. Recommendation:           - Patient has a contact number available for                            emergencies. The signs and symptoms of potential                            delayed complications were discussed with the                            patient. Return to normal activities tomorrow.                            Written discharge instructions were provided to the  patient.                           - Resume previous diet.                           - Continue present medications.                           - Repeat colonoscopy at age 50 for screening                            purposes.                           - Recommend daily fiber supplementation to reduce                            hemorrhoidal bleeding                           - If fiber supplementation not effective,                            hemorrhoid banding can be performed. Leyton Magoon E. Tomasa Rand, MD 04/14/2021 9:25:20 AM This report has been signed electronically.

## 2021-04-18 ENCOUNTER — Telehealth: Payer: Self-pay | Admitting: *Deleted

## 2021-04-18 NOTE — Telephone Encounter (Signed)
  Follow up Call-  Call back number 04/14/2021  Post procedure Call Back phone  # [phone]  Permission to leave phone message Yes  Some recent data might be hidden     Patient questions:  Do you have a fever, pain , or abdominal swelling? No. Pain Score  0 *  Have you tolerated food without any problems? Yes.    Have you been able to return to your normal activities? Yes.    Do you have any questions about your discharge instructions: Diet   No. Medications  No. Follow up visit  No.  Do you have questions or concerns about your Care? No.  Actions: * If pain score is 4 or above: No action needed, pain <4.  Have you developed a fever since your procedure? no  2.   Have you had an respiratory symptoms (SOB or cough) since your procedure? no  3.   Have you tested positive for COVID 19 since your procedure no  4.   Have you had any family members/close contacts diagnosed with the COVID 19 since your procedure?  no   If yes to any of these questions please route to Laverna Peace, RN and Karlton Lemon, RN

## 2021-08-19 ENCOUNTER — Encounter (HOSPITAL_COMMUNITY): Payer: Self-pay | Admitting: Psychiatry

## 2021-08-19 ENCOUNTER — Telehealth (HOSPITAL_BASED_OUTPATIENT_CLINIC_OR_DEPARTMENT_OTHER): Payer: 59 | Admitting: Psychiatry

## 2021-08-19 ENCOUNTER — Other Ambulatory Visit (HOSPITAL_COMMUNITY): Payer: Self-pay

## 2021-08-19 ENCOUNTER — Other Ambulatory Visit: Payer: Self-pay

## 2021-08-19 VITALS — Wt 180.0 lb

## 2021-08-19 DIAGNOSIS — G2401 Drug induced subacute dyskinesia: Secondary | ICD-10-CM

## 2021-08-19 DIAGNOSIS — F411 Generalized anxiety disorder: Secondary | ICD-10-CM | POA: Diagnosis not present

## 2021-08-19 DIAGNOSIS — Z79899 Other long term (current) drug therapy: Secondary | ICD-10-CM

## 2021-08-19 DIAGNOSIS — F319 Bipolar disorder, unspecified: Secondary | ICD-10-CM

## 2021-08-19 DIAGNOSIS — F4312 Post-traumatic stress disorder, chronic: Secondary | ICD-10-CM

## 2021-08-19 MED ORDER — QUETIAPINE FUMARATE 25 MG PO TABS: 25.0000 mg | ORAL_TABLET | Freq: Every day | ORAL | 0 refills | Status: DC

## 2021-08-19 MED ORDER — FLUPHENAZINE HCL 10 MG PO TABS: 10.0000 mg | ORAL_TABLET | Freq: Every day | ORAL | 0 refills | Status: DC

## 2021-08-19 MED ORDER — PRAZOSIN HCL 1 MG PO CAPS: 1.0000 mg | ORAL_CAPSULE | Freq: Every day | ORAL | 0 refills | Status: DC

## 2021-08-19 MED ORDER — HYDROXYZINE HCL 25 MG PO TABS: 25.0000 mg | ORAL_TABLET | Freq: Every day | ORAL | 0 refills | Status: DC | PRN

## 2021-08-19 MED ORDER — LITHIUM CARBONATE ER 450 MG PO TBCR: 450.0000 mg | EXTENDED_RELEASE_TABLET | Freq: Every day | ORAL | 0 refills | Status: DC

## 2021-08-19 MED ORDER — AUSTEDO 6 MG PO TABS: ORAL_TABLET | ORAL | 0 refills | Status: DC
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
## 2021-08-22 ENCOUNTER — Other Ambulatory Visit (HOSPITAL_COMMUNITY): Payer: Self-pay | Admitting: *Deleted

## 2021-08-22 DIAGNOSIS — F319 Bipolar disorder, unspecified: Secondary | ICD-10-CM

## 2021-08-22 DIAGNOSIS — G2401 Drug induced subacute dyskinesia: Secondary | ICD-10-CM

## 2021-08-22 MED ORDER — AUSTEDO 6 MG PO TABS: ORAL_TABLET | ORAL | 0 refills | Status: DC

## 2021-08-25 LAB — LITHIUM LEVEL: Lithium Lvl: <0.3 mmol/L — ABNORMAL LOW (ref 0.6–1.2)

## 2021-08-25 LAB — HEMOGLOBIN A1C
Hgb A1c MFr Bld: 5.1 % of total Hgb (ref <5.7)
Mean Plasma Glucose: 100 mg/dL
eAG (mmol/L): 5.5 mmol/L

## 2021-09-01 ENCOUNTER — Ambulatory Visit (HOSPITAL_COMMUNITY): Payer: 59 | Admitting: Clinical

## 2021-09-01 ENCOUNTER — Other Ambulatory Visit: Payer: Self-pay

## 2021-09-05 ENCOUNTER — Encounter (HOSPITAL_COMMUNITY): Payer: Self-pay | Admitting: Psychiatry

## 2021-09-05 ENCOUNTER — Telehealth (HOSPITAL_BASED_OUTPATIENT_CLINIC_OR_DEPARTMENT_OTHER): Payer: 59 | Admitting: Psychiatry

## 2021-09-05 ENCOUNTER — Other Ambulatory Visit: Payer: Self-pay

## 2021-09-05 DIAGNOSIS — F319 Bipolar disorder, unspecified: Secondary | ICD-10-CM

## 2021-09-05 DIAGNOSIS — F411 Generalized anxiety disorder: Secondary | ICD-10-CM

## 2021-09-05 DIAGNOSIS — G2401 Drug induced subacute dyskinesia: Secondary | ICD-10-CM

## 2021-09-05 DIAGNOSIS — F4312 Post-traumatic stress disorder, chronic: Secondary | ICD-10-CM | POA: Diagnosis not present

## 2021-09-05 MED ORDER — AUSTEDO 6 MG PO TABS: ORAL_TABLET | ORAL | 1 refills | Status: DC

## 2021-09-05 MED ORDER — LITHIUM CARBONATE ER 450 MG PO TBCR: 450.0000 mg | EXTENDED_RELEASE_TABLET | Freq: Every day | ORAL | 1 refills | Status: DC

## 2021-09-05 MED ORDER — PRAZOSIN HCL 1 MG PO CAPS: 1.0000 mg | ORAL_CAPSULE | Freq: Every day | ORAL | 1 refills | Status: DC

## 2021-09-05 MED ORDER — QUETIAPINE FUMARATE 25 MG PO TABS: 25.0000 mg | ORAL_TABLET | Freq: Every day | ORAL | 1 refills | Status: DC

## 2021-09-05 MED ORDER — FLUPHENAZINE HCL 10 MG PO TABS: 10.0000 mg | ORAL_TABLET | Freq: Every day | ORAL | 1 refills | Status: DC

## 2021-09-05 NOTE — Progress Notes (Signed)
Virtual Visit via Telephone Note  I connected with Clinton Fisher on 09/05/21 at  1:00 PM EST by telephone and verified that I am speaking with the correct person using two identifiers.  Location: Patient: Home Provider: Home Office   I discussed the limitations, risks, security and privacy concerns of performing an evaluation and management service by telephone and the availability of in person appointments. I also discussed with the patient that there may be a patient responsible charge related to this service. The patient expressed understanding and agreed to proceed.   History of Present Illness: Patient is 31 year old Caucasian unemployed man who was seen first time as self-referred.  His insurance does not cover [facility] where he was getting medication.  He is taking all his medication.  We increased lithium as he was prescribed 300 mg twice a day but only taking 1 pill.  Though he had picked up the medicine but has not started 450.  His recent lithium level is subtherapeutic.  His hemoglobin A1c is normal.  He is sleeping good.  He denies any paranoia, hallucination or any anger.  He has chronic PTSD and occasionally has nightmares and flashback but denies any panic attack.  He still have paranoia and trust issues but able to manage.  He is sleeping good.  He remembered noncompliance with medication has caused relapse into his illness but now he realized and take the medicine on time.  His energy level is good.  He has tardive dyskinesia and he takes Auestdo from [facility].  All his other medication goes [facility].  He has no tremor or shakes or any EPS.  He is trying to go back to work force but like to be more stabilized and give more time to get ready.  We have referred him therapist name but he has not scheduled appointment yet.  His appetite is okay and his weight is stable.  Patient has 27 year old daughter but her mother did not let him to visit.  However patient's mother visit her  granddaughter.  Patient lives with his mother who is very supportive.  Past Psychiatric History: H/O bipolar, schizophrenia, anxiety, PTSD.  He recalled one inpatient at behavioral health in May 2022.  At that time he was very labile, bizarre, paranoid and noncompliant with medication.  History of seeing multiple providers at [facility].  As per electronic medical record he had tried Lamictal, Abilify, Depakote, Ritalin, BuSpar, Prozac, Risperdal, Geodon but remembers most of these medication did not work for him.  He denies any history of suicidal attempt.  Recent Results (from the past 2160 hour(s))  Lithium level     Status: Abnormal   Collection Time: 08/24/21 10:36 AM  Result Value Ref Range   Lithium Lvl <0.3 (L) 0.6 - 1.2 mmol/L  HgB A1c     Status: None   Collection Time: 08/24/21 10:36 AM  Result Value Ref Range   Hgb A1c MFr Bld 5.1 <5.7 % of total Hgb    Comment: For the purpose of screening for the presence of diabetes: . <5.7%       Consistent with the absence of diabetes 5.7-6.4%    Consistent with increased risk for diabetes             (prediabetes) > or =6.5%  Consistent with diabetes . This assay result is consistent with a decreased risk of diabetes. . Currently, no consensus exists regarding use of hemoglobin A1c for diagnosis of diabetes in children. . According to  American Diabetes Association (ADA) guidelines, hemoglobin A1c <7.0% represents optimal control in non-pregnant diabetic patients. Different metrics may apply to specific patient populations.  Standards of Medical Care in Diabetes(ADA). .    Mean Plasma Glucose 100 mg/dL   eAG (mmol/L) 5.5 mmol/L    Psychiatric Specialty Exam: Physical Exam  Review of Systems  Weight 180 lb (81.6 kg).There is no height or weight on file to calculate BMI.  General Appearance: NA  Eye Contact:  NA  Speech:  Slow  Volume:  Decreased  Mood:  Euthymic  Affect:  NA  Thought Process:  Descriptions of Associations:  Intact  Orientation:  Full (Time, Place, and Person)  Thought Content:  Rumination  Suicidal Thoughts:  No  Homicidal Thoughts:  No  Memory:  Immediate;   Fair Recent;   Fair Remote;   Fair  Judgement:  Intact  Insight:  Present  Psychomotor Activity:  NA  Concentration:  Concentration: Fair and Attention Span: Fair  Recall:  [employer] of Knowledge:  Fair  Language:  Fair  Akathisia:  Yes  Handed:  Right  AIMS (if indicated):     Assets:  Communication Skills Desire for Improvement Housing Social Support  ADL's:  Intact  Cognition:  WNL  Sleep:   ok      Assessment and Plan: Bipolar disorder type I.  Generalized anxiety disorder.  Chronic PTSD.  Tardive dyskinesia.  I reviewed blood work results.  His hemoglobin A1c is 5.1 and lithium subtherapeutic.  However he has not started lithium 450 which was increased on the last visit.  We discussed optimizing the dose of medication and blood work results.  Patient is very reluctant to change his medication.  We did discuss polypharmacy.  Reminded to see a therapist.  We will continue Austedo 6 mg twice a day which he gets from [facility].  All his other medication [facility].  We will continue Seroquel 25 mg at bedtime, prazosin 1 mg at bedtime, Prolixin 10 mg daily and lithium 450 mg at bedtime.  He takes hydroxyzine as needed.  Recommended to call us back if is any question or any concern.  Follow-up in 2 months.  Follow Up Instructions:    I discussed the assessment and treatment plan with the patient. The patient was provided an opportunity to ask questions and all were answered. The patient agreed with the plan and demonstrated an understanding of the instructions.   The patient was advised to call back or seek an in-person evaluation if the symptoms worsen or if the condition fails to improve as anticipated.  I provided 19 minutes of non-face-to-face time during this encounter.   Cleotis Nipper, MD

## 2021-09-14 ENCOUNTER — Telehealth (HOSPITAL_COMMUNITY): Payer: Self-pay

## 2021-09-14 NOTE — Telephone Encounter (Signed)
Received a fax from [facility] stating that patient's Austedo 6mg  tablet must be filled through . Writer called [facility] and they stated that the patient has to call the Patient Care Dept at [phone] and press Option 1 in order to get his medication filled.  WRITER CALLED AND NOTIFIED PATIENT

## 2021-11-10 ENCOUNTER — Telehealth (HOSPITAL_BASED_OUTPATIENT_CLINIC_OR_DEPARTMENT_OTHER): Payer: 59 | Admitting: Psychiatry

## 2021-11-10 ENCOUNTER — Encounter (HOSPITAL_COMMUNITY): Payer: Self-pay | Admitting: Psychiatry

## 2021-11-10 VITALS — Wt 180.0 lb

## 2021-11-10 DIAGNOSIS — F4312 Post-traumatic stress disorder, chronic: Secondary | ICD-10-CM | POA: Diagnosis not present

## 2021-11-10 DIAGNOSIS — G2401 Drug induced subacute dyskinesia: Secondary | ICD-10-CM | POA: Diagnosis not present

## 2021-11-10 DIAGNOSIS — F319 Bipolar disorder, unspecified: Secondary | ICD-10-CM

## 2021-11-10 DIAGNOSIS — F411 Generalized anxiety disorder: Secondary | ICD-10-CM | POA: Diagnosis not present

## 2021-11-10 MED ORDER — QUETIAPINE FUMARATE 25 MG PO TABS: 25.0000 mg | ORAL_TABLET | Freq: Every day | ORAL | 1 refills | Status: DC

## 2021-11-10 MED ORDER — PRAZOSIN HCL 1 MG PO CAPS: 1.0000 mg | ORAL_CAPSULE | Freq: Every day | ORAL | 1 refills | Status: DC

## 2021-11-10 MED ORDER — HYDROXYZINE HCL 25 MG PO TABS: 25.0000 mg | ORAL_TABLET | Freq: Every day | ORAL | 0 refills | Status: DC | PRN

## 2021-11-10 MED ORDER — LITHIUM CARBONATE ER 450 MG PO TBCR: 450.0000 mg | EXTENDED_RELEASE_TABLET | Freq: Every day | ORAL | 1 refills | Status: DC
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
## 2021-12-02 ENCOUNTER — Ambulatory Visit (INDEPENDENT_AMBULATORY_CARE_PROVIDER_SITE_OTHER): Payer: 59 | Admitting: Nurse Practitioner

## 2021-12-02 ENCOUNTER — Encounter (HOSPITAL_BASED_OUTPATIENT_CLINIC_OR_DEPARTMENT_OTHER): Payer: Self-pay | Admitting: Nurse Practitioner

## 2021-12-02 VITALS — BP 112/80 | HR 86 | Ht 69.0 in | Wt 187.0 lb

## 2021-12-02 DIAGNOSIS — L989 Disorder of the skin and subcutaneous tissue, unspecified: Secondary | ICD-10-CM | POA: Diagnosis not present

## 2021-12-02 DIAGNOSIS — F319 Bipolar disorder, unspecified: Secondary | ICD-10-CM | POA: Diagnosis not present

## 2021-12-02 MED ORDER — DOXYCYCLINE HYCLATE 100 MG PO TABS: 100.0000 mg | ORAL_TABLET | Freq: Two times a day (BID) | ORAL | 0 refills | Status: DC

## 2021-12-02 NOTE — Progress Notes (Signed)
?Tollie Eth, DNP, AGNP-c ?Primary Care & Sports Medicine ?[address]  [address] ?[place], [place] [address] ?[phone] ? ?Subjective:  ? ?Trigo Clinton Fisher is a 31 y.o. male presents to day for abscess vs bug bite ?Skin Problem-possible tick bites ?Onset: Noticed on monday ?Location: right lateral upper arm  ?Pruritis: yes ?Swelling: yes ?Warmth: yes ?Drainage: clear ?Associated symptoms: felt like he was sick the first two days after noticing.  Has felt fine since that time ?Treatments tried: nothing ?Pulled tick off over the weekend.  ?No rashes, swollen glands, fevers, chills, nausea, vomiting, diarrhea present ? ?PMH, Medications, and Allergies reviewed and updated in chart.  ? ?ROS negative except for what is listed in HPI. ?Objective:  ?BP 112/80   Pulse 86   Ht 5\' 9"  (1.753 m)   Wt 187 lb (84.8 kg)   SpO2 97%   BMI 27.62 kg/m?  ?Physical Exam ?Vitals and nursing note reviewed.  ?Constitutional:   ?   Appearance: Normal appearance.  ?HENT:  ?   Head: Normocephalic.  ?Eyes:  ?   Extraocular Movements: Extraocular movements intact.  ?   Conjunctiva/sclera: Conjunctivae normal.  ?   Pupils: Pupils are equal, round, and reactive to light.  ?Cardiovascular:  ?   Rate and Rhythm: Normal rate and regular rhythm.  ?   Pulses: Normal pulses.  ?   Heart sounds: Normal heart sounds.  ?Pulmonary:  ?   Effort: Pulmonary effort is normal.  ?   Breath sounds: Normal breath sounds.  ?Abdominal:  ?   General: Bowel sounds are normal. There is no distension.  ?   Palpations: Abdomen is soft.  ?   Tenderness: There is no abdominal tenderness. There is no guarding.  ?Musculoskeletal:  ?   Cervical back: Normal range of motion. No tenderness.  ?   Right lower leg: No edema.  ?   Left lower leg: No edema.  ?Lymphadenopathy:  ?   Cervical: No cervical adenopathy.  ?Skin: ?   General: Skin is warm and dry.  ?   Capillary Refill: Capillary refill takes less than 2 seconds.  ?   Findings: Lesion  present.  ? ?    ?Neurological:  ?   General: No focal deficit present.  ?   Mental Status: He is alert and oriented to person, place, and time.  ?   Sensory: No sensory deficit.  ?   Motor: No weakness.  ?   Coordination: Coordination normal.  ?   Gait: Gait normal.  ?Psychiatric:     ?   Mood and Affect: Mood normal.     ?   Behavior: Behavior normal.     ?   Thought Content: Thought content normal.     ?   Judgment: Judgment normal.  ? ? ? ?Assessment & Plan:  ? ?Problem List Items Addressed This Visit   ? ? Bipolar 1 disorder (HCC)  ?  Chronic.  Stable at this time.  No alarm symptoms present.  Patient still taking medication as prescribed.  Follow-up with psychiatry. ? ?  ?  ? Skin problem - Primary  ?  Erythematous pustule present on the right posterior upper arm.  Gentle pressure reveals small amount of thick white drainage followed by serosanguineous fluid.  At this time unclear if this is related to a tick bite or possible folliculitis.  Area cleansed and topical antibiotic ointment placed and sterile dressing placed. ?Additional area noted on the left  posterior upper extremity in similar location with similar presentation.  Patient does have scattered pustules on neck, face, upper back. ?Given presentation and recent removal of a tick we will go ahead and begin treatment with doxycycline which will be helpful in the treatment of tick borne or follicular infection.  Encourage patient to monitor for new or worsening symptoms and follow-up if these present. ? ?  ?  ? Relevant Medications  ? doxycycline (VIBRA-TABS) 100 MG tablet  ? ? ? ? ?History and Medication reviewed and updated this encounter.  ? ?Tollie Eth, DNP, AGNP-c ?12/07/2021  5:33 PM   ? ?

## 2021-12-02 NOTE — Patient Instructions (Signed)
It was a pleasure seeing you today. I hope your time spent with [place] was pleasant and helpful. Please let us know if there is anything we can do to improve the service you receive.  ? ?Today we discussed concerns with ?Suspected tick bite ?Keep the area clean ?Watch for worsening signs of infection or any pus ?I have sent doxycycline for 5 days- take this twice a day with food ?If this doesn't get better let me know ? ? ? ? ?Important Office Information ?Lab Results ?If labs were ordered, please note that you will see results through [place] as soon as they come available from [place].  ?It takes up to 5 business days for the results to be routed to me and for me to review them once all of the lab results have come through from [facility]. I will make recommendations based on your results and send these through [place] or someone from the office will call you to discuss. If your labs are abnormal, we may contact you to schedule a visit to discuss the results and make recommendations.  ?If you have not heard from [place] within 5 business days or you have waited longer than a week and your lab results have not come through on [place], please feel free to call the office or send a message through [place] to follow-up on these labs.  ? ?Referrals ?If referrals were placed today, the office where the referral was sent will contact you either by phone or through [place] to set up scheduling. Please note that it can take up to a week for the referral office to contact you. If you do not hear from them in a week, please contact the referral office directly to inquire about scheduling.  ? ?Condition Treated ?If your condition worsens or you begin to have new symptoms, please schedule a follow-up appointment for further evaluation. If you are not sure if an appointment is needed, you may call the office to leave a message for the nurse and someone will contact you with recommendations.  ?If you have an urgent or life threatening  emergency, please do not call the office, but seek emergency evaluation by calling 911 or going to the nearest emergency room for evaluation.  ? ?MyChart and Phone Calls ?Please do not use MyChart for urgent messages. It may take up to 3 business days for MyChart messages to be read by staff and if they are unable to handle the request, an additional 3 business days for them to be routed to me and for my response.  ?Messages sent to the provider through [place] do not come directly to the provider, please allow time for these messages to be routed and for me to respond.  ?We get a large volume of MyChart messages daily and these are responded to in the order received.  ? ?For urgent messages, please call the office at [phone] and speak with the front office staff or leave a message on the line of my assistant for guidance.  ?We are seeing patients from the hours of 8:00 am through 5:00 pm and calls directly to the nurse may not be answered immediately due to seeing patients, but your call will be returned as soon as possible.  ?Phone  messages received after 4:00 PM Monday through Thursday may not be returned until the following business day. Phone messages received after 11:00 AM on Friday may not be returned until Monday.  ? ?After Hours ?We share on call hours with  providers from other offices. If you have an urgent need after hours that cannot wait until the next business day, please contact the on call provider by calling the office number. A nurse will speak with you and contact the provider if needed for recommendations.  ?If you have an urgent or life threatening emergency after hours, please do not call the on call provider, but seek emergency evaluation by calling 911 or going to the nearest emergency room for evaluation.  ? ?Paperwork ?All paperwork requires a minimum of 5 days to complete and return to you or the designated personnel. Please keep this in mind when bringing in forms or sending  requests for paperwork completion to the office.  ?  ?

## 2021-12-07 DIAGNOSIS — L989 Disorder of the skin and subcutaneous tissue, unspecified: Secondary | ICD-10-CM | POA: Insufficient documentation

## 2021-12-07 HISTORY — DX: Disorder of the skin and subcutaneous tissue, unspecified: L98.9

## 2021-12-07 NOTE — Assessment & Plan Note (Signed)
Chronic.  Stable at this time.  No alarm symptoms present.  Patient still taking medication as prescribed.  Follow-up with psychiatry. ?

## 2021-12-07 NOTE — Assessment & Plan Note (Signed)
Erythematous pustule present on the right posterior upper arm.  Gentle pressure reveals small amount of thick white drainage followed by serosanguineous fluid.  At this time unclear if this is related to a tick bite or possible folliculitis.  Area cleansed and topical antibiotic ointment placed and sterile dressing placed. ?Additional area noted on the left posterior upper extremity in similar location with similar presentation.  Patient does have scattered pustules on neck, face, upper back. ?Given presentation and recent removal of a tick we will go ahead and begin treatment with doxycycline which will be helpful in the treatment of tick borne or follicular infection.  Encourage patient to monitor for new or worsening symptoms and follow-up if these present. ?

## 2022-01-10 ENCOUNTER — Encounter (HOSPITAL_COMMUNITY): Payer: Self-pay | Admitting: Psychiatry

## 2022-01-10 ENCOUNTER — Telehealth (HOSPITAL_BASED_OUTPATIENT_CLINIC_OR_DEPARTMENT_OTHER): Payer: 59 | Admitting: Psychiatry

## 2022-01-10 VITALS — Wt 187.0 lb

## 2022-01-10 DIAGNOSIS — F4312 Post-traumatic stress disorder, chronic: Secondary | ICD-10-CM | POA: Diagnosis not present

## 2022-01-10 DIAGNOSIS — F319 Bipolar disorder, unspecified: Secondary | ICD-10-CM | POA: Diagnosis not present

## 2022-01-10 DIAGNOSIS — F411 Generalized anxiety disorder: Secondary | ICD-10-CM

## 2022-01-10 MED ORDER — HYDROXYZINE HCL 25 MG PO TABS: 25.0000 mg | ORAL_TABLET | Freq: Two times a day (BID) | ORAL | 1 refills | Status: DC | PRN

## 2022-01-10 MED ORDER — LITHIUM CARBONATE ER 450 MG PO TBCR: 900.0000 mg | EXTENDED_RELEASE_TABLET | Freq: Every day | ORAL | 1 refills | Status: DC

## 2022-01-10 NOTE — Progress Notes (Signed)
Virtual Visit via Telephone Note  I connected with Clinton Fisher on 01/10/22 at  3:40 PM EDT by telephone and verified that I am speaking with the correct person using two identifiers.  Location: Patient: Home Provider: Home office   I discussed the limitations, risks, security and privacy concerns of performing an evaluation and management service by telephone and the availability of in person appointments. I also discussed with the patient that there may be a patient responsible charge related to this service. The patient expressed understanding and agreed to proceed.   History of Present Illness: Patient is evaluated by phone session.  He is taking only lithium and hydroxyzine as needed.  Sometimes he forgets to take the lithium in the morning but overall he feels things are well.  He has no spasm, tremors and his nightmares are gone.  He admitted sometime sleep not as good because he have racing thoughts and any distraction does not help his sleep.  He denies any mania or psychosis.  He reported his thinking is much clearer and he is very pleased with stopping the Prolixin which was causing muscle spasm and tremors and shakes.  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  There is a level is improved.  His appetite is okay.  He lives with his mother.  He had a good support from his other family members.  He denies any anger but admitted sometimes get frustrated and irritable when he talked to himself to calm him down.  He wants to keep the hydroxyzine and lithium.   Past Psychiatric History: H/O bipolar, schizophrenia, anxiety, PTSD.  He recalled one inpatient at behavioral health in May 2022.  At that time he was very labile, bizarre, paranoid and noncompliant with medication. Seen multiple providers at [facility].  As per electronic medical record he had tried Lamictal, Abilify, Depakote, Ritalin, BuSpar, Prozac, Risperdal, Geodon but remembers most of these medication did not work for him.  He denies any  history of suicidal attempt.  Psychiatric Specialty Exam: Physical Exam  Review of Systems  Weight 187 lb (84.8 kg).There is no height or weight on file to calculate BMI.  General Appearance: NA  Eye Contact:  NA  Speech:  Slow  Volume:  Normal  Mood:  Euthymic  Affect:  NA  Thought Process:  Goal Directed  Orientation:  Full (Time, Place, and Person)  Thought Content:  Rumination  Suicidal Thoughts:  No  Homicidal Thoughts:  No  Memory:  Immediate;   Fair Recent;   Fair Remote;   Fair  Judgement:  Fair  Insight:  Fair  Psychomotor Activity:  NA  Concentration:  Concentration: Fair and Attention Span: Fair  Recall:  [employer] of Knowledge:  Good  Language:  Good  Akathisia:  No  Handed:  Right  AIMS (if indicated):     Assets:  Communication Skills Desire for Improvement Housing Social Support  ADL's:  Intact  Cognition:  WNL  Sleep:   fair, racing thoughts      Assessment and Plan: Bipolar disorder type I.  Generalized anxiety disorder.  Chronic PTSD.  Patient is not taking Seroquel, Minipress, and taking lithium mostly at nighttime as sometimes forget to take in the morning.  He is taking hydroxyzine 25 mg as needed and sometimes up to 2-3 times a day.  I recommend he should try taking lithium full dose at bedtime to avoid missing the dose in the morning.  He agree to give a try.  He  is not seeing any therapist.  We will discontinue Seroquel, prazosin.  Continue lithium 450 mg to take 2 pills at bedtime and hydroxyzine 25 mg twice a day as needed.  Recommended to call us back if is any question or any concern.  Follow-up in 2 months.  Follow Up Instructions:    I discussed the assessment and treatment plan with the patient. The patient was provided an opportunity to ask questions and all were answered. The patient agreed with the plan and demonstrated an understanding of the instructions.   The patient was advised to call back or seek an in-person evaluation if  the symptoms worsen or if the condition fails to improve as anticipated.  Collaboration of Care: Primary Care Provider AEB notes are available in epic to review.  Patient/Guardian was advised Release of Information must be obtained prior to any record release in order to collaborate their care with an outside provider. Patient/Guardian was advised if they have not already done so to contact the registration department to sign all necessary forms in order for [place] to release information regarding their care.   Consent: Patient/Guardian gives verbal consent for treatment and assignment of benefits for services provided during this visit. Patient/Guardian expressed understanding and agreed to proceed.    I provided 20 minutes of non-face-to-face time during this encounter.   Cleotis Nipper, MD

## 2022-02-03 ENCOUNTER — Other Ambulatory Visit: Payer: Self-pay

## 2022-02-03 ENCOUNTER — Encounter (HOSPITAL_COMMUNITY): Payer: Self-pay | Admitting: *Deleted

## 2022-02-03 ENCOUNTER — Emergency Department (HOSPITAL_COMMUNITY)
Admission: EM | Admit: 2022-02-03 | Discharge: 2022-02-04 | Disposition: A | Discharge status: Other Acute Inpatient Hospital | Payer: 59 | Attending: Emergency Medicine | Admitting: Emergency Medicine

## 2022-02-03 DIAGNOSIS — Z20822 Contact with and (suspected) exposure to covid-19: Secondary | ICD-10-CM | POA: Diagnosis not present

## 2022-02-03 DIAGNOSIS — Z23 Encounter for immunization: Secondary | ICD-10-CM | POA: Diagnosis not present

## 2022-02-03 DIAGNOSIS — F319 Bipolar disorder, unspecified: Secondary | ICD-10-CM | POA: Insufficient documentation

## 2022-02-03 DIAGNOSIS — R45851 Suicidal ideations: Secondary | ICD-10-CM

## 2022-02-03 DIAGNOSIS — R69 Illness, unspecified: Secondary | ICD-10-CM | POA: Diagnosis not present

## 2022-02-03 DIAGNOSIS — Z046 Encounter for general psychiatric examination, requested by authority: Secondary | ICD-10-CM | POA: Diagnosis not present

## 2022-02-03 DIAGNOSIS — R45850 Homicidal ideations: Secondary | ICD-10-CM | POA: Diagnosis not present

## 2022-02-03 LAB — COMPREHENSIVE METABOLIC PANEL
ALT: 20 U/L (ref 0–44)
AST: 19 U/L (ref 15–41)
Albumin: 5 g/dL (ref 3.5–5.0)
Alkaline Phosphatase: 66 U/L (ref 38–126)
Anion gap: 10 (ref 5–15)
BUN: 12 mg/dL (ref 6–20)
CO2: 24 mmol/L (ref 22–32)
Calcium: 9 mg/dL (ref 8.9–10.3)
Chloride: 102 mmol/L (ref 98–111)
Creatinine, Ser: 1.01 mg/dL (ref 0.61–1.24)
GFR, Estimated: >60 mL/min (ref >60)
Glucose, Bld: 82 mg/dL (ref 70–99)
Potassium: 3.4 mmol/L — ABNORMAL LOW (ref 3.5–5.1)
Sodium: 136 mmol/L (ref 135–145)
Total Bilirubin: 1.4 mg/dL — ABNORMAL HIGH (ref 0.3–1.2)
Total Protein: 8.1 g/dL (ref 6.5–8.1)

## 2022-02-03 LAB — ACETAMINOPHEN LEVEL: Acetaminophen (Tylenol), Serum: <10 ug/mL — ABNORMAL LOW (ref 10–30)

## 2022-02-03 LAB — CBC
HCT: 47.1 % (ref 39.0–52.0)
Hemoglobin: 15.8 g/dL (ref 13.0–17.0)
MCH: 29.3 pg (ref 26.0–34.0)
MCHC: 33.5 g/dL (ref 30.0–36.0)
MCV: 87.4 fL (ref 80.0–100.0)
Platelets: 352 10*3/uL (ref 150–400)
RBC: 5.39 MIL/uL (ref 4.22–5.81)
RDW: 12 % (ref 11.5–15.5)
WBC: 9.8 10*3/uL (ref 4.0–10.5)
nRBC: 0 % (ref 0.0–0.2)

## 2022-02-03 LAB — RAPID URINE DRUG SCREEN, HOSP PERFORMED
Amphetamines: NOT DETECTED
Barbiturates: NOT DETECTED
Benzodiazepines: NOT DETECTED
Cocaine: NOT DETECTED
Opiates: NOT DETECTED
Tetrahydrocannabinol: POSITIVE — AB

## 2022-02-03 LAB — RESP PANEL BY RT-PCR (FLU A&B, COVID) ARPGX2
Influenza A by PCR: NEGATIVE
Influenza B by PCR: NEGATIVE
SARS Coronavirus 2 by RT PCR: NEGATIVE

## 2022-02-03 LAB — LITHIUM LEVEL: Lithium Lvl: 0.59 mmol/L — ABNORMAL LOW (ref 0.60–1.20)

## 2022-02-03 LAB — ETHANOL: Alcohol, Ethyl (B): <10 mg/dL (ref <10)

## 2022-02-03 LAB — SALICYLATE LEVEL: Salicylate Lvl: <7 mg/dL — ABNORMAL LOW (ref 7.0–30.0)

## 2022-02-03 MED ORDER — LITHIUM CARBONATE ER 450 MG PO TBCR
900.0000 mg | EXTENDED_RELEASE_TABLET | Freq: Every day | ORAL | Status: DC
  Administered 2022-02-03: 900 mg via ORAL
  Filled 2022-02-03: qty 2

## 2022-02-03 MED ORDER — BACITRACIN ZINC 500 UNIT/GM EX OINT
TOPICAL_OINTMENT | Freq: Once | CUTANEOUS | Status: AC
Start: 2022-02-03 — End: 2022-02-03
  Filled 2022-02-03: qty 0.9

## 2022-02-03 MED ORDER — TETANUS-DIPHTH-ACELL PERTUSSIS 5-2.5-18.5 LF-MCG/0.5 IM SUSY
0.5000 mL | PREFILLED_SYRINGE | Freq: Once | INTRAMUSCULAR | Status: AC
Start: 2022-02-03 — End: 2022-02-03
  Administered 2022-02-03: 0.5 mL via INTRAMUSCULAR
  Filled 2022-02-03: qty 0.5

## 2022-02-03 MED ORDER — HYDROXYZINE HCL 25 MG PO TABS
25.0000 mg | ORAL_TABLET | Freq: Two times a day (BID) | ORAL | Status: DC | PRN
  Administered 2022-02-03: 25 mg via ORAL
  Filled 2022-02-03: qty 1

## 2022-02-03 NOTE — Progress Notes (Signed)
Patient has been denied by [facility] due to no appropriate beds available. Patient meets BH inpatient criteria per Maxie Barb, NP. Patient has been faxed out to the following facilities:   [facility]  [address] [place] [address] [phone]  [facility]  [address] [place] [address] [phone]  [facility]  [address] [place] [address] [phone]  [facility]  [address], [place] [address] [phone]  [facility]  [address] [place] [address] [phone]  [facility]  [address] [place] [address] [phone]  [facility]  [address] [place], [place] [address] [phone]  [facility]  [address] [place], [place] [address] [phone]  [facility]  [address] [place] [address] [phone]  [facility]  [address], [place] [address] [phone]  [facility]  [address] [place] [address] [phone]   Damita Dunnings, MSW, LCSW-A  7:32 PM 02/03/2022
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
## 2022-02-03 NOTE — BH Assessment (Addendum)
Comprehensive Clinical Assessment (CCA) Note  02/03/2022 [name] [id number] DISPOSITION: Per Leevy-Johnson NP patient meets inpatient criteria as bed placement is investigated.   [place] ED from 02/03/2022 in [place] Video Visit from 08/19/2021 in [facility] Admission (Discharged) from 12/09/2020 in [place] [facility]  C-SSRS RISK CATEGORY High Risk No Risk Error: Q3, 4, or 5 should not be populated when Q2 is No     The patient demonstrates the following risk factors for suicide: Chronic risk factors for suicide include: psychiatric disorder of Bipolar D/O . Acute risk factors for suicide include: mania. Protective factors for this patient include: coping skills. Considering these factors, the overall suicide risk at this point appears to be high. Patient is not appropriate for outpatient follow up.   Patient is a 31 year old male that presents voluntary this date to [facility] after contacting [facility] to transport him to ED for ongoing S/I, H/I and "problems with life." Patient denies any AVH. Per chart review patient has a history significant for Bipolar Disorder and receives OP services from [place] MD who assists with medication management. Patient reports current medication compliance although states "when I take them I'm still not right." Patient denies any specific plan or intent in reference to S/I although when asked about H/I patient states "well now that's different" although will not elaborate on content of statement. Patient is observed to be manic and speaks with pressured speech being difficult to redirect. Patient states that he has been living with his mother for the last 86 years and has one child "she never lets me see, but momma sees her" referring to his ex-partner who he states "will not let him see his child." Again patients history is scattered as patient finds it difficult to stay on  track. Patient states he is currently unemployed and "can't work because of her" this Probation officer is uncertain who patient is referring too and patient will not clarify. Patient denies that he will harm himself and denies any previous attempts or gestures but states he has "thought about it every day for the last 10 years." Again when asked in reference to H/I states, "I've said too much." This writer attempted to contact patient's mother this date Vlad Mayberry [phone]) unsuccessfully. Patient denies having a OP therapist. Patient denies access to firearms. Patient denies any SA issues although per chart review  has a history of Cannabis use with UDS this being positive for that substance. Patient states he has "racing thoughts all the time and things just come into my head" when asked in reference to current symptoms. Patient keeps repeating he is "here to get an ACT Team so he can just talk."  Per notes patient was seen and assessed on 12/08/20 when he presented to [facility] with similar symptoms and met inpatient criteria at that time.    Patient is alert although when asked in reference to orientation states "it's summer." Patient's memory appears to be intact although thoughts disorganized. Patient's mood is angry/agitated with affect congruent. Patient does not appear to be responding to internal stimuli.    Chief Complaint:  Chief Complaint  Patient presents with   Medical Clearance   Visit Diagnosis: Bipolar 1 Disorder     CCA Screening, Triage and Referral (STR)  Patient Reported Information How did you hear about [place]? Self  What Is the Reason for Your Visit/Call Today? Pt reports with ongoing S/I and H/I states he has "thoughts  every day for the last 10 years" denies any plan or intent  How Long Has This Been Causing You Problems? > than 6 months  What Do You Feel Would Help You the Most Today? Treatment for Depression or other mood problem   Have You Recently Had Any Thoughts About  Hurting Yourself? Yes  Are You Planning to Commit Suicide/Harm Yourself At This time? No   Have you Recently Had Thoughts About [place]? Yes  Are You Planning to Harm Someone at This Time? No  Explanation: No data recorded  Have You Used Any Alcohol or Drugs in the Past 24 Hours? No  How Long Ago Did You Use Drugs or Alcohol? No data recorded What Did You Use and How Much? No data recorded  Do You Currently Have a Therapist/Psychiatrist? Yes  Name of Therapist/Psychiatrist: Arfeen MD   Have You Been Recently Discharged From Any Office Practice or Programs? No  Explanation of Discharge From Practice/Program: No data recorded    CCA Screening Triage Referral Assessment Type of Contact: Tele-Assessment  Telemedicine Service Delivery: Telemedicine service delivery: This service was provided via telemedicine using a 2-way, interactive audio and video technology  Is this Initial or Reassessment? Initial Assessment  Date Telepsych consult ordered in [facility]:  02/03/22  Time Telepsych consult ordered in [facility]:  No data recorded Location of Assessment: [facility]  Provider Location: [facility] Assessment Services   Collateral Involvement: None at this time   Does Patient Have a [place]? No data recorded Name and Contact of Legal Guardian: No data recorded If Minor and Not Living with Parent(s), Who has Custody? NA  Is CPS involved or ever been involved? Never  Is APS involved or ever been involved? Never   Patient Determined To Be At Risk for Harm To Self or Others Based on Review of Patient Reported Information or Presenting Complaint? No  Method: No data recorded Availability of Means: No data recorded Intent: No data recorded Notification Required: No data recorded Additional Information for Danger to Others Potential: No data recorded Additional Comments for Danger to Others Potential: No data recorded Are There Guns or Other Weapons in  Your Home? No data recorded Types of Guns/Weapons: No data recorded Are These Weapons Safely Secured?                            No data recorded Who Could Verify You Are Able To Have These Secured: No data recorded Do You Have any Outstanding Charges, Pending Court Dates, Parole/Probation? No data recorded Contacted To Inform of Risk of Harm To Self or Others: Other: Comment (NA)    Does Patient Present under Involuntary Commitment? No  IVC Papers Initial File Date: No data recorded  [place] of Residence: [place]   Patient Currently Receiving the Following Services: Medication Management   Determination of Need: Urgent (48 hours)   Options For Referral: -- (To be determined)     CCA Biopsychosocial Patient Reported Schizophrenia/Schizoaffective Diagnosis in Past: No   Strengths: Pt is willing to participate in treatment   Mental Health Symptoms Depression:   Difficulty Concentrating; Irritability; Change in energy/activity; Hopelessness   Duration of Depressive symptoms:  Duration of Depressive Symptoms: Greater than two weeks   Mania:   Increased Energy; Racing thoughts   Anxiety:    Worrying; Tension; Difficulty concentrating   Psychosis:   None   Duration of Psychotic symptoms:    Trauma:  None   Obsessions:   None   Compulsions:   None   Inattention:   Disorganized; Fails to pay attention/makes careless mistakes; Forgetful   Hyperactivity/Impulsivity:   Always on the go   Oppositional/Defiant Behaviors:   Angry; Argumentative; Easily annoyed   Emotional Irregularity:   Intense/inappropriate anger; Intense/unstable relationships; Mood lability; Potentially harmful impulsivity   Other Mood/Personality Symptoms:   None noted    Mental Status Exam Appearance and self-care  Stature:   Average   Weight:   Average weight   Clothing:   Casual   Grooming:   Normal   Cosmetic use:   None   Posture/gait:   Normal   Motor  activity:   Not Remarkable   Sensorium  Attention:   Distractible   Concentration:   Anxiety interferes; Focuses on irrelevancies; Scattered   Orientation:   X5   Recall/memory:   Normal   Affect and Mood  Affect:   Anxious; Full Range; Tearful   Mood:   Anxious; Depressed   Relating  Eye contact:   Normal   Facial expression:   Responsive   Attitude toward examiner:   Cooperative   Thought and Language  Speech flow:  Flight of Ideas   Thought content:   Appropriate to Mood and Circumstances   Preoccupation:   None   Hallucinations:   None   Organization:  No data recorded  [employer] of Knowledge:   Average   Intelligence:   Average   Abstraction:   Functional   Judgement:   Impaired   Reality Testing:   Distorted   Insight:   Gaps   Decision Making:   Impulsive   Social Functioning  Social Maturity:   Impulsive; Irresponsible   Social Judgement:   Naive   Stress  Stressors:   Family conflict; Housing; Museum/gallery curator; Relationship; Work   Coping Ability:   Deficient supports; Exhausted; Overwhelmed   Skill Deficits:   Activities of daily living; Decision making; Interpersonal; Self-care; Self-control; Responsibility   Supports:   Family     Religion: Religion/Spirituality Are You A Religious Person?:  (Not assessed) How Might This Affect Treatment?: Not assessed  Leisure/Recreation: Leisure / Recreation Do You Have Hobbies?: No Leisure and Hobbies: NA  Exercise/Diet: Exercise/Diet Do You Exercise?:  (Not assessed) Have You Gained or Lost A Significant Amount of Weight in the Past Six Months?:  (Not assessed) Do You Follow a Special Diet?:  (Not assessed) Do You Have Any Trouble Sleeping?: No Explanation of Sleeping Difficulties: NA   CCA Employment/Education Employment/Work Situation: Employment / Work Situation Employment Situation: Unemployed Patient's Job has Been Impacted by Current  Illness: No Describe how Patient's Job has Been Impacted: NA Has Patient ever Been in the [employer]?: No  Education: Education Is Patient Currently Attending School?: No Last Grade Completed: 12 (Not assessed) Did You Attend College?: No (Not assessed) Did You Have An Individualized Education Program (IIEP): No (Not assessed) Did You Have Any Difficulty At School?: Yes Were Any Medications Ever Prescribed For These Difficulties?: No Patient's Education Has Been Impacted by Current Illness: No   CCA Family/Childhood History Family and Relationship History: Family history Does patient have children?: Yes How many children?: 1 How is patient's relationship with their children?: Pt states he "hasn't seen his child in years"  Childhood History:  Childhood History By whom was/is the patient raised?: Mother Did patient suffer any verbal/emotional/physical/sexual abuse as a child?: No Did patient suffer from severe childhood neglect?: No  Has patient ever been sexually abused/assaulted/raped as an adolescent or adult?: No Was the patient ever a victim of a crime or a disaster?: No Witnessed domestic violence?: No Has patient been affected by domestic violence as an adult?: No  Child/Adolescent Assessment:     CCA Substance Use Alcohol/Drug Use: Alcohol / Drug Use Pain Medications: See MAR Prescriptions: See MAR Over the Counter: See MAR History of alcohol / drug use?: Yes (Pt reports no hx of use although per UDS is positive for cannabis) Longest period of sobriety (when/how long): NA Negative Consequences of Use:  (NA) Withdrawal Symptoms:  (NA) Substance #1 Name of Substance 1: Cannis per chart review and UDS positive this date although patient states he "has never done any drugs." 1 - Age of First Use: UTA 1 - Amount (size/oz): UTA 1 - Frequency: UTA 1 - Duration: UTA 1 - Last Use / Amount: UTA 1 - Method of Aquiring: UTA 1- Route of Use: UTA                        ASAM's:  Six Dimensions of Multidimensional Assessment  Dimension 1:  Acute Intoxication and/or Withdrawal Potential:      Dimension 2:  Biomedical Conditions and Complications:      Dimension 3:  Emotional, Behavioral, or Cognitive Conditions and Complications:     Dimension 4:  Readiness to Change:     Dimension 5:  Relapse, Continued use, or Continued Problem Potential:     Dimension 6:  Recovery/Living Environment:     ASAM Severity Score:    ASAM Recommended Level of Treatment:     Substance use Disorder (SUD)    Recommendations for Services/Supports/Treatments:    Discharge Disposition:    DSM5 Diagnoses: Patient Active Problem List   Diagnosis Date Noted   Skin problem 12/07/2021   Fissure, anal 03/18/2021   Hemorrhoids 02/25/2021   High risk medication use 02/25/2021   Chronic pain of right knee 01/11/2021   Encounter for medical examination to establish care 01/11/2021   Bipolar 1 disorder ([place]) 10/18/2018   Tobacco use disorder 08/20/2017   GAD (generalized anxiety disorder) 09/25/2016     Referrals to Alternative Service(s): Referred to Alternative Service(s):   Place:   Date:   Time:    Referred to Alternative Service(s):   Place:   Date:   Time:    Referred to Alternative Service(s):   Place:   Date:   Time:    Referred to Alternative Service(s):   Place:   Date:   Time:     Mamie Nick, LCAS

## 2022-02-03 NOTE — ED Notes (Addendum)
Patient ambulated to restroom with a steady gait. Sitter present.

## 2022-02-03 NOTE — ED Provider Notes (Signed)
[facility] EMERGENCY DEPARTMENT Provider Note   CSN: [id number] Arrival date & time: 02/03/22  1401     History  Chief Complaint  Patient presents with   Medical Clearance    Clinton Fisher is a 31 y.o. male.  Clinton Fisher is a 31 y.o. male with a history of bipolar disorder, GERD, who presents to the emergency department reporting suicidal and homicidal ideation.  Patient reports he has been having these thoughts every day but has never acted on them.  He reports he has been having persistent and worsening negative thoughts.  He reports he struggled with his mental health over the last 10 years, was hospitalized 1 year ago due to psychosis.  Came in today seeking help.  He feels like he needs a different therapist and doctor and that his meds are not helping him anymore.  Thinks he needs an act team or more frequent check ins.  Reports that he currently lives at home with his mother and is struggling with the situation.  He reports that this has given him added stressors.  He reports he left home today to seek help with.  He denies any focal medical complaints aside from a small superficial abrasion to the left index finger.  Patient reports he accidentally scratched it on a rusty nail yesterday.  Unsure of last tetanus vaccination.  Denies any chest pain, shortness of breath, abdominal pain.  No fevers or recent illness.  The history is provided by the patient.       Home Medications Prior to Admission medications   Medication Sig Start Date End Date Taking? Authorizing Provider  acetaminophen (TYLENOL) 500 MG tablet Take 1,000 mg by mouth every 6 (six) hours as needed for moderate pain.   Yes [provider]  hydrocortisone cream 0.5 % Apply 1 Application topically 2 (two) times daily.   Yes [provider]  hydrOXYzine (ATARAX) 25 MG tablet Take 1 tablet (25 mg total) by mouth 2 (two) times daily as needed for anxiety. 01/10/22  Yes Arfeen, Phillips Grout, MD  lithium  carbonate (ESKALITH) 450 MG CR tablet Take 2 tablets (900 mg total) by mouth at bedtime. 01/10/22  Yes Arfeen, Phillips Grout, MD  omeprazole (PRILOSEC OTC) 20 MG tablet Take 20 mg by mouth daily as needed (Acid reflux).   Yes [provider]  doxycycline (VIBRA-TABS) 100 MG tablet Take 1 tablet (100 mg total) by mouth 2 (two) times daily. Patient not taking: Reported on 02/03/2022 12/02/21   Early, Sung Amabile, NP  fluPHENAZine (PROLIXIN) 10 MG tablet Take 1 tablet (10 mg total) by mouth daily. Patient not taking: Reported on 11/10/2021 09/05/21   Cleotis Nipper, MD      Allergies    Trazodone and nefazodone, Amoxicillin, Amoxicillin-pot clavulanate, and Clavulanic acid    Review of Systems   Review of Systems  Unable to perform ROS: Psychiatric disorder    Physical Exam Updated Vital Signs BP (!) 141/100 (BP Location: Right Arm)   Pulse (!) 101   Temp 98.2 F (36.8 C) (Oral)   Resp 20   Ht 5\' 9"  (1.753 m)   Wt 80.7 kg   SpO2 97%   BMI 26.29 kg/m  Physical Exam Vitals and nursing note reviewed.  Constitutional:      General: He is not in acute distress.    Appearance: Normal appearance. He is well-developed. He is not ill-appearing or diaphoretic.  HENT:     Head: Normocephalic and atraumatic.  Mouth/Throat:     Mouth: Mucous membranes are moist.     Pharynx: Oropharynx is clear.  Eyes:     General:        Right eye: No discharge.        Left eye: No discharge.     Extraocular Movements: Extraocular movements intact.     Pupils: Pupils are equal, round, and reactive to light.  Cardiovascular:     Rate and Rhythm: Normal rate and regular rhythm.     Heart sounds: Normal heart sounds.  Pulmonary:     Effort: Pulmonary effort is normal. No respiratory distress.     Breath sounds: Normal breath sounds.  Abdominal:     Palpations: Abdomen is soft.     Tenderness: There is no abdominal tenderness.  Musculoskeletal:     Comments: Superficial abrasion to the left index finger  with minimal surrounding erythema, no swelling or expressible drainage, full range of motion  Neurological:     Mental Status: He is alert and oriented to person, place, and time.     Coordination: Coordination normal.  Psychiatric:        Attention and Perception: Attention normal.        Mood and Affect: Mood normal. Affect is labile.        Speech: Speech is rapid and pressured.        Behavior: Behavior normal.        Thought Content: Thought content includes homicidal and suicidal ideation.     ED Results / Procedures / Treatments   Labs (all labs ordered are listed, but only abnormal results are displayed) Labs Reviewed  COMPREHENSIVE METABOLIC PANEL - Abnormal; Notable for the following components:      Result Value   Potassium 3.4 (*)    Total Bilirubin 1.4 (*)    All other components within normal limits  SALICYLATE LEVEL - Abnormal; Notable for the following components:   Salicylate Lvl <7.0 (*)    All other components within normal limits  ACETAMINOPHEN LEVEL - Abnormal; Notable for the following components:   Acetaminophen (Tylenol), Serum <10 (*)    All other components within normal limits  RAPID URINE DRUG SCREEN, HOSP PERFORMED - Abnormal; Notable for the following components:   Tetrahydrocannabinol POSITIVE (*)    All other components within normal limits  LITHIUM LEVEL - Abnormal; Notable for the following components:   Lithium Lvl 0.59 (*)    All other components within normal limits  RESP PANEL BY RT-PCR (FLU A&B, COVID) ARPGX2  ETHANOL  CBC    EKG None  Radiology No results found.  Procedures Procedures    Medications Ordered in ED Medications  hydrOXYzine (ATARAX) tablet 25 mg (25 mg Oral Given 02/03/22 2122)  lithium carbonate (ESKALITH) CR tablet 900 mg (900 mg Oral Given 02/03/22 2123)  Tdap (BOOSTRIX) injection 0.5 mL (0.5 mLs Intramuscular Given 02/03/22 1711)  bacitracin ointment ( Topical Given 02/03/22 1711)    ED Course/ Medical  Decision Making/ A&P                           Medical Decision Making Amount and/or Complexity of Data Reviewed Labs: ordered.  Risk OTC drugs. Prescription drug management.   31 y.o. male presents to the ED with complaints of homicidal and suicidal ideations., this involves an extensive number of treatment options, and is a complaint that carries with it a high risk of complications and morbidity.  On arrival pt is nontoxic, vitals stable.   Additional history obtained from chart review. Previous records obtained and reviewed    Lab Tests:  I Ordered, reviewed, and interpreted labs, which included: No leukocytosis, normal hemoglobin, no significant electrolyte derangements, normal renal and liver function aside from mildly elevated bilirubin.  Negative tox labs.  Lithium level of 0.59.  UDS positive for THC but otherwise negative.  COVID-negative   ED Course:   Tetanus updated and wound care and dressing applied to the small superficial abrasion to the finger. Medical complaints.  Patient endorses suicidal and homicidal ideations without specific plan but also seems to have rapid and pressured speech and is quite perseverative.  Feel he would benefit from psychiatric consultation.  At this time patient is medically cleared for TTS consult.  TTS consulted and they recommend inpatient placement, no beds available at [facility], patient sent out for placement at outside hospitals.  Patient has been accepted at [facility] with plans for transport tomorrow morning.  After speaking with the patient he is in agreement to go to [facility], patient is restarted on his lithium and hydroxyzine.  The patient has been placed in psychiatric observation due to the need to provide a safe environment for the patient while obtaining psychiatric consultation and evaluation, as well as ongoing medical and medication management to treat the  patient's condition.  The patient has been placed under full IVC at this time.   Portions of this note were generated with Scientist, clinical (histocompatibility and immunogenetics). Dictation errors may occur despite best attempts at proofreading.         Final Clinical Impression(s) / ED Diagnoses Final diagnoses:  Suicidal ideation  Homicidal ideation    Rx / DC Orders ED Discharge Orders     None         Legrand Rams 02/03/22 2358    Glendora Score, MD 02/04/22 1232

## 2022-02-03 NOTE — Progress Notes (Signed)
[facility] LCSW Progress Note   02/03/2022    7:58 PM  Clinton Fisher   [id number]   Type of Contact and Topic:  Psychiatric Bed Placement   Pt accepted to [facility]    Patient meets inpatient criteria per Maxie Barb, NP   The attending provider will be Landry Mellow, MD   Call report to [phone] or [phone]   Alene Mires, RN @ [facility] ED notified.     Pt scheduled  to arrive at [facility] AFTER 0800.    Damita Dunnings, MSW, LCSW-A  7:59 PM 02/03/2022

## 2022-02-03 NOTE — ED Notes (Addendum)
[facility] accepting patient, reports placement will be tomorrow. Accepting MD Landry Mellow.  Reporting number is [phone] or [phone].

## 2022-02-03 NOTE — ED Notes (Signed)
Home medications given to pharmacy at this time

## 2022-02-03 NOTE — ED Notes (Signed)
Patient given phone temporarily so he could call his mother to let her know to feed his cats.  This RN took phone back and secured it in patient's locker.  Patient's mother is Abhay Godbolt [phone].

## 2022-02-03 NOTE — ED Notes (Signed)
Patient had medications from home on self at arrival. Medications were obtained, counted and verified by 2 RN's, reconciled and put in appropriate storage container. Patient signed form verifying each medication and the medication count.  Pharmacy was called. Awaiting pharmacy to retrieve medications.

## 2022-02-04 DIAGNOSIS — K219 Gastro-esophageal reflux disease without esophagitis: Secondary | ICD-10-CM | POA: Diagnosis not present

## 2022-02-04 DIAGNOSIS — R45851 Suicidal ideations: Secondary | ICD-10-CM | POA: Diagnosis not present

## 2022-02-04 DIAGNOSIS — R45850 Homicidal ideations: Secondary | ICD-10-CM | POA: Diagnosis not present

## 2022-02-04 DIAGNOSIS — F3112 Bipolar disorder, current episode manic without psychotic features, moderate: Secondary | ICD-10-CM | POA: Diagnosis not present

## 2022-02-04 DIAGNOSIS — F310 Bipolar disorder, current episode hypomanic: Secondary | ICD-10-CM | POA: Diagnosis not present

## 2022-02-04 DIAGNOSIS — R69 Illness, unspecified: Secondary | ICD-10-CM | POA: Diagnosis not present

## 2022-02-04 DIAGNOSIS — Z91148 Patient's other noncompliance with medication regimen for other reason: Secondary | ICD-10-CM | POA: Diagnosis not present

## 2022-02-04 DIAGNOSIS — E310 Autoimmune polyglandular failure: Secondary | ICD-10-CM | POA: Diagnosis not present

## 2022-02-04 MED ORDER — LORAZEPAM 1 MG PO TABS
1.0000 mg | ORAL_TABLET | Freq: Once | ORAL | Status: DC
  Filled 2022-02-04: qty 1

## 2022-02-04 NOTE — ED Notes (Signed)
MD Bero at bedside  °

## 2022-02-04 NOTE — ED Notes (Signed)
Patient refusing ativan, stating "I'm not sleeping here, I want my phone so I can call the sheriff so he can let me go home to feed my cats."  Patient states "I'm not going to [place].  The doctor on the computer lied to me.  I only wanted to get my medications managed and ACT to come help me at home.  I don't have a plan to hurt myself or others.  I've been feeling suicidal for years.  If I really wanted to hurt myself, I would have by now."  MD Bero made aware.

## 2022-02-04 NOTE — ED Notes (Signed)
[employer] served IVC paperwork

## 2022-02-04 NOTE — ED Notes (Signed)
Patient's belongings and paperwork given to [employer].

## 2022-02-04 NOTE — ED Notes (Signed)
Patient requesting something to help him sleep.  MD Bero made aware.

## 2022-02-06 ENCOUNTER — Telehealth (HOSPITAL_COMMUNITY): Payer: Self-pay | Admitting: *Deleted

## 2022-02-06 NOTE — Telephone Encounter (Signed)
I returned patient's mother phone call.  She is upset because patient is not taking his medication and is started to having argument, yelling and accusing for his mental illness.  Mother reported that patient does not want to take the medication and trying to get better only with counseling and not happy with current living situation.  I reviewed also the notes from emergency room.  Patient does need a high level of care maybe ACT services.  Given the history of noncompliance with medication and not following treatment plan and the stopping the medication on its own may not benefit the patient and need a high level of care.  I told mother that she should talk to the current psychiatrist who is managing medication at [facility] about her concern and upon discharge may get outpatient commitment for medication and outpatient compliance.  Mother is reluctant to take him back and going to talk to the team at [facility] to consider group home placement. Patient had stopped his Prolixin injection and cut down his lithium on his own. Patient needs a higher level of care and will not be suitable for outpatient medication management services.

## 2022-02-06 NOTE — Telephone Encounter (Signed)
Pt's mother called to inform that pt has been admitted to [facility] under IVC. She gave her phone number [phone]) if more information is needed. There is a release in the chart for [place] to speak with mother, from 03/2021. I don't see anything from ED. Pt did leave a tangential, disorganized message on writer's VM on 02/02/22 however he left no identifying information other than "Clinton Fisher" which was difficult to understand. Pt is not known to this nurse so unable to identify or check chart. Pt primarily was stating "I love you Dr. Lolly Mustache" and repeating "I'm OK". FYI.

## 2022-03-13 ENCOUNTER — Telehealth (HOSPITAL_COMMUNITY): Payer: 59 | Admitting: Psychiatry

## 2022-03-14 ENCOUNTER — Other Ambulatory Visit (HOSPITAL_COMMUNITY): Payer: Self-pay | Admitting: Psychiatry

## 2022-03-14 DIAGNOSIS — F4312 Post-traumatic stress disorder, chronic: Secondary | ICD-10-CM

## 2022-03-14 DIAGNOSIS — F319 Bipolar disorder, unspecified: Secondary | ICD-10-CM

## 2022-03-14 DIAGNOSIS — F411 Generalized anxiety disorder: Secondary | ICD-10-CM

## 2022-04-15 IMAGING — CR DG FOOT 2V*L*
2 series · 2 of 2 positions shown · non-contrast
Comparison: None.

CLINICAL DATA: Generalized left foot pain

EXAM:
LEFT FOOT - 2 VIEW

[t foot ap left]
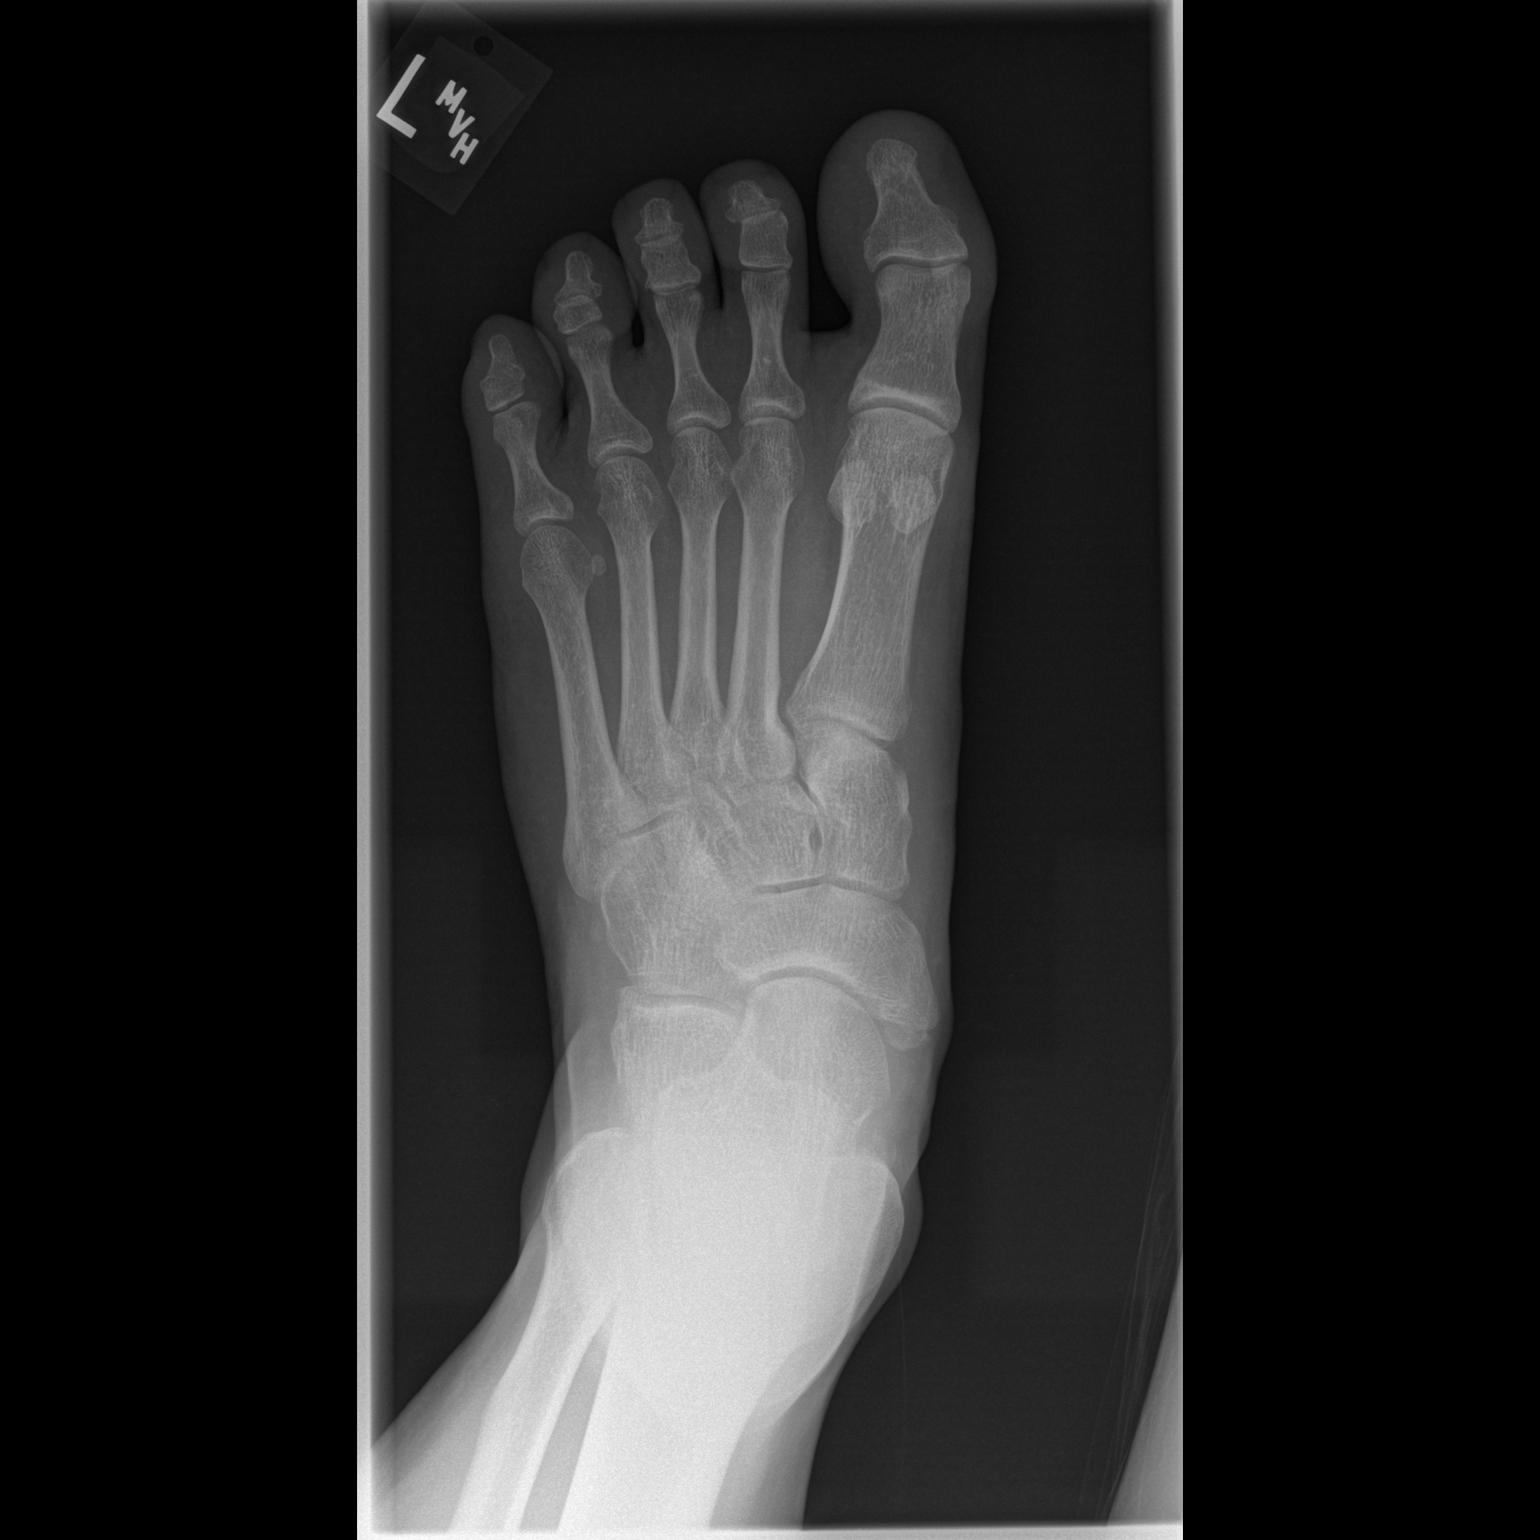

[t foot lat left]
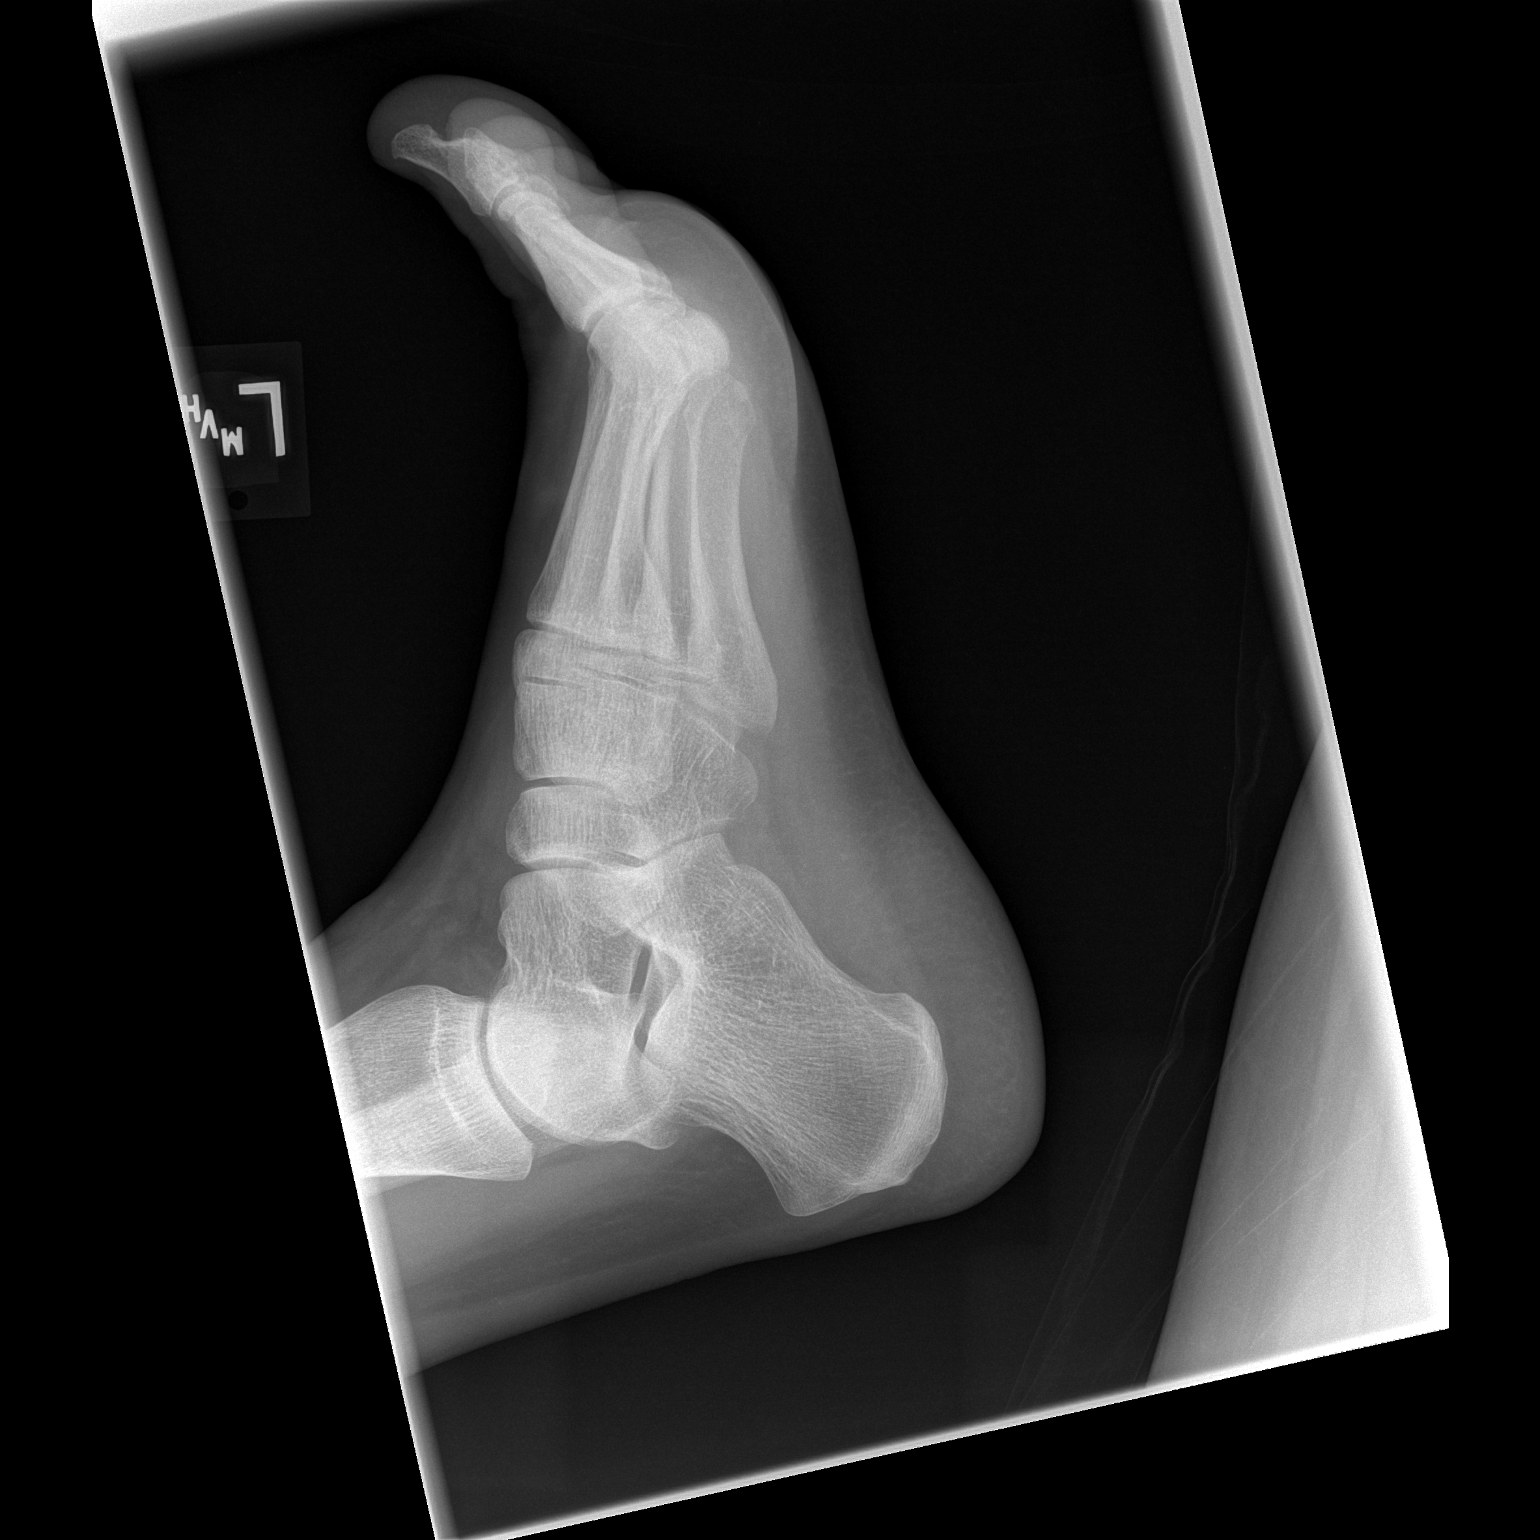

[2 of 2 positions shown; findings below may reference images not displayed]

FINDINGS: Mild first tarsometatarsal arthrosis slight hallux valgus
interphalangeus at the first interphalangeal joint with slight soft
tissue thickening/bunion formation at medial aspect of the joint.
Accessory navicular noted. No acute bony abnormality. Specifically,
no fracture, subluxation, or dislocation. No soft tissue gas or
foreign bodies.
IMPRESSION: No acute osseous abnormality.

Minimal first tarsometatarsal arthrosis.

Hallux valgus interphalangeus with bunion formation adjacent the
first interphalangeal joint.

Accessory navicular.

## 2022-05-14 IMAGING — DX DG KNEE COMPLETE 4+V*R*
4 series · 4 of 4 positions shown · non-contrast
Comparison: None.

CLINICAL DATA: Chronic right knee pain behind the patella, injury 7
years ago

EXAM:
RIGHT KNEE - COMPLETE 4+ VIEW

[knee ap]
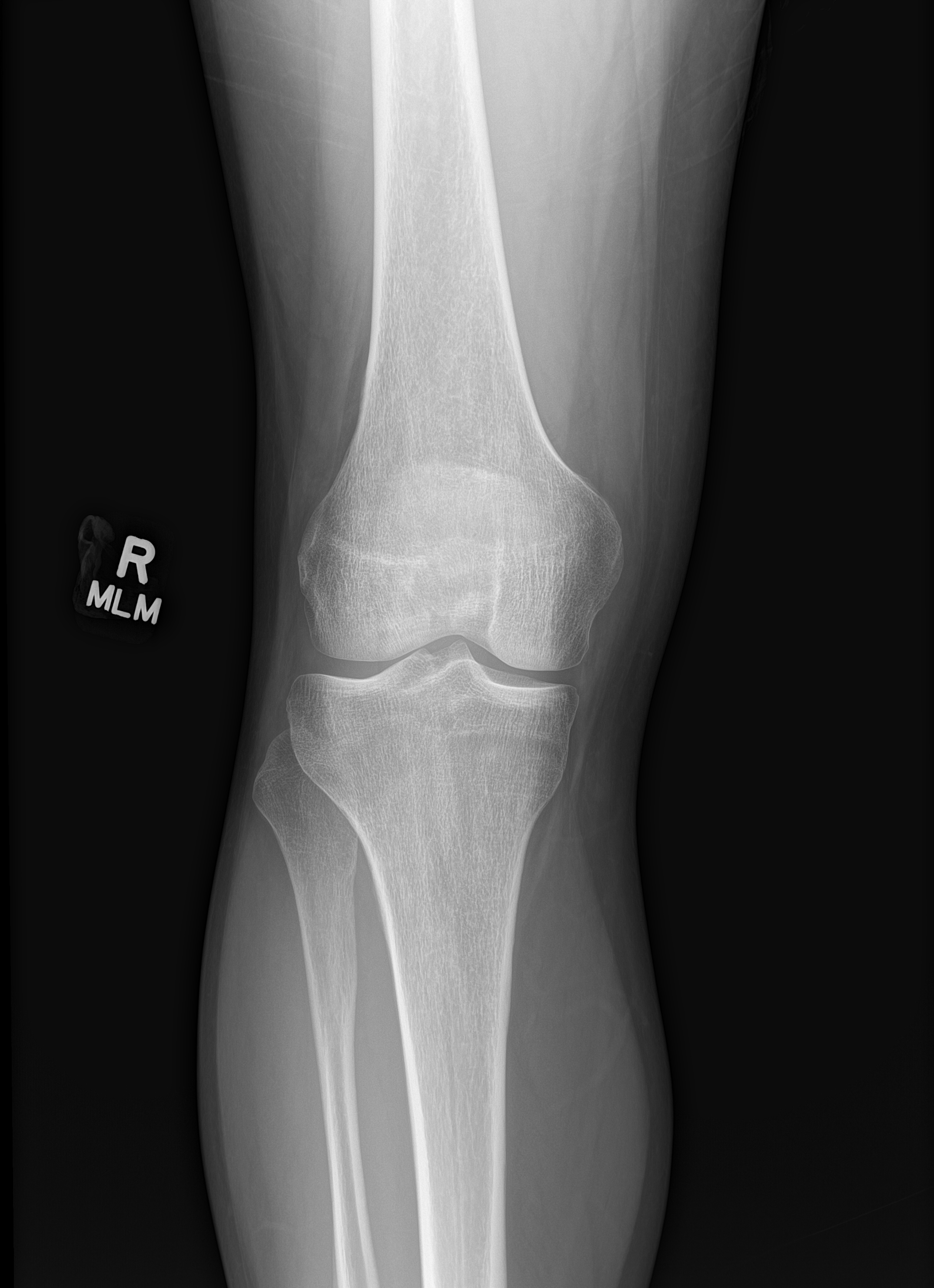

[knee obl (1 of 2)]
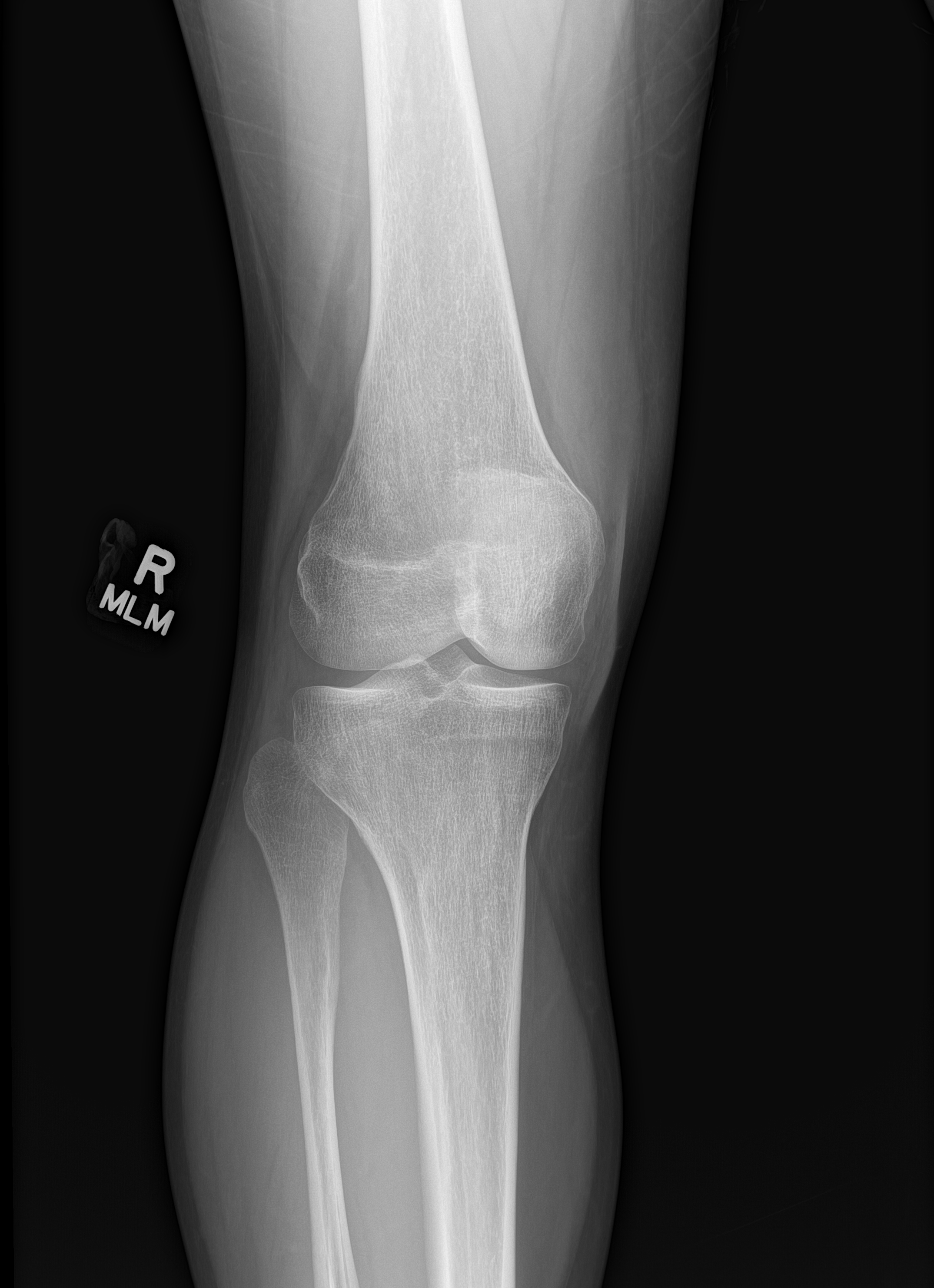

[knee obl (2 of 2)]
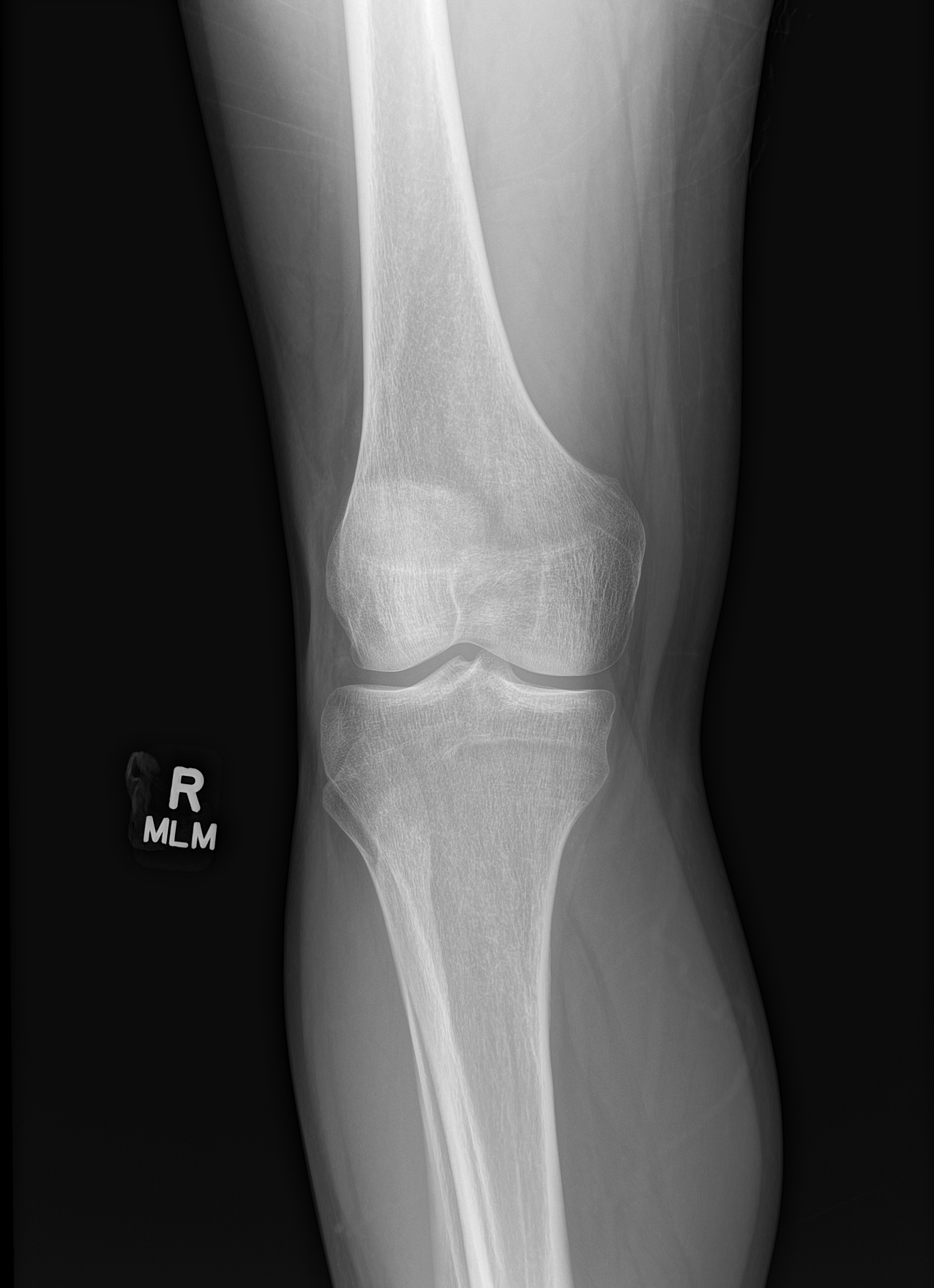

[knee lat]
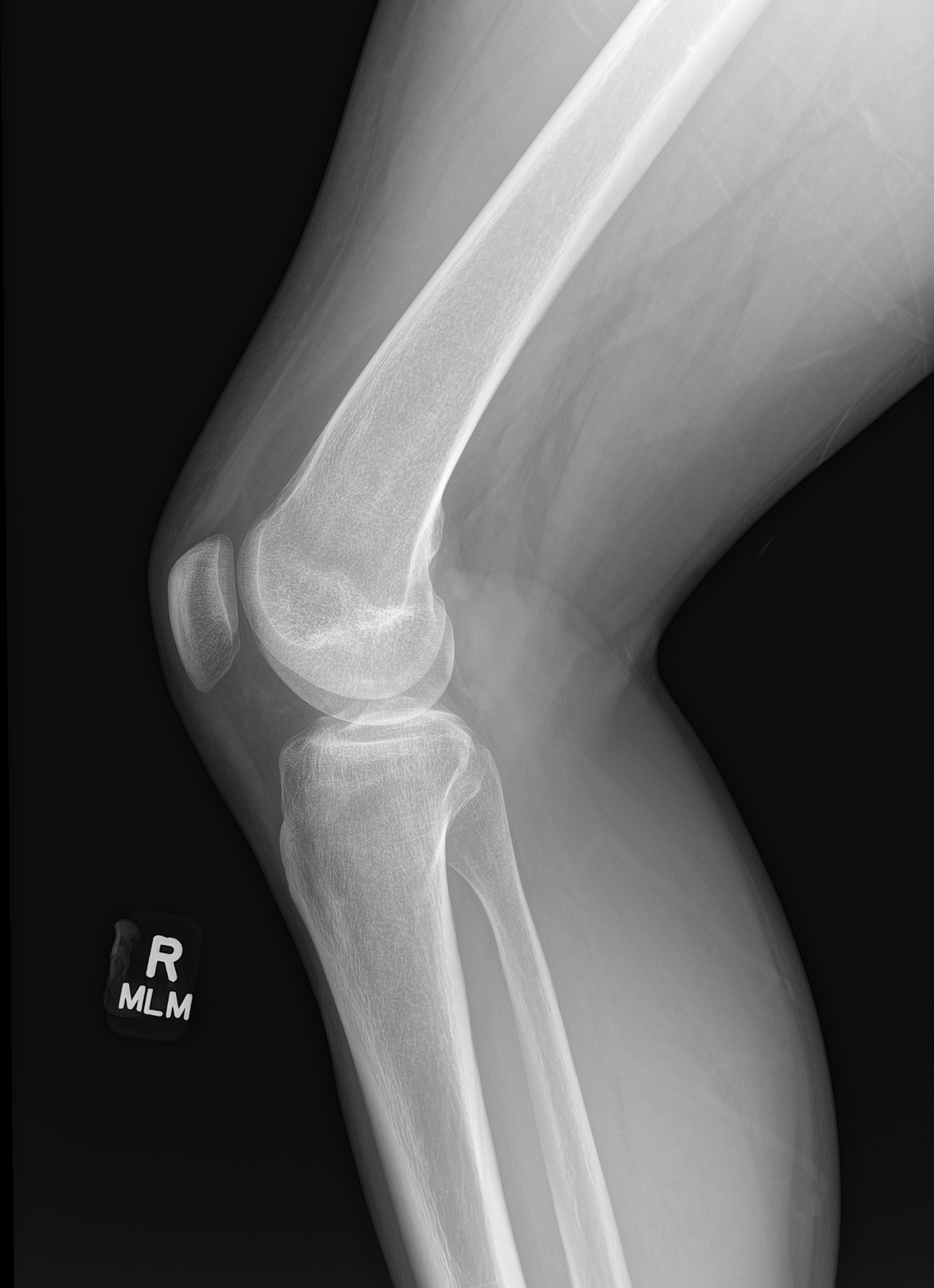

[4 of 4 positions shown; findings below may reference images not displayed]

FINDINGS: No evidence of fracture, dislocation, or joint effusion. No evidence
of arthropathy or other focal bone abnormality. Soft tissues are
unremarkable.
IMPRESSION: No fracture or dislocation of the right knee. Joint spaces are
preserved.

## 2022-05-29 ENCOUNTER — Encounter (HOSPITAL_BASED_OUTPATIENT_CLINIC_OR_DEPARTMENT_OTHER): Payer: Self-pay | Admitting: Nurse Practitioner

## 2022-05-29 ENCOUNTER — Ambulatory Visit (INDEPENDENT_AMBULATORY_CARE_PROVIDER_SITE_OTHER): Payer: 59

## 2022-05-29 ENCOUNTER — Ambulatory Visit (INDEPENDENT_AMBULATORY_CARE_PROVIDER_SITE_OTHER): Payer: 59 | Admitting: Nurse Practitioner

## 2022-05-29 VITALS — BP 141/99 | HR 96 | Ht 69.0 in | Wt 188.0 lb

## 2022-05-29 DIAGNOSIS — M778 Other enthesopathies, not elsewhere classified: Secondary | ICD-10-CM

## 2022-05-29 DIAGNOSIS — M545 Low back pain, unspecified: Secondary | ICD-10-CM | POA: Diagnosis not present

## 2022-05-29 MED ORDER — MELOXICAM 15 MG PO TABS: 15.0000 mg | ORAL_TABLET | Freq: Every day | ORAL | 1 refills | Status: DC

## 2022-05-29 NOTE — Patient Instructions (Addendum)
The symptoms in your hands are consistent with tendonitis. The best treatment for this is anti-inflammatory medications, using ice for 20 minutes 2-3 times a day on the affected area. I have included hand stretches for you to do for the next week. This may take longer to heal than that, but should feel better in about a week. If it is not feeling any better, I will send in a referral to the orthopedic specialist.   I would like to get an x-ray of your back to make sure that the bones are in alignment. If the bones are all in line then we will start exercises.      Tendinitis Information  ANATOMY The tendons in your hand connect the muscles to the bone. When they contract, this causes the normal movement of the fingers. The muscles that cause the finger and thumb to move are in the forearm, where long tendons stretch down.  The extensor tendons (at the top of the hand) straighten the fingers. The flexor tendons on the palm side of your hand bend the fingers.  When you straighten or bend your fingers, your tendons move through small tunnels known as tendon sheaths. These tendon sheaths allow the tendons to remain next to the bone.  DESCRIPTION Flexor tendonitis can occur when there is a strain on the tendons. This strain can cause unwanted pain and stress.  Damage to the tendon in the forearm, wrist, palm, or along the finger can stop all movement in the finger. If a tendon becomes torn, any tension on it will create a rubber band effect and cause it to weaken. This slows the healing process.  If a deep cut occurs, damage to nerves or blood vessels may occur. This is very serious and requires immediate surgery to remedy.  SYMPTOMS There are a few common signs of flexor tendonitis:  Inability to bend one or more finger joints. Numbness in the fingertip. Pain when a finger is bent. Tenderness along the finger on the palm side of the hand.  CAUSES Inflammation is flexor tendonitis's  primary underlying cause. Inflammation is a positive bodily reaction. It is the immune system's way of combating illness and healing injuries.  Occasionally, inflammation lasts for extended periods for various reasons such as underlying disease, poor dietary intake, potentially harmful lifestyle habits like excessive alcohol intake and cigarette smoking, and exposure to air or water-borne toxins.  Inflammation irritates affected cells and tissues. Over time, if its primary cause is not diagnosed and treated, the problem can result in tissue damage leading to unpleasant occurrences like tendonitis.  Overuse, repetitive motions, or direct trauma to the hand or wrist can also cause flexor tendonitis. The injury is common among athletes, musicians, and manual laborers who frequently use their hands.

## 2022-05-29 NOTE — Progress Notes (Signed)
Tollie Eth, DNP, AGNP-c Primary Care & Sports Medicine [address]  [address] [place], [place] [address] [phone]  Subjective:   Clinton Fisher is a 31 y.o. male presents to day for evaluation of: Acute Visit (Pt presents today back pain is gone. He presents today for left hand pain x 1 week. )  There are no diagnoses linked to this encounter.  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 He reports concerns that the work was going to leave him disabled due to the pain he was experiencing in his hands.  He endorses orthoscopic surgery in his knee several years ago (about 10) and tells me the knee is now "permanently inflamed" and constantly painful. He reports that the pain is so bad in the knee that he feels it has decreased the pain that he can feel in other places.  Low back pain Left sided. Reports he is unable to bend at the waist in any direction. Pain started last Wednesday and has persisted. Initially came on when standing from sitting on the toilet. Felt like a "hot knife" stabbing into the left side of the spine. The pain was most intense when it happened but slowly resolved. He had to use two canes to get up off of the toilet. After that he reports that the pain slowly improved. He tells me that the pain is almost resolved at this time, but he is still concerned.   PMH, Medications, and Allergies reviewed and updated in chart as appropriate.   ROS negative except for what is listed in HPI. Objective:  BP (!) 141/99 Comment: 154/95  Pulse 96   Ht 5\' 9"  (1.753 m)   Wt 188 lb (85.3 kg)   SpO2 100%   BMI 27.76 kg/m  Physical Exam Vitals and nursing note reviewed.  Constitutional:      Appearance: Normal appearance.  HENT:     Head: Normocephalic.  Cardiovascular:     Rate and Rhythm: Normal  rate and regular rhythm.     Pulses: Normal pulses.     Heart sounds: Normal heart sounds.  Pulmonary:     Effort: Pulmonary effort is normal.     Breath sounds: Normal breath sounds.  Musculoskeletal:        General: Tenderness present. No swelling.     Cervical back: Normal range of motion. No rigidity or tenderness.     Right lower leg: No edema.     Left lower leg: No edema.     Comments: Tenderness noted to hands bilaterally both within the joint spaces and soft tissue with light palpation. Tenderness also present to lumbar spine with light palpation. ROM, strength, and sensation intact.   Skin:    General: Skin is warm and dry.     Capillary Refill: Capillary refill takes less than 2 seconds.  Neurological:     General: No focal deficit present.     Mental Status: He is alert and oriented to person, place, and time.     Cranial Nerves: No cranial nerve deficit.     Sensory: No sensory deficit.     Motor: No weakness.     Coordination: Coordination normal.     Gait: Gait normal.     Deep Tendon Reflexes: Reflexes normal.  Psychiatric:  Mood and Affect: Mood normal.           Assessment & Plan:   Problem List Items Addressed This Visit     Tendinitis of flexor tendon of both hands - Primary    Symptoms consistent with tendonitis of the hands bilaterally. Suspect this is due to increased manual, repetitive work with his hands causing inflammation. Recommend gently stretching exercises, ice, heat,  and anti-inflammatory medication to help. If no improvement, recommend ortho evaluation or referral for formal PT.      Relevant Medications   meloxicam (MOBIC) 15 MG tablet   Acute left-sided low back pain without sciatica    Lumbar pain while standing, which is improving. No alarm sx present at this time. Will send for x-ray of the lumbar spine given the sudden onset of pain. Suspect he may have pulled a muscle. If x-rays are clear, recommend formal PT to help with  stretching and strengthening.       Relevant Medications   meloxicam (MOBIC) 15 MG tablet   Other Relevant Orders   DG Lumbar Spine Complete (Completed)      Orma Render, DNP, AGNP-c 07/19/2022  11:00 AM    History, Medications, Surgery, SDOH, and Family History reviewed and updated as appropriate.

## 2022-07-19 ENCOUNTER — Encounter (HOSPITAL_BASED_OUTPATIENT_CLINIC_OR_DEPARTMENT_OTHER): Payer: Self-pay | Admitting: Nurse Practitioner

## 2022-07-19 DIAGNOSIS — M545 Low back pain, unspecified: Secondary | ICD-10-CM | POA: Insufficient documentation

## 2022-07-19 DIAGNOSIS — M778 Other enthesopathies, not elsewhere classified: Secondary | ICD-10-CM | POA: Insufficient documentation

## 2022-07-19 NOTE — Assessment & Plan Note (Signed)
Symptoms consistent with tendonitis of the hands bilaterally. Suspect this is due to increased manual, repetitive work with his hands causing inflammation. Recommend gently stretching exercises, ice, heat,  and anti-inflammatory medication to help. If no improvement, recommend ortho evaluation or referral for formal PT.

## 2022-07-19 NOTE — Assessment & Plan Note (Signed)
Lumbar pain while standing, which is improving. No alarm sx present at this time. Will send for x-ray of the lumbar spine given the sudden onset of pain. Suspect he may have pulled a muscle. If x-rays are clear, recommend formal PT to help with stretching and strengthening.

## 2022-08-16 ENCOUNTER — Other Ambulatory Visit: Payer: Self-pay

## 2022-08-16 ENCOUNTER — Ambulatory Visit
Admission: EM | Admit: 2022-08-16 | Discharge: 2022-08-16 | Disposition: A | Discharge status: Home / Self Care | Payer: Medicaid Other | Attending: Family Medicine | Admitting: Family Medicine

## 2022-08-16 ENCOUNTER — Encounter: Payer: Self-pay | Admitting: Emergency Medicine

## 2022-08-16 DIAGNOSIS — N4889 Other specified disorders of penis: Secondary | ICD-10-CM | POA: Diagnosis not present

## 2022-08-16 MED ORDER — DOXYCYCLINE HYCLATE 100 MG PO CAPS: 100.0000 mg | ORAL_CAPSULE | Freq: Two times a day (BID) | ORAL | 0 refills | Status: DC

## 2022-08-16 NOTE — ED Triage Notes (Signed)
Pt reports "cyst" to base of penis x1 week ago. Pt reports history of similar 10 years ago and reports most recent flare-up started 1 week ago. Pt reports was seen at health clinic on 1/4 and prescribed doxycycline and meloxicam and reports no change in symptoms.

## 2022-08-16 NOTE — ED Provider Notes (Signed)
[place]   [id number] 08/16/22 Arrival Time: 0909  ASSESSMENT & PLAN:  1. Penile swelling    Start: Meds ordered this encounter  Medications   doxycycline (VIBRAMYCIN) 100 MG capsule    Sig: Take 1 capsule (100 mg total) by mouth 2 (two) times daily.    Dispense:  14 capsule    Refill:  0   Orders Placed This Encounter  Procedures   Ambulatory referral to Urology    Referral Priority:   Routine    Referral Type:   Consultation    Referral Reason:   Specialty Services Required    Requested Specialty:   Urology    Number of Visits Requested:   1    Reviewed expectations re: course of current medical issues. Questions answered. Outlined signs and symptoms indicating need for more acute intervention. Patient verbalized understanding. After Visit Summary given.   SUBJECTIVE:  Clinton Fisher is a 32 y.o. male who reports "cyst" to base of penis x1 week ago. Pt reports history of similar 10 years ago and reports most recent flare-up started 1 week ago. Pt reports was seen at health clinic on 1/4 and prescribed doxycycline and meloxicam and reports no change in symptoms. Overall more erythema noted over past week; more tenderness; no drainage or bleeding.  OBJECTIVE:  Vitals:   08/16/22 1022  BP: 133/86  Pulse: 73  Resp: 20  Temp: 98.3 F (36.8 C)  TempSrc: Oral  SpO2: 97%    General appearance: alert, cooperative, appears stated age and no distress GU: normal appearing genitalia except for sub-cm area of mild erythema with cyst-like mass appreciated (approx 0.5 cm); no drainage or bleeding Skin: warm and dry Psychological: alert and cooperative; normal mood and affect.   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   Manic affective disorder with recurrent episode ([place])    Seasonal allergies    sore throat 07/29/2013   Skin problem 12/07/2021   Family History  Problem Relation Age of Onset   Depression Mother    Colon polyps Mother    Social History   Socioeconomic History   Marital status: Single    Spouse name: Not on file   Number of children: 1   Years of education: Not on file   Highest education level: Not on file  Occupational History   Not on file  Tobacco Use   Smoking status: Every Day   Smokeless tobacco: Former  Scientific laboratory technician Use: Every day  Substance and Sexual Activity   Alcohol use: Yes    Comment: every 3 days   Drug use: No   Sexual activity: Not Currently  Other Topics Concern   Not on file  Social History Narrative   Not on file   Social Determinants of Health   Financial Resource Strain: Not on file  Food Insecurity: Not on file  Transportation Needs: Not on file  Physical Activity: Not on file  Stress: Not on file  Social Connections: Not on file  Intimate Partner Violence: Not on file           Vanessa Kick, MD 08/16/22 1133

## 2022-09-11 NOTE — Progress Notes (Signed)
H&P  Chief Complaint: Penile swelling/pain  History of Present Illness: 32 year old male, currently unemployed, presenting for follow-up of an urgent care center visit on the 24th of last month.  The patient has apparently had a cyst on his penis for years.  10 years ago, he states that he went to a urologist either here or in [place] for this cyst which he said ruptured.  He wanted it taken off, apparently the surgeon said no.  Recent presentation to an urgent care center.  The patient was noted to have a 0.5 cm small red erythematous area on the base of his penis.  He was given doxycycline.  He comes in today stating that he has an inch long area of swelling, significant pain and wants a surgical procedure.  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   Manic affective disorder with recurrent episode ([place])    Seasonal allergies    sore throat 07/29/2013   Skin problem 12/07/2021    Past Surgical History:  Procedure Laterality Date   FOOT FOREIGN BODY REMOVAL Right 01/19/2011   KNEE ARTHROSCOPY Right 08/01/2013   Procedure: ARTHROSCOPY RIGHT KNEE, Chondroplasty, Excision of [place];  Surgeon: Alta Corning, MD;  Location: [place] [facility];  Service: Orthopedics;  Laterality: Right;    Home Medications:  Allergies as of 09/12/2022       Reactions   Trazodone And Nefazodone Other (See Comments)   priapism   Amoxicillin Diarrhea   Amoxicillin-pot Clavulanate Diarrhea   Diarrhea   Clavulanic Acid Other (See Comments)   Unknown        Medication List        Accurate as of September 11, 2022  5:41 PM. If you have  any questions, ask your nurse or doctor.          doxycycline 100 MG capsule Commonly known as: VIBRAMYCIN Take 1 capsule (100 mg total) by mouth 2 (two) times daily.        Allergies:  Allergies  Allergen Reactions   Trazodone And Nefazodone Other (See Comments)    priapism   Amoxicillin Diarrhea   Amoxicillin-Pot Clavulanate Diarrhea    Diarrhea    Clavulanic Acid Other (See Comments)    Unknown    Family History  Problem Relation Age of Onset   Depression Mother    Colon polyps Mother     Social History:  reports that he has been smoking. He has quit using smokeless tobacco. He reports current alcohol use. He reports that he does not use drugs.  ROS: A complete review of systems was performed.  All systems are negative except for pertinent findings as noted.  Physical Exam:  Vital signs in last 24 hours: There were no vitals taken for this visit. Constitutional:  Alert and oriented, No acute distress Cardiovascular: Regular rate  Respiratory: Normal respiratory effor Genitourinary: Phallus is circumsize, no lesions.  0.3 mm erythematous area at the base of his penoscrotal junction.  No surrounding erythema, minimal tenderness, no drainage, no fluctuance.  Testicles normal. Neurologic: Grossly intact, no focal deficits Psychiatric: Normal mood and affect  I  have reviewed prior pt notes--I see no prior AUS notes  I have reviewed notes from referring/previous physicians  I have reviewed urinalysis results    Impression/Assessment:  Small penoscrotal skin abscess, resolved.  I see no residual infection signs.  Plan:  1.  I let the patient know that he does not need a procedure to remove any cyst from his scrotum  2.  I offered to have him see somebody for a second opinion or talk to a family member about this-he deferred on this  3.  I will leave it up to him to call us for further follow-up

## 2022-09-12 ENCOUNTER — Encounter: Payer: Self-pay | Admitting: Urology

## 2022-09-12 ENCOUNTER — Ambulatory Visit (INDEPENDENT_AMBULATORY_CARE_PROVIDER_SITE_OTHER): Payer: Medicaid Other | Admitting: Urology

## 2022-09-12 VITALS — BP 124/86 | HR 85

## 2022-09-12 DIAGNOSIS — N4889 Other specified disorders of penis: Secondary | ICD-10-CM | POA: Diagnosis not present

## 2022-09-12 LAB — URINALYSIS, ROUTINE W REFLEX MICROSCOPIC
Bilirubin, UA: NEGATIVE
Glucose, UA: NEGATIVE
Ketones, UA: NEGATIVE
Leukocytes,UA: NEGATIVE
Nitrite, UA: NEGATIVE
Protein,UA: NEGATIVE
Specific Gravity, UA: 1.015 (ref 1.005–1.030)
Urobilinogen, Ur: 0.2 mg/dL (ref 0.2–1.0)
pH, UA: 5 (ref 5.0–7.5)

## 2022-09-12 LAB — MICROSCOPIC EXAMINATION
Bacteria, UA: NONE SEEN
WBC, UA: NONE SEEN /hpf (ref 0–5)

## 2022-09-22 ENCOUNTER — Encounter: Payer: Self-pay | Admitting: Urology

## 2022-09-22 ENCOUNTER — Ambulatory Visit (INDEPENDENT_AMBULATORY_CARE_PROVIDER_SITE_OTHER): Payer: Medicaid Other | Admitting: Urology

## 2022-09-22 VITALS — BP 139/79 | HR 82

## 2022-09-22 DIAGNOSIS — N4889 Other specified disorders of penis: Secondary | ICD-10-CM | POA: Diagnosis not present

## 2022-09-22 DIAGNOSIS — N50819 Testicular pain, unspecified: Secondary | ICD-10-CM

## 2022-09-22 DIAGNOSIS — L723 Sebaceous cyst: Secondary | ICD-10-CM | POA: Diagnosis not present

## 2022-09-22 LAB — URINALYSIS, ROUTINE W REFLEX MICROSCOPIC
Bilirubin, UA: NEGATIVE
Glucose, UA: NEGATIVE
Ketones, UA: NEGATIVE
Leukocytes,UA: NEGATIVE
Nitrite, UA: NEGATIVE
Specific Gravity, UA: 1.025 (ref 1.005–1.030)
Urobilinogen, Ur: 0.2 mg/dL (ref 0.2–1.0)
pH, UA: 5 (ref 5.0–7.5)

## 2022-09-22 LAB — BLADDER SCAN AMB NON-IMAGING: Scan Result: 28

## 2022-09-22 LAB — MICROSCOPIC EXAMINATION: Bacteria, UA: NONE SEEN

## 2022-09-22 MED ORDER — SULFAMETHOXAZOLE-TRIMETHOPRIM 800-160 MG PO TABS: 1.0000 | ORAL_TABLET | Freq: Two times a day (BID) | ORAL | 0 refills | Status: DC

## 2022-09-22 NOTE — Progress Notes (Signed)
post void residual=28

## 2022-09-22 NOTE — Patient Instructions (Signed)
Epidermoid Cyst  An epidermoid cyst, also known as epidermal cyst, is a sac made of skin tissue. The sac contains a substance called keratin. Keratin is a protein that is normally secreted through the hair follicles. When keratin becomes trapped in the top layer of skin (epidermis), it can form an epidermoid cyst. Epidermoid cysts can be found anywhere on your body. These cysts are usually harmless (benign), and they may not cause symptoms unless they become inflamed or infected. What are the causes? This condition may be caused by: A blocked hair follicle. A hair that curls and re-enters the skin instead of growing straight out of the skin (ingrown hair). A blocked pore. Irritated skin. An injury to the skin. Certain conditions that are passed along from parent to child (inherited). Human papillomavirus (HPV). This happens rarely when cysts occur on the bottom of the feet. Long-term (chronic) sun damage to the skin. What increases the risk? The following factors may make you more likely to develop an epidermoid cyst: Having acne. Being male. Having an injury to the skin. Being past puberty. Having certain rare genetic disorders. What are the signs or symptoms? The only symptom of this condition may be a small, painless lump underneath the skin. When an epidermal cyst ruptures, it may become inflamed. True infection in cysts is rare. Symptoms may include: Redness. Inflammation. Tenderness. Warmth. Keratin draining from the cyst. Keratin is grayish-white, bad-smelling substance. Pus draining from the cyst. How is this diagnosed? This condition is diagnosed with a physical exam. In some cases, you may have a sample of tissue (biopsy) taken from your cyst to be examined under a microscope or tested for bacteria. You may be referred to a health care provider who specializes in skin care (dermatologist). How is this treated? If a cyst becomes inflamed, treatment may include: Opening and  draining the cyst, done by a health care provider. After draining, minor surgery to remove the rest of the cyst may be done. Taking antibiotic medicine. Having injections of medicines (steroids) that help to reduce inflammation. Having surgery to remove the cyst. Surgery may be done if the cyst: Becomes large. Bothers you. Has a chance of turning into cancer. Do not try to open a cyst yourself. Follow these instructions at home: Medicines If you were prescribed an antibiotic medicine, take it it as told by your health care provider. Do not stop using the antibiotic even if you start to feel better. Take over-the-counter and prescription medicines only as told by your health care provider. General instructions Keep the area around your cyst clean and dry. Wear loose, dry clothing. Avoid touching your cyst. Check your cyst every day for signs of infection. Check for: Redness, swelling, or pain. Fluid or blood. Warmth. Pus or a bad smell. Keep all follow-up visits. This is important. How is this prevented? Wear clean, dry, clothing. Avoid wearing tight clothing. Keep your skin clean and dry. Take showers or baths every day. Contact a health care provider if: Your cyst develops symptoms of infection. Your condition is not improving or is getting worse. You develop a cyst that looks different from other cysts you have had. You have a fever. Get help right away if: Redness spreads from the cyst into the surrounding area. Summary An epidermoid cyst is a sac made of skin tissue. These cysts are usually harmless (benign), and they may not cause symptoms unless they become inflamed. If a cyst becomes inflamed, treatment may include surgery to open and drain the   cyst, or to remove it. Treatment may also include medicines by mouth or through an injection. Take over-the-counter and prescription medicines only as told by your health care provider. If you were prescribed an antibiotic medicine,  take it as told by your health care provider. Do not stop using the antibiotic even if you start to feel better. Contact a health care provider if your condition is not improving or is getting worse. Keep all follow-up visits as told by your health care provider. This is important. This information is not intended to replace advice given to you by your health care provider. Make sure you discuss any questions you have with your health care provider. Document Revised: 10/15/2019 Document Reviewed: 10/15/2019 Elsevier Patient Education  2023 [employer]  

## 2022-09-22 NOTE — Progress Notes (Signed)
09/22/2022 9:11 AM   Clinton Fisher Jul 16, 1991 [id number]  Referring provider: No referring provider defined for this encounter.  Testicular pain   HPI: Clinton Fisher is a 32yo here for evaluation of testicular pain and scrotal swelling. Starting 2-3 months he has noted worsening scrotal swelling and dull to throbbing intermittent pain. It is not worse with activity. IPSS 16 QOL 6 on no therapy. He was placed doxycyline for 7 days which failed to improve the pain.   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   Manic affective disorder with recurrent episode ([place])    Seasonal allergies    sore throat 07/29/2013   Skin problem 12/07/2021    Surgical History: Past Surgical History:  Procedure Laterality Date   FOOT FOREIGN BODY REMOVAL Right 01/19/2011   KNEE ARTHROSCOPY Right 08/01/2013   Procedure: ARTHROSCOPY RIGHT KNEE, Chondroplasty, Excision of Plica;  Surgeon: Alta Corning, MD;  Location: [facility];  Service: Orthopedics;  Laterality: Right;    Home Medications:  Allergies as of 09/22/2022       Reactions   Trazodone And Nefazodone Other (See Comments)   priapism   Amoxicillin Diarrhea   Amoxicillin-pot Clavulanate Diarrhea   Diarrhea   Clavulanic Acid Other (See Comments)   Unknown        Medication List        Accurate as of September 22, 2022  9:11 AM. If you have any questions, ask your nurse or doctor.          doxycycline 100 MG capsule Commonly known as: VIBRAMYCIN Take 1 capsule (100 mg total) by mouth 2 (two) times daily.        Allergies:  Allergies  Allergen  Reactions   Trazodone And Nefazodone Other (See Comments)    priapism   Amoxicillin Diarrhea   Amoxicillin-Pot Clavulanate Diarrhea    Diarrhea    Clavulanic Acid Other (See Comments)    Unknown    Family History: Family History  Problem Relation Age of Onset   Depression Mother    Colon polyps Mother     Social History:  reports that he has been smoking. He has quit using smokeless tobacco. He reports current alcohol use. He reports that he does not use drugs.  ROS: All other review of systems were reviewed and are negative except what is noted above in HPI  Physical Exam: BP 139/79   Pulse 82   Constitutional:  Alert and oriented, No acute distress. HEENT: [place] AT, moist mucus membranes.  Trachea midline, no masses. Cardiovascular: No clubbing, cyanosis, or edema. Respiratory: Normal respiratory effort, no increased work of breathing. GI: Abdomen is soft, nontender, nondistended, no abdominal masses GU: No CVA tenderness. Circumcised phallus. No masses/lesions on penis, testis, scrotum. 3cm midline anterior scrotal cyst with mild erythema  Lymph: No cervical or inguinal lymphadenopathy. Skin: No rashes, bruises or suspicious lesions. Neurologic: Grossly intact, no focal deficits, moving all 4 extremities. Psychiatric: Normal mood and affect.  Laboratory Data: Lab Results  Component Value Date   WBC 9.8 02/03/2022   HGB 15.8 02/03/2022  HCT 47.1 02/03/2022   MCV 87.4 02/03/2022   PLT 352 02/03/2022    Lab Results  Component Value Date   CREATININE 1.01 02/03/2022    No results found for: "PSA"  No results found for: "TESTOSTERONE"  Lab Results  Component Value Date   HGBA1C 5.1 08/24/2021    Urinalysis    Component Value Date/Time   COLORURINE AMBER (A) 10/15/2012 2158   APPEARANCEUR Clear 09/12/2022 1105   LABSPEC 1.036 (H) 10/15/2012 2158   PHURINE 5.5 10/15/2012 2158   GLUCOSEU Negative 09/12/2022 1105   HGBUR NEGATIVE 10/15/2012 2158    BILIRUBINUR Negative 09/12/2022 1105   KETONESUR 15 (A) 10/15/2012 2158   PROTEINUR Negative 09/12/2022 1105   PROTEINUR 30 (A) 10/15/2012 2158   UROBILINOGEN 0.2 10/15/2012 2158   NITRITE Negative 09/12/2022 1105   NITRITE NEGATIVE 10/15/2012 2158   LEUKOCYTESUR Negative 09/12/2022 1105    Lab Results  Component Value Date   LABMICR See below: 09/12/2022   WBCUA None seen 09/12/2022   LABEPIT 0-10 09/12/2022   BACTERIA None seen 09/12/2022    Pertinent Imaging:  No results found for this or any previous visit.  No results found for this or any previous visit.  No results found for this or any previous visit.  No results found for this or any previous visit.  No results found for this or any previous visit.  No valid procedures specified. No results found for this or any previous visit.  No results found for this or any previous visit.   Assessment & Plan:    1. Penile pain Likely related to scrotal cyst - Urinalysis, Routine w reflex microscopic - BLADDER SCAN AMB NON-IMAGING  2. Sebaceous cyst We discussed the management of scrotal cysts including observation versus excision and the patient elects for excision. Risks/benefits/alterantives   No follow-ups on file.  Nicolette Bang, MD  [facility] [place]

## 2022-10-09 ENCOUNTER — Encounter (HOSPITAL_COMMUNITY): Payer: Self-pay

## 2022-10-09 ENCOUNTER — Emergency Department (HOSPITAL_COMMUNITY): Payer: Medicaid Other

## 2022-10-09 ENCOUNTER — Emergency Department (HOSPITAL_COMMUNITY)
Admission: EM | Admit: 2022-10-09 | Discharge: 2022-10-09 | Disposition: A | Discharge status: Home / Self Care | Payer: Medicaid Other | Attending: Emergency Medicine | Admitting: Emergency Medicine

## 2022-10-09 ENCOUNTER — Other Ambulatory Visit: Payer: Self-pay

## 2022-10-09 DIAGNOSIS — F17210 Nicotine dependence, cigarettes, uncomplicated: Secondary | ICD-10-CM | POA: Insufficient documentation

## 2022-10-09 DIAGNOSIS — R079 Chest pain, unspecified: Secondary | ICD-10-CM

## 2022-10-09 LAB — TROPONIN I (HIGH SENSITIVITY)
Troponin I (High Sensitivity): 3 ng/L (ref <18)
Troponin I (High Sensitivity): <2 ng/L (ref <18)

## 2022-10-09 LAB — CBC
HCT: 44.5 % (ref 39.0–52.0)
Hemoglobin: 14.8 g/dL (ref 13.0–17.0)
MCH: 30.1 pg (ref 26.0–34.0)
MCHC: 33.3 g/dL (ref 30.0–36.0)
MCV: 90.4 fL (ref 80.0–100.0)
Platelets: 278 10*3/uL (ref 150–400)
RBC: 4.92 MIL/uL (ref 4.22–5.81)
RDW: 12.3 % (ref 11.5–15.5)
WBC: 8.2 10*3/uL (ref 4.0–10.5)
nRBC: 0 % (ref 0.0–0.2)

## 2022-10-09 LAB — BASIC METABOLIC PANEL
Anion gap: 8 (ref 5–15)
BUN: 17 mg/dL (ref 6–20)
CO2: 24 mmol/L (ref 22–32)
Calcium: 8.7 mg/dL — ABNORMAL LOW (ref 8.9–10.3)
Chloride: 107 mmol/L (ref 98–111)
Creatinine, Ser: 0.94 mg/dL (ref 0.61–1.24)
GFR, Estimated: >60 mL/min (ref >60)
Glucose, Bld: 113 mg/dL — ABNORMAL HIGH (ref 70–99)
Potassium: 3.7 mmol/L (ref 3.5–5.1)
Sodium: 139 mmol/L (ref 135–145)

## 2022-10-09 LAB — D-DIMER, QUANTITATIVE: D-Dimer, Quant: <0.27 ug/mL-FEU (ref 0.00–0.50)

## 2022-10-09 NOTE — Discharge Instructions (Signed)
You were evaluated in the Emergency Department and after careful evaluation, we did not find any emergent condition requiring admission or further testing in the hospital.  Your exam/testing today is overall reassuring.  Recommend follow-up with your primary care doctor if your symptoms continue.  Until then Tylenol or Motrin for discomfort.  Please return to the Emergency Department if you experience any worsening of your condition.   Thank you for allowing [place] to be a part of your care.

## 2022-10-09 NOTE — ED Triage Notes (Signed)
Pt arrived to triage complaining of chest pain that had been coming and going since last week but tonight it started at 2100 and hasn't gone away.  Pt states that he noticed a knot in his left arm and is concerned about a blood clot   Pt states that he took some motrin without any relief

## 2022-10-09 NOTE — ED Provider Notes (Signed)
[facility] Emergency Department Provider Note MRN:  [id number]  Arrival date & time: 10/09/22     Chief Complaint   Chest Pain   History of Present Illness   Clinton Fisher is a 32 y.o. year-old male with a history of bipolar disorder presenting to the ED with chief complaint of chest pain.  Chest pain left-sided, worse with deep breath.  Also has some soreness or pain to the left arm.  The chest does not hurt when he pushes on the chest and so he does not think he pulled a muscle.  Feels like it is coming from his heart.  Review of Systems  A thorough review of systems was obtained and all systems are negative except as noted in the HPI and PMH.   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   Manic affective disorder with recurrent episode ([place])    Seasonal allergies    sore throat 07/29/2013   Skin problem 12/07/2021    Past Surgical History:  Procedure Laterality Date   FOOT FOREIGN BODY REMOVAL Right 01/19/2011   KNEE ARTHROSCOPY Right 08/01/2013   Procedure: ARTHROSCOPY RIGHT KNEE, Chondroplasty, Excision of Plica;  Surgeon: Alta Corning, MD;  Location: [facility];  Service: Orthopedics;  Laterality: Right;    Family History  Problem Relation Age of Onset   Depression Mother    Colon polyps Mother     Social History   Socioeconomic History   Marital status: Single    Spouse name: Not on file   Number of children: 1   Years of education: Not on file   Highest education level: Not on file  Occupational History   Not on file  Tobacco Use    Smoking status: Every Day    Packs/day: .5    Types: Cigarettes   Smokeless tobacco: Former  Scientific laboratory technician Use: Every day  Substance and Sexual Activity   Alcohol use: Yes    Comment: every 3 days   Drug use: No   Sexual activity: Not Currently  Other Topics Concern   Not on file  Social History Narrative   Not on file   Social Determinants of Health   Financial Resource Strain: Not on file  Food Insecurity: Not on file  Transportation Needs: Not on file  Physical Activity: Not on file  Stress: Not on file  Social Connections: Not on file  Intimate Partner Violence: Not on file     Physical Exam   Vitals:   10/09/22 0315 10/09/22 0400  BP: 115/80 118/79  Pulse: 82 83  Resp: 16 20  Temp:    SpO2: 97% 95%    CONSTITUTIONAL: Well-appearing, NAD NEURO/PSYCH:  Alert and oriented x 3, no focal deficits EYES:  eyes equal and reactive ENT/NECK:  no LAD, no JVD CARDIO: Regular rate, well-perfused, normal S1 and S2 PULM:  CTAB no wheezing or rhonchi GI/GU:  non-distended, non-tender MSK/SPINE:  No gross deformities, no edema SKIN:  no rash, atraumatic   *Additional and/or pertinent findings included in MDM  below  Diagnostic and Interventional Summary    EKG Interpretation  Date/Time:  Monday October 09 2022 00:19:58 EDT Ventricular Rate:  101 PR Interval:  152 QRS Duration: 76 QT Interval:  342 QTC Calculation: 443 R Axis:   64 Text Interpretation: Sinus tachycardia Otherwise normal ECG When compared with ECG of 10-Dec-2020 11:49, PREVIOUS ECG IS PRESENT Confirmed by Gerlene Fee [phone]) on 10/09/2022 2:55:30 AM       Labs Reviewed  BASIC METABOLIC PANEL - Abnormal; Notable for the following components:      Result Value   Glucose, Bld 113 (*)    Calcium 8.7 (*)    All other components within normal limits  CBC  D-DIMER, QUANTITATIVE  TROPONIN I (HIGH SENSITIVITY)  TROPONIN I (HIGH SENSITIVITY)    DG Chest 2 View  Final Result       Medications - No data to display   Procedures  /  Critical Care Procedures  ED Course and Medical Decision Making  Initial Impression and Ddx Favoring noncardiac cause of chest pain.  Patient does smoke tobacco but otherwise not very many cardiovascular risk factors.  Pain started in the left arm and then progressed to the chest.  Atypical.  Suspect stress or MSK or GI etiology.  PE is also considered given the pleuritic nature.  Has been sedentary/immobile recently.  Screening with D-dimer.  Past medical/surgical history that increases complexity of ED encounter: Tobacco use  Interpretation of Diagnostics I personally reviewed the EKG and my interpretation is as follows: Sinus rhythm without concerning ischemic features  Labs reassuring with no significant blood count or electrolyte disturbance.  Troponin negative x 2.  D-dimer negative  Patient Reassessment and Ultimate Disposition/Management     With reassuring workup there is no evidence to suggest an emergent process at this time.  Suspect MSK versus GERD, possibly a component of anxiety.  Appropriate for discharge.  Patient management required discussion with the following services or consulting groups:  None  Complexity of Problems Addressed Acute illness or injury that poses threat of life of bodily function  Additional Data Reviewed and Analyzed Further history obtained from: None  Additional Factors Impacting ED Encounter Risk None  Barth Kirks. Sedonia Small, [place] [email or web address]  Final Clinical Impressions(s) / ED Diagnoses     ICD-10-CM   1. Chest pain, unspecified type  R07.9       ED Discharge Orders     None        Discharge Instructions Discussed with and Provided to Patient:     Discharge Instructions      You were evaluated in the Emergency Department and after careful evaluation, we did not find any emergent condition requiring admission  or further testing in the hospital.  Your exam/testing today is overall reassuring.  Recommend follow-up with your primary care doctor if your symptoms continue.  Until then Tylenol or Motrin for discomfort.  Please return to the Emergency Department if you experience any worsening of your condition.   Thank you for allowing [place] to be a part of your care.       Maudie Flakes, MD 10/09/22 [phone]

## 2022-10-17 NOTE — Telephone Encounter (Signed)
Patient confirmed

## 2022-11-07 ENCOUNTER — Encounter (HOSPITAL_COMMUNITY): Payer: Self-pay

## 2022-11-07 ENCOUNTER — Encounter (HOSPITAL_COMMUNITY)
Admission: RE | Admit: 2022-11-07 | Discharge: 2022-11-07 | Disposition: A | Discharge status: Home / Self Care | Payer: Medicaid Other | Source: Ambulatory Visit | Attending: Urology | Admitting: Urology

## 2022-11-07 ENCOUNTER — Other Ambulatory Visit: Payer: Self-pay

## 2022-11-09 ENCOUNTER — Ambulatory Visit (HOSPITAL_COMMUNITY)
Admission: RE | Admit: 2022-11-09 | Discharge: 2022-11-09 | Disposition: A | Discharge status: Home / Self Care | Payer: Medicaid Other | Attending: Urology | Admitting: Urology

## 2022-11-09 ENCOUNTER — Ambulatory Visit (HOSPITAL_BASED_OUTPATIENT_CLINIC_OR_DEPARTMENT_OTHER): Payer: Medicaid Other | Admitting: Anesthesiology

## 2022-11-09 ENCOUNTER — Encounter (HOSPITAL_COMMUNITY): Payer: Self-pay | Admitting: Urology

## 2022-11-09 ENCOUNTER — Encounter (HOSPITAL_COMMUNITY)
Admission: RE | Disposition: A | Discharge status: Home / Self Care | Payer: Self-pay | Source: Home / Self Care | Attending: Urology

## 2022-11-09 ENCOUNTER — Ambulatory Visit (HOSPITAL_COMMUNITY): Payer: Medicaid Other | Admitting: Anesthesiology

## 2022-11-09 DIAGNOSIS — L720 Epidermal cyst: Secondary | ICD-10-CM | POA: Diagnosis not present

## 2022-11-09 DIAGNOSIS — L723 Sebaceous cyst: Secondary | ICD-10-CM

## 2022-11-09 DIAGNOSIS — N4889 Other specified disorders of penis: Secondary | ICD-10-CM | POA: Insufficient documentation

## 2022-11-09 DIAGNOSIS — F17200 Nicotine dependence, unspecified, uncomplicated: Secondary | ICD-10-CM | POA: Diagnosis not present

## 2022-11-09 DIAGNOSIS — K219 Gastro-esophageal reflux disease without esophagitis: Secondary | ICD-10-CM | POA: Diagnosis not present

## 2022-11-09 DIAGNOSIS — N50819 Testicular pain, unspecified: Secondary | ICD-10-CM | POA: Diagnosis present

## 2022-11-09 HISTORY — PX: SCROTAL EXPLORATION: SHX2386

## 2022-11-09 SURGERY — EXPLORATION, SCROTUM
Anesthesia: General | Site: Scrotum

## 2022-11-09 MED ORDER — PROPOFOL 10 MG/ML IV BOLUS
INTRAVENOUS | Status: DC | PRN
  Administered 2022-11-09: 200 mg via INTRAVENOUS

## 2022-11-09 MED ORDER — LIDOCAINE HCL (CARDIAC) PF 100 MG/5ML IV SOSY
PREFILLED_SYRINGE | INTRAVENOUS | Status: DC | PRN
  Administered 2022-11-09: 60 mg via INTRAVENOUS

## 2022-11-09 MED ORDER — FENTANYL CITRATE (PF) 250 MCG/5ML IJ SOLN
INTRAMUSCULAR | Status: DC | PRN
  Administered 2022-11-09: 25 ug via INTRAVENOUS
  Administered 2022-11-09 (×2): 50 ug via INTRAVENOUS
  Administered 2022-11-09: 25 ug via INTRAVENOUS

## 2022-11-09 MED ORDER — FENTANYL CITRATE (PF) 100 MCG/2ML IJ SOLN
INTRAMUSCULAR | Status: AC
  Filled 2022-11-09: qty 2

## 2022-11-09 MED ORDER — CEFAZOLIN SODIUM-DEXTROSE 2-4 GM/100ML-% IV SOLN
INTRAVENOUS | Status: AC
  Filled 2022-11-09: qty 100

## 2022-11-09 MED ORDER — ONDANSETRON HCL 4 MG/2ML IJ SOLN
INTRAMUSCULAR | Status: DC | PRN
  Administered 2022-11-09: 4 mg via INTRAVENOUS

## 2022-11-09 MED ORDER — ORAL CARE MOUTH RINSE: 15.0000 mL | Freq: Once | OROMUCOSAL | Status: AC

## 2022-11-09 MED ORDER — LACTATED RINGERS IV SOLN: INTRAVENOUS | Status: DC

## 2022-11-09 MED ORDER — HYDROCODONE-ACETAMINOPHEN 5-325 MG PO TABS: 1.0000 | ORAL_TABLET | Freq: Four times a day (QID) | ORAL | 0 refills | Status: DC | PRN

## 2022-11-09 MED ORDER — CHLORHEXIDINE GLUCONATE 0.12 % MT SOLN
15.0000 mL | Freq: Once | OROMUCOSAL | Status: AC
  Administered 2022-11-09: 15 mL via OROMUCOSAL

## 2022-11-09 MED ORDER — MIDAZOLAM HCL 2 MG/2ML IJ SOLN
INTRAMUSCULAR | Status: AC
  Filled 2022-11-09: qty 2

## 2022-11-09 MED ORDER — PROPOFOL 10 MG/ML IV BOLUS
INTRAVENOUS | Status: AC
  Filled 2022-11-09: qty 20

## 2022-11-09 MED ORDER — HYDROMORPHONE HCL 1 MG/ML IJ SOLN: 0.2500 mg | INTRAMUSCULAR | Status: DC | PRN

## 2022-11-09 MED ORDER — MEPERIDINE HCL 50 MG/ML IJ SOLN: 6.2500 mg | INTRAMUSCULAR | Status: DC | PRN

## 2022-11-09 MED ORDER — MIDAZOLAM HCL 2 MG/2ML IJ SOLN
INTRAMUSCULAR | Status: DC | PRN
  Administered 2022-11-09: 2 mg via INTRAVENOUS

## 2022-11-09 MED ORDER — BUPIVACAINE HCL 0.25 % IJ SOLN
INTRAMUSCULAR | Status: DC | PRN
  Administered 2022-11-09: 10 mL

## 2022-11-09 MED ORDER — 0.9 % SODIUM CHLORIDE (POUR BTL) OPTIME
TOPICAL | Status: DC | PRN
  Administered 2022-11-09: 1000 mL

## 2022-11-09 MED ORDER — ONDANSETRON HCL 4 MG/2ML IJ SOLN: 4.0000 mg | Freq: Once | INTRAMUSCULAR | Status: DC | PRN

## 2022-11-09 MED ORDER — BUPIVACAINE HCL (PF) 0.25 % IJ SOLN
INTRAMUSCULAR | Status: AC
  Filled 2022-11-09: qty 30

## 2022-11-09 MED ORDER — SPOT INK MARKER SYRINGE KIT
PACK | SUBMUCOSAL | Status: AC
  Filled 2022-11-09: qty 5

## 2022-11-09 MED ORDER — CEFAZOLIN SODIUM-DEXTROSE 2-4 GM/100ML-% IV SOLN
2.0000 g | INTRAVENOUS | Status: AC
  Administered 2022-11-09: 2 g via INTRAVENOUS

## 2022-11-09 SURGICAL SUPPLY — 31 items
ADH SKN CLS APL DERMABOND .7 (GAUZE/BANDAGES/DRESSINGS) ×1
BNDG GAUZE ELAST 4 BULKY (GAUZE/BANDAGES/DRESSINGS) IMPLANT
COVER LIGHT HANDLE STERIS (MISCELLANEOUS) ×2 IMPLANT
DERMABOND ADVANCED .7 DNX12 (GAUZE/BANDAGES/DRESSINGS) IMPLANT
ELECT REM PT RETURN 9FT ADLT (ELECTROSURGICAL) ×1
ELECTRODE REM PT RTRN 9FT ADLT (ELECTROSURGICAL) ×1 IMPLANT
GAUZE SPONGE 4X4 12PLY STRL (GAUZE/BANDAGES/DRESSINGS) ×1 IMPLANT
GLOVE BIO SURGEON STRL SZ8 (GLOVE) ×1 IMPLANT
GLOVE BIOGEL PI IND STRL 7.0 (GLOVE) ×2 IMPLANT
GLOVE BIOGEL PI IND STRL 8 (GLOVE) ×1 IMPLANT
GOWN STRL REUS W/TWL LRG LVL3 (GOWN DISPOSABLE) ×1 IMPLANT
GOWN STRL REUS W/TWL XL LVL3 (GOWN DISPOSABLE) ×1 IMPLANT
KIT TURNOVER KIT A (KITS) ×1 IMPLANT
MANIFOLD NEPTUNE II (INSTRUMENTS) ×1 IMPLANT
NDL HYPO 25X1 1.5 SAFETY (NEEDLE) ×1 IMPLANT
NEEDLE HYPO 25X1 1.5 SAFETY (NEEDLE) ×1 IMPLANT
PACK MINOR (CUSTOM PROCEDURE TRAY) ×1 IMPLANT
PAD ARMBOARD 7.5X6 YLW CONV (MISCELLANEOUS) ×1 IMPLANT
SET BASIN LINEN APH (SET/KITS/TRAYS/PACK) ×1 IMPLANT
SOL PREP POV-IOD 4OZ 10% (MISCELLANEOUS) ×1 IMPLANT
SOL PREP PROV IODINE SCRUB 4OZ (MISCELLANEOUS) ×1 IMPLANT
SUPPORT SCROTAL LRG NO STRP (SOFTGOODS) IMPLANT
SUT CHROMIC 3 0 SH 27 (SUTURE) IMPLANT
SUT CHROMIC 4 0 PS 2 18 (SUTURE) IMPLANT
SUT MNCRL AB 4-0 PS2 18 (SUTURE) IMPLANT
SUT PROLENE 4 0 RB 1 (SUTURE)
SUT PROLENE 4-0 RB1 .5 CRCL 36 (SUTURE) IMPLANT
SUT SILK 0 FSL (SUTURE) IMPLANT
SUT VIC AB 3-0 SH 27 (SUTURE)
SUT VIC AB 3-0 SH 27X BRD (SUTURE) IMPLANT
SYR CONTROL 10ML LL (SYRINGE) ×1 IMPLANT

## 2022-11-09 NOTE — Anesthesia Preprocedure Evaluation (Signed)
Anesthesia Evaluation  Patient identified by MRN, date of birth, ID band Patient awake    Reviewed: Allergy & Precautions, H&P , NPO status , Patient's Chart, lab work & pertinent test results  Airway Mallampati: I  TM Distance: >3 FB Neck ROM: Full    Dental  (+) Dental Advisory Given, Teeth Intact   Pulmonary Current Smoker and Patient abstained from smoking.   Pulmonary exam normal breath sounds clear to auscultation       Cardiovascular negative cardio ROS Normal cardiovascular exam Rhythm:Regular Rate:Normal     Neuro/Psych  PSYCHIATRIC DISORDERS Anxiety  Bipolar Disorder   negative neurological ROS     GI/Hepatic Neg liver ROS,GERD  Medicated and Controlled,,  Endo/Other  negative endocrine ROS    Renal/GU negative Renal ROS  negative genitourinary   Musculoskeletal  (+) Arthritis , Osteoarthritis,    Abdominal   Peds negative pediatric ROS (+)  Hematology negative hematology ROS (+)   Anesthesia Other Findings Acute left-sided low back pain without sciatica  Reproductive/Obstetrics negative OB ROS                             Anesthesia Physical Anesthesia Plan  ASA: 2  Anesthesia Plan: General   Post-op Pain Management: Dilaudid IV   Induction: Intravenous  PONV Risk Score and Plan: 3 and Ondansetron, Dexamethasone and Midazolam  Airway Management Planned: LMA  Additional Equipment:   Intra-op Plan:   Post-operative Plan: Extubation in OR  Informed Consent: I have reviewed the patients History and Physical, chart, labs and discussed the procedure including the risks, benefits and alternatives for the proposed anesthesia with the patient or authorized representative who has indicated his/her understanding and acceptance.     Dental advisory given  Plan Discussed with: CRNA and Surgeon  Anesthesia Plan Comments:         Anesthesia Quick Evaluation

## 2022-11-09 NOTE — Anesthesia Postprocedure Evaluation (Signed)
Anesthesia Post Note  Patient: Clinton Fisher  Procedure(s) Performed: SCROTUM EXPLORATION-excision of scrotal sebaceous cyst (Scrotum)  Patient location during evaluation: Phase II Anesthesia Type: General Level of consciousness: awake and alert and oriented Pain management: pain level controlled Vital Signs Assessment: post-procedure vital signs reviewed and stable Respiratory status: spontaneous breathing, nonlabored ventilation and respiratory function stable Cardiovascular status: blood pressure returned to baseline and stable Postop Assessment: no apparent nausea or vomiting Anesthetic complications: no  No notable events documented.   Last Vitals:  Vitals:   11/09/22 0953 11/09/22 0956  BP:  (!) 139/90  Pulse: 72 70  Resp: 15 16  Temp:  36.6 C  SpO2: 95% 98%    Last Pain:  Vitals:   11/09/22 0956  TempSrc: Oral  PainSc: 0-No pain                 Lesean Woolverton C Antwan Pandya

## 2022-11-09 NOTE — Transfer of Care (Signed)
Immediate Anesthesia Transfer of Care Note  Patient: Clinton Fisher  Procedure(s) Performed: SCROTUM EXPLORATION-excision of scrotal sebaceous cyst (Scrotum)  Patient Location: PACU  Anesthesia Type:General  Level of Consciousness: drowsy  Airway & Oxygen Therapy: Patient Spontanous Breathing and Patient connected to nasal cannula oxygen  Post-op Assessment: Report given to RN and Post -op Vital signs reviewed and stable  Post vital signs: Reviewed and stable  Last Vitals:  Vitals Value Taken Time  BP 103/66   Temp 97.5   Pulse 56   Resp 11   SpO2 97%     Last Pain:  Vitals:   11/09/22 0745  TempSrc: Oral  PainSc: 0-No pain      Patients Stated Pain Goal: 8 (11/09/22 0745)  Complications: No notable events documented.

## 2022-11-09 NOTE — Op Note (Signed)
Preoperative diagnosis: scrotal cyst   Postoperative diagnosis: Same   Procedure: 1. Excision of scrotal cyst   Attending: Wilkie Aye, MD   Anesthesia: General   History of blood loss: Minimal   Antibiotics: ancef   Drains: none   Specimens: 1. Scrotal/perineal cyst cyst     Findings: 3cm x 3cm scrotal cyst at penoscrotal junction   Indications: Patient is a 32 year old male with a history of a scrotal sebaceous cyst that was growing in size and causing him pain with ambulation.  We discussed the treatment options including observation versus excision after discussing treatment options he proceed with excision.    Procedure in detail: Prior to procedure consent was obtained.  Patient was brought to the operating room and a brief timeout was done to ensure correct patient, correct procedure, correct site.  General anesthesia was administered and patient was placed in supine position.  His genitalia was then prepped and draped in usual sterile fashion.  A 4 cm elliptical incision was made around the scrotal cyst.  We dissected under the sebaceous cyst and removed the surrounding scrotal skin. We then obtained hemostasis with electrocautery. . The skin was then closed with 4-0 monocryl in a running fashion. A dressing was then applied to the incision.  We then placed a scrotal fluff and this then concluded the procedure which was well tolerated by the patient.   Complications: None   Condition: Stable, extubated, transferred to PACU.   Plan: Patient is to be discharged home.  He is to follow up in 2 weeks for wound check.

## 2022-11-09 NOTE — H&P (Signed)
HPI: Clinton Fisher is a 32yo here for evaluation of testicular pain and scrotal swelling. Starting 2-3 months he has noted worsening scrotal swelling and dull to throbbing intermittent pain. It is not worse with activity. IPSS 16 QOL 6 on no therapy. He was placed doxycyline for 7 days which failed to improve the pain.     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   Manic affective disorder with recurrent episode ([facility])     Seasonal allergies      sore throat 07/29/2013   Skin problem 12/07/2021      Surgical History:      Past Surgical History:  Procedure Laterality Date   FOOT FOREIGN BODY REMOVAL Right 01/19/2011   KNEE ARTHROSCOPY Right 08/01/2013    Procedure: ARTHROSCOPY RIGHT KNEE, Chondroplasty, Excision of Plica;  Surgeon: Harvie Junior, MD;  Location: [facility];  Service: Orthopedics;  Laterality: Right;      Home Medications:  Allergies as of 09/22/2022         Reactions    Trazodone And Nefazodone Other (See Comments)    priapism    Amoxicillin Diarrhea    Amoxicillin-pot Clavulanate Diarrhea    Diarrhea    Clavulanic Acid Other (See Comments)    Unknown            Medication List           Accurate as of September 22, 2022  9:11 AM. If you have any questions, ask your nurse or doctor.              doxycycline 100 MG capsule Commonly known as: VIBRAMYCIN Take 1 capsule (100 mg total) by mouth 2 (two) times daily.             Allergies:       Allergies  Allergen Reactions   Trazodone And Nefazodone Other (See Comments)      priapism   Amoxicillin Diarrhea    Amoxicillin-Pot Clavulanate Diarrhea      Diarrhea     Clavulanic Acid Other (See Comments)      Unknown      Family History:      Family History  Problem Relation Age of Onset   Depression Mother     Colon polyps Mother        Social History:  reports that he has been smoking. He has quit using smokeless tobacco. He reports current alcohol use. He reports that he does not use drugs.   ROS: All other review of systems were reviewed and are negative except what is noted above in HPI   Physical Exam: BP 139/79   Pulse 82   Constitutional:  Alert and oriented, No acute distress. HEENT: [place] AT, moist mucus membranes.  Trachea midline, no masses. Cardiovascular: No clubbing, cyanosis, or edema. Respiratory: Normal respiratory effort, no increased work of breathing. GI: Abdomen is soft, nontender, nondistended, no abdominal masses GU: No CVA tenderness. Circumcised phallus. No masses/lesions on penis, testis, scrotum. 3cm midline anterior scrotal cyst with mild erythema  Lymph: No  cervical or inguinal lymphadenopathy. Skin: No rashes, bruises or suspicious lesions. Neurologic: Grossly intact, no focal deficits, moving all 4 extremities. Psychiatric: Normal mood and affect.   Laboratory Data: Recent Labs       Lab Results  Component Value Date    WBC 9.8 02/03/2022    HGB 15.8 02/03/2022    HCT 47.1 02/03/2022    MCV 87.4 02/03/2022    PLT 352 02/03/2022        Recent Labs       Lab Results  Component Value Date    CREATININE 1.01 02/03/2022        Recent Labs  No results found for: "PSA"     Recent Labs  No results found for: "TESTOSTERONE"     Recent Labs       Lab Results  Component Value Date    HGBA1C 5.1 08/24/2021        Urinalysis Labs (Brief)          Component Value Date/Time    COLORURINE AMBER (A) 10/15/2012 2158    APPEARANCEUR Clear 09/12/2022 1105    LABSPEC 1.036 (H) 10/15/2012 2158    PHURINE 5.5 10/15/2012 2158    GLUCOSEU  Negative 09/12/2022 1105    HGBUR NEGATIVE 10/15/2012 2158    BILIRUBINUR Negative 09/12/2022 1105    KETONESUR 15 (A) 10/15/2012 2158    PROTEINUR Negative 09/12/2022 1105    PROTEINUR 30 (A) 10/15/2012 2158    UROBILINOGEN 0.2 10/15/2012 2158    NITRITE Negative 09/12/2022 1105    NITRITE NEGATIVE 10/15/2012 2158    LEUKOCYTESUR Negative 09/12/2022 1105        Recent Labs       Lab Results  Component Value Date    LABMICR See below: 09/12/2022    WBCUA None seen 09/12/2022    LABEPIT 0-10 09/12/2022    BACTERIA None seen 09/12/2022        Pertinent Imaging:   No results found for this or any previous visit.   No results found for this or any previous visit.   No results found for this or any previous visit.   No results found for this or any previous visit.   No results found for this or any previous visit.   No valid procedures specified. No results found for this or any previous visit.   No results found for this or any previous visit.     Assessment & Plan:     1. Penile pain Likely related to scrotal cyst - Urinalysis, Routine w reflex microscopic - BLADDER SCAN AMB NON-IMAGING   2. Sebaceous cyst We discussed the management of scrotal cysts including observation versus excision and the patient elects for excision. Risks/benefits/alterantives discussed     No follow-ups on file.   Wilkie Aye, MD   [facility] [place]

## 2022-11-09 NOTE — Anesthesia Procedure Notes (Signed)
Procedure Name: LMA Insertion Date/Time: 11/09/2022 8:50 AM  Performed by: Lorin Glass, CRNAPre-anesthesia Checklist: Emergency Drugs available, Patient identified, Suction available and Patient being monitored Patient Re-evaluated:Patient Re-evaluated prior to induction Oxygen Delivery Method: Circle system utilized Preoxygenation: Pre-oxygenation with 100% oxygen Induction Type: IV induction LMA: LMA inserted LMA Size: 4.0 Number of attempts: 1 Placement Confirmation: positive ETCO2 and breath sounds checked- equal and bilateral Tube secured with: Tape Dental Injury: Teeth and Oropharynx as per pre-operative assessment

## 2022-11-10 LAB — SURGICAL PATHOLOGY

## 2022-11-13 ENCOUNTER — Emergency Department (HOSPITAL_BASED_OUTPATIENT_CLINIC_OR_DEPARTMENT_OTHER)
Admission: EM | Admit: 2022-11-13 | Discharge: 2022-11-14 | Disposition: A | Discharge status: Home / Self Care | Payer: Medicaid Other | Attending: Emergency Medicine | Admitting: Emergency Medicine

## 2022-11-13 ENCOUNTER — Encounter (HOSPITAL_BASED_OUTPATIENT_CLINIC_OR_DEPARTMENT_OTHER): Payer: Self-pay | Admitting: Emergency Medicine

## 2022-11-13 ENCOUNTER — Other Ambulatory Visit: Payer: Self-pay

## 2022-11-13 ENCOUNTER — Emergency Department (HOSPITAL_BASED_OUTPATIENT_CLINIC_OR_DEPARTMENT_OTHER): Payer: Medicaid Other

## 2022-11-13 DIAGNOSIS — G8918 Other acute postprocedural pain: Secondary | ICD-10-CM | POA: Diagnosis not present

## 2022-11-13 DIAGNOSIS — N5089 Other specified disorders of the male genital organs: Secondary | ICD-10-CM | POA: Diagnosis present

## 2022-11-13 LAB — URINALYSIS, ROUTINE W REFLEX MICROSCOPIC
Bacteria, UA: NONE SEEN
Bilirubin Urine: NEGATIVE
Glucose, UA: NEGATIVE mg/dL
Ketones, ur: NEGATIVE mg/dL
Leukocytes,Ua: NEGATIVE
Nitrite: NEGATIVE
Protein, ur: 30 mg/dL — AB
Specific Gravity, Urine: 1.032 — ABNORMAL HIGH (ref 1.005–1.030)
pH: 5.5 (ref 5.0–8.0)

## 2022-11-13 NOTE — ED Provider Notes (Signed)
[facility] Provider Note: Clinton Dell, MD, FACEP  CSN: [id number] MRN: [id number] ARRIVAL: 11/13/22 at 1941 ROOM: DB016/DB016   CHIEF COMPLAINT  Groin Swelling   HISTORY OF PRESENT ILLNESS  11/13/22 11:59 PM Clinton Fisher is a 32 y.o. male who had surgical removal of a scrotal epidermoid inclusion cyst on 11/09/2022 by Dr. Ronne Binning of urology.  He is here with increased swelling of the left testicle.  He rates his associated pain as a 9 out of 10.  He is also having some difficulty initiating urination.  He is not having fever or chills.    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   Manic affective disorder with recurrent episode    Seasonal allergies    sore throat 07/29/2013   Skin problem 12/07/2021    Past Surgical History:  Procedure Laterality Date   FOOT FOREIGN BODY REMOVAL Right 01/19/2011   KNEE ARTHROSCOPY Right 08/01/2013   Procedure: ARTHROSCOPY RIGHT KNEE, Chondroplasty, Excision of Plica;  Surgeon: Harvie Junior, MD;  Location: [facility];  Service: Orthopedics;  Laterality: Right;    Family History  Problem Relation Age of Onset   Depression Mother    Colon polyps Mother     Social History   Tobacco Use   Smoking status: Every Day    Types: E-cigarettes   Smokeless tobacco: Former  Building services engineer Use: Every day  Substance Use Topics   Alcohol use: Yes    Comment: every 3 days   Drug use: No    Prior to Admission medications   Medication Sig Start Date End Date Taking? Authorizing Provider  HYDROcodone-acetaminophen (NORCO) 5-325 MG tablet Take 1 tablet by mouth every 6 (six) hours as needed for moderate pain. 11/09/22   McKenzie, Mardene Celeste, MD  meloxicam  (MOBIC) 7.5 MG tablet Take 7.5 mg by mouth 2 (two) times daily as needed for pain. 07/27/22   [provider]    Allergies Trazodone and nefazodone, Amoxicillin, Amoxicillin-pot clavulanate, and Clavulanic acid   REVIEW OF SYSTEMS  Negative except as noted here or in the History of Present Illness.   PHYSICAL EXAMINATION  Initial Vital Signs Blood pressure (!) 140/100, pulse (!) 104, temperature 98.4 F (36.9 C), temperature source Oral, resp. rate 15, SpO2 99 %.  Examination General: Well-developed, well-nourished male in no acute distress; appearance consistent with age of record HENT: normocephalic; atraumatic Eyes: Appearance Neck: supple Heart: regular rate and rhythm Lungs: clear to auscultation bilaterally Abdomen: soft; nondistended; nontender; bowel sounds present GU: Lynk V male, circumcised; well-healing surgical incision of the midline ventral surface of the proximal penis and scrotum without signs of infection; tenderness of left hemiscrotum without mass or swelling palpated Extremities: No deformity; full range of motion; pulses normal Neurologic: Awake, alert and oriented; motor function intact in all extremities and symmetric; no facial droop Skin: Warm and dry Psychiatric: Normal mood and affect   RESULTS  Summary of this visit's results, reviewed and interpreted by myself:   EKG Interpretation  Date/Time:    Ventricular Rate:    PR Interval:    QRS Duration:   QT Interval:    QTC Calculation:   R Axis:  Text Interpretation:         Laboratory Studies: Results for orders placed or performed during the hospital encounter of 11/13/22 (from the past 24 hour(s))  Urinalysis, Routine w reflex microscopic -Urine, Clean Catch     Status: Abnormal   Collection Time: 11/13/22  8:15 PM  Result Value Ref Range   Color, Urine YELLOW YELLOW   APPearance CLEAR CLEAR   Specific Gravity, Urine 1.032 (H) 1.005 - 1.030   pH 5.5 5.0 - 8.0   Glucose,  UA NEGATIVE NEGATIVE mg/dL   Hgb urine dipstick SMALL (A) NEGATIVE   Bilirubin Urine NEGATIVE NEGATIVE   Ketones, ur NEGATIVE NEGATIVE mg/dL   Protein, ur 30 (A) NEGATIVE mg/dL   Nitrite NEGATIVE NEGATIVE   Leukocytes,Ua NEGATIVE NEGATIVE   RBC / HPF 0-5 0 - 5 RBC/hpf   WBC, UA 0-5 0 - 5 WBC/hpf   Bacteria, UA NONE SEEN NONE SEEN   Squamous Epithelial / HPF 0-5 0 - 5 /HPF   Mucus PRESENT    Imaging Studies: US SCROTUM W/DOPPLER  Result Date: 11/13/2022 CLINICAL DATA:  Left testicular pain. Previous history of scrotal exploratory surgery. EXAM: SCROTAL ULTRASOUND DOPPLER ULTRASOUND OF THE TESTICLES TECHNIQUE: Complete ultrasound examination of the testicles, epididymis, and other scrotal structures was performed. Color and spectral Doppler ultrasound were also utilized to evaluate blood flow to the testicles. COMPARISON:  None Available. FINDINGS: Right testicle Measurements: 4.4 x 2.5 x 3.6 cm. No mass or microlithiasis visualized. Left testicle Measurements: 4.2 x 2.7 x 2.6 cm. No mass or microlithiasis visualized. Right epididymis: 2 simple appearing cysts consistent with epididymal cysts or spermatoceles. Largest measures 4 mm maximal diameter. Left epididymis:  Normal in size and appearance. Hydrocele:  None visualized. 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 Pulsed Doppler interrogation of both testes demonstrates normal low resistance arterial and venous waveforms bilaterally. IMPRESSION: 1. Normal ultrasound appearance of the testicles. No evidence of testicular mass, torsion, or inflammatory process. 2. Bilateral scrotal varicoceles.  Right epididymal cyst. Electronically Signed   By: Burman Nieves M.D.   On: 11/13/2022 21:15    ED COURSE and MDM  Nursing notes, initial and subsequent vitals signs, including pulse oximetry, reviewed and interpreted by myself.  Vitals:   11/13/22 2011 11/14/22 0006  BP: (!) 140/100 (!) 126/90  Pulse: (!) 104 67  Resp: 15 18  Temp: 98.4 F  (36.9 C) 98.3 F (36.8 C)  TempSrc: Oral   SpO2: 99% 98%   Medications - No data to display  The patient's physical exam is consistent with expected healing of the procedure he had.  No evidence of infection.  No evidence of epididymitis or orchitis.  His difficulty starting his stream may be due to some restriction of the urethra as the incision closure overlies the proximal penile urethra.  No evidence of urinary tract infection on urinalysis.  PROCEDURES  Procedures   ED DIAGNOSES     ICD-10-CM   1. Postoperative pain  G89.18          Asir Bingley, Jonny Ruiz, MD 11/14/22 [phone]

## 2022-11-13 NOTE — ED Triage Notes (Signed)
Pt states recent surgery 4/18 for scrotum exploration-excision of scrotal sebaceous cyst. Pt c/o worsening swelling on left testicle. Area not visualized in triage. Endorses difficulty initiating urination. No fevers/chills.

## 2022-11-13 NOTE — ED Provider Notes (Incomplete)
[facility] Provider Note: Clinton Dell, MD, FACEP  CSN: [id number] MRN: [id number] ARRIVAL: 11/13/22 at 1941 ROOM: DB016/DB016   CHIEF COMPLAINT  Groin Swelling   HISTORY OF PRESENT ILLNESS  11/13/22 11:59 PM Clinton Fisher is a 32 y.o. male who had surgical removal of a scrotal epidermoid inclusion cyst on 11/09/2022 by Dr. Ronne Binning of urology.  He is here with increased swelling of the left testicle.  He rates his associated pain as a 9 out of 10.  He is also having some difficulty initiating urination.  He is not having fever or chills.   Past Medical History:  Diagnosis Date  . Abrasion of index finger 07/29/2013   left  . Bipolar I disorder, current or most recent episode manic, severe 12/14/2020  . 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 Fissure, anal 03/18/2021  . GERD (gastroesophageal reflux disease)    no current med.  . Manic affective disorder with recurrent episode   . Seasonal allergies    sore throat 07/29/2013  . Skin problem 12/07/2021    Past Surgical History:  Procedure Laterality Date  . FOOT FOREIGN BODY REMOVAL Right 01/19/2011  . KNEE ARTHROSCOPY Right 08/01/2013   Procedure: ARTHROSCOPY RIGHT KNEE, Chondroplasty, Excision of Plica;  Surgeon: Harvie Junior, MD;  Location: [facility];  Service: Orthopedics;  Laterality: Right;    Family History  Problem Relation Age of Onset  . Depression Mother   . Colon polyps Mother     Social History   Tobacco Use  . Smoking status: Every Day    Types: E-cigarettes  . Smokeless tobacco: Former  Advertising account planner  . Vaping Use: Every day  Substance Use Topics  . Alcohol use: Yes    Comment: every 3 days  . Drug use: No    Prior to Admission medications   Medication Sig Start Date End Date Taking? Authorizing Provider  HYDROcodone-acetaminophen (NORCO) 5-325 MG tablet Take 1 tablet by mouth every 6 (six) hours as needed for moderate pain. 11/09/22   McKenzie, Mardene Celeste, MD   meloxicam (MOBIC) 7.5 MG tablet Take 7.5 mg by mouth 2 (two) times daily as needed for pain. 07/27/22   [provider]    Allergies Trazodone and nefazodone, Amoxicillin, Amoxicillin-pot clavulanate, and Clavulanic acid   REVIEW OF SYSTEMS  Negative except as noted here or in the History of Present Illness.   PHYSICAL EXAMINATION  Initial Vital Signs Blood pressure (!) 140/100, pulse (!) 104, temperature 98.4 F (36.9 C), temperature source Oral, resp. rate 15, SpO2 99 %.  Examination General: Well-developed, well-nourished male in no acute distress; appearance consistent with age of record HENT: normocephalic; atraumatic Eyes: pupils equal, round and reactive to light; extraocular muscles intact Neck: supple Heart: regular rate and rhythm; no murmurs, rubs or gallops Lungs: clear to auscultation bilaterally Abdomen: soft; nondistended; nontender; no masses or hepatosplenomegaly; bowel sounds present Extremities: No deformity; full range of motion; pulses normal Neurologic: Awake, alert and oriented; motor function intact in all extremities and symmetric; no facial droop Skin: Warm and dry Psychiatric: Normal mood and affect   RESULTS  Summary of this visit's results, reviewed and interpreted by myself:   EKG Interpretation  Date/Time:    Ventricular Rate:    PR Interval:    QRS Duration:   QT Interval:    QTC Calculation:   R Axis:     Text Interpretation:         Laboratory  Studies: Results for orders placed or performed during the hospital encounter of 11/13/22 (from the past 24 hour(s))  Urinalysis, Routine w reflex microscopic -Urine, Clean Catch     Status: Abnormal   Collection Time: 11/13/22  8:15 PM  Result Value Ref Range   Color, Urine YELLOW YELLOW   APPearance CLEAR CLEAR   Specific Gravity, Urine 1.032 (H) 1.005 - 1.030   pH 5.5 5.0 - 8.0   Glucose, UA NEGATIVE NEGATIVE mg/dL   Hgb urine dipstick SMALL (A) NEGATIVE   Bilirubin Urine  NEGATIVE NEGATIVE   Ketones, ur NEGATIVE NEGATIVE mg/dL   Protein, ur 30 (A) NEGATIVE mg/dL   Nitrite NEGATIVE NEGATIVE   Leukocytes,Ua NEGATIVE NEGATIVE   RBC / HPF 0-5 0 - 5 RBC/hpf   WBC, UA 0-5 0 - 5 WBC/hpf   Bacteria, UA NONE SEEN NONE SEEN   Squamous Epithelial / HPF 0-5 0 - 5 /HPF   Mucus PRESENT    Imaging Studies: US SCROTUM W/DOPPLER  Result Date: 11/13/2022 CLINICAL DATA:  Left testicular pain. Previous history of scrotal exploratory surgery. EXAM: SCROTAL ULTRASOUND DOPPLER ULTRASOUND OF THE TESTICLES TECHNIQUE: Complete ultrasound examination of the testicles, epididymis, and other scrotal structures was performed. Color and spectral Doppler ultrasound were also utilized to evaluate blood flow to the testicles. COMPARISON:  None Available. FINDINGS: Right testicle Measurements: 4.4 x 2.5 x 3.6 cm. No mass or microlithiasis visualized. Left testicle Measurements: 4.2 x 2.7 x 2.6 cm. No mass or microlithiasis visualized. Right epididymis: 2 simple appearing cysts consistent with epididymal cysts or spermatoceles. Largest measures 4 mm maximal diameter. Left epididymis:  Normal in size and appearance. Hydrocele:  None visualized. 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 Pulsed Doppler interrogation of both testes demonstrates normal low resistance arterial and venous waveforms bilaterally. IMPRESSION: 1. Normal ultrasound appearance of the testicles. No evidence of testicular mass, torsion, or inflammatory process. 2. Bilateral scrotal varicoceles.  Right epididymal cyst. Electronically Signed   By: Burman Nieves M.D.   On: 11/13/2022 21:15    ED COURSE and MDM  Nursing notes, initial and subsequent vitals signs, including pulse oximetry, reviewed and interpreted by myself.  Vitals:   11/13/22 2011  BP: (!) 140/100  Pulse: (!) 104  Resp: 15  Temp: 98.4 F (36.9 C)  TempSrc: Oral  SpO2: 99%   Medications - No data to display    PROCEDURES  Procedures   ED  DIAGNOSES  No diagnosis found.

## 2022-11-14 ENCOUNTER — Encounter (HOSPITAL_COMMUNITY): Payer: Self-pay | Admitting: Urology

## 2022-11-18 ENCOUNTER — Emergency Department (HOSPITAL_BASED_OUTPATIENT_CLINIC_OR_DEPARTMENT_OTHER)
Admission: EM | Admit: 2022-11-18 | Discharge: 2022-11-18 | Disposition: A | Discharge status: Home / Self Care | Payer: Medicaid Other | Attending: Emergency Medicine | Admitting: Emergency Medicine

## 2022-11-18 ENCOUNTER — Encounter (HOSPITAL_BASED_OUTPATIENT_CLINIC_OR_DEPARTMENT_OTHER): Payer: Self-pay | Admitting: Emergency Medicine

## 2022-11-18 ENCOUNTER — Other Ambulatory Visit: Payer: Self-pay

## 2022-11-18 DIAGNOSIS — N503 Cyst of epididymis: Secondary | ICD-10-CM | POA: Diagnosis not present

## 2022-11-18 DIAGNOSIS — Z5189 Encounter for other specified aftercare: Secondary | ICD-10-CM | POA: Diagnosis not present

## 2022-11-18 NOTE — Discharge Instructions (Addendum)
Please follow-up with Dr. Ronne Binning this week.  Please call Monday and request an earlier appointment than May 7 Please return to the ED with any new symptoms such as fevers, body aches or chills

## 2022-11-18 NOTE — ED Provider Notes (Signed)
[place] EMERGENCY DEPARTMENT AT [facility] Provider Note   CSN: [id number] Arrival date & time: 11/18/22  6962     History No chief complaint on file.   Clinton Fisher is a 32 y.o. male with medical history of GERD, seasonal allergies, manic affective disorder, anal fissure.  Patient presents to the ED for evaluation of surgical wound.  The patient had a scrotal epidermoid inclusion cyst removed on 4/18 by Dr. Ronne Binning of urology.  The patient states that since this time he has had flaking of his surgical glue, irritation which he attributes to his surgical glue. The patient was seen again on 4/22 in this ED with an unremarkable workup and discharged home.  Patient returns today complaining of increased moisture and drainage from his surgical site, pain.  Patient states he is still taking hydrocodone as needed.  Patient concerned for infection.  Patient denies dysuria, fevers, body aches or chills.  HPI     Home Medications Prior to Admission medications   Medication Sig Start Date End Date Taking? Authorizing Provider  HYDROcodone-acetaminophen (NORCO) 5-325 MG tablet Take 1 tablet by mouth every 6 (six) hours as needed for moderate pain. 11/09/22   McKenzie, Mardene Celeste, MD  meloxicam (MOBIC) 7.5 MG tablet Take 7.5 mg by mouth 2 (two) times daily as needed for pain. 07/27/22   [provider]      Allergies    Trazodone and nefazodone, Amoxicillin, Amoxicillin-pot clavulanate, and Clavulanic acid    Review of Systems   Review of Systems  Skin:  Positive for wound.  All other systems reviewed and are negative.   Physical Exam Updated Vital Signs BP (!) 147/95   Pulse (!) 102   Temp 98.9 F (37.2 C) (Oral)   Resp 20   SpO2 100%  Physical Exam Vitals and nursing note reviewed.  Constitutional:      General: He is not in acute distress.    Appearance: Normal appearance. He is not ill-appearing, toxic-appearing or diaphoretic.  HENT:     Head:  Normocephalic and atraumatic.     Nose: Nose normal.     Mouth/Throat:     Mouth: Mucous membranes are moist.     Pharynx: Oropharynx is clear.  Eyes:     Extraocular Movements: Extraocular movements intact.     Conjunctiva/sclera: Conjunctivae normal.     Pupils: Pupils are equal, round, and reactive to light.  Cardiovascular:     Rate and Rhythm: Normal rate and regular rhythm.  Pulmonary:     Effort: Pulmonary effort is normal.     Breath sounds: Normal breath sounds. No wheezing.  Abdominal:     General: Abdomen is flat. Bowel sounds are normal.     Palpations: Abdomen is soft.     Tenderness: There is no abdominal tenderness.  Genitourinary:    Comments: Surgical incision of the midline ventral surface of the proximal penis and scrotum without signs of infection, no purulent drainage, slight clear drainage however no erythema Musculoskeletal:     Cervical back: Normal range of motion and neck supple. No tenderness.  Skin:    General: Skin is warm and dry.     Capillary Refill: Capillary refill takes less than 2 seconds.  Neurological:     Mental Status: He is alert and oriented to person, place, and time.       ED Results / Procedures / Treatments   Labs (all labs ordered are listed, but only abnormal results are displayed) Labs  Reviewed - No data to display  EKG None  Radiology No results found.  Procedures Procedures   Medications Ordered in ED Medications - No data to display  ED Course/ Medical Decision Making/ A&P  Medical Decision Making  32 year old male presents to ED for wound check.  Please see HPI for further details.  Patient physical exam consistent with expected healing procedure that he experience.  No evidence of infection, purulent drainage, surrounding erythema, induration.  No evidence of epididymitis, orchitis.  No evidence of testicular torsion.  Patient denies dysuria.  Patient when appears well-healing.  Patient will be advised to  follow-up with Dr. Ronne Binning this week.  Patient encouraged to call on Monday, request an earlier appointment than May 7.  Patient provided gauze to keep wound clean at home.  Patient provided return precautions and he voiced understanding.  All questions answered to satisfaction.  Stable for discharge.   Final Clinical Impression(s) / ED Diagnoses Final diagnoses:  Visit for wound check    Rx / DC Orders ED Discharge Orders     None         Clent Ridges 11/18/22 1837    Gwyneth Sprout, MD 11/21/22 [phone]

## 2022-11-18 NOTE — ED Triage Notes (Signed)
Pt had surgery on 4/18. Came here on the 22nd worried about swelling and pain. Pt feels like the surgical glue on the surgical  site is causing irritation. Pt still requires hydrocodone for pain control.

## 2022-11-19 ENCOUNTER — Other Ambulatory Visit: Payer: Self-pay

## 2022-11-19 ENCOUNTER — Emergency Department (HOSPITAL_COMMUNITY)
Admission: EM | Admit: 2022-11-19 | Discharge: 2022-11-19 | Disposition: A | Discharge status: Home / Self Care | Payer: Medicaid Other | Attending: Emergency Medicine | Admitting: Emergency Medicine

## 2022-11-19 DIAGNOSIS — Z4800 Encounter for change or removal of nonsurgical wound dressing: Secondary | ICD-10-CM | POA: Diagnosis present

## 2022-11-19 DIAGNOSIS — Z5189 Encounter for other specified aftercare: Secondary | ICD-10-CM

## 2022-11-19 MED ORDER — HYDROCODONE-ACETAMINOPHEN 5-325 MG PO TABS: 1.0000 | ORAL_TABLET | Freq: Four times a day (QID) | ORAL | 0 refills | Status: DC | PRN

## 2022-11-19 NOTE — ED Triage Notes (Signed)
Pt had surgery on 4/18 to have cyst removed from groin. Pt states "I think my morning wood bust open my site" pt has been seen at 2 ER this week for the same and said that site is not healing correctly.

## 2022-11-19 NOTE — ED Provider Notes (Signed)
[place] EMERGENCY DEPARTMENT AT [facility] Provider Note   CSN: [id number] Arrival date & time: 11/19/22  1830     History  Chief Complaint  Patient presents with   Wound Check    Clinton Fisher is a 32 y.o. male.  Patient reports he had a epididymal cyst removed from his scrotum by Dr. Ronne Binning.  Patient reports that he is concerned about the healing of this area.  Patient is concerned that he is going to lose his scrotum and penis.  Patient reports the area was glued to close the outer layer.  Patient complains of having dark clumps on the tissue.  Patient reports area looks yellow.  The history is provided by the patient. No language interpreter was used.  Wound Check This is a new problem. The problem occurs constantly. The problem has been gradually worsening. Nothing aggravates the symptoms. Nothing relieves the symptoms. He has tried nothing for the symptoms. The treatment provided no relief.       Home Medications Prior to Admission medications   Medication Sig Start Date End Date Taking? Authorizing Provider  HYDROcodone-acetaminophen (NORCO) 5-325 MG tablet Take 1 tablet by mouth every 6 (six) hours as needed for moderate pain. 11/09/22   McKenzie, Mardene Celeste, MD  meloxicam (MOBIC) 7.5 MG tablet Take 7.5 mg by mouth 2 (two) times daily as needed for pain. 07/27/22   [provider]      Allergies    Trazodone and nefazodone, Amoxicillin, Amoxicillin-pot clavulanate, and Clavulanic acid    Review of Systems   Review of Systems  All other systems reviewed and are negative.   Physical Exam Updated Vital Signs BP (!) 146/102 (BP Location: Right Arm)   Pulse (!) 102   Temp 99.1 F (37.3 C) (Oral)   Resp 20   Ht 5\' 9"  (1.753 m)   Wt 88.5 kg   SpO2 98%   BMI 28.81 kg/m  Physical Exam Vitals reviewed.  Constitutional:      Appearance: Normal appearance.  Cardiovascular:     Rate and Rhythm: Normal rate.  Genitourinary:    Penis:  Normal.      Comments: Patient has granulation tissue forming over the incision, there appears to be a small area of wound dehiscence that is healing.  There is no sign of infection.  I removed 3 clumped up areas of glue that were bothering the patient. Skin:    General: Skin is warm.  Neurological:     General: No focal deficit present.     Mental Status: He is alert.  Psychiatric:        Mood and Affect: Mood normal.     ED Results / Procedures / Treatments   Labs (all labs ordered are listed, but only abnormal results are displayed) Labs Reviewed - No data to display  EKG None  Radiology No results found.  Procedures Procedures    Medications Ordered in ED Medications - No data to display  ED Course/ Medical Decision Making/ A&P                             Medical Decision Making Pt is concerned that surgical incision is not healing correctly.    Amount and/or Complexity of Data Reviewed External Data Reviewed: notes.    Details: Urology notes and ED notes reviewed  Risk Risk Details: Pt reassured that area is healing well.  No sign of infection.  Pt advised to follow up with Dr. Ronne Binning Pt complains of persistant pain.  Pt given rx for pain           Final Clinical Impression(s) / ED Diagnoses Final diagnoses:  Visit for wound check    Rx / DC Orders ED Discharge Orders          Ordered    HYDROcodone-acetaminophen (NORCO) 5-325 MG tablet  Every 6 hours PRN        11/19/22 1938           An After Visit Summary was printed and given to the patient.    Osie Cheeks 11/19/22 Vickki Muff, MD 11/23/22 [phone]

## 2022-11-20 ENCOUNTER — Telehealth: Payer: Self-pay

## 2022-11-20 NOTE — Telephone Encounter (Signed)
I called patient to follow up on triage call.  Patient states nothing is needed at this time he will follow up with MD as scheduled on 05/03.

## 2022-11-23 ENCOUNTER — Telehealth: Payer: Self-pay

## 2022-11-24 ENCOUNTER — Ambulatory Visit (INDEPENDENT_AMBULATORY_CARE_PROVIDER_SITE_OTHER): Payer: Medicaid Other | Admitting: Urology

## 2022-11-24 ENCOUNTER — Encounter: Payer: Self-pay | Admitting: Urology

## 2022-11-24 VITALS — BP 131/84 | HR 66

## 2022-11-24 DIAGNOSIS — N50819 Testicular pain, unspecified: Secondary | ICD-10-CM

## 2022-11-24 MED ORDER — BACITRACIN 500 UNIT/GM EX OINT: 1.0000 | TOPICAL_OINTMENT | Freq: Two times a day (BID) | CUTANEOUS | 3 refills | Status: DC

## 2022-11-24 MED ORDER — HYDROCODONE-ACETAMINOPHEN 5-325 MG PO TABS: 1.0000 | ORAL_TABLET | Freq: Four times a day (QID) | ORAL | 0 refills | Status: DC | PRN

## 2022-11-24 NOTE — Patient Instructions (Signed)
Epidermoid Cyst Drainage Epidermoid cyst drainage is a procedure to drain a fluid-filled sac that forms under your skin (epidermoid cyst). This type of cyst is filled with a thick, oily substance that is secreted by your skin glands. Epidermoid cysts are usually painless. You can often move the cyst under your skin. Sometimes an epidermoid cyst gets inflamed. It may become red, swollen, and painful. In this case, you may need this procedure to drain the cyst and provide relief from the discomfort caused by an inflamed cyst. Cysts that are treated only with drainage often come back (recur). You may need to have the cyst completely removed after healing from this procedure. Tell a health care provider about: Any allergies you have. All medicines you are taking, including vitamins, herbs, eye drops, creams, and over-the-counter medicines. Any problems you or family members have had with anesthetic medicines. Any blood disorders you have. Any surgeries you have had. Any medical conditions you have. Whether you are pregnant or may be pregnant. What are the risks? Generally, this is a safe procedure. However, problems may occur, including: Cyst recurrence. Infection. Bleeding. Allergic reactions to medicines. What happens before the procedure? Ask your health care provider about: Changing or stopping your regular medicines. This is especially important if you are taking diabetes medicines or blood thinners. Taking medicines such as aspirin and ibuprofen. These medicines can thin your blood. Do not take these medicines unless your health care provider tells you to take them. Taking over-the-counter medicines, vitamins, herbs, and supplements. Ask your heath care provider what steps will be taken to help prevent infection. This may include washing your skin with a germ-killing soap. What happens during the procedure? The skin around the cyst will be injected with a numbing medicine (local  anesthetic). An incision will be made over the cyst, and the wall of the cyst will be opened. A spreading instrument will be used to open up the cyst. The contents of the cyst will be removed with suction or irrigation. A thin strip of gauze packing may be placed in the cyst to keep it open and draining. The incision will be left open, and the cyst will be covered with a bandage (dressing). The procedure may vary among health care providers and hospitals. What can I expect after the procedure? After the procedure, it is common to have: Soreness. Blood-tinged fluid draining from the cyst. This drainage may stain your dressing. Follow these instructions at home: Medicines Take over-the-counter and prescription medicines only as told by your health care provider. If you were prescribed an antibiotic medicine, take it as told by your health care provider. Do not stop taking the antibiotic even if you start to feel better. Incision care  Follow instructions from your health care provider about how to take care of your incision. Make sure you: Wash your hands with soap and water for at least 20 seconds before and after you change your dressing. If soap and water are not available, use hand sanitizer. Change your dressing as told by your health care provider. Do not remove the packing. Do not try to put it back in if it falls out. You may need to return to your health care provider in a few days to have the packing removed. After your packing is removed, follow instructions from your health care provider about how to keep your incision area clean. Check your incision area every day for signs of infection. Check for: Redness, swelling, or pain. More fluid or blood.   Warmth. Pus or a bad smell. General instructions Return to your normal activities as told by your health care provider. Ask your health care provider what activities are safe for you. Do not take baths, swim, or use a hot tub until  your health care provider approves. Ask your health care provider if you may take showers. You may need to return to your health care provider to have the cyst removed after you heal from the drainage procedure. Keep all follow-up visits. This is important. Contact a health care provider if: You have chills or a fever. Blood soaks through your dressing. You have any signs of infection, especially spreading redness or increased pus coming from the cyst. Summary Epidermoid cyst drainage is a procedure to drain a fluid-filled sac that forms underneath your skin. If an epidermoid cyst gets inflamed, you may need to have a procedure to drain the cyst and relieve the discomfort caused by an inflamed cyst. During epidermoid cyst drainage, you will get local anesthesia so the cyst can be opened and the contents removed. You may have packing gauze put in the cyst to keep it open and draining. If you were prescribed an antibiotic medicine, take it as told by your health care provider. Do not stop taking the antibiotic even if you start to feel better. Epidermoid cysts often come back after drainage. You may need to have the cyst removed after you heal from the drainage procedure. This information is not intended to replace advice given to you by your health care provider. Make sure you discuss any questions you have with your health care provider. Document Revised: 10/15/2019 Document Reviewed: 10/15/2019 Elsevier Patient Education  2023 Elsevier Inc.  

## 2022-11-24 NOTE — Progress Notes (Unsigned)
11/24/2022 9:42 AM   Clinton Fisher 1991-03-08 [id number]  Referring provider: No referring provider defined for this encounter.  No chief complaint on file.   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   Manic affective disorder with recurrent episode (HCC)    Seasonal allergies    sore throat 07/29/2013   Skin problem 12/07/2021    Surgical History: Past Surgical History:  Procedure Laterality Date   FOOT FOREIGN BODY REMOVAL Right 01/19/2011   KNEE ARTHROSCOPY Right 08/01/2013   Procedure: ARTHROSCOPY RIGHT KNEE, Chondroplasty, Excision of Plica;  Surgeon: Harvie Junior, MD;  Location: [facility];  Service: Orthopedics;  Laterality: Right;   SCROTAL EXPLORATION N/A 11/09/2022   Procedure: SCROTUM EXPLORATION-excision of scrotal sebaceous cyst;  Surgeon: Malen Gauze, MD;  Location: [facility];  Service: Urology;  Laterality: N/A;    Home Medications:  Allergies as of 11/24/2022       Reactions   Trazodone And Nefazodone Other (See Comments)   priapism   Amoxicillin Diarrhea   Amoxicillin-pot Clavulanate Diarrhea   Diarrhea   Clavulanic Acid Other (See Comments)   Unknown        Medication List        Accurate as of Nov 24, 2022  9:42 AM. If you have any questions, ask your nurse or doctor.          bacitracin 500 UNIT/GM ointment Apply 1 Application topically 2 (two) times daily. Started by: Wilkie Aye, MD   HYDROcodone-acetaminophen 5-325 MG tablet Commonly known as: Norco Take 1 tablet by mouth every 6 (six) hours as needed for moderate pain.   meloxicam 7.5 MG tablet Commonly known as: MOBIC Take 7.5 mg by mouth 2 (two) times daily as needed for pain.        Allergies:  Allergies  Allergen Reactions   Trazodone And  Nefazodone Other (See Comments)    priapism   Amoxicillin Diarrhea   Amoxicillin-Pot Clavulanate Diarrhea    Diarrhea    Clavulanic Acid Other (See Comments)    Unknown    Family History: Family History  Problem Relation Age of Onset   Depression Mother    Colon polyps Mother     Social History:  reports that he has been smoking e-cigarettes. He has quit using smokeless tobacco. He reports current alcohol use. He reports that he does not use drugs.  ROS: All other review of systems were reviewed and are negative except what is noted above in HPI  Physical Exam: BP 131/84   Pulse 66   Constitutional:  Alert and oriented, No acute distress. HEENT: [place] AT, moist mucus membranes.  Trachea midline, no masses. Cardiovascular: No clubbing, cyanosis, or edema. Respiratory: Normal respiratory effort, no increased work of breathing. GI: Abdomen is soft, nontender, nondistended, no abdominal masses GU: No CVA tenderness.  Lymph: No cervical or inguinal lymphadenopathy. Skin: No rashes, bruises or suspicious lesions. Neurologic: Grossly intact, no focal deficits, moving all 4 extremities. Psychiatric: Normal mood and affect.  Laboratory Data: Lab Results  Component Value Date   WBC 8.2 10/09/2022   HGB 14.8 10/09/2022   HCT 44.5 10/09/2022   MCV 90.4 10/09/2022   PLT 278 10/09/2022    Lab Results  Component Value  Date   CREATININE 0.94 10/09/2022    No results found for: "PSA"  No results found for: "TESTOSTERONE"  Lab Results  Component Value Date   HGBA1C 5.1 08/24/2021    Urinalysis    Component Value Date/Time   COLORURINE YELLOW 11/13/2022 2015   APPEARANCEUR CLEAR 11/13/2022 2015   APPEARANCEUR Clear 09/22/2022 0948   LABSPEC 1.032 (H) 11/13/2022 2015   PHURINE 5.5 11/13/2022 2015   GLUCOSEU NEGATIVE 11/13/2022 2015   HGBUR SMALL (A) 11/13/2022 2015   BILIRUBINUR NEGATIVE 11/13/2022 2015   BILIRUBINUR Negative 09/22/2022 0948   KETONESUR NEGATIVE  11/13/2022 2015   PROTEINUR 30 (A) 11/13/2022 2015   UROBILINOGEN 0.2 10/15/2012 2158   NITRITE NEGATIVE 11/13/2022 2015   LEUKOCYTESUR NEGATIVE 11/13/2022 2015    Lab Results  Component Value Date   LABMICR See below: 09/22/2022   WBCUA 0-5 09/22/2022   LABEPIT 0-10 09/22/2022   BACTERIA NONE SEEN 11/13/2022    Pertinent Imaging: *** No results found for this or any previous visit.  No results found for this or any previous visit.  No results found for this or any previous visit.  No results found for this or any previous visit.  No results found for this or any previous visit.  No valid procedures specified. No results found for this or any previous visit.  No results found for this or any previous visit.   Assessment & Plan:    1. Pain in testicle, unspecified laterality *** - Urinalysis, Routine w reflex microscopic   No follow-ups on file.  Wilkie Aye, MD  [facility] [place]

## 2022-12-22 ENCOUNTER — Ambulatory Visit: Payer: Medicaid Other | Admitting: Urology

## 2022-12-22 DIAGNOSIS — N50819 Testicular pain, unspecified: Secondary | ICD-10-CM

## 2022-12-29 ENCOUNTER — Telehealth: Payer: Self-pay

## 2022-12-29 NOTE — Telephone Encounter (Signed)
Patient called to request the no show be removed from his chart. He advised he called on 8/30 and spoke to a receptionist and she advised she would cancel the appointment. He was having car trouble and could not make it.

## 2022-12-31 NOTE — Telephone Encounter (Signed)
Clinton Fisher,  A ticket will need to be placed please to IT to have this changed.  Thanks

## 2023-01-24 ENCOUNTER — Ambulatory Visit (HOSPITAL_BASED_OUTPATIENT_CLINIC_OR_DEPARTMENT_OTHER): Payer: MEDICAID

## 2023-01-24 ENCOUNTER — Encounter (HOSPITAL_BASED_OUTPATIENT_CLINIC_OR_DEPARTMENT_OTHER): Payer: Self-pay | Admitting: Family Medicine

## 2023-01-24 ENCOUNTER — Ambulatory Visit (INDEPENDENT_AMBULATORY_CARE_PROVIDER_SITE_OTHER): Payer: MEDICAID | Admitting: Family Medicine

## 2023-01-24 ENCOUNTER — Other Ambulatory Visit (HOSPITAL_BASED_OUTPATIENT_CLINIC_OR_DEPARTMENT_OTHER): Payer: Self-pay | Admitting: Family Medicine

## 2023-01-24 VITALS — BP 132/94 | HR 78 | Ht 69.0 in | Wt 214.1 lb

## 2023-01-24 DIAGNOSIS — M25431 Effusion, right wrist: Secondary | ICD-10-CM

## 2023-01-24 DIAGNOSIS — M25531 Pain in right wrist: Secondary | ICD-10-CM

## 2023-01-24 DIAGNOSIS — M65831 Other synovitis and tenosynovitis, right forearm: Secondary | ICD-10-CM | POA: Diagnosis not present

## 2023-01-24 MED ORDER — PREDNISONE 10 MG (21) PO TBPK: ORAL_TABLET | ORAL | 0 refills | Status: DC

## 2023-01-24 NOTE — Progress Notes (Signed)
Acute Care Office Visit  Subjective:   Clinton Fisher Aug 10, 1990 01/24/2023  Chief Complaint  Patient presents with   Joint Swelling    Pt said he has had swelling in right wrist for about 2 weeks. States he was swinging an axe doing some cutting which he believes that may be the cause of the swelling and pain.    HPI: Pain to right wrist ongoing x 2 weeks after using axe to cut down tree. He felt a "twisting motion to right wrist" on initial hit of axe. He has had mild pain and swelling to right wrist since injury.  Denies numbness, tingling or warmth. He is starting a new job and has been using wrist for new computer program with repetitive motion. Tried Tylenol once without relief. Denies change in range of motion, popping, clicking, or weakness to right wrist. Denies rashes or redness.    The following portions of the patient's history were reviewed and updated as appropriate: past medical history, past surgical history, family history, social history, allergies, medications, and problem list.   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   Manic affective disorder with recurrent episode (HCC)    Seasonal allergies    sore throat 07/29/2013   Skin problem 12/07/2021   Past Surgical History:  Procedure  Laterality Date   FOOT FOREIGN BODY REMOVAL Right 01/19/2011   KNEE ARTHROSCOPY Right 08/01/2013   Procedure: ARTHROSCOPY RIGHT KNEE, Chondroplasty, Excision of Plica;  Surgeon: Harvie Junior, MD;  Location: [facility];  Service: Orthopedics;  Laterality: Right;   SCROTAL EXPLORATION N/A 11/09/2022   Procedure: SCROTUM EXPLORATION-excision of scrotal sebaceous cyst;  Surgeon: Malen Gauze, MD;  Location: [facility];  Service: Urology;  Laterality: N/A;   Family History  Problem Relation Age of Onset   Depression Mother    Colon polyps Mother    Outpatient Medications Prior to Visit  Medication Sig Dispense Refill   acetaminophen (TYLENOL) 500 MG tablet Take 500 mg by mouth every 6 (six) hours as needed for mild pain or moderate pain.     fluticasone (CUTIVATE) 0.05 % cream Apply 1 Application topically as needed (apply as needed to face).     clobetasol (TEMOVATE) 0.05 % external solution Apply 1 Application topically as needed (apply to scalp as needed). (Patient not taking: Reported on 01/24/2023)     bacitracin 500 UNIT/GM ointment Apply 1 Application topically 2 (two) times daily. 15 g 3   HYDROcodone-acetaminophen (NORCO) 5-325 MG tablet Take 1 tablet by mouth every 6 (six) hours as needed for moderate pain. 30 tablet 0   meloxicam (MOBIC) 7.5 MG tablet Take 7.5 mg by mouth 2 (two) times daily as needed for pain.     No facility-administered medications  prior to visit.   Allergies  Allergen Reactions   Trazodone And Nefazodone Other (See Comments)    priapism   Amoxicillin Diarrhea   Amoxicillin-Pot Clavulanate Diarrhea    Diarrhea    Clavulanic Acid Other (See Comments)    Unknown     ROS: A complete ROS was performed with pertinent positives/negatives noted in the HPI. The remainder of the ROS are negative.    Objective:   Today's Vitals   01/24/23 1035  BP: (!) 132/94  Pulse: 78  SpO2: 98%  Weight: 214 lb 1.6 oz (97.1 kg)  Height: 5\' 9"  (1.753 m)     GENERAL: Well-appearing, in NAD. Well nourished.  SKIN: Pink, warm and dry. No rash, lesion, ulceration, or ecchymoses.  Head: Normocephalic. NECK: Trachea midline. Full ROM w/o pain or tenderness. No lymphadenopathy.  RESPIRATORY: Chest wall symmetrical. Respirations even and non-labored.  MSK: Muscle tone and strength appropriate for age. +2 radial pulses bilaterally. Mild tenderness with palpation to anterior surface of right wrist.  Mild pain with varus and valgus stress of right wrist. Mild pain with Finklesteins test. Negative Phalen's.  EXTREMITIES: Without clubbing, cyanosis, or edema.  NEUROLOGIC: No motor or sensory deficits. Steady, even gait. C2-C12 intact.  PSYCH/MENTAL STATUS: Alert, oriented x 3. Cooperative, appropriate mood and affect.   Narrative & Impression  CLINICAL DATA:  Wrist swelling following repetitive injury   EXAM: RIGHT WRIST - COMPLETE 3+ VIEW   COMPARISON:  None Available.   FINDINGS: There is no evidence of fracture or dislocation. There is no evidence of arthropathy or other focal bone abnormality. Soft tissues are unremarkable.   IMPRESSION: Negative.     Electronically Signed   By: Emmaline Kluver M.D.   On: 01/24/2023 11:17       Assessment & Plan:  1. Extensor tenosynovitis of right wrist 2. Pain and swelling of right wrist  Patient obtained xray of right wrist at [facility]. Negative for fracture or soft tissue swelling. Likely extensor tenosynovitis of right wrist.  Recommend starting Prednisone taper for inflammation, use Naproxen 500mg  BID with food for 3-4 days and wear thumb spica splint daily and at night for the next 3-4 weeks. If no improvement, will consider referral to Ortho and/or PT as needed.     Orders Placed This Encounter  Procedures   DG Wrist Complete Right   Lab Orders  No laboratory test(s) ordered today   No images are attached to the encounter or orders placed in the encounter.  Return if symptoms  worsen or fail to improve.    Patient to reach out to office if new, worrisome, or unresolved symptoms arise or if no improvement in patient's condition. Patient verbalized understanding and is agreeable to treatment plan. All questions answered to patient's satisfaction.    Yolanda Manges, FNP

## 2023-01-24 NOTE — Patient Instructions (Signed)
The included instructions are helpful for tendon inflammation, not just for De Quervain's Tenosynovitis.

## 2023-01-24 NOTE — Progress Notes (Signed)
Please let patient know his xray is negative for fracture or soft tissue injury. He should start Prednisone taper (sent in to Pharmacy) , use Naproxen (Aleve) BID with food for 3-4 days and use a thumb spica splint for the next 3-4 weeks. Take breaks with the splint and ice wrist for 20 min on and off. If no improvement in 3-4 weeks, call office.

## 2023-01-31 ENCOUNTER — Ambulatory Visit (INDEPENDENT_AMBULATORY_CARE_PROVIDER_SITE_OTHER): Payer: MEDICAID | Admitting: Family Medicine

## 2023-01-31 ENCOUNTER — Encounter (HOSPITAL_BASED_OUTPATIENT_CLINIC_OR_DEPARTMENT_OTHER): Payer: Self-pay | Admitting: Family Medicine

## 2023-01-31 VITALS — BP 134/96 | HR 116 | Temp 97.6°F | Ht 69.0 in | Wt 215.6 lb

## 2023-01-31 DIAGNOSIS — M65831 Other synovitis and tenosynovitis, right forearm: Secondary | ICD-10-CM

## 2023-01-31 DIAGNOSIS — Z0000 Encounter for general adult medical examination without abnormal findings: Secondary | ICD-10-CM

## 2023-01-31 NOTE — Progress Notes (Signed)
Acute Care Office Visit  Subjective:   Clinton Fisher 04/28/1991 01/31/2023  Chief Complaint  Patient presents with   Follow-up    Pt here for f/u on wrist pain, pt stated wrist is not getting any better at all     HPI: Extensor Tenosynovitis of Right Wrist  Patient completed prednisone taper, using thumb spica splint and is icing the wrist regularly. HE states the pain has not improved. He is taking 1000mg  Tylenol regularly for pain relief. He is elevating his wrist when at rest. He has been exercising and stretching it as tolerated. Denies progression or worsening of pain. He denies numbness, tingling, increased swelling or further injury to right wrist. Patient did have wrist xray last visit which was negative for fracture or soft tissue swelling.   The following portions of the patient's history were reviewed and updated as appropriate: past medical history, past surgical history, family history, social history, allergies, medications, and problem list.   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   Manic affective disorder with recurrent episode (HCC)    Seasonal allergies    sore throat 07/29/2013   Skin problem 12/07/2021   Past Surgical History:  Procedure Laterality Date   FOOT FOREIGN BODY  REMOVAL Right 01/19/2011   KNEE ARTHROSCOPY Right 08/01/2013   Procedure: ARTHROSCOPY RIGHT KNEE, Chondroplasty, Excision of Plica;  Surgeon: Harvie Junior, MD;  Location: [facility];  Service: Orthopedics;  Laterality: Right;   SCROTAL EXPLORATION N/A 11/09/2022   Procedure: SCROTUM EXPLORATION-excision of scrotal sebaceous cyst;  Surgeon: Malen Gauze, MD;  Location: [facility];  Service: Urology;  Laterality: N/A;   Family History  Problem Relation Age of Onset   Depression Mother    Colon polyps Mother    Outpatient Medications Prior to Visit  Medication Sig Dispense Refill   acetaminophen (TYLENOL) 500 MG tablet Take 500 mg by mouth every 6 (six) hours as needed for mild pain or moderate pain.     doxycycline (VIBRAMYCIN) 100 MG capsule Take 100 mg by mouth 2 (two) times daily.     fluticasone (CUTIVATE) 0.05 % cream Apply 1 Application topically as needed (apply as needed to face).     clobetasol (TEMOVATE) 0.05 % external solution Apply 1 Application topically as needed (apply to scalp as needed). (Patient not taking: Reported on 01/24/2023)     predniSONE (STERAPRED UNI-PAK 21 TAB) 10 MG (21) TBPK tablet Take tablets as directed by taper dose pack for 6 days. (Patient not taking: Reported on 01/31/2023) 1 each 0   No facility-administered medications prior to visit.   Allergies  Allergen Reactions   Trazodone And Nefazodone Other (See Comments)    priapism   Amoxicillin Diarrhea   Amoxicillin-Pot Clavulanate Diarrhea  Diarrhea    Clavulanic Acid Other (See Comments)    Unknown     ROS: A complete ROS was performed with pertinent positives/negatives noted in the HPI. The remainder of the ROS are negative.    Objective:   Today's Vitals   01/31/23 1035 01/31/23 1054  BP: (!) 137/104 (!) 134/96  Pulse: (!) 116   Temp: 97.6 F (36.4 C)   TempSrc: Oral   SpO2: 98%   Weight: 215 lb 9.6 oz (97.8 kg)   Height: 5\' 9"  (1.753 m)     GENERAL: Well-appearing,  in NAD. Well nourished.  SKIN: Pink, warm and dry. No rash, lesion, ulceration, or ecchymoses.  Head: Normocephalic. NECK: Trachea midline. Full ROM w/o pain or tenderness. No lymphadenopathy.  RESPIRATORY: Chest wall symmetrical. Respirations even and non-labored.  MSK: Muscle tone and strength appropriate for age. Joints w/o tenderness, redness, or swelling. Full ROM present to right wrist. No obvious edema or deformity. +2 radial pulses bilaterally. Mild tenderness with palpation to anterior surface of right wrist. Mild pain with varus and valgus stress of right wrist. Negative Phalen's  EXTREMITIES: Without clubbing, cyanosis, or edema.  NEUROLOGIC: No motor or sensory deficits. Steady, even gait. C2-C12 intact.  PSYCH/MENTAL STATUS: Alert, oriented x 3. Cooperative, appropriate mood and affect.    No results found for any visits on 01/31/23.    Assessment & Plan:  1. Extensor tenosynovitis of right wrist Recommend referral to Orthopedics for further evaluation and possible injection if needed. Continue with thumb spica brace, ice, rest, and elevation. Recommended stretches for relief as well.   - Ambulatory referral to Orthopedics  2. Healthcare maintenance Patient due for AE. Lab orders placed and patient will obtain fasting labs prior to exam. Patient reports BP elevates chronically in office. Will recheck with AE.   - Lipid panel; Future - TSH; Future - Comprehensive metabolic panel; Future - CBC with Differential/Platelet; Future - Hemoglobin A1c; Future    Return in about 4 weeks (around 02/28/2023) for ANNUAL PHYSICAL (fasting labs prior) .    Patient to reach out to office if new, worrisome, or unresolved symptoms arise or if no improvement in patient's condition. Patient verbalized understanding and is agreeable to treatment plan. All questions answered to patient's satisfaction.   Of note, portions of this note may have been created with voice recognition software  Physicist, medical). While this note has been edited for accuracy, occasional wrong-word or 'sound-a-like' substitutions may have occurred due to the inherent limitations of voice recognition software.  Yolanda Manges, FNP

## 2023-02-05 ENCOUNTER — Ambulatory Visit (HOSPITAL_BASED_OUTPATIENT_CLINIC_OR_DEPARTMENT_OTHER): Payer: MEDICAID | Admitting: Orthopaedic Surgery

## 2023-02-05 DIAGNOSIS — M778 Other enthesopathies, not elsewhere classified: Secondary | ICD-10-CM

## 2023-02-05 DIAGNOSIS — M25531 Pain in right wrist: Secondary | ICD-10-CM

## 2023-02-05 NOTE — Progress Notes (Signed)
Chief Complaint: Right wrist pain     History of Present Illness:    Clinton Fisher is a 32 y.o. male presents with right wrist pain now for several weeks after chopping down a tree.  Since this time he has had pain with extension of the wrist.  He is experiencing pain on the ulnar side of the wrist.  He did attempt to wear a brace although limiting his motion was actually making his wrist pain or aggravating.  He has trialed steroid without any relief.  He has trialed anti-inflammatories as well as activity restriction none of which are giving him relief.  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    Surgical History:   None  PMH/PSH/Family History/Social History/Meds/Allergies:    Past Medical History:  Diagnosis Date  . Abrasion of index finger 07/29/2013   left  . 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 Fissure, anal 03/18/2021  . GERD (gastroesophageal reflux disease)    no current med.  . Manic affective disorder with recurrent episode (HCC)   . Seasonal allergies    sore throat 07/29/2013  . Skin problem 12/07/2021   Past Surgical History:  Procedure Laterality Date  . FOOT FOREIGN BODY REMOVAL Right 01/19/2011  . KNEE ARTHROSCOPY Right 08/01/2013   Procedure: ARTHROSCOPY RIGHT KNEE, Chondroplasty, Excision of Plica;  Surgeon: Harvie Junior, MD;  Location: [facility];  Service: Orthopedics;  Laterality: Right;  . SCROTAL EXPLORATION N/A 11/09/2022   Procedure: SCROTUM EXPLORATION-excision of scrotal sebaceous cyst;  Surgeon: Malen Gauze, MD;  Location: [facility];  Service: Urology;  Laterality: N/A;   Social History   Socioeconomic History  . Marital status: Single    Spouse name: Not on file  . Number of children: 1  . Years of education: Not on file  . Highest education level: Not on file  Occupational History  . Not on file  Tobacco Use  . Smoking status: Every  Day    Types: E-cigarettes  . Smokeless tobacco: Former  Advertising account planner  . Vaping status: Every Day  Substance and Sexual Activity  . Alcohol use: Yes    Comment: every 3 days  . Drug use: No  . Sexual activity: Not Currently  Other Topics Concern  . Not on file  Social History Narrative  . Not on file   Social Determinants of Health   Financial Resource Strain: Not on file  Food Insecurity: Not on file  Transportation Needs: Not on file  Physical Activity: Not on file  Stress: Not on file  Social Connections: Unknown (11/29/2021)   Received from [facility]   Social Network   . Social Network: Not on file   Family History  Problem Relation Age of Onset  . Depression Mother   . Colon polyps Mother    Allergies  Allergen Reactions  . Trazodone And Nefazodone Other (See Comments)    priapism  . Amoxicillin Diarrhea  . Amoxicillin-Pot Clavulanate Diarrhea    Diarrhea   . Clavulanic Acid Other (See Comments)    Unknown   Current Outpatient Medications  Medication Sig Dispense Refill  . acetaminophen (TYLENOL) 500 MG tablet Take 500 mg by mouth every 6 (six) hours as needed  for mild pain or moderate pain.    . clobetasol (TEMOVATE) 0.05 % external solution Apply 1 Application topically as needed (apply to scalp as needed). (Patient not taking: Reported on 01/24/2023)    . doxycycline (VIBRAMYCIN) 100 MG capsule Take 100 mg by mouth 2 (two) times daily.    . fluticasone (CUTIVATE) 0.05 % cream Apply 1 Application topically as needed (apply as needed to face).    . predniSONE (STERAPRED UNI-PAK 21 TAB) 10 MG (21) TBPK tablet Take tablets as directed by taper dose pack for 6 days. (Patient not taking: Reported on 01/31/2023) 1 each 0   No current facility-administered medications for this visit.   No results found.  Review of Systems:   A ROS was performed including pertinent positives and negatives as documented in the HPI.  Physical Exam :    Constitutional: NAD and appears stated age Neurological: Alert and oriented Psych: Appropriate affect and cooperative There were no vitals taken for this visit.   Comprehensive Musculoskeletal Exam:    Tenderness to palpation about the right ECU tendon particularly with extension.  This is going down the arm.  Remainder of neurosensory exam is intact  Imaging:   Xray (3 views right wrist): Normal    I personally reviewed and interpreted the radiographs.   Assessment:   32 y.o. male with right wrist pain consistent with ECU tendinitis after chopping down a tree.  Today's visit I recommended initial ultrasound-guided injection into the ECU tendon sheath.  I would also like him to participate in hand therapy for range of motion to progress his strength and activity.  He may progress as tolerated.  Plan :    -Right wrist ultrasound-guided and ECU injection performed after verbal consent obtained    Procedure Note  Patient: Clinton Fisher             Date of Birth: 01/25/1991           MRN: [id number]             Visit Date: 02/05/2023  Procedures: Visit Diagnoses: No diagnosis found.  Medium Joint Inj on 02/05/2023 11:23 AM Details: ultrasound-guided         I personally saw and evaluated the patient, and participated in the management and treatment plan.  Huel Cote, MD Attending Physician, Orthopedic Surgery  This document was dictated using Dragon voice recognition software. A reasonable attempt at proof reading has been made to minimize errors.

## 2023-02-05 NOTE — Addendum Note (Signed)
Addended by: Benancio Deeds on: 02/05/2023 11:30 AM   Modules accepted: Level of Service

## 2023-02-19 ENCOUNTER — Telehealth (HOSPITAL_BASED_OUTPATIENT_CLINIC_OR_DEPARTMENT_OTHER): Payer: Self-pay | Admitting: Orthopaedic Surgery

## 2023-02-19 ENCOUNTER — Ambulatory Visit (INDEPENDENT_AMBULATORY_CARE_PROVIDER_SITE_OTHER): Payer: MEDICAID | Admitting: Family Medicine

## 2023-02-19 ENCOUNTER — Other Ambulatory Visit (HOSPITAL_BASED_OUTPATIENT_CLINIC_OR_DEPARTMENT_OTHER): Payer: Self-pay | Admitting: Orthopaedic Surgery

## 2023-02-19 ENCOUNTER — Encounter (HOSPITAL_BASED_OUTPATIENT_CLINIC_OR_DEPARTMENT_OTHER): Payer: Self-pay | Admitting: Family Medicine

## 2023-02-19 VITALS — BP 128/90 | HR 92 | Ht 69.0 in | Wt 214.8 lb

## 2023-02-19 DIAGNOSIS — S161XXA Strain of muscle, fascia and tendon at neck level, initial encounter: Secondary | ICD-10-CM | POA: Diagnosis not present

## 2023-02-19 DIAGNOSIS — T148XXA Other injury of unspecified body region, initial encounter: Secondary | ICD-10-CM

## 2023-02-19 DIAGNOSIS — M778 Other enthesopathies, not elsewhere classified: Secondary | ICD-10-CM

## 2023-02-19 MED ORDER — CYCLOBENZAPRINE HCL 10 MG PO TABS: ORAL_TABLET | ORAL | 0 refills | Status: DC

## 2023-02-19 NOTE — Patient Instructions (Signed)
Please use heating pad at least 2-3 x a day.   Use the muscle relaxant only when at home and do not use if driving. Perform exercises twice daily.

## 2023-02-19 NOTE — Telephone Encounter (Signed)
Referral placed to [facility] after confirming with [facility]

## 2023-02-19 NOTE — Telephone Encounter (Signed)
Patient wants to know if he can get a PT referral sent to [facility] he does not want to got to the other PT office where he was suppose to go. Best contact number for patient [phone]

## 2023-02-19 NOTE — Progress Notes (Signed)
Acute Care Office Visit  Subjective:   Clinton Fisher [birth year] 02/19/2023  Chief Complaint  Patient presents with   Foreign Body in Skin    Pt states he has a splinter on the bottom of his little toe on right foot. States he stepped on a dog bone while barefoot. Also thinks he pulled a muscle in his neck.    HPI: FOREIGN BODY CONCERN:  Patient states in the past week he stepped on a chewed dog bone while barefoot and has noticed a splinter to the plantar surface of his right foot. He attempted to remove it by himself without success. He states he is having pain with walking on the affected foot. Denies fever, drainage, bleeding or signs of infection.    NECK PAIN: Onset: 1 week. Location: Right side of neck and upper back    Pain is described as: Tightness  Radiating: No Aggravating factors: Turning of head    Alleviating factors: None  Treatments tried: None  History of repetitive motion:  no  History of overuse or hyperextension:  no  History of trauma or recent injury:  no    Symptoms Back Pain:  no  Numbness/tingling:  no  Weakness:  no  Red Flags Fever:  no  Headache:  no  Bowel/bladder dysfunction:  no     The following portions of the patient's history were reviewed and updated as appropriate: past medical history, past surgical history, family history, social history, allergies, medications, and problem list.   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   Manic affective disorder with recurrent episode (HCC)    Seasonal allergies    sore throat 07/29/2013   Skin problem 12/07/2021   Past Surgical History:  Procedure Laterality Date   FOOT FOREIGN BODY REMOVAL Right 01/19/2011   KNEE ARTHROSCOPY Right 08/01/2013   Procedure: ARTHROSCOPY RIGHT KNEE, Chondroplasty, Excision of Plica;  Surgeon: Harvie Junior, MD;  Location: [facility];  Service: Orthopedics;  Laterality: Right;   SCROTAL EXPLORATION N/A 11/09/2022   Procedure: SCROTUM EXPLORATION-excision of scrotal sebaceous cyst;  Surgeon: Malen Gauze, MD;  Location: [facility];  Service: Urology;  Laterality: N/A;   Family History  Problem Relation Age of Onset   Depression Mother    Colon polyps Mother    Outpatient Medications Prior to Visit  Medication Sig Dispense Refill   acetaminophen (TYLENOL) 500 MG tablet Take 500 mg by mouth every 6 (six) hours as needed for mild pain or moderate pain.     clobetasol (TEMOVATE) 0.05 % external solution Apply 1 Application topically as needed (apply as needed to scalp).     fluticasone (CUTIVATE) 0.05 % cream Apply 1 Application topically as needed (apply as needed to face).  clobetasol (TEMOVATE) 0.05 % external solution Apply 1 Application topically as needed (apply to scalp as needed). (Patient not taking: Reported on 01/24/2023)     doxycycline (VIBRAMYCIN) 100 MG capsule Take 100 mg by mouth 2 (two) times daily.     predniSONE (STERAPRED UNI-PAK 21 TAB) 10 MG (21) TBPK tablet Take tablets as directed by taper dose pack for 6 days. (Patient not taking: Reported on 01/31/2023) 1 each 0   No facility-administered medications prior to visit.   Allergies  Allergen Reactions   Trazodone And Nefazodone Other (See Comments)    priapism   Amoxicillin  Diarrhea   Amoxicillin-Pot Clavulanate Diarrhea    Diarrhea    Clavulanic Acid Other (See Comments)    Unknown     ROS: A complete ROS was performed with pertinent positives/negatives noted in the HPI. The remainder of the ROS are negative.    Objective:   Today's Vitals   02/19/23 0907 02/19/23 0932  BP: (!) 128/91 (!) 128/90  Pulse: 92   SpO2: 97%   Weight: 214 lb 12.8 oz (97.4 kg)   Height: 5\' 9"  (1.753 m)     GENERAL: Well-appearing, in NAD. Well nourished.  SKIN: Pink, warm and dry. No rash, lesion, ulceration, or ecchymoses. Small splinter present to right 5th metatarsal plantar surface with callous formation. No signs of erythema, drainage or bleeding.  Head: Normocephalic. NECK: Trachea midline. Limited rotation to left side and lateral rotation due to pain. No crepitus, deformity or step off to cervical spine.  No lymphadenopathy. Tight muscle banding to right trapezius muscle.  EYES: Conjunctiva clear without exudates. EOMI, PERRL, no drainage present.  RESPIRATORY: Chest wall symmetrical. Respirations even and non-labored. Breath sounds clear to auscultation bilaterally.  CARDIAC: S1, S2 present, regular rate and rhythm without murmur or gallops. Peripheral pulses 2+ bilaterally.  MSK: Muscle tone and strength appropriate for age. Joints w/o tenderness, redness, or swelling.  EXTREMITIES: Without clubbing, cyanosis, or edema.  NEUROLOGIC: No motor or sensory deficits. Steady, even gait. C2-C12 intact.  PSYCH/MENTAL STATUS: Alert, oriented x 3. Cooperative, appropriate mood and affect.   Foreign Body Removal  Date/Time: 02/19/2023 9:31 AM  Performed by: Hilbert Bible, FNP Authorized by: Hilbert Bible, FNP  Body area: skin General location: lower extremity Location details: right foot Patient cooperative: yes Localization method: visualized Removal mechanism: Forceps. Dressing: antibiotic ointment and dressing applied Tendon involvement:  none Depth: subcutaneous Complexity: simple 1 objects recovered. Objects recovered: Splinter Post-procedure assessment: foreign body removed Patient tolerance: patient tolerated the procedure well with no immediate complications       Assessment & Plan:  1. Neck strain, initial encounter Likely musculoskeletal strain without apparent trauma or injury cause. Will recommend application of heat, Tylenol, stretching and Flexeril 10mg  if needed for muscle spasm. Discussed safe use of this medication and patient verbalized understanding.   2. Splinter Splinter removed and patient verbalized relief of pain. Antibiotic cream and bandage applied. Wound care reviewed with patient.  - Foreign Body Removal   Meds ordered this encounter  Medications   cyclobenzaprine (FLEXERIL) 10 MG tablet    Sig: Take 1 tablet (10mg  total) by mouth at bedtime if needed for neck pain.    Dispense:  30 tablet    Refill:  0    Order Specific Question:   Supervising Provider    Answer:   DE Peru, RAYMOND J [[id number]]   Return if symptoms worsen or fail to improve.    Patient to reach out  to office if new, worrisome, or unresolved symptoms arise or if no improvement in patient's condition. Patient verbalized understanding and is agreeable to treatment plan. All questions answered to patient's satisfaction.   Of note, portions of this note may have been created with voice recognition software Physicist, medical). While this note has been edited for accuracy, occasional wrong-word or 'sound-a-like' substitutions may have occurred due to the inherent limitations of voice recognition software.  Yolanda Manges, FNP

## 2023-02-21 ENCOUNTER — Other Ambulatory Visit (HOSPITAL_BASED_OUTPATIENT_CLINIC_OR_DEPARTMENT_OTHER): Payer: MEDICAID

## 2023-02-21 DIAGNOSIS — Z0000 Encounter for general adult medical examination without abnormal findings: Secondary | ICD-10-CM

## 2023-02-26 ENCOUNTER — Ambulatory Visit
Admission: EM | Admit: 2023-02-26 | Discharge: 2023-02-26 | Disposition: A | Discharge status: Home / Self Care | Payer: MEDICAID | Attending: Family Medicine | Admitting: Family Medicine

## 2023-02-26 DIAGNOSIS — N4821 Abscess of corpus cavernosum and penis: Secondary | ICD-10-CM | POA: Diagnosis not present

## 2023-02-26 MED ORDER — SULFAMETHOXAZOLE-TRIMETHOPRIM 800-160 MG PO TABS: 1.0000 | ORAL_TABLET | Freq: Two times a day (BID) | ORAL | 0 refills | Status: DC

## 2023-02-26 NOTE — ED Provider Notes (Signed)
[facility]    CSN: [id number] Arrival date & time: 02/26/23  1111      History   Chief Complaint Chief Complaint  Patient presents with   Abscess    HPI Clinton Fisher is a 32 y.o. male.   Patient presenting today with 3-day history of painful abscess to left side of penis that occurred after he was tweezing the area.  He states he has a history of similar issues and has an appointment to follow-up with his urologist on 826, could not get a sooner appointment.  States he now feels like he is starting to have fevers with this and the pain is severe.  Denies drainage, bleeding, body aches, chills.  So far trying warm compresses with no relief.    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   Manic affective disorder with recurrent episode (HCC)    Seasonal allergies    sore throat 07/29/2013   Skin problem 12/07/2021    Patient Active Problem List   Diagnosis Date Noted   Scrotal sebaceous cyst 11/09/2022   Tendinitis of flexor tendon of both hands 07/19/2022   Acute left-sided low back pain without sciatica 07/19/2022   Hemorrhoids 02/25/2021   High risk medication use 02/25/2021   Chronic pain of right knee 01/11/2021   Encounter for medical examination to establish care 01/11/2021   Bipolar 1 disorder (HCC) 10/18/2018   Tobacco use disorder 08/20/2017   GAD (generalized anxiety disorder) 09/25/2016    Past Surgical History:  Procedure Laterality Date   FOOT FOREIGN BODY REMOVAL Right 01/19/2011   KNEE ARTHROSCOPY Right 08/01/2013   Procedure: ARTHROSCOPY RIGHT KNEE, Chondroplasty, Excision of Plica;  Surgeon: Harvie Junior, MD;  Location: [facility];  Service: Orthopedics;  Laterality: Right;   SCROTAL EXPLORATION N/A 11/09/2022   Procedure: SCROTUM  EXPLORATION-excision of scrotal sebaceous cyst;  Surgeon: Malen Gauze, MD;  Location: [facility];  Service: Urology;  Laterality: N/A;       Home Medications    Prior to Admission medications   Medication Sig Start Date End Date Taking? Authorizing Provider  acetaminophen (TYLENOL) 500 MG tablet Take 500 mg by mouth every 6 (six) hours as needed for mild pain or moderate pain.   Yes [provider]  clobetasol (TEMOVATE) 0.05 % external solution Apply 1 Application topically as needed (apply as needed to scalp).   Yes [provider]  fluticasone (CUTIVATE) 0.05 % cream Apply 1 Application topically as needed (apply as needed to face). 11/09/22  Yes [provider]  sulfamethoxazole-trimethoprim (BACTRIM DS) 800-160 MG tablet Take 1 tablet by mouth 2 (two) times daily. 02/26/23  Yes Particia Nearing, PA-C  cyclobenzaprine (FLEXERIL) 10 MG tablet Take 1 tablet (10mg  total) by mouth at bedtime if needed for neck pain. 02/19/23   Caudle, Shelton Silvas, FNP    Family History Family History  Problem Relation Age of Onset   Depression Mother    Colon polyps Mother     Social History Social History   Tobacco Use   Smoking status: Every Day    Types: E-cigarettes   Smokeless tobacco: Former  Building services engineer status: Every Day  Substance Use Topics   Alcohol use: Yes    Comment: every  3 days   Drug use: No     Allergies   Trazodone and nefazodone, Amoxicillin, Amoxicillin-pot clavulanate, and Clavulanic acid   Review of Systems Review of Systems PER HPI  Physical Exam Triage Vital Signs ED Triage Vitals  Encounter Vitals Group     BP 02/26/23 1231 (!) 130/93     Systolic BP Percentile --      Diastolic BP Percentile --      Pulse Rate 02/26/23 1231 92     Resp 02/26/23 1231 18     Temp 02/26/23 1231 98.9 F (37.2 C)     Temp Source 02/26/23 1231 Oral     SpO2 02/26/23 1231 94 %     Weight --      Height --      Head  Circumference --      Peak Flow --      Pain Score 02/26/23 1239 10     Pain Loc --      Pain Education --      Exclude from Growth Chart --    No data found.  Updated Vital Signs BP (!) 130/93 (BP Location: Right Arm)   Pulse 92   Temp 98.9 F (37.2 C) (Oral)   Resp 18   SpO2 94%   Visual Acuity Right Eye Distance:   Left Eye Distance:   Bilateral Distance:    Right Eye Near:   Left Eye Near:    Bilateral Near:     Physical Exam Vitals and nursing note reviewed. Exam conducted with a chaperone present.  Constitutional:      Appearance: Normal appearance.  HENT:     Head: Atraumatic.  Eyes:     Extraocular Movements: Extraocular movements intact.     Conjunctiva/sclera: Conjunctivae normal.  Cardiovascular:     Rate and Rhythm: Normal rate.  Pulmonary:     Effort: Pulmonary effort is normal.  Genitourinary:    Comments: 1 cm firm abscess to left shaft of penis Musculoskeletal:        General: Normal range of motion.     Cervical back: Normal range of motion and neck supple.  Skin:    General: Skin is warm and dry.     Findings: No erythema.  Neurological:     General: No focal deficit present.     Mental Status: He is oriented to person, place, and time.  Psychiatric:        Mood and Affect: Mood normal.        Thought Content: Thought content normal.        Judgment: Judgment normal.      UC Treatments / Results  Labs (all labs ordered are listed, but only abnormal results are displayed) Labs Reviewed - No data to display  EKG   Radiology No results found.  Procedures Procedures (including critical care time)  Medications Ordered in UC Medications - No data to display  Initial Impression / Assessment and Plan / UC Course  I have reviewed the triage vital signs and the nursing notes.  Pertinent labs & imaging results that were available during my care of the patient were reviewed by me and considered in my medical decision making (see  chart for details).     Area is firm, nonfluctuant with no induration.  Will forego I&D today with shared decision making.  Will start a course of antibiotics, warm compresses and close follow-up with PCP and urologist as scheduled.  Return for worsening symptoms in  the meantime.  Final Clinical Impressions(s) / UC Diagnoses   Final diagnoses:  Penile abscess   Discharge Instructions   None    ED Prescriptions     Medication Sig Dispense Auth. Provider   sulfamethoxazole-trimethoprim (BACTRIM DS) 800-160 MG tablet Take 1 tablet by mouth 2 (two) times daily. 20 tablet Particia Nearing, [place]      PDMP not reviewed this encounter.   Particia Nearing, [place] 02/26/23 1414

## 2023-02-26 NOTE — ED Triage Notes (Signed)
Pt presents with possible abscess in groin/ genitalia. Pt states it is red and inflamed. Pt does have appointment on 8/26 to see Dr. Lafonda Mosses urologist to have it drained and surgery if needed.

## 2023-02-28 ENCOUNTER — Ambulatory Visit (INDEPENDENT_AMBULATORY_CARE_PROVIDER_SITE_OTHER): Payer: MEDICAID | Admitting: Family Medicine

## 2023-02-28 ENCOUNTER — Encounter (HOSPITAL_BASED_OUTPATIENT_CLINIC_OR_DEPARTMENT_OTHER): Payer: Self-pay | Admitting: Family Medicine

## 2023-02-28 VITALS — BP 131/86 | HR 79 | Ht 69.0 in | Wt 214.0 lb

## 2023-02-28 DIAGNOSIS — N4821 Abscess of corpus cavernosum and penis: Secondary | ICD-10-CM | POA: Diagnosis not present

## 2023-02-28 DIAGNOSIS — T148XXA Other injury of unspecified body region, initial encounter: Secondary | ICD-10-CM | POA: Diagnosis not present

## 2023-02-28 DIAGNOSIS — E6609 Other obesity due to excess calories: Secondary | ICD-10-CM

## 2023-02-28 DIAGNOSIS — Z0000 Encounter for general adult medical examination without abnormal findings: Secondary | ICD-10-CM | POA: Diagnosis not present

## 2023-02-28 DIAGNOSIS — Z6831 Body mass index (BMI) 31.0-31.9, adult: Secondary | ICD-10-CM

## 2023-02-28 NOTE — Progress Notes (Signed)
Subjective:   Clinton Fisher Aug 26, 1990 02/28/2023  Chief Complaint  Patient presents with   Medical Management of Chronic Issues    Pt is here for a follow up for mood. States he wants to have a cyst checked out and also states he wants to have his foot looked at where he potentially had the splinter.    HPI: Clinton Fisher is a 32 y.o. male who presents for a routine health maintenance exam.  Labs collected at time of visit.    ABSCESS: Pt has cyst to side of penis and went to UC on 02/26/23. He was started on Bactrim and reports relief of drainage, swelling and pain at this time afer 2 days of abx therapy. He has been using epsom salt soaks in the bath as well. He has appt for Urology on 03/19/23. He recently had a scrotal cyst excised on 11/19/22 by Urology.  Denies fever. Would like PCP to look at site to assess for healing.    SPLINTER CONCERN:  Splinter was removed from plantar surface of right foot in last visit. Pt states he continued to have mild sharp pains with walking. States he has been having intermittent sharp pains. He attempted to "dig in" to area of concern to right foot and pulled out "something brown". Denies drainage, bleeding or swelling.    HEALTH SCREENINGS: - Vision Screening: up to date - Dental Visits: up to date - Testicular Exam:  Will assess per patient request - STD Screening: Declined - PSA (50+): Not applicable  No results found for: "PSA1", "PSA"   - Colonoscopy (45+): Not applicable  Discussed with patient purpose of the colonoscopy is to detect colon cancer at curable precancerous or early stages  - AAA Screening: Not applicable  Men age 29-75 who have ever smoked - Lung Cancer screening with low-dose CT: Not applicable-  Adults age 29-80 who are current cigarette smokers or quit within the last 15 years. Must have 20 pack year history.   Depression and Anxiety Screen done today and results listed below:     02/19/2023    9:11 AM  01/31/2023   10:36 AM 01/24/2023   10:37 AM 12/02/2021   10:30 AM 01/11/2021   11:04 AM  Depression screen PHQ 2/9  Decreased Interest 0 0 0 0 0  Down, Depressed, Hopeless 0 0 0 0 2  PHQ - 2 Score 0 0 0 0 2  Altered sleeping 0 0 0  1  Tired, decreased energy 0 0 0  1  Change in appetite 0 0 0  1  Feeling bad or failure about yourself  0 0 0  1  Trouble concentrating 0 0 0  1  Moving slowly or fidgety/restless 0 0 0  3  Suicidal thoughts 0 0 0  0  PHQ-9 Score 0 0 0  10  Difficult doing work/chores Not difficult at all Not difficult at all Not difficult at all  Very difficult      02/19/2023    9:11 AM 01/31/2023   10:38 AM 01/24/2023   10:38 AM 01/11/2021   11:04 AM  GAD 7 : Generalized Anxiety Score  Nervous, Anxious, on Edge 0 0 0 3  Control/stop worrying 0 0 0 1  Worry too much - different things 0 0 0 3  Trouble relaxing 0 0 0 1  Restless 0 0 0 3  Easily annoyed or irritable 0 0 0 3  Afraid - awful might happen  0 0 0 1  Total GAD 7 Score 0 0 0 15  Anxiety Difficulty Not difficult at all Not difficult at all Not difficult at all Extremely difficult    IMMUNIZATIONS:  - Tdap: Tetanus vaccination status reviewed: last tetanus booster within 10 years. - Influenza: Postponed to flu season - Pneumovax: Not applicable - Prevnar: Not applicable - Zostavax vaccine (50+): Not applicable   Past medical history, surgical history, medications, allergies, family history and social history reviewed with patient today and changes made to appropriate areas of the chart.   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   Manic affective disorder with recurrent episode (HCC)    Seasonal allergies    sore throat 07/29/2013   Skin problem 12/07/2021    Past Surgical History:  Procedure Laterality Date   FOOT  FOREIGN BODY REMOVAL Right 01/19/2011   KNEE ARTHROSCOPY Right 08/01/2013   Procedure: ARTHROSCOPY RIGHT KNEE, Chondroplasty, Excision of Plica;  Surgeon: Harvie Junior, MD;  Location: [facility];  Service: Orthopedics;  Laterality: Right;   SCROTAL EXPLORATION N/A 11/09/2022   Procedure: SCROTUM EXPLORATION-excision of scrotal sebaceous cyst;  Surgeon: Malen Gauze, MD;  Location: [facility];  Service: Urology;  Laterality: N/A;    Current Outpatient Medications on File Prior to Visit  Medication Sig   acetaminophen (TYLENOL) 500 MG tablet Take 500 mg by mouth every 6 (six) hours as needed for mild pain or moderate pain.   clobetasol (TEMOVATE) 0.05 % external solution Apply 1 Application topically as needed (apply as needed to scalp).   cyclobenzaprine (FLEXERIL) 10 MG tablet Take 1 tablet (10mg  total) by mouth at bedtime if needed for neck pain.   fluticasone (CUTIVATE) 0.05 % cream Apply 1 Application topically as needed (apply as needed to face).   sulfamethoxazole-trimethoprim (BACTRIM DS) 800-160 MG tablet Take 1 tablet by mouth 2 (two) times daily.   No current facility-administered medications on file prior to visit.    Allergies  Allergen Reactions   Trazodone And Nefazodone Other (See Comments)    priapism   Amoxicillin Diarrhea   Amoxicillin-Pot Clavulanate Diarrhea    Diarrhea    Clavulanic Acid Other (See Comments)    Unknown     Social History   Socioeconomic History   Marital status: Single    Spouse name: Not on file   Number of children: 1   Years of education: Not on file   Highest education level: Not on file  Occupational History   Not on file  Tobacco Use   Smoking status: Every Day    Types: E-cigarettes   Smokeless tobacco: Former  Advertising account planner   Vaping status: Every Day  Substance and Sexual Activity   Alcohol use: Yes    Comment: every 3 days   Drug use: No   Sexual activity: Not Currently  Other Topics Concern   Not on file   Social History Narrative   Not on file   Social Determinants of Health   Financial Resource Strain: Not on file  Food Insecurity: Not on file  Transportation Needs: Not on file  Physical Activity: Not on file  Stress: Not on file  Social Connections: Unknown (11/29/2021)   [facility], [facility]   Social Network  Social Network: Not on file  Intimate Partner Violence: Unknown (10/26/2021)   [facility], [facility]   HITS    Physically Hurt: Not on file    Insult or Talk Down To: Not on file    Threaten Physical Harm: Not on file    Scream or Curse: Not on file   Social History   Tobacco Use  Smoking Status Every Day   Types: E-cigarettes  Smokeless Tobacco Former   Social History   Substance and Sexual Activity  Alcohol Use Yes   Comment: every 3 days     Family History  Problem Relation Age of Onset   Depression Mother    Colon polyps Mother      ROS: Denies fever, fatigue, unexplained weight loss/gain, CP, SHOB, and palpatitations. Denies neurological deficits, gastrointestinal and/or genitourinary complaints, and skin changes.   Objective:   Today's Vitals   02/28/23 0810 02/28/23 0834  BP: (!) 135/93 131/86  Pulse: 79   SpO2: 97%   Weight: 214 lb (97.1 kg)   Height: 5\' 9"  (1.753 m)     GENERAL APPEARANCE: Well-appearing, in NAD. Well nourished.  SKIN: Pink, warm and dry. Turgor normal. No rash, lesion, ulceration, or ecchymoses. Hair evenly distributed. Puncture wound present to plantar surface of right foot. No signs of redness, drainage, bleeding, or swelling.  HEENT: HEAD: Normocephalic.  EYES: PERRLA. EOMI. Lids intact w/o defect. Sclera white, Conjunctiva pink w/o exudate.  EARS: External ear w/o redness, swelling, masses or lesions. EAC clear. TM's intact, translucent w/o bulging, appropriate landmarks visualized. Appropriate acuity to conversational tones.  NOSE: Septum midline w/o deformity. Nares  patent, mucosa pink and non-inflamed w/o drainage. No sinus tenderness.  THROAT: Uvula midline. Oropharynx clear. Tonsils non-inflamed w/o exudate. Oral mucosa pink and moist.  NECK: Supple, Trachea midline. Full ROM w/o pain or tenderness. No lymphadenopathy. Thyroid non-tender w/o enlargement or palpable masses.  RESPIRATORY: Chest wall symmetrical w/o masses. Respirations even and non-labored. Breath sounds clear to auscultation bilaterally. No wheezes, rales, rhonchi, or crackles. CARDIAC: S1, S2 present, regular rate and rhythm. No gallops, murmurs, rubs, or clicks. PMI w/o lifts, heaves, or thrills. No carotid bruits. Capillary refill <2 seconds. Peripheral pulses 2+ bilaterally. GI: Abdomen soft w/o distention. Normoactive bowel sounds. No palpable masses or tenderness. No guarding or rebound tenderness. Liver and spleen w/o tenderness or enlargement. No CVA tenderness.  GU: Circumcised. No penile discharge or lesions. No scrotal swelling or discoloration. Testes descended bilaterally. Epididymis non-tender. Palpable abscess present to left midshaft of penis. No erythema, no drainage or bleeding.  No palpable inguinal or femoral hernias.   Chaperoned by: Cristy Hilts, CMA  MSK: Muscle tone and strength appropriate for age, w/o atrophy or abnormal movement. EXTREMITIES: Active ROM intact, w/o tenderness, crepitus, or contracture. No obvious joint deformities or effusions. No clubbing, edema, or cyanosis.  NEUROLOGIC: CN's II-XII intact. Motor strength symmetrical with no obvious weakness. No sensory deficits. DTR 2+ symmetric bilaterally. Steady, even gait.  PSYCH/MENTAL STATUS: Alert, oriented x 3. Cooperative, appropriate mood and affect.   Results for orders placed or performed in visit on 02/21/23  Hemoglobin A1c  Result Value Ref Range   Hgb A1c MFr Bld 5.5 4.8 - 5.6 %   Est. average glucose Bld gHb Est-mCnc 111 mg/dL  CBC with Differential/Platelet  Result Value Ref Range   WBC 7.6  3.4 - 10.8 x10E3/uL   RBC 5.42 4.14 - 5.80 x10E6/uL   Hemoglobin 16.2 13.0 - 17.7 g/dL  Hematocrit 48.4 37.5 - 51.0 %   MCV 89 79 - 97 fL   MCH 29.9 26.6 - 33.0 pg   MCHC 33.5 31.5 - 35.7 g/dL   RDW 40.3 47.4 - 25.9 %   Platelets 316 150 - 450 x10E3/uL   Neutrophils 64 Not Estab. %   Lymphs 25 Not Estab. %   Monocytes 9 Not Estab. %   Eos 2 Not Estab. %   Basos 0 Not Estab. %   Neutrophils Absolute 4.8 1.4 - 7.0 x10E3/uL   Lymphocytes Absolute 1.9 0.7 - 3.1 x10E3/uL   Monocytes Absolute 0.7 0.1 - 0.9 x10E3/uL   EOS (ABSOLUTE) 0.1 0.0 - 0.4 x10E3/uL   Basophils Absolute 0.0 0.0 - 0.2 x10E3/uL   Immature Granulocytes 0 Not Estab. %   Immature Grans (Abs) 0.0 0.0 - 0.1 x10E3/uL  Comprehensive metabolic panel  Result Value Ref Range   Glucose 90 70 - 99 mg/dL   BUN 18 6 - 20 mg/dL   Creatinine, Ser 5.63 0.76 - 1.27 mg/dL   eGFR 875 >64 PP/IRJ/1.88   BUN/Creatinine Ratio 20 9 - 20   Sodium 140 134 - 144 mmol/L   Potassium 4.5 3.5 - 5.2 mmol/L   Chloride 104 96 - 106 mmol/L   CO2 23 20 - 29 mmol/L   Calcium 9.2 8.7 - 10.2 mg/dL   Total Protein 7.4 6.0 - 8.5 g/dL   Albumin 4.8 4.1 - 5.1 g/dL   Globulin, Total 2.6 1.5 - 4.5 g/dL   Bilirubin Total 0.3 0.0 - 1.2 mg/dL   Alkaline Phosphatase 98 44 - 121 IU/L   AST 16 0 - 40 IU/L   ALT 22 0 - 44 IU/L  TSH  Result Value Ref Range   TSH 0.821 0.450 - 4.500 uIU/mL  Lipid panel  Result Value Ref Range   Cholesterol, Total 154 100 - 199 mg/dL   Triglycerides 416 0 - 149 mg/dL   HDL 44 >60 mg/dL   VLDL Cholesterol Cal 23 5 - 40 mg/dL   LDL Chol Calc (NIH) 87 0 - 99 mg/dL   Chol/HDL Ratio 3.5 0.0 - 5.0 ratio    Assessment & Plan:  1. Annual physical exam Doing well overall. Discussed healthy diet and exercise to promote healthy lifestyle. Lab results discussed in detail with patient and preventative screenings, immunizations are up to date.   2. Penile abscess Currently improved with Bactrim from Urgent Care. Pt to complete  abx and keep appt with urology as scheduled. Warm compresses and epsom soaks recommended. Reach out to PCP or Urology if worsening prior to follow up.   3. Splinter No splinter or foreign object, erythema or drainage seen. Recommend drawing salve, antibiotic ointment and follow up with Podiatry if needed.   4. Class 1 obesity due to excess calories without serious comorbidity with body mass index (BMI) of 31.0 to 31.9 in adult Healthy diet and exercise changes recommended and discussed with patient. Follow up in 6 months.     PATIENT COUNSELING: - Encouraged to adjust caloric intake to maintain or achieve ideal body weight, to reduce intake of dietary saturated fat and total fat, to limit sodium intake by avoiding high sodium foods and not adding table salt, and to maintain adequate dietary potassium and calcium preferably from fresh fruits, vegetables, and low-fat dairy products.   - Advised to avoid cigarette smoking. - Discussed with the patient that most people either abstain from alcohol or drink within safe limits (<=14/week and <=4  drinks/occasion for males, <=7/weeks and <= 3 drinks/occasion for females) and that the risk for alcohol disorders and other health effects rises proportionally with the number of drinks per week and how often a drinker exceeds daily limits. - Discussed cessation/primary prevention of drug use and availability of treatment for abuse.   - Stressed the importance of regular exercise - Injury prevention: Discussed safety belts, safety helmets, smoke detector, smoking near bedding or upholstery.  - Dental health: Discussed importance of regular tooth brushing, flossing, and dental visits.  - Sexuality: Discussed sexually transmitted diseases, partner selection, use of condoms, avoidance of unintended pregnancy  and contraceptive alternatives.   NEXT PREVENTATIVE PHYSICAL DUE IN 1 YEAR.  Return in about 6 months (around 08/31/2023) for Bp and weight follow up (A1C  at visit) .  Patient to reach out to office if new, worrisome, or unresolved symptoms arise or if no improvement in patient's condition. Patient verbalized understanding and is agreeable to treatment plan. All questions answered to patient's satisfaction.    Yolanda Manges, FNP

## 2023-02-28 NOTE — Patient Instructions (Addendum)
[employer] and antibiotic cream to foot   Follow up with Podiatry ([facility]) if no improvement.    Warm compresses to abscess on penis 2-3x a day or warm epsom salt soaks.

## 2023-03-09 ENCOUNTER — Telehealth: Payer: Self-pay | Admitting: Family Medicine

## 2023-03-09 NOTE — Telephone Encounter (Signed)
Pt reports that he does not want a PCP either in [place] or [place], due to INS he has to be in the network and he has had bad experiences with doctors in both [place] and [place], Pt is happy with the referral he got and waiting for approval from INS about the PCP he has picked in [place], Pt was very open and said that now that he knows that in the ED they don't do referrals for his needs he want to establish a PCP.

## 2023-03-09 NOTE — Telephone Encounter (Signed)
Not able to complete Call Pt's last visit to ED was 02/26/2023  Pt has PCP in [place], will ask pt if he wants local PCP

## 2023-03-19 ENCOUNTER — Other Ambulatory Visit: Payer: Self-pay | Admitting: Medical Genetics

## 2023-03-19 DIAGNOSIS — Z006 Encounter for examination for normal comparison and control in clinical research program: Secondary | ICD-10-CM

## 2023-03-21 ENCOUNTER — Other Ambulatory Visit (HOSPITAL_COMMUNITY)
Admission: AD | Admit: 2023-03-21 | Discharge: 2023-03-21 | Disposition: A | Discharge status: Home / Self Care | Payer: MEDICAID | Source: Ambulatory Visit | Attending: Oncology | Admitting: Oncology

## 2023-03-21 DIAGNOSIS — Z006 Encounter for examination for normal comparison and control in clinical research program: Secondary | ICD-10-CM | POA: Insufficient documentation

## 2023-03-22 ENCOUNTER — Ambulatory Visit (HOSPITAL_BASED_OUTPATIENT_CLINIC_OR_DEPARTMENT_OTHER): Payer: MEDICAID | Attending: Orthopaedic Surgery | Admitting: Physical Therapy

## 2023-03-22 ENCOUNTER — Encounter (HOSPITAL_BASED_OUTPATIENT_CLINIC_OR_DEPARTMENT_OTHER): Payer: Self-pay | Admitting: Physical Therapy

## 2023-03-22 DIAGNOSIS — M778 Other enthesopathies, not elsewhere classified: Secondary | ICD-10-CM | POA: Insufficient documentation

## 2023-03-22 DIAGNOSIS — R29898 Other symptoms and signs involving the musculoskeletal system: Secondary | ICD-10-CM | POA: Diagnosis present

## 2023-03-22 NOTE — Therapy (Signed)
OUTPATIENT PHYSICAL THERAPY Upper extremity EVALUATION   Patient Name: Clinton Fisher MRN: [id number] DOB:Jan 26, 1991, 32 y.o., male Today's Date: 03/22/2023  END OF SESSION:  PT End of Session - 03/22/23 1150     Visit Number 1    Number of Visits 1    Date for PT Re-Evaluation 03/22/23    Authorization Type [employer]    PT Start Time 1122    PT Stop Time 1150    PT Time Calculation (min) 28 min    Activity Tolerance Patient tolerated treatment well    Behavior During Therapy [facility] for tasks assessed/performed             Past Medical History:  Diagnosis Date   Abrasion of index finger 07/29/2013   left   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   Manic affective disorder with recurrent episode (HCC)    Seasonal allergies    sore throat 07/29/2013   Skin problem 12/07/2021   Past Surgical History:  Procedure Laterality Date   FOOT FOREIGN BODY REMOVAL Right 01/19/2011   KNEE ARTHROSCOPY Right 08/01/2013   Procedure: ARTHROSCOPY RIGHT KNEE, Chondroplasty, Excision of Plica;  Surgeon: Harvie Junior, MD;  Location: [facility];  Service: Orthopedics;  Laterality: Right;   SCROTAL EXPLORATION N/A 11/09/2022   Procedure: SCROTUM EXPLORATION-excision of scrotal sebaceous cyst;  Surgeon: Malen Gauze, MD;  Location: [facility];  Service: Urology;  Laterality: N/A;   Patient Active Problem List   Diagnosis Date Noted   Class 1 obesity due to excess calories without serious comorbidity with body mass index (BMI) of 31.0 to 31.9 in adult 02/28/2023   Scrotal sebaceous cyst 11/09/2022   Tendinitis of flexor tendon of both hands 07/19/2022   Acute left-sided low back pain without sciatica 07/19/2022   Hemorrhoids 02/25/2021   High risk medication use 02/25/2021   Chronic pain of right knee 01/11/2021   Encounter for medical examination to establish care 01/11/2021    Bipolar 1 disorder (HCC) 10/18/2018   Tobacco use disorder 08/20/2017   GAD (generalized anxiety disorder) 09/25/2016    PCP: Hilbert Bible FNP  REFERRING PROVIDER: Huel Cote, MD  REFERRING DIAG: M77.8 (ICD-10-CM) - Wrist tendonitis  THERAPY DIAG:  Wrist tendonitis  Other symptoms and signs involving the musculoskeletal system  Rationale for Evaluation and Treatment: Rehabilitation  ONSET DATE: June 2024  SUBJECTIVE:  SUBJECTIVE STATEMENT: Patient stated right wrist pain now for after chopping down a tree. Since this time he has had pain with extension of the wrist. Patient states decreased symptoms following cortisone shot. Been intermittent over last week.   Hand dominance: Right  PERTINENT HISTORY: N/a  PAIN:  Are you having pain? Yes: NPRS scale: 2-3/10 Pain location: R wrist Pain description: burning/ sharp Aggravating factors: unsure Relieving factors: cortisone shot, ice  PRECAUTIONS: None  RED FLAGS: None   WEIGHT BEARING RESTRICTIONS: No  FALLS:  Has patient fallen in last 6 months? 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 There is no evidence of arthropathy or other focal bone abnormality. Soft tissues are unremarkable.  PATIENT SURVEYS:  FOTO not completed -  one time visit  COGNITION: Overall cognitive status: Within functional limits for tasks assessed     SENSATION: WFL  POSTURE: rounded shoulders and forward head   UPPER EXTREMITY ROM: WFL, no symptoms AROM/PROM  Active ROM Right eval Left eval   Shoulder flexion    Shoulder extension    Shoulder abduction    Shoulder adduction    Shoulder internal rotation    Shoulder external rotation    Elbow flexion    Elbow extension    Wrist flexion    Wrist extension    Wrist ulnar deviation    Wrist radial deviation    Wrist pronation    Wrist supination    (Blank rows = not tested) *=pain/symptoms  UPPER EXTREMITY MMT:  MMT Right eval Left eval  Shoulder flexion 5 5  Shoulder extension    Shoulder abduction 5 5  Shoulder adduction    Shoulder internal rotation    Shoulder external rotation    Middle trapezius    Lower trapezius    Elbow flexion 5 5  Elbow extension 5 5  Wrist flexion 5 5  Wrist extension 5 5  Wrist ulnar deviation 5 5  Wrist radial deviation 5 5  Wrist pronation    Wrist supination    Grip strength (lbs)    (Blank rows = not tested) *=pain/symptoms    JOINT MOBILITY TESTING:  Wrist ulnar PA/AP distal hypomobile bilaterally  PALPATION:  Slight TTP R distal ulna/radius   TODAY'S TREATMENT:                                                                                                                                         DATE:  03/22/23 Eval and HEP  PATIENT EDUCATION:  Education details: Patient educated on exam findings, POC, scope of PT, HEP, exercise mechanics Person educated: Patient Education method: Programmer, multimedia, Demonstration, and Handouts Education comprehension: verbalized understanding, returned demonstration, verbal cues required, and tactile  cues required  HOME EXERCISE PROGRAM: Access Code: [email or web address] URL: [email or web address] Date: 03/22/2023 - Isometric Wrist Extension Pronated  - 2 x daily - 7 x weekly - 5 reps - 10 second hold - Seated Isometric Wrist Flexion Supinated with Manual Resistance  - 2 x daily - 7 x weekly - 5 reps - 10 second hold - Seated Isometric Wrist Radial Deviation with Manual Resistance  - 2 x daily - 7 x weekly - 5 reps - 10 second  hold - Seated Isometric Wrist Ulnar Deviation with Manual Resistance  - 1 x daily - 7 x weekly - 5 reps - 10 second hold - Seated Wrist Flexion and Extension with Towel Twist  - 2 x daily - 7 x weekly - 2 sets - 10 reps - 5 second hold  ASSESSMENT:  CLINICAL IMPRESSION: Patient a 32 y.o. y.o. male who was seen today for physical therapy evaluation and treatment for R wrist pain. Patient with some tenderness at distal aspect of ulna with mild hypermobility. Patent overall with good strength and ROM without c/o pain with testing. Provided patient with HEP and he is agreeable to work on HEP and return to PT if needed. Patient does not require additional PT services at this time.   OBJECTIVE IMPAIRMENTS: decreased activity tolerance, decreased mobility, decreased strength, and pain.   ACTIVITY LIMITATIONS: lifting, bending, and yard work  PARTICIPATION LIMITATIONS: occupation and yard work  PERSONAL FACTORS: Time since onset of injury/illness/exacerbation are also affecting patient's functional outcome.   REHAB POTENTIAL: Good  CLINICAL DECISION MAKING: Stable/uncomplicated  EVALUATION COMPLEXITY: Low   GOALS: Goals reviewed with patient? Yes  SHORT TERM GOALS: Target date: 03/22/23  Patient will be educated on HEP Baseline: Goal status: MET    PLAN:  PT FREQUENCY: one time visit  PT DURATION:  1 sessions  PLANNED INTERVENTIONS: Therapeutic exercises, Therapeutic activity, Neuromuscular re-education, Balance training, Gait training, Patient/Family education, Joint manipulation, Joint mobilization, Stair training, Orthotic/Fit training, DME instructions, Aquatic Therapy, Dry Needling, Electrical stimulation, Spinal manipulation, Spinal mobilization, Cryotherapy, Moist heat, Compression bandaging, scar mobilization, Splintting, Taping, Traction, Ultrasound, Ionotophoresis 4mg /ml Dexamethasone, and Manual therapy   PLAN FOR NEXT SESSION: Virgel Paling, PT 03/22/2023,  12:53 PM

## 2023-03-31 LAB — GENECONNECT MOLECULAR SCREEN: Genetic Analysis Overall Interpretation: NEGATIVE

## 2023-03-31 LAB — HELIX MOLECULAR SCREEN

## 2023-04-11 ENCOUNTER — Encounter (HOSPITAL_BASED_OUTPATIENT_CLINIC_OR_DEPARTMENT_OTHER): Payer: Self-pay

## 2023-04-11 ENCOUNTER — Emergency Department (HOSPITAL_BASED_OUTPATIENT_CLINIC_OR_DEPARTMENT_OTHER)
Admission: EM | Admit: 2023-04-11 | Discharge: 2023-04-11 | Disposition: A | Discharge status: Home / Self Care | Payer: MEDICAID | Attending: Emergency Medicine | Admitting: Emergency Medicine

## 2023-04-11 ENCOUNTER — Other Ambulatory Visit: Payer: Self-pay

## 2023-04-11 DIAGNOSIS — R109 Unspecified abdominal pain: Secondary | ICD-10-CM | POA: Diagnosis present

## 2023-04-11 DIAGNOSIS — K625 Hemorrhage of anus and rectum: Secondary | ICD-10-CM | POA: Diagnosis not present

## 2023-04-11 DIAGNOSIS — R42 Dizziness and giddiness: Secondary | ICD-10-CM | POA: Insufficient documentation

## 2023-04-11 LAB — BASIC METABOLIC PANEL WITH GFR
Anion gap: 9 (ref 5–15)
BUN: 14 mg/dL (ref 6–20)
CO2: 22 mmol/L (ref 22–32)
Calcium: 8.8 mg/dL — ABNORMAL LOW (ref 8.9–10.3)
Chloride: 105 mmol/L (ref 98–111)
Creatinine, Ser: 0.75 mg/dL (ref 0.61–1.24)
GFR, Estimated: >60 mL/min (ref >60)
Glucose, Bld: 90 mg/dL (ref 70–99)
Potassium: 3.7 mmol/L (ref 3.5–5.1)
Sodium: 136 mmol/L (ref 135–145)

## 2023-04-11 LAB — CBC
HCT: 48.3 % (ref 39.0–52.0)
Hemoglobin: 16.5 g/dL (ref 13.0–17.0)
MCH: 30.1 pg (ref 26.0–34.0)
MCHC: 34.2 g/dL (ref 30.0–36.0)
MCV: 88 fL (ref 80.0–100.0)
Platelets: 272 10*3/uL (ref 150–400)
RBC: 5.49 MIL/uL (ref 4.22–5.81)
RDW: 12.2 % (ref 11.5–15.5)
WBC: 6.4 10*3/uL (ref 4.0–10.5)
nRBC: 0 % (ref 0.0–0.2)

## 2023-04-11 NOTE — ED Provider Notes (Signed)
[place] EMERGENCY DEPARTMENT AT [facility] Provider Note   CSN: [id number] Arrival date & time: 04/11/23  1341     History Chief Complaint  Patient presents with   Dizziness    Hematochezia    HPI Clinton Fisher is a 32 y.o. male presenting for BRBPR. Clinical Course as of 04/11/23 1640  Wed Apr 11, 2023  1628 -Abdominal cramping -Lightheadedness -BRBPR this morning -HX of GERD/Hem -Denies FCNVD [CC]    Clinical Course User Index [CC] Glyn Ade, MD   Patient's recorded medical, surgical, social, medication list and allergies were reviewed in the Snapshot window as part of the initial history.   Review of Systems   Review of Systems  Constitutional:  Negative for chills and fever.  HENT:  Negative for ear pain and sore throat.   Eyes:  Negative for pain and visual disturbance.  Respiratory:  Negative for cough and shortness of breath.   Cardiovascular:  Negative for chest pain and palpitations.  Gastrointestinal:  Positive for abdominal distention and anal bleeding. Negative for abdominal pain and vomiting.  Genitourinary:  Negative for dysuria and hematuria.  Musculoskeletal:  Negative for arthralgias and back pain.  Skin:  Negative for color change and rash.  Neurological:  Negative for seizures and syncope.  All other systems reviewed and are negative.   Physical Exam Updated Vital Signs BP (!) 142/104 (BP Location: Right Arm)   Pulse 78   Temp 98.1 F (36.7 C)   Resp 16   Wt 95.3 kg   SpO2 100%   BMI 31.01 kg/m  Physical Exam Vitals and nursing note reviewed.  Constitutional:      General: He is not in acute distress.    Appearance: He is well-developed.  HENT:     Head: Normocephalic and atraumatic.  Eyes:     Conjunctiva/sclera: Conjunctivae normal.  Cardiovascular:     Rate and Rhythm: Normal rate and regular rhythm.     Heart sounds: No murmur heard. Pulmonary:     Effort: Pulmonary effort is normal. No respiratory  distress.     Breath sounds: Normal breath sounds.  Abdominal:     Palpations: Abdomen is soft.     Tenderness: There is no abdominal tenderness.  Musculoskeletal:        General: No swelling.     Cervical back: Neck supple.  Skin:    General: Skin is warm and dry.     Capillary Refill: Capillary refill takes less than 2 seconds.  Neurological:     Mental Status: He is alert.  Psychiatric:        Mood and Affect: Mood normal.      ED Course/ Medical Decision Making/ A&P Clinical Course as of 04/11/23 1640  Wed Apr 11, 2023  1628 -Abdominal cramping -Lightheadedness -BRBPR this morning -HX of GERD/Hem -Denies FCNVD [CC]    Clinical Course User Index [CC] Glyn Ade, MD    Procedures Procedures   Medications Ordered in ED Medications - No data to display  Medical Decision Making:    Patient's history present on his physicals and findings are most consistent with hemorrhoidal bleeding event this morning. Patient has declined rectal exam for me.  He states the bleeding has stopped.  Denies any other symptoms. Patient states he just wants to know the results of his blood work does not want any further workup if reassuring.  Without evidence of anemia today. Given resolution of bleeding patient was comfortable with outpatient care and management.  Reviewed outside colonoscopy that demonstrated known history of internal hemorrhoids.  He stated he will start back on his laxative and fiber supplement and follow-up with his PCP/GI.  Clinical Impression:  1. BRBPR (bright red blood per rectum)      Discharge   Final Clinical Impression(s) / ED Diagnoses Final diagnoses:  BRBPR (bright red blood per rectum)    Rx / DC Orders ED Discharge Orders     None         Glyn Ade, MD 04/11/23 1640

## 2023-04-11 NOTE — ED Triage Notes (Addendum)
Dizzy since this AM. Stomach pain. BM with blood. Only one bloody stool. Hx hemorrhoids. Bright red blood. Denies syncope, no vision changes.  No daily meds. Hx acid reflux.

## 2023-04-11 NOTE — ED Notes (Signed)
Patient verbalizes understanding of discharge instructions. Opportunity for questioning and answers were provided. Patient discharged from ED.

## 2023-04-11 NOTE — Discharge Instructions (Addendum)
You are seen today for hemorrhoidal bleeding.  Please follow-up with your primary care provider.  Restart your laxative and work on supplementing fiber in your diet.

## 2023-04-12 ENCOUNTER — Encounter (HOSPITAL_BASED_OUTPATIENT_CLINIC_OR_DEPARTMENT_OTHER): Payer: Self-pay

## 2023-04-16 ENCOUNTER — Ambulatory Visit (HOSPITAL_BASED_OUTPATIENT_CLINIC_OR_DEPARTMENT_OTHER): Payer: MEDICAID | Admitting: Family Medicine

## 2023-04-17 ENCOUNTER — Ambulatory Visit (HOSPITAL_BASED_OUTPATIENT_CLINIC_OR_DEPARTMENT_OTHER): Payer: MEDICAID | Admitting: Family Medicine

## 2023-04-17 ENCOUNTER — Encounter (HOSPITAL_BASED_OUTPATIENT_CLINIC_OR_DEPARTMENT_OTHER): Payer: Self-pay | Admitting: Family Medicine

## 2023-04-17 VITALS — BP 144/92 | HR 74 | Ht 69.0 in | Wt 215.0 lb

## 2023-04-17 DIAGNOSIS — K649 Unspecified hemorrhoids: Secondary | ICD-10-CM

## 2023-04-17 DIAGNOSIS — M79671 Pain in right foot: Secondary | ICD-10-CM | POA: Diagnosis not present

## 2023-04-17 DIAGNOSIS — M778 Other enthesopathies, not elsewhere classified: Secondary | ICD-10-CM

## 2023-04-17 NOTE — Progress Notes (Signed)
Subjective:   Clinton Fisher 08/03/1990 04/17/2023  Chief Complaint  Patient presents with   Medical Management of Chronic Issues    Pt recently went to the ED after having a hemorrhoid burst. States he has been having pain in his right wrist again and states he believes the splinter is still in his left foot.    HPI: Clinton Fisher presents today for re-assessment and management of chronic medical conditions.   Hemorrhoid:  Patient went to the ER on September 18 for abdominal cramping, lightheadedness and bright red bleeding per rectum.  He has a history of internal hemorrhoids.  His hemoglobin hematocrit while in the ER were within normal limits.  Patient had declined a rectal exam while in the ER as he stated the bleeding had stopped.  He was recommended to follow-up with PCP, restart his laxative and fiber supplement for chronic constipation.  He denies any bleeding at this time will start on laxative and fiber supplement today. He was using "coffee" to help with constipation.    Wrist Pain: He did PT at home and had initial PT evaluation on August 29 and had injection by Orthopedics in July for wrist tendinitis with mild improvement. He states he has been having "swelling and purple tinge" to the wrist. Pain worsens with lifting.  Reports intermittent numbness and tingling.  Has full range of motion and strength present.   Foot CONCERN:  Patient has been reporting a splinter in his right foot near base of first toe since July 2024.  PCP attempted to remove with success in July and patient had resolution of pain.  He now states that pain has returned and he states it has turned into a "granuloma" and he sterilized his hunting knife and 'carved out a section" of the area of concern.  Denies any drainage, bleeding, fever or chills or warmth to the area.    The following portions of the patient's history were reviewed and updated as appropriate: past medical history, past  surgical history, family history, social history, allergies, medications, and problem list.   Patient Active Problem List   Diagnosis Date Noted   Class 1 obesity due to excess calories without serious comorbidity with body mass index (BMI) of 31.0 to 31.9 in adult 02/28/2023   Scrotal sebaceous cyst 11/09/2022   Tendinitis of flexor tendon of both hands 07/19/2022   Acute left-sided low back pain without sciatica 07/19/2022   Hemorrhoids 02/25/2021   High risk medication use 02/25/2021   Chronic pain of right knee 01/11/2021   Encounter for medical examination to establish care 01/11/2021   Bipolar 1 disorder (HCC) 10/18/2018   Tobacco use disorder 08/20/2017   GAD (generalized anxiety disorder) 09/25/2016   Past Medical History:  Diagnosis Date   Abrasion of index finger 07/29/2013   left   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   Manic affective disorder with recurrent episode (HCC)    Seasonal allergies    sore throat 07/29/2013   Skin problem 12/07/2021   Past Surgical History:  Procedure Laterality Date   FOOT FOREIGN BODY REMOVAL Right 01/19/2011   KNEE ARTHROSCOPY Right 08/01/2013   Procedure: ARTHROSCOPY RIGHT KNEE, Chondroplasty, Excision of Plica;  Surgeon: Harvie Junior, MD;  Location: [facility];  Service: Orthopedics;  Laterality: Right;   SCROTAL EXPLORATION N/A 11/09/2022   Procedure: [employer]e [employer]  EXPLORATION-excision of scrotal sebaceous cyst;  Surgeon: Malen Gauze, MD;  Location: [facility];  Service: Urology;  Laterality: N/A;   Family History  Problem Relation Age of Onset   Depression Mother    Colon polyps Mother    Outpatient Medications Prior to Visit  Medication Sig Dispense Refill   acetaminophen (TYLENOL) 500 MG tablet Take 500 mg by mouth every 6 (six) hours as needed for mild pain or moderate pain.     clobetasol (TEMOVATE) 0.05 % external  solution Apply 1 Application topically as needed (apply as needed to scalp).     cyclobenzaprine (FLEXERIL) 10 MG tablet Take 1 tablet (10mg  total) by mouth at bedtime if needed for neck pain. 30 tablet 0   fluticasone (CUTIVATE) 0.05 % cream Apply 1 Application topically as needed (apply as needed to face).     sulfamethoxazole-trimethoprim (BACTRIM DS) 800-160 MG tablet Take 1 tablet by mouth 2 (two) times daily. 20 tablet 0   No facility-administered medications prior to visit.   Allergies  Allergen Reactions   Trazodone And Nefazodone Other (See Comments)    priapism   Amoxicillin Diarrhea   Amoxicillin-Pot Clavulanate Diarrhea    Diarrhea    Clavulanic Acid Other (See Comments)    Unknown     ROS: A complete ROS was performed with pertinent positives/negatives noted in the HPI. The remainder of the ROS are negative.    Objective:   Today's Vitals   04/17/23 0916 04/17/23 0934  BP: (!) 136/92 (!) 144/92  Pulse: 74   SpO2: 100%   Weight: 215 lb (97.5 kg)   Height: 5\' 9"  (1.753 m)     Physical Exam          GENERAL: Well-appearing, in NAD. Well nourished.  SKIN: Pink, warm and dry. No rash, lesion, ulceration, or ecchymoses.  Head: Normocephalic. NECK: Trachea midline. Full ROM w/o pain or tenderness. RESPIRATORY: Chest wall symmetrical. Respirations even and non-labored.  MSK: Muscle tone and strength appropriate for age.  EXTREMITIES: Without clubbing, cyanosis, or edema.  Right wrist with full range of motion, cap refill less than 3 seconds and full sensation intact.  No signs of obvious edema or deformity.  Mild callus formation present to base of first toe of right foot.  No signs of redness, edema, or foreign body. NEUROLOGIC: No motor or sensory deficits. Steady, even gait. C2-C12 intact.  PSYCH/MENTAL STATUS: Alert, oriented x 3. Cooperative, appropriate mood and affect.     Assessment & Plan:  1. Right foot pain Given patient's concern of retained splinter  and PCP unable to remove or see possible foreign body at this time, will recommend referral to podiatry for more detailed evaluation and possible procedure to remove foreign body if present. - Ambulatory referral to Podiatry  2. Hemorrhoids, unspecified hemorrhoid type Stable and resolved at this time.  Discussed constipation regimen and increasing fiber to prevent return of hemorrhoids with patient.  3. Tendinitis of flexor tendon of both hands Recommend patient follow-up with orthopedics at [facility] as he is an established patient of their office and they have been managing this issue for him with previous injection and PT.  Recommend continuation of NSAIDs, bracing if needed and patient verbalized understanding.  No signs of any diminished circulation or nerve entrapment on exam today.   Return if symptoms worsen or fail to improve.    Patient to reach out to office if new, worrisome, or unresolved symptoms arise or if no improvement in patient's condition.  Patient verbalized understanding and is agreeable to treatment plan. All questions answered to patient's satisfaction.   Of note, portions of this note may have been created with voice recognition software Physicist, medical). While this note has been edited for accuracy, occasional wrong-word or 'sound-a-like' substitutions may have occurred due to the inherent limitations of voice recognition software.  Yolanda Manges, FNP

## 2023-04-17 NOTE — Patient Instructions (Addendum)
Please follow up with Orthopedics regarding the wrist pain.   CONSTIPATION: Dietary changes (more leafy greens, vegetables and fruits; less processed foods) Drink 64 oz of water a day. Ensure regular exercise.    Fiber supplementation (Benefiber, FiberCon, Metamucil, or Psyllium). Start slow and increase gradually to full dose.  Add a probiotic (such as Florastor) daily. Over-the-counter stool softeners such as: Docusate or Colace  If you are not able to have regular BM's with the above regimen, you may add miralax 1 tablespoon daily.  Increase or decrease amount/frequency as needed to ensure 1 soft BM/day.   Natural approach for constipation:  "Power Pudding" is a natural mixture that may help your constipation.  To make blend 1 cup applesauce, 1 cup wheat bran, and 3/4 cup prune juice, refrigerate and then take 1 tablespoon daily with a large glass of water as needed.

## 2023-04-19 ENCOUNTER — Ambulatory Visit (HOSPITAL_BASED_OUTPATIENT_CLINIC_OR_DEPARTMENT_OTHER): Payer: MEDICAID | Admitting: Student

## 2023-04-19 ENCOUNTER — Encounter (HOSPITAL_BASED_OUTPATIENT_CLINIC_OR_DEPARTMENT_OTHER): Payer: Self-pay | Admitting: Student

## 2023-04-19 DIAGNOSIS — M778 Other enthesopathies, not elsewhere classified: Secondary | ICD-10-CM

## 2023-04-19 NOTE — Progress Notes (Signed)
Chief Complaint: Right wrist pain     History of Present Illness:    Clinton Fisher is a 32 y.o. right-hand-dominant male presenting today for evaluation of right wrist pain.  This has been ongoing for the past 2 months after he reports he was chopping down a tree.  Pain is located mainly on the ulnar side of the dorsal wrist and he describes it as an aching, burning sensation.  Pain levels are moderate to severe and this has been preventing him from sleeping at night.  He mainly has pain and difficulty when holding or manipulating objects.  He has previously tried cortisone injection, NSAIDs, steroid taper, bracing, and physical therapy without any significant improvements.  He did also have some numbness earlier today extending into all the fingers of the right hand, however this has subsided as of now.  Has been taking Tylenol for pain.   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   Manic affective disorder with recurrent episode (HCC)    Seasonal allergies    sore throat 07/29/2013   Skin problem 12/07/2021   Past Surgical History:  Procedure Laterality Date   FOOT FOREIGN BODY REMOVAL Right 01/19/2011   KNEE ARTHROSCOPY Right 08/01/2013   Procedure: ARTHROSCOPY RIGHT KNEE, Chondroplasty, Excision of Plica;  Surgeon: Harvie Junior, MD;  Location: [facility];  Service: Orthopedics;  Laterality: Right;   SCROTAL EXPLORATION N/A 11/09/2022   Procedure: SCROTUM EXPLORATION-excision of scrotal sebaceous cyst;  Surgeon: Malen Gauze, MD;  Location: [facility];  Service: Urology;  Laterality: N/A;   Social History   Socioeconomic History   Marital status: Single    Spouse  name: Not on file   Number of children: 1   Years of education: Not on file   Highest education level: Not on file  Occupational History   Not on file  Tobacco Use   Smoking status: Every Day    Types: E-cigarettes   Smokeless tobacco: Former  Advertising account planner   Vaping status: Every Day  Substance and Sexual Activity   Alcohol use: Yes    Comment: every 3 days   Drug use: No   Sexual activity: Not Currently  Other Topics Concern   Not on file  Social History Narrative   Not on file   Social Determinants of Health   Financial Resource Strain: Not on file  Food Insecurity: Not on file  Transportation Needs: No Transportation Needs (03/09/2023)   PRAPARE - Administrator, Civil Service (Medical): No    Lack of Transportation (Non-Medical): No  Physical Activity: Not on file  Stress: Not on file  Social Connections: Unknown (11/29/2021)   Received [facility]   Social Network    Social Network: Not on file   Family History  Problem Relation Age of Onset   Depression Mother    Colon polyps Mother    Allergies  Allergen Reactions   Trazodone And Nefazodone Other (See Comments)    priapism   Amoxicillin  Diarrhea   Amoxicillin-Pot Clavulanate Diarrhea    Diarrhea    Clavulanic Acid Other (See Comments)    Unknown   Current Outpatient Medications  Medication Sig Dispense Refill   acetaminophen (TYLENOL) 500 MG tablet Take 500 mg by mouth every 6 (six) hours as needed for mild pain or moderate pain.     clobetasol (TEMOVATE) 0.05 % external solution Apply 1 Application topically as needed (apply as needed to scalp).     cyclobenzaprine (FLEXERIL) 10 MG tablet Take 1 tablet (10mg  total) by mouth at bedtime if needed for neck pain. 30 tablet 0   fluticasone (CUTIVATE) 0.05 % cream Apply 1 Application topically as needed (apply as needed to face).     sulfamethoxazole-trimethoprim (BACTRIM DS) 800-160 MG tablet Take 1 tablet by mouth 2 (two) times  daily. 20 tablet 0   No current facility-administered medications for this visit.   No results found.  Review of Systems:   A ROS was performed including pertinent positives and negatives as documented in the HPI.  Physical Exam :   Constitutional: NAD and appears stated age Neurological: Alert and oriented Psych: Appropriate affect and cooperative There were no vitals taken for this visit.   Comprehensive Musculoskeletal Exam:    Tenderness to palpation with mild swelling and ecchymosis noted over the ulnar styloid of the right wrist.  Full wrist range of motion with flexion, extension, and ulnar/radial deviation.  Able to form a fist with good strength.  Grip strength 4/5 compared to contralateral side.  Radial pulse 2+.  Neurosensory exam intact.  Imaging:   Xray review from 01/24/23 (right wrist 4 views): Negative    I personally reviewed and interpreted the radiographs.   Assessment:   32 y.o. male with 30-month history of ulnar-sided right wrist pain.  This does seem consistent with tendonitis, likely of the ECU tendon due to the location of symptoms.  Given that he has not improved with multiple conservative therapies, I would like to get him in with our hand specialist Dr. Denese Killings for evaluation to discuss any other treatment options.  Patient is agreeable to this and would like to proceed.  Will plan to continue Tylenol for pain control and he has been recommended to avoid anti-inflammatories.  Plan :    - Referral to Dr. Denese Killings for further evaluation    I personally saw and evaluated the patient, and participated in the management and treatment plan.  Hazle Nordmann, PA-C Orthopedics

## 2023-04-20 NOTE — Addendum Note (Signed)
Addended by: Barbette Or on: 04/20/2023 10:57 AM   Modules accepted: Orders

## 2023-04-25 ENCOUNTER — Ambulatory Visit (INDEPENDENT_AMBULATORY_CARE_PROVIDER_SITE_OTHER): Payer: MEDICAID | Admitting: Podiatry

## 2023-04-25 DIAGNOSIS — M7751 Other enthesopathy of right foot: Secondary | ICD-10-CM | POA: Diagnosis not present

## 2023-04-25 NOTE — Progress Notes (Signed)
  Subjective:  Patient ID: Clinton Fisher, male    DOB: [birth year],  MRN: [id number]  Chief Complaint  Patient presents with   Callouses    32 y.o. male presents with the above complaint.  Patient presents with right submetatarsal 5 porokeratotic lesion painful to touch is progressive gotten worse worse with ambulation worse with pressure he would like to have it debrided down he has not seen anyone as prior to seeing me denies any other acute complaints.  Pain scale 7 out of 10 dull aching nature   Review of Systems: Negative except as noted in the HPI. Denies N/V/F/Ch.  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   Manic affective disorder with recurrent episode (HCC)    Seasonal allergies    sore throat 07/29/2013   Skin problem 12/07/2021    Current Outpatient Medications:    acetaminophen (TYLENOL) 500 MG tablet, Take 500 mg by mouth every 6 (six) hours as needed for mild pain or moderate pain., Disp: , Rfl:    clobetasol (TEMOVATE) 0.05 % external solution, Apply 1 Application topically as needed (apply as needed to scalp)., Disp: , Rfl:    cyclobenzaprine (FLEXERIL) 10 MG tablet, Take 1 tablet (10mg  total) by mouth at bedtime if needed for neck pain., Disp: 30 tablet, Rfl: 0   fluticasone (CUTIVATE) 0.05 % cream, Apply 1 Application topically as needed (apply as needed to face)., Disp: , Rfl:    sulfamethoxazole-trimethoprim (BACTRIM DS) 800-160 MG tablet, Take 1 tablet by mouth 2 (two) times daily., Disp: 20 tablet, Rfl: 0  Social History   Tobacco Use  Smoking Status Every Day   Types: E-cigarettes  Smokeless Tobacco Former    Allergies  Allergen Reactions   Trazodone And Nefazodone Other (See Comments)    priapism   Amoxicillin Diarrhea   Amoxicillin-Pot Clavulanate Diarrhea    Diarrhea     Clavulanic Acid Other (See Comments)    Unknown   Objective:  There were no vitals filed for this visit. There is no height or weight on file to calculate BMI. Constitutional Well developed. Well nourished.  Vascular Dorsalis pedis pulses palpable bilaterally. Posterior tibial pulses palpable bilaterally. Capillary refill normal to all digits.  No cyanosis or clubbing noted. Pedal hair growth normal.  Neurologic Normal speech. Oriented to person, place, and time. Epicritic sensation to light touch grossly present bilaterally.  Dermatologic Right submetatarsal 5 porokeratotic lesion with central nucleated core noted.  Pain on palpation to the lesion underlying capsulitis noted  Orthopedic: Normal joint ROM without pain or crepitus bilaterally. No visible deformities. No bony tenderness.   Radiographs: None Assessment:   1. Capsulitis of metatarsophalangeal (MTP) joint of right foot    Plan:  Patient was evaluated and treated and all questions answered.  Right fifth metatarsophalangeal joint capsulitis with underlying submetatarsal 5 benign skin lesion -All questions and concerns were discussed with the patient extensive detail given the amount of pain that is having a benefit from steroid to a great injection. -A steroid injection was performed at right fifth metatarsophalangeal joint using 1% plain Lidocaine and 10 mg of Kenalog. This was well tolerated. -Discussed shoe gear modification   No follow-ups on file.

## 2023-05-21 ENCOUNTER — Ambulatory Visit (INDEPENDENT_AMBULATORY_CARE_PROVIDER_SITE_OTHER): Payer: MEDICAID | Admitting: Orthopedic Surgery

## 2023-05-21 DIAGNOSIS — M778 Other enthesopathies, not elsewhere classified: Secondary | ICD-10-CM

## 2023-05-21 NOTE — Progress Notes (Signed)
Clinton Fisher - 32 y.o. male MRN [id number]  Date of birth: [birth year]  Office Visit Note: Visit Date: 05/21/2023 PCP: Hilbert Bible, FNP Referred by: Amador Cunas, PA*  Subjective: No chief complaint on file.  HPI: Clinton Fisher is a pleasant 32 y.o. male who presents today for evaluation of ongoing right ulnar-sided wrist pain with associated numbness and tingling of the ulnar-sided digits of the right hand.  For the ulnar-sided wrist pain, he was diagnosed with ECU tendinitis and has undergone prior bracing and cortisone injection.  No prior workup for the numbness throughout the ulnar aspect of the right hand.  Pertinent ROS were reviewed with the patient and found to be negative unless otherwise specified above in HPI.   Visit Reason: right wrist/forearm, elbow Duration of symptoms: 4+ months Hand dominance: right Occupation: Data Entry Diabetic: no  Smoking: Yes Heart/Lung History: none Blood Thinners:  none  Prior Testing/EMG: xrays 01/24/23 Injections (Date): 02/05/23 Treatments: injection, brace  Prior Surgery: none  Assessment & Plan: Visit Diagnoses: No diagnosis found.  Plan: Will obtain a right upper extremity EMG to better delineate potential pathology from a nerve compression standpoint.  As for the ulnar-sided wrist pain, this is fairly minimal today on examination, we will continue with conservative modalities for the time being.  He will return after the EMG for discussion regarding results and discussion of potential treatment options.  Follow-up: No follow-ups on file.   Meds & Orders: No orders of the defined types were placed in this encounter.  No orders of the defined types were placed in this encounter.    Procedures: No procedures performed      Clinical History: No specialty comments available.  He reports that he has been smoking e-cigarettes. He has quit using smokeless tobacco.  Recent Labs    02/21/23 0822  HGBA1C  5.5    Objective:   Vital Signs: There were no vitals taken for this visit.  Physical Exam  Gen: Well-appearing, in no acute distress; non-toxic CV: Regular Rate. Well-perfused. Warm.  Resp: Breathing unlabored on room air; no wheezing. Psych: Fluid speech in conversation; appropriate affect; normal thought process  Ortho Exam PHYSICAL EXAM:  General: Patient is well appearing and in no distress. Cervical spine mobility is full in all directions:  Skin and Muscle: No skin changes are apparent to upper extremities.  Muscle bulk and contour normal, no signs of atrophy.     Range of Motion and Palpation Tests: Mobility is full about the elbows with flexion and extension.  Forearm supination and pronation are 85/85 bilaterally.  Wrist flexion/extension is 75/65 bilaterally.  Digital flexion and extension are full.  Thumb opposition is full to the base of the small fingers bilaterally.    No cords or nodules are palpated.  No triggering is observed.    No tenderness over the thumb CMC articulations is observed.  Scaphoid shift test is negative bilaterally.  Finklestein test is negative bilaterally.  Ulnar impingement test is negative bilaterally.  No evidence of radiocarpal, midcarpal or intercarpal joint instability with provocation.  Neurologic, Vascular, Motor: Sensation is intact to light touch in the median/radial, slightly diminished in the right ulnar distributions.  Tinel's testing positive at right cubital tunnel, negative left cubital and carpal tunnel.   Phalen's negative, Derkan's compression negative bilaterally  Fingers pink and well perfused.  Capillary refill is brisk.      Lab Results  Component Value Date   HGBA1C 5.5 02/21/2023  Imaging: No results found.  Past Medical/Family/Surgical/Social History: Medications & Allergies reviewed per EMR, new medications updated. Patient Active Problem List   Diagnosis Date Noted   Class 1 obesity due to excess  calories without serious comorbidity with body mass index (BMI) of 31.0 to 31.9 in adult 02/28/2023   Scrotal sebaceous cyst 11/09/2022   Tendinitis of flexor tendon of both hands 07/19/2022   Acute left-sided low back pain without sciatica 07/19/2022   Hemorrhoids 02/25/2021   High risk medication use 02/25/2021   Chronic pain of right knee 01/11/2021   Encounter for medical examination to establish care 01/11/2021   Bipolar 1 disorder (HCC) 10/18/2018   Tobacco use disorder 08/20/2017   GAD (generalized anxiety disorder) 09/25/2016   Past Medical History:  Diagnosis Date   Abrasion of index finger 07/29/2013   left   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   Manic affective disorder with recurrent episode (HCC)    Seasonal allergies    sore throat 07/29/2013   Skin problem 12/07/2021   Family History  Problem Relation Age of Onset   Depression Mother    Colon polyps Mother    Past Surgical History:  Procedure Laterality Date   FOOT FOREIGN BODY REMOVAL Right 01/19/2011   KNEE ARTHROSCOPY Right 08/01/2013   Procedure: ARTHROSCOPY RIGHT KNEE, Chondroplasty, Excision of Plica;  Surgeon: Harvie Junior, MD;  Location: [facility];  Service: Orthopedics;  Laterality: Right;   SCROTAL EXPLORATION N/A 11/09/2022   Procedure: SCROTUM EXPLORATION-excision of scrotal sebaceous cyst;  Surgeon: Malen Gauze, MD;  Location: [facility];  Service: Urology;  Laterality: N/A;   Social History   Occupational History   Not on file  Tobacco Use   Smoking status: Every Day    Types: E-cigarettes   Smokeless tobacco: Former  Building services engineer status: Every Day  Substance and Sexual Activity   Alcohol use: Yes    Comment: every 3 days   Drug use: No   Sexual activity: Not Currently    Tacia Hindley Fara Boros) Denese Killings, M.D. [facility] 9:04 PM

## 2023-05-22 ENCOUNTER — Encounter: Payer: Self-pay | Admitting: Urology

## 2023-05-22 ENCOUNTER — Ambulatory Visit: Payer: MEDICAID | Admitting: Urology

## 2023-05-22 VITALS — BP 129/94 | HR 87 | Ht 69.0 in | Wt 215.0 lb

## 2023-05-22 DIAGNOSIS — L723 Sebaceous cyst: Secondary | ICD-10-CM

## 2023-05-22 DIAGNOSIS — N4889 Other specified disorders of penis: Secondary | ICD-10-CM | POA: Diagnosis not present

## 2023-05-23 ENCOUNTER — Ambulatory Visit (INDEPENDENT_AMBULATORY_CARE_PROVIDER_SITE_OTHER): Payer: MEDICAID | Admitting: Podiatry

## 2023-05-23 ENCOUNTER — Encounter: Payer: Self-pay | Admitting: Podiatry

## 2023-05-23 DIAGNOSIS — Z01818 Encounter for other preprocedural examination: Secondary | ICD-10-CM

## 2023-05-23 DIAGNOSIS — D492 Neoplasm of unspecified behavior of bone, soft tissue, and skin: Secondary | ICD-10-CM | POA: Diagnosis not present

## 2023-05-23 NOTE — Progress Notes (Signed)
05/23/23 7:58 AM   Clinton Fisher [birth year] [id number]  CC: Penile lesion  HPI: 32 yo M who has been previously followed by Dr Ronne Binning in [place], as well as Dr Lafonda Mosses in [place]. He underwent excision of a 3cm sebaceous cyst at the penoscrotal junction by Dr Ronne Binning in April 2024, he reports his wound opened post-op and he needed to perform a fair amount of wound care. He thinks the cyst has come back, and he is bothered by the cosmetic appearance. He had a follow up scrotal US that was benign. He also got a 2nd opinion from Dr Lafonda Mosses who was planning a scar revision, but his ultimately the [facility] did not take his insurance. He is seeking another opinion. He took a week of bactrim a few months ago when he felt the area was infected. He also has been "plucking hairs" near the area.   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   Manic affective disorder with recurrent episode (HCC)    Seasonal allergies    sore throat 07/29/2013   Skin problem 12/07/2021    Surgical History: Past Surgical History:  Procedure Laterality Date   FOOT FOREIGN BODY REMOVAL Right 01/19/2011   KNEE ARTHROSCOPY Right 08/01/2013   Procedure: ARTHROSCOPY RIGHT KNEE, Chondroplasty, Excision of Plica;  Surgeon: Harvie Junior, MD;  Location: [facility];  Service: Orthopedics;  Laterality: Right;   SCROTAL EXPLORATION N/A 11/09/2022   Procedure: SCROTUM EXPLORATION-excision of scrotal sebaceous cyst;  Surgeon: Malen Gauze, MD;  Location: [facility];  Service: Urology;  Laterality: N/A;     Family History: Family History  Problem Relation Age of Onset   Depression Mother    Colon polyps Mother     Social History:  reports that he has been smoking e-cigarettes. He has been exposed to tobacco smoke. He  has quit using smokeless tobacco. He reports current alcohol use. He reports that he does not use drugs.  Physical Exam: BP (!) 129/94   Pulse 87   Ht 5\' 9"  (1.753 m)   Wt 215 lb (97.5 kg)   BMI 31.75 kg/m    Constitutional:  Alert and oriented, No acute distress. Cardiovascular: No clubbing, cyanosis, or edema. Respiratory: Normal respiratory effort, no increased work of breathing. GI: Abdomen is soft, nontender, nondistended, no abdominal masses GU: phallus with patent meatus, no testicular lesions, subtle 5mm nodular area at the left base of the penile shaft, non tender, no evidence of erythema or infection  Assessment & Plan:   32 yo M with excision of sebaceous cyst at the penoscrotal junction by Dr Ronne Binning in April 2024. He feels there is residual tissue he would like excised. On exam, very minimal nodular tissue at the left penile base that may be more consistent with scarring as opposed to a well defined sebaceous cyst. I had a frank conversation with him that this lesion is very subtle, and any repeat surgeries may cause additional scarring. No evidence of infection today that would require antibiotics or urgent I&D. He will return to Dr Ronne Binning to see if he is weilling to perform an additional procedure.  Follow up with Dr Ronne Binning to consider additional options  Legrand Rams, MD 05/23/2023  [facility] [address], [address] [place], [place] [address] [phone]

## 2023-05-23 NOTE — Progress Notes (Signed)
Subjective:  Patient ID: Clinton Fisher, male    DOB: July 07, 1991,  MRN: [id number]  Chief Complaint  Patient presents with   Foot Pain    Patient states he is having incision issues.    32 y.o. male presents with the above complaint.  Patient presents with right fifth submet 5 plantar skin lesion.  Patient states that has been present for quite some time the skin neoplasm is causing some issues.  He would like to discuss surgical options to remove it.  He has not seen anyone else prior to seeing me for this.   Review of Systems: Negative except as noted in the HPI. Denies N/V/F/Ch.  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   Manic affective disorder with recurrent episode (HCC)    Seasonal allergies    sore throat 07/29/2013   Skin problem 12/07/2021    Current Outpatient Medications:    acetaminophen (TYLENOL) 500 MG tablet, Take 500 mg by mouth every 6 (six) hours as needed for mild pain or moderate pain., Disp: , Rfl:    clobetasol (TEMOVATE) 0.05 % external solution, Apply 1 Application topically as needed (apply as needed to scalp)., Disp: , Rfl:    fluticasone (CUTIVATE) 0.05 % cream, Apply 1 Application topically as needed (apply as needed to face)., Disp: , Rfl:   Social History   Tobacco Use  Smoking Status Every Day   Types: E-cigarettes   Passive exposure: Current  Smokeless Tobacco Former    Allergies  Allergen Reactions   Trazodone And Nefazodone Other (See Comments)    priapism   Amoxicillin Diarrhea   Amoxicillin-Pot Clavulanate Diarrhea    Diarrhea    Clavulanic Acid Other (See Comments)    Unknown   Objective:  There were no vitals filed for this visit. There is no height or weight on file to calculate BMI. Constitutional Well developed. Well nourished.  Vascular  Dorsalis pedis pulses palpable bilaterally. Posterior tibial pulses palpable bilaterally. Capillary refill normal to all digits.  No cyanosis or clubbing noted. Pedal hair growth normal.  Neurologic Normal speech. Oriented to person, place, and time. Epicritic sensation to light touch grossly present bilaterally.  Dermatologic Skin neoplasm noted to submetatarsal 5 right foot.  Pain on palpation to the lesion.  No pinpoint bleeding noted upon debridement.  Orthopedic: Normal joint ROM without pain or crepitus bilaterally. No visible deformities. No bony tenderness.   Radiographs: None Assessment:   1. Skin neoplasm   2. Encounter for preoperative examination for general surgical procedure    Plan:  Patient was evaluated and treated and all questions answered.  Right submet 5 benign skin lesion/skin neoplasm -All questions and concerns were discussed with the patient in extensive detail given the amount of pain that he is experiencing he will benefit from surgery for excision of the lesion.  I discussed with him he will need to be partial weightbearing to the heel/nonweightbearing to not get the stitches.  I discussed my preoperative intra postoperative plan with the patient in extensive detail he states understand like to proceed with surgery -Informed surgical risk consent was reviewed and read aloud to the patient.  I reviewed the films.  I have discussed my findings with the patient in great detail.  I have discussed all risks including  but not limited to infection, stiffness, scarring, limp, disability, deformity, damage to blood vessels and nerves, numbness, poor healing, need for braces, arthritis, chronic pain, amputation, death.  All benefits and realistic expectations discussed in great detail.  I have made no promises as to the outcome.  I have provided realistic expectations.  I have offered the patient a 2nd opinion, which they have declined and assured me they preferred to  proceed despite the risks   No follow-ups on file.

## 2023-05-30 ENCOUNTER — Telehealth: Payer: Self-pay | Admitting: Podiatry

## 2023-05-30 NOTE — Telephone Encounter (Signed)
DOS-06/25/2023  EXC BENIGN LESION RT- 11426  VAYA EFFECTIVE DATE- 05/30/2023  SPOKE WITH CANDACE M. FROM [facility] AND SHE STATED THAT PRIOR AUTH IS NOT REQUIRED FOR CPT CODE 42876 , GOOD UNTIL 08/25/2023. SHE ALSO STATED THAT PRIOR AUTH IS NOT REQUIRED/ HAS BEEN WAIVED FOR TAILORED PLAN MEMBERS UNTIL 08/25/2023.  CALL REF #: [id number]

## 2023-06-08 ENCOUNTER — Ambulatory Visit: Payer: MEDICAID | Admitting: Urology

## 2023-06-08 ENCOUNTER — Encounter: Payer: Self-pay | Admitting: Urology

## 2023-06-08 ENCOUNTER — Telehealth: Payer: Self-pay | Admitting: Orthopedic Surgery

## 2023-06-08 ENCOUNTER — Other Ambulatory Visit: Payer: Self-pay | Admitting: Orthopedic Surgery

## 2023-06-08 VITALS — BP 125/85 | HR 94

## 2023-06-08 DIAGNOSIS — L723 Sebaceous cyst: Secondary | ICD-10-CM

## 2023-06-08 DIAGNOSIS — M778 Other enthesopathies, not elsewhere classified: Secondary | ICD-10-CM

## 2023-06-08 NOTE — Patient Instructions (Signed)
Epidermoid Cyst  An epidermoid cyst, also called an epidermal cyst, is a small lump under your skin. The cyst contains a substance called keratin. Do not try to pop or open the cyst yourself. What are the causes? A blocked hair follicle. A hair that curls and re-enters the skin instead of growing straight out of the skin. A blocked pore. Irritated skin. An injury to the skin. Certain conditions that are passed along from parent to child. Human papillomavirus (HPV). This happens rarely when cysts occur on the bottom of the feet. Long-term sun damage to the skin. What increases the risk? Having acne. Being male. Having an injury to the skin. Being past puberty. Having certain conditions caused by genes (genetic disorder) What are the signs or symptoms? These cysts are usually harmless, but they can get infected. Symptoms of infection may include: Redness. Inflammation. Tenderness. Warmth. Fever. A bad-smelling substance that drains from the cyst. Pus that drains from the cyst. How is this treated? In many cases, epidermoid cysts go away on their own without treatment. If a cyst becomes infected, treatment may include: Opening and draining the cyst, done by a doctor. After draining, you may need minor surgery to remove the rest of the cyst. Antibiotic medicine. Shots of medicines (steroids) that help to reduce inflammation. Surgery to remove the cyst. Surgery may be done if the cyst: Becomes large. Bothers you. Has a chance of turning into cancer. Do not try to open a cyst yourself. Follow these instructions at home: Medicines Take over-the-counter and prescription medicines as told by your doctor. If you were prescribed an antibiotic medicine, take it as told by your doctor. Do not stop taking it even if you start to feel better. General instructions Keep the area around your cyst clean and dry. Wear loose, dry clothing. Avoid touching your cyst. Check your cyst every day  for signs of infection. Check for: Redness, swelling, or pain. Fluid or blood. Warmth. Pus or a bad smell. Keep all follow-up visits. How is this prevented? Wear clean, dry, clothing. Avoid wearing tight clothing. Keep your skin clean and dry. Take showers or baths every day. Contact a doctor if: Your cyst has symptoms of infection. Your condition does not improve or gets worse. You have a cyst that looks different from other cysts you have had. You have a fever. Get help right away if: Redness spreads from the cyst into the area close by. Summary An epidermoid cyst is a small lump under your skin. If a cyst becomes infected, treatment may include surgery to open and drain the cyst, or to remove it. Take over-the-counter and prescription medicines only as told by your doctor. Contact a doctor if your condition is not improving or is getting worse. Keep all follow-up visits. This information is not intended to replace advice given to you by your health care provider. Make sure you discuss any questions you have with your health care provider. Document Revised: 10/14/2019 Document Reviewed: 10/15/2019 Elsevier Patient Education  2024 [employer].

## 2023-06-08 NOTE — Progress Notes (Signed)
06/08/2023 11:41 AM   Clinton Fisher 02/18/1991 [id number]  Referring provider: Hilbert Bible, FNP [address] [place],  [place] [address]  Followup scrotal cyst   HPI: Clinton Fisher is a 32yo here for followup for a scrotal sebaceous cyst. He formed a stitch abscess post op which since drained and the incision healed without issue. He was removing hairs on his scrotum and developed an cyst. 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   Manic affective disorder with recurrent episode (HCC)    Seasonal allergies    sore throat 07/29/2013   Skin problem 12/07/2021    Surgical History: Past Surgical History:  Procedure Laterality Date   FOOT FOREIGN BODY REMOVAL Right 01/19/2011   KNEE ARTHROSCOPY Right 08/01/2013   Procedure: ARTHROSCOPY RIGHT KNEE, Chondroplasty, Excision of Plica;  Surgeon: Harvie Junior, MD;  Location: [facility];  Service: Orthopedics;  Laterality: Right;   SCROTAL EXPLORATION N/A 11/09/2022   Procedure: SCROTUM EXPLORATION-excision of scrotal sebaceous cyst;  Surgeon: Malen Gauze, MD;  Location: [facility];  Service: Urology;  Laterality: N/A;    Home Medications:  Allergies as of 06/08/2023       Reactions   Trazodone And Nefazodone Other (See Comments)   priapism   Amoxicillin Diarrhea   Amoxicillin-pot Clavulanate Diarrhea   Diarrhea   Clavulanic Acid Other (See Comments)   Unknown        Medication List        Accurate as of June 08, 2023 11:41 AM. If you have any questions, ask your nurse or doctor.          acetaminophen 500 MG tablet Commonly known as: TYLENOL Take 500 mg by mouth every 6 (six) hours as needed for mild pain or moderate pain.   clobetasol 0.05 %  external solution Commonly known as: TEMOVATE Apply 1 Application topically as needed (apply as needed to scalp).   fluticasone 0.05 % cream Commonly known as: CUTIVATE Apply 1 Application topically as needed (apply as needed to face).        Allergies:  Allergies  Allergen Reactions   Trazodone And Nefazodone Other (See Comments)    priapism   Amoxicillin Diarrhea   Amoxicillin-Pot Clavulanate Diarrhea    Diarrhea    Clavulanic Acid Other (See Comments)    Unknown    Family History: Family History  Problem Relation Age of Onset   Depression Mother    Colon polyps Mother     Social History:  reports that he has been smoking e-cigarettes. He has been exposed to tobacco smoke. He has quit using smokeless tobacco. He reports current alcohol use. He reports that he does not use drugs.  ROS: All other review of systems were reviewed and are negative except what is noted above in HPI  Physical Exam: BP 125/85   Pulse 94   Constitutional:  Alert and oriented, No acute distress. HEENT: [place] AT, moist mucus membranes.  Trachea midline, no masses. Cardiovascular: No clubbing, cyanosis, or edema. Respiratory: Normal respiratory effort, no increased work of breathing. GI: Abdomen is soft, nontender, nondistended, no abdominal masses GU: No CVA tenderness. 1.5cm left proximal penile shaft epidermoid cyst.  Lymph: No cervical  or inguinal lymphadenopathy. Skin: No rashes, bruises or suspicious lesions. Neurologic: Grossly intact, no focal deficits, moving all 4 extremities. Psychiatric: Normal mood and affect.  Laboratory Data: Lab Results  Component Value Date   WBC 6.4 04/11/2023   HGB 16.5 04/11/2023   HCT 48.3 04/11/2023   MCV 88.0 04/11/2023   PLT 272 04/11/2023    Lab Results  Component Value Date   CREATININE 0.75 04/11/2023    No results found for: "PSA"  No results found for: "TESTOSTERONE"  Lab Results  Component Value Date   HGBA1C 5.5 02/21/2023     Urinalysis    Component Value Date/Time   COLORURINE YELLOW 11/13/2022 2015   APPEARANCEUR CLEAR 11/13/2022 2015   APPEARANCEUR Clear 09/22/2022 0948   LABSPEC 1.032 (H) 11/13/2022 2015   PHURINE 5.5 11/13/2022 2015   GLUCOSEU NEGATIVE 11/13/2022 2015   HGBUR SMALL (A) 11/13/2022 2015   BILIRUBINUR NEGATIVE 11/13/2022 2015   BILIRUBINUR Negative 09/22/2022 0948   KETONESUR NEGATIVE 11/13/2022 2015   PROTEINUR 30 (A) 11/13/2022 2015   UROBILINOGEN 0.2 10/15/2012 2158   NITRITE NEGATIVE 11/13/2022 2015   LEUKOCYTESUR NEGATIVE 11/13/2022 2015    Lab Results  Component Value Date   LABMICR See below: 09/22/2022   WBCUA 0-5 09/22/2022   LABEPIT 0-10 09/22/2022   BACTERIA NONE SEEN 11/13/2022    Pertinent Imaging:  No results found for this or any previous visit.  No results found for this or any previous visit.  No results found for this or any previous visit.  No results found for this or any previous visit.  No results found for this or any previous visit.  No valid procedures specified. No results found for this or any previous visit.  No results found for this or any previous visit.   Assessment & Plan:    1. Scrotal sebaceous cyst We discussed the management including observation versus excision and the patient elects for excision. Risks/benefits/alternatives discussed   No follow-ups on file.  Wilkie Aye, MD  [facility] [place]

## 2023-06-08 NOTE — Telephone Encounter (Signed)
Pt called requesting a call back. Pt states he has been waiting for a call per Dr Fara Boros for pt to be scheduled for right upper extremity EMG referral . Please call pt about this matter at [phone].

## 2023-06-08 NOTE — Telephone Encounter (Signed)
Spoke with Ellin Goodie about this patient. We initially were going to put him on for Tuesday morning at 10am but he is having surgery that day. He is now scheduled for Friday morning at 930am.

## 2023-06-15 ENCOUNTER — Ambulatory Visit (INDEPENDENT_AMBULATORY_CARE_PROVIDER_SITE_OTHER): Payer: MEDICAID | Admitting: Physical Medicine and Rehabilitation

## 2023-06-15 ENCOUNTER — Encounter: Payer: Self-pay | Admitting: Physical Medicine and Rehabilitation

## 2023-06-15 DIAGNOSIS — R202 Paresthesia of skin: Secondary | ICD-10-CM | POA: Diagnosis not present

## 2023-06-15 NOTE — Progress Notes (Signed)
Functional Pain Scale - descriptive words and definitions  Distracting (5)    Aware of pain/able to complete some ADL's but limited by pain/sleep is affected and active distractions are only slightly useful. Moderate range order  Average Pain 5  RUE NCS- He has pain, inflammation and numbness in right hand and wrist.  No difficultly grasping. Starting to happen to left hand and wrist.

## 2023-06-18 ENCOUNTER — Telehealth: Payer: Self-pay | Admitting: Orthopedic Surgery

## 2023-06-18 NOTE — Telephone Encounter (Signed)
Pt had EMG and NCS on 11/22 pt called in to set appt he stated he is in pain cannot come in the week of the 2nd he is having foot surgery he was thinking maybe the 9th that Monday between 10-11 some time, said he could not wait until the next avail 12/23 being he was in pain

## 2023-06-19 ENCOUNTER — Encounter: Payer: MEDICAID | Admitting: Physical Medicine and Rehabilitation

## 2023-06-19 NOTE — Telephone Encounter (Signed)
Patient is scheduled   

## 2023-06-20 NOTE — Procedures (Signed)
EMG & NCV Findings: All nerve conduction studies (as indicated in the following tables) were within normal limits.    All examined muscles (as indicated in the following table) showed no evidence of electrical instability.    Impression: Essentially NORMAL electrodiagnostic study of the right upper limb.  There is no significant electrodiagnostic evidence of nerve entrapment, brachial plexopathy or cervical radiculopathy.    As you know, purely sensory or demyelinating radiculopathies and chemical radiculitis may not be detected with this particular electrodiagnostic study. **This electrodiagnostic study cannot rule out small fiber polyneuropathy and dysesthesias from central pain syndromes such as stroke or central pain sensitization syndromes such as fibromyalgia.  Myotomal referral pain from trigger points is also not excluded.  Recommendations: 1.  Follow-up with referring physician. 2.  Continue current management of symptoms.  ___________________________ Naaman Plummer FAAPMR Board Certified, American Board of Physical Medicine and Rehabilitation    Nerve Conduction Studies Anti Sensory Summary Table   Stim Site NR Peak (ms) Norm Peak (ms) P-T Amp (V) Norm P-T Amp Site1 Site2 Delta-P (ms) Dist (cm) Vel (m/s) Norm Vel (m/s)  Right Median Acr Palm Anti Sensory (2nd Digit)  29.9C  Wrist    3.1 <3.6 29.1 >10 Wrist Palm 1.3 0.0    Palm    1.8 <2.0 33.9         Right Radial Anti Sensory (Base 1st Digit)  30.7C  Wrist    2.1 <3.1 22.1  Wrist Base 1st Digit 2.1 0.0    Right Ulnar Anti Sensory (5th Digit)  30.9C  Wrist    2.9 <3.7 33.3 >15.0 Wrist 5th Digit 2.9 14.0 48 >38   Motor Summary Table   Stim Site NR Onset (ms) Norm Onset (ms) O-P Amp (mV) Norm O-P Amp Site1 Site2 Delta-0 (ms) Dist (cm) Vel (m/s) Norm Vel (m/s)  Right Median Motor (Abd Poll Brev)  30.9C  Wrist    3.2 <4.2 5.8 >5 Elbow Wrist 4.4 22.5 51 >50  Elbow    7.6  4.4         Right Ulnar Motor (Abd Dig Min)   31.1C  Wrist    3.3 <4.2 9.9 >3 B Elbow Wrist 3.6 21.5 60 >53  B Elbow    6.9  9.4  A Elbow B Elbow 1.1 10.0 91 >53  A Elbow    8.0  9.6          EMG   Side Muscle Nerve Root Ins Act Fibs Psw Amp Dur Poly Recrt Int Dennie Bible Comment  Right Abd Poll Brev Median C8-T1 Nml Nml Nml Nml Nml 0 Nml Nml   Right 1stDorInt Ulnar C8-T1 Nml Nml Nml Nml Nml 0 Nml Nml   Right PronatorTeres Median C6-7 Nml Nml Nml Nml Nml 0 Nml Nml   Right Biceps Musculocut C5-6 Nml Nml Nml Nml Nml 0 Nml Nml   Right Deltoid Axillary C5-6 Nml Nml Nml Nml Nml 0 Nml Nml     Nerve Conduction Studies Anti Sensory Left/Right Comparison   Stim Site L Lat (ms) R Lat (ms) L-R Lat (ms) L Amp (V) R Amp (V) L-R Amp (%) Site1 Site2 L Vel (m/s) R Vel (m/s) L-R Vel (m/s)  Median Acr Palm Anti Sensory (2nd Digit)  29.9C  Wrist  3.1   29.1  Wrist Palm     Palm  1.8   33.9        Radial Anti Sensory (Base 1st Digit)  30.7C  Wrist  2.1  22.1  Wrist Base 1st Digit     Ulnar Anti Sensory (5th Digit)  30.9C  Wrist  2.9   33.3  Wrist 5th Digit  48    Motor Left/Right Comparison   Stim Site L Lat (ms) R Lat (ms) L-R Lat (ms) L Amp (mV) R Amp (mV) L-R Amp (%) Site1 Site2 L Vel (m/s) R Vel (m/s) L-R Vel (m/s)  Median Motor (Abd Poll Brev)  30.9C  Wrist  3.2   5.8  Elbow Wrist  51   Elbow  7.6   4.4        Ulnar Motor (Abd Dig Min)  31.1C  Wrist  3.3   9.9  B Elbow Wrist  60   B Elbow  6.9   9.4  A Elbow B Elbow  91   A Elbow  8.0   9.6           Waveforms:

## 2023-06-23 NOTE — Progress Notes (Signed)
Clinton Fisher - 32 y.o. male MRN [id number]  Date of birth: [birth year]  Office Visit Note: Visit Date: 06/15/2023 PCP: Hilbert Bible, FNP Referred by: Samuella Cota, MD  Subjective: Chief Complaint  Patient presents with   Right Wrist - Pain, Numbness   Right Hand - Pain, Numbness   HPI:  Clinton Fisher is a 32 y.o. male who comes in today at the request of Dr. Bonner Puna for evaluation and management of chronic, worsening and severe pain, numbness and tingling in the Right upper extremities.  Patient is Right hand dominant.  He reports chronic worsening right hand pain particularly ulnar-sided hand pain with some paresthesias perhaps more ulnar-sided but somewhat nondermatomal and global.  Starting his symptoms on the left at times.  No prior electrodiagnostic study.   I spent more than 30 minutes speaking face-to-face with the patient with 50% of the time in counseling and discussing coordination of care.    ROS Otherwise per HPI.  Assessment & Plan: Visit Diagnoses:    ICD-10-CM   1. Paresthesia of skin  R20.2 NCV with EMG (electromyography)      Plan: Impression: Essentially NORMAL electrodiagnostic study of the right upper limb.  There is no significant electrodiagnostic evidence of nerve entrapment, brachial plexopathy or cervical radiculopathy.    As you know, purely sensory or demyelinating radiculopathies and chemical radiculitis may not be detected with this particular electrodiagnostic study. **This electrodiagnostic study cannot rule out small fiber polyneuropathy and dysesthesias from central pain syndromes such as stroke or central pain sensitization syndromes such as fibromyalgia.  Myotomal referral pain from trigger points is also not excluded.  Recommendations: 1.  Follow-up with referring physician. 2.  Continue current management of symptoms.  Meds & Orders: No orders of the defined types were placed in this encounter.   Orders Placed This  Encounter  Procedures   NCV with EMG (electromyography)    Follow-up: Return for Anshul Argarwala, MD.   Procedures: No procedures performed  EMG & NCV Findings: All nerve conduction studies (as indicated in the following tables) were within normal limits.    All examined muscles (as indicated in the following table) showed no evidence of electrical instability.    Impression: Essentially NORMAL electrodiagnostic study of the right upper limb.  There is no significant electrodiagnostic evidence of nerve entrapment, brachial plexopathy or cervical radiculopathy.    As you know, purely sensory or demyelinating radiculopathies and chemical radiculitis may not be detected with this particular electrodiagnostic study. **This electrodiagnostic study cannot rule out small fiber polyneuropathy and dysesthesias from central pain syndromes such as stroke or central pain sensitization syndromes such as fibromyalgia.  Myotomal referral pain from trigger points is also not excluded.  Recommendations: 1.  Follow-up with referring physician. 2.  Continue current management of symptoms.  ___________________________ Naaman Plummer FAAPMR Board Certified, American Board of Physical Medicine and Rehabilitation    Nerve Conduction Studies Anti Sensory Summary Table   Stim Site NR Peak (ms) Norm Peak (ms) P-T Amp (V) Norm P-T Amp Site1 Site2 Delta-P (ms) Dist (cm) Vel (m/s) Norm Vel (m/s)  Right Median Acr Palm Anti Sensory (2nd Digit)  29.9C  Wrist    3.1 <3.6 29.1 >10 Wrist Palm 1.3 0.0    Palm    1.8 <2.0 33.9         Right Radial Anti Sensory (Base 1st Digit)  30.7C  Wrist    2.1 <3.1 22.1  Wrist Base 1st Digit 2.1 0.0  Right Ulnar Anti Sensory (5th Digit)  30.9C  Wrist    2.9 <3.7 33.3 >15.0 Wrist 5th Digit 2.9 14.0 48 >38   Motor Summary Table   Stim Site NR Onset (ms) Norm Onset (ms) O-P Amp (mV) Norm O-P Amp Site1 Site2 Delta-0 (ms) Dist (cm) Vel (m/s) Norm Vel (m/s)  Right Median  Motor (Abd Poll Brev)  30.9C  Wrist    3.2 <4.2 5.8 >5 Elbow Wrist 4.4 22.5 51 >50  Elbow    7.6  4.4         Right Ulnar Motor (Abd Dig Min)  31.1C  Wrist    3.3 <4.2 9.9 >3 B Elbow Wrist 3.6 21.5 60 >53  B Elbow    6.9  9.4  A Elbow B Elbow 1.1 10.0 91 >53  A Elbow    8.0  9.6          EMG   Side Muscle Nerve Root Ins Act Fibs Psw Amp Dur Poly Recrt Int Dennie Bible Comment  Right Abd Poll Brev Median C8-T1 Nml Nml Nml Nml Nml 0 Nml Nml   Right 1stDorInt Ulnar C8-T1 Nml Nml Nml Nml Nml 0 Nml Nml   Right PronatorTeres Median C6-7 Nml Nml Nml Nml Nml 0 Nml Nml   Right Biceps Musculocut C5-6 Nml Nml Nml Nml Nml 0 Nml Nml   Right Deltoid Axillary C5-6 Nml Nml Nml Nml Nml 0 Nml Nml     Nerve Conduction Studies Anti Sensory Left/Right Comparison   Stim Site L Lat (ms) R Lat (ms) L-R Lat (ms) L Amp (V) R Amp (V) L-R Amp (%) Site1 Site2 L Vel (m/s) R Vel (m/s) L-R Vel (m/s)  Median Acr Palm Anti Sensory (2nd Digit)  29.9C  Wrist  3.1   29.1  Wrist Palm     Palm  1.8   33.9        Radial Anti Sensory (Base 1st Digit)  30.7C  Wrist  2.1   22.1  Wrist Base 1st Digit     Ulnar Anti Sensory (5th Digit)  30.9C  Wrist  2.9   33.3  Wrist 5th Digit  48    Motor Left/Right Comparison   Stim Site L Lat (ms) R Lat (ms) L-R Lat (ms) L Amp (mV) R Amp (mV) L-R Amp (%) Site1 Site2 L Vel (m/s) R Vel (m/s) L-R Vel (m/s)  Median Motor (Abd Poll Brev)  30.9C  Wrist  3.2   5.8  Elbow Wrist  51   Elbow  7.6   4.4        Ulnar Motor (Abd Dig Min)  31.1C  Wrist  3.3   9.9  B Elbow Wrist  60   B Elbow  6.9   9.4  A Elbow B Elbow  91   A Elbow  8.0   9.6           Waveforms:             Clinical History: RIGHT WRIST - COMPLETE 3+ VIEW   COMPARISON:  None Available.   FINDINGS: There is no evidence of fracture or dislocation. There is no evidence of arthropathy or other focal bone abnormality. Soft tissues are unremarkable.   IMPRESSION: Negative.     Electronically Signed   By: Emmaline Kluver M.D.   On: 01/24/2023 11:17     Objective:  VS:  HT:    WT:   BMI:     BP:   HR:  bpm  TEMP: ( )  RESP:  Physical Exam Musculoskeletal:        General: No tenderness.     Comments: Inspection reveals no atrophy of the bilateral APB or FDI or hand intrinsics. There is no swelling, color changes, allodynia or dystrophic changes. There is 5 out of 5 strength in the bilateral wrist extension, finger abduction and long finger flexion. There is intact sensation to light touch in all dermatomal and peripheral nerve distributions. There is a negative Froment's test bilaterally. There is a negative Tinel's test at the bilateral wrist and elbow. There is a negative Phalen's test bilaterally. There is a negative Hoffmann's test bilaterally.  Skin:    General: Skin is warm and dry.     Findings: No erythema or rash.  Neurological:     General: No focal deficit present.     Mental Status: He is alert and oriented to person, place, and time.     Sensory: No sensory deficit.     Motor: No weakness or abnormal muscle tone.     Coordination: Coordination normal.     Gait: Gait normal.  Psychiatric:        Mood and Affect: Mood normal.        Behavior: Behavior normal.        Thought Content: Thought content normal.      Imaging: No results found.

## 2023-06-25 ENCOUNTER — Other Ambulatory Visit: Payer: Self-pay | Admitting: Podiatry

## 2023-06-25 DIAGNOSIS — D492 Neoplasm of unspecified behavior of bone, soft tissue, and skin: Secondary | ICD-10-CM | POA: Diagnosis not present

## 2023-06-25 MED ORDER — OXYCODONE-ACETAMINOPHEN 5-325 MG PO TABS
1.0000 | ORAL_TABLET | ORAL | 0 refills | Status: DC | PRN
Start: 2023-06-25 — End: 2023-07-12

## 2023-06-25 MED ORDER — IBUPROFEN 800 MG PO TABS: 800.0000 mg | ORAL_TABLET | Freq: Four times a day (QID) | ORAL | 1 refills | Status: DC | PRN

## 2023-07-02 ENCOUNTER — Ambulatory Visit (INDEPENDENT_AMBULATORY_CARE_PROVIDER_SITE_OTHER): Payer: MEDICAID | Admitting: Orthopedic Surgery

## 2023-07-02 DIAGNOSIS — M778 Other enthesopathies, not elsewhere classified: Secondary | ICD-10-CM

## 2023-07-02 NOTE — Progress Notes (Signed)
Clinton Fisher - 32 y.o. male MRN [id number]  Date of birth: 05/03/1991  Office Visit Note: Visit Date: 07/02/2023 PCP: Hilbert Bible, FNP Referred by: Hilbert Bible, *  Subjective: No chief complaint on file.  HPI: Clinton Fisher is a pleasant 32 y.o. male who presents today for evaluation of ongoing right ulnar-sided wrist pain with associated numbness and tingling of the ulnar-sided digits of the right hand.  He underwent EMG as instructed, which yielded normal results.  Still having ongoing ulnar-sided wrist pain which is relatively unchanged.  For the ulnar-sided wrist pain, he was diagnosed with ECU tendinitis and has undergone prior bracing and cortisone injection without lasting relief.  Pertinent ROS were reviewed with the patient and found to be negative unless otherwise specified above in HPI.     Assessment & Plan: Visit Diagnoses: No diagnosis found.  Plan: Right upper extremity EMG was reviewed in detail today, which does not show any significant nerve compression.  For the ongoing ulnar-sided wrist pain, I have recommended he try a wrist widget at this juncture.  He still is demonstrating some ongoing ECU tendinitis which is remaining refractory to conservative care at this juncture.  We will see how the wrist widget brace helps, I will have him return in approximately 6 to 8 weeks for a recheck.  Follow-up: Return in about 8 weeks (around 08/27/2023).   Meds & Orders: No orders of the defined types were placed in this encounter.  No orders of the defined types were placed in this encounter.    Procedures: No procedures performed      Clinical History: RIGHT WRIST - COMPLETE 3+ VIEW   COMPARISON:  None Available.   FINDINGS: There is no evidence of fracture or dislocation. There is no evidence of arthropathy or other focal bone abnormality. Soft tissues are unremarkable.   IMPRESSION: Negative.     Electronically Signed   By: Emmaline Kluver M.D.   On: 01/24/2023 11:17  He reports that he has been smoking e-cigarettes. He has been exposed to tobacco smoke. He has quit using smokeless tobacco.  Recent Labs    02/21/23 0822  HGBA1C 5.5    Objective:   Vital Signs: There were no vitals taken for this visit.  Physical Exam  Gen: Well-appearing, in no acute distress; non-toxic CV: Regular Rate. Well-perfused. Warm.  Resp: Breathing unlabored on room air; no wheezing. Psych: Fluid speech in conversation; appropriate affect; normal thought process  Ortho Exam PHYSICAL EXAM:  General: Patient is well appearing and in no distress. Cervical spine mobility is full in all directions:  Skin and Muscle: No skin changes are apparent to upper extremities.  Muscle bulk and contour normal, no signs of atrophy.     Range of Motion and Palpation Tests: Mobility is full about the elbows with flexion and extension.  Forearm supination and pronation are 85/85 bilaterally.  Wrist flexion/extension is 75/65 bilaterally.  Digital flexion and extension are full.  Thumb opposition is full to the base of the small fingers bilaterally.    No cords or nodules are palpated.  No triggering is observed.    No tenderness over the thumb CMC articulations is observed.  Scaphoid shift test is negative bilaterally.  Finklestein test is negative bilaterally.  Ulnar impingement test is negative bilaterally.  No evidence of radiocarpal, midcarpal or intercarpal joint instability with provocation.    Synergy test positive right side, no evidence of ECU instability  Neurologic, Vascular,  Motor: Sensation is intact to light touch in the median/radial, slightly diminished in the right ulnar distributions.  Tinel's testing positive at right cubital tunnel, negative left cubital and carpal tunnel.   Phalen's negative, Derkan's compression negative bilaterally  Fingers pink and well perfused.  Capillary refill is brisk.      Lab Results   Component Value Date   HGBA1C 5.5 02/21/2023     Imaging: No results found.  Past Medical/Family/Surgical/Social History: Medications & Allergies reviewed per EMR, new medications updated. Patient Active Problem List   Diagnosis Date Noted   Class 1 obesity due to excess calories without serious comorbidity with body mass index (BMI) of 31.0 to 31.9 in adult 02/28/2023   Scrotal sebaceous cyst 11/09/2022   Tendinitis of flexor tendon of both hands 07/19/2022   Acute left-sided low back pain without sciatica 07/19/2022   Hemorrhoids 02/25/2021   High risk medication use 02/25/2021   Chronic pain of right knee 01/11/2021   Encounter for medical examination to establish care 01/11/2021   Bipolar 1 disorder (HCC) 10/18/2018   Tobacco use disorder 08/20/2017   GAD (generalized anxiety disorder) 09/25/2016   Past Medical History:  Diagnosis Date   Abrasion of index finger 07/29/2013   left   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   Manic affective disorder with recurrent episode (HCC)    Seasonal allergies    sore throat 07/29/2013   Skin problem 12/07/2021   Family History  Problem Relation Age of Onset   Depression Mother    Colon polyps Mother    Past Surgical History:  Procedure Laterality Date   FOOT FOREIGN BODY REMOVAL Right 01/19/2011   KNEE ARTHROSCOPY Right 08/01/2013   Procedure: ARTHROSCOPY RIGHT KNEE, Chondroplasty, Excision of Plica;  Surgeon: Harvie Junior, MD;  Location: [facility];  Service: Orthopedics;  Laterality: Right;   SCROTAL EXPLORATION N/A 11/09/2022   Procedure: SCROTUM EXPLORATION-excision of scrotal sebaceous cyst;  Surgeon: Malen Gauze, MD;  Location: [facility];  Service: Urology;  Laterality: N/A;   Social History   Occupational History   Not on file  Tobacco Use   Smoking status: Every Day    Types: E-cigarettes    Passive  exposure: Current   Smokeless tobacco: Former  Building services engineer status: Every Day  Substance and Sexual Activity   Alcohol use: Yes    Comment: every 3 days   Drug use: No   Sexual activity: Not Currently    Raymonde Hamblin Fara Boros) Denese Killings, M.D. [facility] 9:47 PM

## 2023-07-04 ENCOUNTER — Encounter: Payer: Self-pay | Admitting: Podiatry

## 2023-07-04 ENCOUNTER — Ambulatory Visit (INDEPENDENT_AMBULATORY_CARE_PROVIDER_SITE_OTHER): Payer: MEDICAID | Admitting: Podiatry

## 2023-07-04 DIAGNOSIS — Z9889 Other specified postprocedural states: Secondary | ICD-10-CM

## 2023-07-04 NOTE — Progress Notes (Signed)
  Subjective:  Patient ID: Clinton Fisher, male    DOB: May 12, 1991,  MRN: [id number]  Chief Complaint  Patient presents with   Routine Post Op    POV # 1 DOS 06/25/23 --- RIGHT FOOT EXC OF BENIGN SKIN LESION, he reports he is weaning himself off of his pain medication .    DOS: 06/25/2023 Procedure: Right excision of benign skin lesion  32 y.o. male returns for post-op check.  He states is doing well pain is controlled.  He is weaning himself off of pain medication pain scale is 5 out of 10 dull aching nature  Review of Systems: Negative except as noted in the HPI. Denies N/V/F/Ch.  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   Manic affective disorder with recurrent episode (HCC)    Seasonal allergies    sore throat 07/29/2013   Skin problem 12/07/2021    Current Outpatient Medications:    acetaminophen (TYLENOL) 500 MG tablet, Take 500 mg by mouth every 6 (six) hours as needed for mild pain or moderate pain., Disp: , Rfl:    clobetasol (TEMOVATE) 0.05 % external solution, Apply 1 Application topically as needed (apply as needed to scalp)., Disp: , Rfl:    fluticasone (CUTIVATE) 0.05 % cream, Apply 1 Application topically as needed (apply as needed to face)., Disp: , Rfl:    ibuprofen (ADVIL) 800 MG tablet, Take 1 tablet (800 mg total) by mouth every 6 (six) hours as needed., Disp: 60 tablet, Rfl: 1   oxyCODONE-acetaminophen (PERCOCET) 5-325 MG tablet, Take 1 tablet by mouth every 4 (four) hours as needed for severe pain (pain score 7-10)., Disp: 30 tablet, Rfl: 0  Social History   Tobacco Use  Smoking Status Every Day   Types: E-cigarettes   Passive exposure: Current  Smokeless Tobacco Former    Allergies  Allergen Reactions   Trazodone And Nefazodone Other (See Comments)    priapism    Amoxicillin Diarrhea   Amoxicillin-Pot Clavulanate Diarrhea    Diarrhea    Clavulanic Acid Other (See Comments)    Unknown   Objective:  There were no vitals filed for this visit. There is no height or weight on file to calculate BMI. Constitutional Well developed. Well nourished.  Vascular Foot warm and well perfused. Capillary refill normal to all digits.   Neurologic Normal speech. Oriented to person, place, and time. Epicritic sensation to light touch grossly present bilaterally.  Dermatologic Skin healing well without signs of infection. Skin edges well coapted without signs of infection.  Orthopedic: Tenderness to palpation noted about the surgical site.   Radiographs: None Assessment:   1. Status post foot surgery    Plan:  Patient was evaluated and treated and all questions answered.  S/p foot surgery right -Progressing as expected post-operatively. -XR: See above -WB Status: Partial weightbearing to the heel in surgical shoe -Sutures: Intact.  No clinical signs of Deis and no no complication noted. -Medications: None -Foot redressed.  No follow-ups on file.

## 2023-07-05 ENCOUNTER — Other Ambulatory Visit: Payer: Self-pay

## 2023-07-05 ENCOUNTER — Encounter (HOSPITAL_COMMUNITY)
Admission: RE | Admit: 2023-07-05 | Discharge: 2023-07-05 | Disposition: A | Discharge status: Home / Self Care | Payer: MEDICAID | Source: Ambulatory Visit | Attending: Urology | Admitting: Urology

## 2023-07-05 ENCOUNTER — Telehealth (HOSPITAL_COMMUNITY): Payer: MEDICAID

## 2023-07-05 ENCOUNTER — Encounter (HOSPITAL_COMMUNITY): Payer: Self-pay

## 2023-07-09 ENCOUNTER — Ambulatory Visit (INDEPENDENT_AMBULATORY_CARE_PROVIDER_SITE_OTHER): Payer: MEDICAID | Admitting: Podiatry

## 2023-07-09 DIAGNOSIS — Z9889 Other specified postprocedural states: Secondary | ICD-10-CM

## 2023-07-09 NOTE — Progress Notes (Signed)
  Subjective:  Patient ID: Clinton Fisher, male    DOB: [birth year],  MRN: [id number]  No chief complaint on file.   DOS: 06/25/23  Procedure: Right foot excision of benign skin lesion   32 y.o. male returns for POV and concern for dried blood on his outer dressing. Wanted to have it checked.   Review of Systems: Negative except as noted in the HPI. Denies N/V/F/Ch.  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   Manic affective disorder with recurrent episode (HCC)    Seasonal allergies    sore throat 07/29/2013   Skin problem 12/07/2021    Current Outpatient Medications:    acetaminophen (TYLENOL) 500 MG tablet, Take 500 mg by mouth every 6 (six) hours as needed for mild pain or moderate pain., Disp: , Rfl:    clobetasol (TEMOVATE) 0.05 % external solution, Apply 1 Application topically as needed (apply as needed to scalp)., Disp: , Rfl:    fluticasone (CUTIVATE) 0.05 % cream, Apply 1 Application topically as needed (apply as needed to face)., Disp: , Rfl:    ibuprofen (ADVIL) 800 MG tablet, Take 1 tablet (800 mg total) by mouth every 6 (six) hours as needed., Disp: 60 tablet, Rfl: 1   oxyCODONE-acetaminophen (PERCOCET) 5-325 MG tablet, Take 1 tablet by mouth every 4 (four) hours as needed for severe pain (pain score 7-10)., Disp: 30 tablet, Rfl: 0  Social History   Tobacco Use  Smoking Status Every Day   Types: E-cigarettes   Passive exposure: Current  Smokeless Tobacco Former    Allergies  Allergen Reactions   Trazodone And Nefazodone Other (See Comments)    priapism   Amoxicillin Diarrhea   Amoxicillin-Pot Clavulanate Diarrhea    Diarrhea    Clavulanic Acid Other (See Comments)    Unknown   Objective:  There were no vitals filed for this visit. There is no height or weight on file to  calculate BMI. Constitutional Well developed. Well nourished.  Vascular Foot warm and well perfused. Capillary refill normal to all digits.   Neurologic Normal speech. Oriented to person, place, and time. Epicritic sensation to light touch grossly present bilaterally.  Dermatologic Skin healing well without signs of infection. Skin edges well coapted without signs of infection.  Orthopedic: Tenderness to palpation noted about the surgical site.    Assessment:   1. Post-operative state    Plan:  Patient was evaluated and treated and all questions answered.  S/p foot surgery right -Progressing as expected post-operatively. -WB Status: WBAT in surgical shoe -Sutures: intact. -Medications: n/a -Foot redressed.  Return as scheduled for suture removal.   No follow-ups on file.

## 2023-07-12 ENCOUNTER — Encounter (HOSPITAL_COMMUNITY)
Admission: RE | Disposition: A | Discharge status: Home / Self Care | Payer: Self-pay | Source: Home / Self Care | Attending: Urology

## 2023-07-12 ENCOUNTER — Ambulatory Visit (HOSPITAL_COMMUNITY): Payer: MEDICAID | Admitting: Anesthesiology

## 2023-07-12 ENCOUNTER — Other Ambulatory Visit: Payer: Self-pay

## 2023-07-12 ENCOUNTER — Encounter (HOSPITAL_COMMUNITY): Payer: Self-pay | Admitting: Urology

## 2023-07-12 ENCOUNTER — Ambulatory Visit (HOSPITAL_COMMUNITY)
Admission: RE | Admit: 2023-07-12 | Discharge: 2023-07-12 | Disposition: A | Discharge status: Home / Self Care | Payer: MEDICAID | Attending: Urology | Admitting: Urology

## 2023-07-12 ENCOUNTER — Ambulatory Visit (HOSPITAL_BASED_OUTPATIENT_CLINIC_OR_DEPARTMENT_OTHER): Payer: MEDICAID | Admitting: Anesthesiology

## 2023-07-12 DIAGNOSIS — L720 Epidermal cyst: Secondary | ICD-10-CM

## 2023-07-12 DIAGNOSIS — F17290 Nicotine dependence, other tobacco product, uncomplicated: Secondary | ICD-10-CM | POA: Insufficient documentation

## 2023-07-12 DIAGNOSIS — N4889 Other specified disorders of penis: Secondary | ICD-10-CM

## 2023-07-12 DIAGNOSIS — L723 Sebaceous cyst: Secondary | ICD-10-CM | POA: Diagnosis not present

## 2023-07-12 HISTORY — PX: CYST EXCISION PERINEAL: SHX6278

## 2023-07-12 SURGERY — EXCISION, CYST, PERINEUM
Anesthesia: General | Site: Penis

## 2023-07-12 MED ORDER — ORAL CARE MOUTH RINSE: 15.0000 mL | Freq: Once | OROMUCOSAL | Status: AC

## 2023-07-12 MED ORDER — BACITRACIN ZINC 500 UNIT/GM EX OINT: TOPICAL_OINTMENT | CUTANEOUS | 0 refills | Status: DC

## 2023-07-12 MED ORDER — LIDOCAINE HCL (PF) 2 % IJ SOLN
INTRAMUSCULAR | Status: AC
  Filled 2023-07-12: qty 5

## 2023-07-12 MED ORDER — FENTANYL CITRATE (PF) 100 MCG/2ML IJ SOLN
INTRAMUSCULAR | Status: DC | PRN
  Administered 2023-07-12: 100 ug via INTRAVENOUS

## 2023-07-12 MED ORDER — OXYCODONE-ACETAMINOPHEN 5-325 MG PO TABS: 1.0000 | ORAL_TABLET | ORAL | 0 refills | Status: DC | PRN

## 2023-07-12 MED ORDER — CEFAZOLIN SODIUM-DEXTROSE 2-4 GM/100ML-% IV SOLN
2.0000 g | INTRAVENOUS | Status: AC
  Administered 2023-07-12: 2 g via INTRAVENOUS

## 2023-07-12 MED ORDER — 0.9 % SODIUM CHLORIDE (POUR BTL) OPTIME
TOPICAL | Status: DC | PRN
  Administered 2023-07-12: 1000 mL

## 2023-07-12 MED ORDER — KETOROLAC TROMETHAMINE 30 MG/ML IJ SOLN
INTRAMUSCULAR | Status: DC | PRN
  Administered 2023-07-12: 30 mg via INTRAVENOUS

## 2023-07-12 MED ORDER — PROPOFOL 10 MG/ML IV BOLUS
INTRAVENOUS | Status: AC
  Filled 2023-07-12: qty 20

## 2023-07-12 MED ORDER — FENTANYL CITRATE (PF) 100 MCG/2ML IJ SOLN
INTRAMUSCULAR | Status: AC
  Filled 2023-07-12: qty 2

## 2023-07-12 MED ORDER — DEXAMETHASONE SODIUM PHOSPHATE 10 MG/ML IJ SOLN
INTRAMUSCULAR | Status: DC | PRN
  Administered 2023-07-12: 10 mg via INTRAVENOUS

## 2023-07-12 MED ORDER — SULFAMETHOXAZOLE-TRIMETHOPRIM 800-160 MG PO TABS: 1.0000 | ORAL_TABLET | Freq: Two times a day (BID) | ORAL | 0 refills | Status: DC

## 2023-07-12 MED ORDER — CHLORHEXIDINE GLUCONATE 0.12 % MT SOLN: 15.0000 mL | Freq: Once | OROMUCOSAL | Status: AC

## 2023-07-12 MED ORDER — PROPOFOL 10 MG/ML IV BOLUS
INTRAVENOUS | Status: DC | PRN
  Administered 2023-07-12: 200 mg via INTRAVENOUS

## 2023-07-12 MED ORDER — CHLORHEXIDINE GLUCONATE 0.12 % MT SOLN
OROMUCOSAL | Status: AC
  Administered 2023-07-12: 15 mL via OROMUCOSAL
  Filled 2023-07-12: qty 15

## 2023-07-12 MED ORDER — KETOROLAC TROMETHAMINE 30 MG/ML IJ SOLN
INTRAMUSCULAR | Status: AC
  Filled 2023-07-12: qty 1

## 2023-07-12 MED ORDER — CEFAZOLIN SODIUM-DEXTROSE 2-4 GM/100ML-% IV SOLN
INTRAVENOUS | Status: AC
  Filled 2023-07-12: qty 100

## 2023-07-12 MED ORDER — ONDANSETRON HCL 4 MG/2ML IJ SOLN
INTRAMUSCULAR | Status: DC | PRN
  Administered 2023-07-12: 4 mg via INTRAVENOUS

## 2023-07-12 MED ORDER — MIDAZOLAM HCL 5 MG/5ML IJ SOLN
INTRAMUSCULAR | Status: DC | PRN
  Administered 2023-07-12: 2 mg via INTRAVENOUS

## 2023-07-12 MED ORDER — LACTATED RINGERS IV SOLN: INTRAVENOUS | Status: DC

## 2023-07-12 MED ORDER — ROCURONIUM BROMIDE 10 MG/ML (PF) SYRINGE
PREFILLED_SYRINGE | INTRAVENOUS | Status: AC
  Filled 2023-07-12: qty 30

## 2023-07-12 MED ORDER — MIDAZOLAM HCL 2 MG/2ML IJ SOLN
INTRAMUSCULAR | Status: AC
  Filled 2023-07-12: qty 2

## 2023-07-12 MED ORDER — ONDANSETRON HCL 4 MG/2ML IJ SOLN
INTRAMUSCULAR | Status: AC
  Filled 2023-07-12: qty 8

## 2023-07-12 MED ORDER — DEXAMETHASONE SODIUM PHOSPHATE 10 MG/ML IJ SOLN
INTRAMUSCULAR | Status: AC
  Filled 2023-07-12: qty 3

## 2023-07-12 MED ORDER — BUPIVACAINE HCL (PF) 0.25 % IJ SOLN
INTRAMUSCULAR | Status: AC
  Filled 2023-07-12: qty 30

## 2023-07-12 MED ORDER — BUPIVACAINE HCL 0.25 % IJ SOLN
INTRAMUSCULAR | Status: DC | PRN
  Administered 2023-07-12: 10 mL

## 2023-07-12 SURGICAL SUPPLY — 22 items
COVER LIGHT HANDLE STERIS (MISCELLANEOUS) ×4 IMPLANT
DERMABOND ADVANCED .7 DNX12 (GAUZE/BANDAGES/DRESSINGS) ×1 IMPLANT
ELECT REM PT RETURN 9FT ADLT (ELECTROSURGICAL) ×2
ELECTRODE REM PT RTRN 9FT ADLT (ELECTROSURGICAL) ×1 IMPLANT
GAUZE SPONGE 4X4 12PLY STRL (GAUZE/BANDAGES/DRESSINGS) ×2 IMPLANT
GLOVE BIO SURGEON STRL SZ8 (GLOVE) ×2 IMPLANT
GLOVE BIOGEL PI IND STRL 7.0 (GLOVE) ×4 IMPLANT
GLOVE BIOGEL PI IND STRL 8 (GLOVE) ×2 IMPLANT
GOWN STRL REUS W/TWL LRG LVL3 (GOWN DISPOSABLE) ×2 IMPLANT
GOWN STRL REUS W/TWL XL LVL3 (GOWN DISPOSABLE) ×2 IMPLANT
KIT TURNOVER KIT A (KITS) ×2 IMPLANT
MANIFOLD NEPTUNE II (INSTRUMENTS) ×2 IMPLANT
NDL HYPO 25X1 1.5 SAFETY (NEEDLE) ×1 IMPLANT
NEEDLE HYPO 25X1 1.5 SAFETY (NEEDLE) ×2
PACK MINOR (CUSTOM PROCEDURE TRAY) ×2 IMPLANT
PAD ARMBOARD 7.5X6 YLW CONV (MISCELLANEOUS) ×2 IMPLANT
POSITIONER HEAD 8X9X4 ADT (SOFTGOODS) ×2 IMPLANT
SET BASIN LINEN APH (SET/KITS/TRAYS/PACK) ×2 IMPLANT
SOL PREP POV-IOD 4OZ 10% (MISCELLANEOUS) ×2 IMPLANT
SOL PREP PROV IODINE SCRUB 4OZ (MISCELLANEOUS) ×2 IMPLANT
SUT VIC AB 3-0 SH 27X BRD (SUTURE) ×2 IMPLANT
SYR CONTROL 10ML LL (SYRINGE) ×2 IMPLANT

## 2023-07-12 NOTE — Anesthesia Procedure Notes (Signed)
Procedure Name: LMA Insertion Date/Time: 07/12/2023 1:39 PM  Performed by: Shanon Payor, CRNAPre-anesthesia Checklist: Patient identified, Emergency Drugs available, Suction available, Patient being monitored and Timeout performed Patient Re-evaluated:Patient Re-evaluated prior to induction Oxygen Delivery Method: Circle system utilized Preoxygenation: Pre-oxygenation with 100% oxygen Induction Type: IV induction LMA: LMA inserted LMA Size: 4.0 Number of attempts: 1 Placement Confirmation: positive ETCO2, CO2 detector and breath sounds checked- equal and bilateral Tube secured with: Tape Dental Injury: Teeth and Oropharynx as per pre-operative assessment

## 2023-07-12 NOTE — Transfer of Care (Signed)
Immediate Anesthesia Transfer of Care Note  Patient: Clinton Fisher  Procedure(s) Performed: SCROTUM EXPLORATION-excision of penile sebaceous cyst (Penis)  Patient Location: PACU  Anesthesia Type:General  Level of Consciousness: awake, alert , and patient cooperative  Airway & Oxygen Therapy: Patient Spontanous Breathing  Post-op Assessment: Report given to RN, Post -op Vital signs reviewed and stable, and Patient moving all extremities X 4  Post vital signs: Reviewed and stable  Last Vitals:  Vitals Value Taken Time  BP 116/86 07/12/23 1415  Temp 36.5 C 07/12/23 1414  Pulse 64 07/12/23 1418  Resp 15 07/12/23 1418  SpO2 94 % 07/12/23 1418  Vitals shown include unfiled device data.  Last Pain:  Vitals:   07/12/23 1234  TempSrc: Oral  PainSc: 0-No pain      Patients Stated Pain Goal: 10 (07/12/23 1234)  Complications: No notable events documented.

## 2023-07-12 NOTE — Op Note (Signed)
Preoperative diagnosis: penile cyst   Postoperative diagnosis: Same   Procedure: 1. Excision of penile cyst   Attending: Wilkie Aye, MD   Anesthesia: General   History of blood loss: Minimal   Antibiotics: ancef   Drains: none   Specimens: 1. proximal penile shaft cyst     Findings: 2cm penile cyst at penoscrotal junction   Indications: Patient is a 32 year old male with a history of a penile sebaceous cyst that was growing in size and causing him pain.  We discussed the treatment options including observation versus excision after discussing treatment options he proceed with excision.    Procedure in detail: Prior to procedure consent was obtained.  Patient was brought to the operating room and a brief timeout was done to ensure correct patient, correct procedure, correct site.  General anesthesia was administered and patient was placed in supine position.  His genitalia was then prepped and draped in usual sterile fashion.  A 3 cm elliptical incision was made around the penile cyst.  We dissected under the sebaceous cyst and removed the surrounding scrotal skin. We then obtained hemostasis with electrocautery. . The skin was then closed with 3-0 vicryl in a running fashion. A dressing was then applied to the incision.  We then placed a scrotal fluff and this then concluded the procedure which was well tolerated by the patient.   Complications: None   Condition: Stable, extubated, transferred to PACU.   Plan: Patient is to be discharged home.  He is to follow up in 2 weeks for wound check

## 2023-07-12 NOTE — Anesthesia Preprocedure Evaluation (Signed)
Anesthesia Evaluation  Patient identified by MRN, date of birth, ID band Patient awake    Reviewed: Allergy & Precautions, H&P , NPO status , Patient's Chart, lab work & pertinent test results  Airway Mallampati: I  TM Distance: >3 FB Neck ROM: Full    Dental  (+) Dental Advisory Given, Teeth Intact   Pulmonary Current Smoker and Patient abstained from smoking.   Pulmonary exam normal breath sounds clear to auscultation       Cardiovascular negative cardio ROS Normal cardiovascular exam Rhythm:Regular Rate:Normal     Neuro/Psych  PSYCHIATRIC DISORDERS Anxiety  Bipolar Disorder   negative neurological ROS     GI/Hepatic Neg liver ROS,GERD  Medicated and Controlled,,  Endo/Other  negative endocrine ROS    Renal/GU negative Renal ROS  negative genitourinary   Musculoskeletal  (+) Arthritis , Osteoarthritis,    Abdominal   Peds negative pediatric ROS (+)  Hematology negative hematology ROS (+)   Anesthesia Other Findings Acute left-sided low back pain without sciatica  Reproductive/Obstetrics negative OB ROS                             Anesthesia Physical Anesthesia Plan  ASA: 2  Anesthesia Plan: General   Post-op Pain Management: Dilaudid IV   Induction: Intravenous  PONV Risk Score and Plan: 3 and Ondansetron, Dexamethasone and Midazolam  Airway Management Planned: LMA  Additional Equipment:   Intra-op Plan:   Post-operative Plan: Extubation in OR  Informed Consent: I have reviewed the patients History and Physical, chart, labs and discussed the procedure including the risks, benefits and alternatives for the proposed anesthesia with the patient or authorized representative who has indicated his/her understanding and acceptance.     Dental advisory given  Plan Discussed with: CRNA and Surgeon  Anesthesia Plan Comments:         Anesthesia Quick Evaluation

## 2023-07-12 NOTE — H&P (Signed)
HPI: Clinton Fisher is a 32yo here for followup for a scrotal sebaceous cyst. He formed a stitch abscess post op which since drained and the incision healed without issue. He was removing hairs on his scrotum and developed an cyst. 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   Manic affective disorder with recurrent episode (HCC)     Seasonal allergies      sore throat 07/29/2013   Skin problem 12/07/2021          Surgical History:      Past Surgical History:  Procedure Laterality Date   FOOT FOREIGN BODY REMOVAL Right 01/19/2011   KNEE ARTHROSCOPY Right 08/01/2013    Procedure: ARTHROSCOPY RIGHT KNEE, Chondroplasty, Excision of Plica;  Surgeon: Harvie Junior, MD;  Location: [facility];  Service: Orthopedics;  Laterality: Right;   SCROTAL EXPLORATION N/A 11/09/2022    Procedure: SCROTUM EXPLORATION-excision of scrotal sebaceous cyst;  Surgeon: Malen Gauze, MD;  Location: [facility];  Service: Urology;  Laterality: N/A;          Home Medications:  Allergies as of 06/08/2023         Reactions    Trazodone And Nefazodone Other (See Comments)    priapism    Amoxicillin Diarrhea    Amoxicillin-pot Clavulanate Diarrhea    Diarrhea    Clavulanic Acid Other (See Comments)    Unknown            Medication List           Accurate as of June 08, 2023 11:41 AM. If you have any questions, ask your nurse or doctor.              acetaminophen 500 MG tablet Commonly known as: TYLENOL Take 500 mg by mouth every 6 (six) hours as needed for mild pain or moderate pain.    clobetasol 0.05 % external solution Commonly known as: TEMOVATE Apply 1 Application topically as needed (apply as needed to scalp).    fluticasone 0.05 %  cream Commonly known as: CUTIVATE Apply 1 Application topically as needed (apply as needed to face).             Allergies:  Allergies       Allergies  Allergen Reactions   Trazodone And Nefazodone Other (See Comments)      priapism   Amoxicillin Diarrhea   Amoxicillin-Pot Clavulanate Diarrhea      Diarrhea     Clavulanic Acid Other (See Comments)      Unknown        Family History:      Family History  Problem Relation Age of Onset   Depression Mother     Colon polyps Mother            Social History:  reports that he has been smoking e-cigarettes. He has been exposed to tobacco smoke. He has quit using smokeless tobacco. He reports current alcohol use. He reports that he does not use drugs.   ROS: All other review of systems were reviewed and are negative except what is noted above in HPI   Physical Exam: BP 125/85   Pulse 94  Constitutional:  Alert and oriented, No acute distress. HEENT: [place] AT, moist mucus membranes.  Trachea midline, no masses. Cardiovascular: No clubbing, cyanosis, or edema. Respiratory: Normal respiratory effort, no increased work of breathing. GI: Abdomen is soft, nontender, nondistended, no abdominal masses GU: No CVA tenderness. 1.5cm left proximal penile shaft epidermoid cyst.  Lymph: No cervical or inguinal lymphadenopathy. Skin: No rashes, bruises or suspicious lesions. Neurologic: Grossly intact, no focal deficits, moving all 4 extremities. Psychiatric: Normal mood and affect.   Laboratory Data: Recent Labs       Lab Results  Component Value Date    WBC 6.4 04/11/2023    HGB 16.5 04/11/2023    HCT 48.3 04/11/2023    MCV 88.0 04/11/2023    PLT 272 04/11/2023        Recent Labs       Lab Results  Component Value Date    CREATININE 0.75 04/11/2023        Recent Labs  No results found for: "PSA"     Recent Labs  No results found for: "TESTOSTERONE"     Recent Labs       Lab Results  Component Value Date     HGBA1C 5.5 02/21/2023        Urinalysis Labs (Brief)          Component Value Date/Time    COLORURINE YELLOW 11/13/2022 2015    APPEARANCEUR CLEAR 11/13/2022 2015    APPEARANCEUR Clear 09/22/2022 0948    LABSPEC 1.032 (H) 11/13/2022 2015    PHURINE 5.5 11/13/2022 2015    GLUCOSEU NEGATIVE 11/13/2022 2015    HGBUR SMALL (A) 11/13/2022 2015    BILIRUBINUR NEGATIVE 11/13/2022 2015    BILIRUBINUR Negative 09/22/2022 0948    KETONESUR NEGATIVE 11/13/2022 2015    PROTEINUR 30 (A) 11/13/2022 2015    UROBILINOGEN 0.2 10/15/2012 2158    NITRITE NEGATIVE 11/13/2022 2015    LEUKOCYTESUR NEGATIVE 11/13/2022 2015        Recent Labs       Lab Results  Component Value Date    LABMICR See below: 09/22/2022    WBCUA 0-5 09/22/2022    LABEPIT 0-10 09/22/2022    BACTERIA NONE SEEN 11/13/2022        Pertinent Imaging:   No results found for this or any previous visit.   No results found for this or any previous visit.   No results found for this or any previous visit.   No results found for this or any previous visit.   No results found for this or any previous visit.   No valid procedures specified. No results found for this or any previous visit.   No results found for this or any previous visit.     Assessment & Plan:     1. Scrotal sebaceous cyst We discussed the management including observation versus excision and the patient elects for excision. Risks/benefits/alternatives discussed

## 2023-07-13 NOTE — Progress Notes (Signed)
 Name: Clinton Fisher DOB: 1990-09-12 MRN: [id number]  Diagnoses: Post-operative state  HPI: He presents postoperatively. - GU history includes:  1. Recurrent scrotal cyst.  He underwent the following procedures by Dr. Sherrilee on 07/12/2023:  Preoperative diagnosis: penile cyst   Postoperative diagnosis: Same   Procedure: 1. Excision of penile cyst   Findings: 2cm penile cyst at penoscrotal junction  Pathology:  A. CYST, PENILE, EXCISION:       Epidermal inclusion cyst.    Postop course: Today He reports some incisional pain which he states is gradually. Reports using the Bacitracin  ointment 1-2x/day on average. He reports that the surgical glue was falling off and he trimmed the suture material that was sticking out with sterile scissors due to surface tension when he gets erections. Has increased protein intake to promote wound healing. He denies redness, warmth, tenderness, swelling, or drainage at the incision site(s). He denies fevers.  He denies increased urinary urgency, frequency, nocturia, dysuria, gross hematuria, hesitancy, straining to void, or sensations of incomplete emptying.   Fall Screening: Do you usually have a device to assist in your mobility? No   Medications: Current Outpatient Medications  Medication Sig Dispense Refill   acetaminophen  (TYLENOL ) 500 MG tablet Take 500 mg by mouth every 6 (six) hours as needed for mild pain or moderate pain.     bacitracin  ointment Apply to affected area daily 30 g 0   clobetasol (TEMOVATE) 0.05 % external solution Apply 1 Application topically as needed (apply as needed to scalp).     fluticasone  (CUTIVATE ) 0.05 % cream Apply 1 Application topically as needed (apply as needed to face).     ibuprofen  (ADVIL ) 800 MG tablet Take 1 tablet (800 mg total) by mouth every 6 (six) hours as needed. 60 tablet 1   oxyCODONE -acetaminophen  (PERCOCET) 5-325 MG tablet Take 1 tablet by mouth every 4 (four) hours as needed for  severe pain (pain score 7-10). 30 tablet 0   sulfamethoxazole -trimethoprim  (BACTRIM  DS) 800-160 MG tablet Take 1 tablet by mouth 2 (two) times daily. 14 tablet 0   No current facility-administered medications for this visit.    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   Manic affective disorder with recurrent episode (HCC)    Seasonal allergies    sore throat 07/29/2013   Skin problem 12/07/2021   Past Surgical History:  Procedure Laterality Date   CYST EXCISION PERINEAL N/A 07/12/2023   Procedure: SCROTUM EXPLORATION-excision of penile sebaceous cyst;  Surgeon: Sherrilee Belvie CROME, MD;  Location: [facility];  Service: Urology;  Laterality: N/A;   FOOT FOREIGN BODY REMOVAL Right 01/19/2011   KNEE ARTHROSCOPY Right 08/01/2013   Procedure: ARTHROSCOPY RIGHT KNEE, Chondroplasty, Excision of Plica;  Surgeon: Norleen CROME Gavel, MD;  Location: [facility];  Service: Orthopedics;  Laterality: Right;   SCROTAL EXPLORATION N/A 11/09/2022   Procedure: SCROTUM EXPLORATION-excision of scrotal sebaceous cyst;  Surgeon: Sherrilee Belvie CROME, MD;  Location: [facility];  Service: Urology;  Laterality: N/A;   Family History  Problem Relation Age of Onset   Depression Mother    Colon polyps Mother    Social History   Socioeconomic History   Marital status: Single    Spouse name: Not on file   Number of children: 1   Years of education: Not on file   Highest education level: Not on file  Occupational History   Not on file  Tobacco Use   Smoking status: Every Day    Types:  E-cigarettes    Passive exposure: Current   Smokeless tobacco: Former  Building Services Engineer status: Every Day  Substance and Sexual Activity   Alcohol use: Yes    Comment: every 3 days   Drug use: No   Sexual activity: Not Currently  Other Topics Concern   Not on file  Social History Narrative   Not on file   Social Drivers of Health   Financial Resource Strain: Not on file  Food Insecurity: Not on file  Transportation Needs: No Transportation Needs (03/09/2023)   PRAPARE - Administrator, Civil Service (Medical): No    Lack of Transportation (Non-Medical): No  Physical Activity: Not on file  Stress: Not on file  Social Connections: Unknown (11/29/2021)   Received from [facility], [facility]   Social Network    Social Network: Not on file  Intimate Partner Violence: Unknown (10/26/2021)   Received from [facility], [facility]   HITS    Physically Hurt: Not on file    Insult or Talk Down To: Not on file    Threaten Physical Harm: Not on file    Scream or Curse: Not on file    SUBJECTIVE  Review of Systems Constitutional: Patient denies any unintentional weight loss or change in strength lntegumentary: Patient denies any rashes or pruritus Cardiovascular: Patient denies chest pain or syncope Respiratory: Patient denies shortness of breath Gastrointestinal: Patient denies nausea, vomiting, constipation, or diarrhea Musculoskeletal: Patient denies muscle cramps or weakness Neurologic: Patient denies convulsions or seizures Allergic/Immunologic: Patient denies recent allergic reaction(s) Hematologic/Lymphatic: Patient denies bleeding tendencies Endocrine: Patient denies heat/cold intolerance  GU: As per HPI.  OBJECTIVE Vitals:   07/27/23 1035  BP: (!) 171/95  Pulse: (!) 112   There is no height or weight on file to calculate BMI.  Physical Examination Constitutional: No obvious distress; patient is non-toxic appearing  Cardiovascular: No  visible lower extremity edema.  Respiratory: The patient does not have audible wheezing/stridor; respirations do not appear labored  Gastrointestinal: Abdomen non-distended Musculoskeletal: Normal ROM of UEs  Skin: No obvious rashes/open sores  Neurologic: CN 2-12 grossly intact Psychiatric: Answered questions appropriately with normal affect  Hematologic/Lymphatic/Immunologic: No obvious bruises or sites of spontaneous bleeding  GU: Incision is open superficially approximately 2 cm x 2 cm with granulation tissue. No surrounding erythema, edema, crepitus, fluctuance, drainage / discharge, warmth, significant tenderness to palpation.   Chaperone offered for pelvic exam; patient declined.  ASSESSMENT Scrotal sebaceous cyst  Postop check  Penile cyst  We reviewed the operative procedures and findings. Surgical site appears to be healing adequately via secondary intention. Pain is manageable per patient. He was advised he can stop Bactracin ointment use and no other topical medications / creams are advised; recommended to cleanse daily with gentle soap and water then keep area clean and dry. Cover with 4x4 gauze to protect the area. Will plan for follow up in 2-3 weeks for repeat wound check or sooner if needed. Pt verbalized understanding and agreement.  All questions were answered.  PLAN Advised the following: Return in about 2 weeks (around 08/10/2023) for f/u with Daune Colgate, NP for repeat wound check.  No orders of the defined types were placed in this encounter.   It has been explained that the patient is to follow regularly with their PCP in addition to all other providers involved in their care and to follow instructions provided by these respective offices. Patient advised to contact urology clinic if any urologic-pertaining questions, concerns, new symptoms or problems arise in the interim period.  There are no Patient Instructions on file for this visit.  Electronically  signed by:  Lauraine KYM Oz, MSN, FNP-C, CUNP 07/27/2023 11:05 AM

## 2023-07-16 ENCOUNTER — Encounter (HOSPITAL_COMMUNITY): Payer: Self-pay | Admitting: Urology

## 2023-07-17 LAB — SURGICAL PATHOLOGY

## 2023-07-17 NOTE — Anesthesia Postprocedure Evaluation (Signed)
Anesthesia Post Note  Patient: Clinton Fisher  Procedure(s) Performed: SCROTUM EXPLORATION-excision of penile sebaceous cyst (Penis)  Patient location during evaluation: Phase II Anesthesia Type: General Level of consciousness: awake Pain management: pain level controlled Vital Signs Assessment: post-procedure vital signs reviewed and stable Respiratory status: spontaneous breathing and respiratory function stable Cardiovascular status: blood pressure returned to baseline and stable Postop Assessment: no headache and no apparent nausea or vomiting Anesthetic complications: no Comments: Late entry   No notable events documented.   Last Vitals:  Vitals:   07/12/23 1445 07/12/23 1447  BP: 112/84   Pulse: 64 65  Resp: 17 14  Temp: 36.5 C 36.5 C  SpO2: 96% 96%    Last Pain:  Vitals:   07/13/23 1533  TempSrc:   PainSc: 0-No pain                 Windell Norfolk

## 2023-07-19 ENCOUNTER — Ambulatory Visit (INDEPENDENT_AMBULATORY_CARE_PROVIDER_SITE_OTHER): Payer: MEDICAID | Admitting: Podiatry

## 2023-07-19 ENCOUNTER — Encounter: Payer: Self-pay | Admitting: Podiatry

## 2023-07-19 DIAGNOSIS — D492 Neoplasm of unspecified behavior of bone, soft tissue, and skin: Secondary | ICD-10-CM

## 2023-07-19 NOTE — Progress Notes (Signed)
Subjective: Chief Complaint  Patient presents with   Routine Post Op    RM#13 POV # 2 DOS 06/25/23 --- RIGHT FOOT EXC OF BENIGN SKIN LESION - PATEL PT- Patient states doing well no concerns at this time.   32 year old male presents the office today for above concerns.  Presents today for suture removal.  States he has been doing well.  He is still in the offloading shoe.  No fevers or chills.  Pain is controlled.  Objective: AAO x3, NAD DP/PT pulses palpable bilaterally, CRT less than 3 seconds Incision well coapted with sutures intact.  There is no sign erythema, ascending size.  No drainage or pus.  There is no significant pain on exam today.  Appears to be healing well at this time postoperatively. No pain with calf compression, swelling, warmth, erythema  Assessment: Status post soft tissue mass excision.  Plan: -All treatment options discussed with the patient including all alternatives, risks, complications.  -Patient doing well.  Sutures removed without complications.  Small amount of antibiotic ointment was applied followed by dressing.  Discussed daily dressing changes and he can wash with soap and water.  Recommend surgical shoe next couple days for scar is formed and gradually transition to regular shoe as tolerated. -Path 07/12/23: Epidermal inclusion cyst -Patient encouraged to call the office with any questions, concerns, change in symptoms.   Vivi Barrack DPM

## 2023-07-27 ENCOUNTER — Encounter: Payer: Self-pay | Admitting: Urology

## 2023-07-27 ENCOUNTER — Ambulatory Visit (INDEPENDENT_AMBULATORY_CARE_PROVIDER_SITE_OTHER): Payer: MEDICAID | Admitting: Urology

## 2023-07-27 VITALS — BP 171/95 | HR 112

## 2023-07-27 DIAGNOSIS — Z09 Encounter for follow-up examination after completed treatment for conditions other than malignant neoplasm: Secondary | ICD-10-CM | POA: Diagnosis not present

## 2023-07-27 DIAGNOSIS — N4889 Other specified disorders of penis: Secondary | ICD-10-CM

## 2023-07-27 DIAGNOSIS — Z87438 Personal history of other diseases of male genital organs: Secondary | ICD-10-CM

## 2023-07-27 DIAGNOSIS — L723 Sebaceous cyst: Secondary | ICD-10-CM

## 2023-08-03 ENCOUNTER — Ambulatory Visit
Admission: EM | Admit: 2023-08-03 | Discharge: 2023-08-03 | Disposition: A | Discharge status: Home / Self Care | Payer: MEDICAID | Attending: Family Medicine | Admitting: Family Medicine

## 2023-08-03 DIAGNOSIS — S0501XA Injury of conjunctiva and corneal abrasion without foreign body, right eye, initial encounter: Secondary | ICD-10-CM

## 2023-08-03 MED ORDER — ERYTHROMYCIN 5 MG/GM OP OINT: TOPICAL_OINTMENT | OPHTHALMIC | 0 refills | Status: DC

## 2023-08-03 NOTE — ED Provider Notes (Signed)
 [facility]    CSN: [id number] Arrival date & time: 08/03/23  1107      History   Chief Complaint No chief complaint on file.   HPI Clinton Fisher is a 33 y.o. male.   Patient presenting today with 1 day history of right eye redness, pain and irritation, light sensitivity.  Denies known injury to the eye but did just install a new fan that is much more powerful than his old man yesterday and thinks he may have gotten some dust into the eye.  Denies visual change, fevers, headache, nausea, vomiting.  Has tried over-the-counter eye washes and eyedrops with no relief.    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   Manic affective disorder with recurrent episode (HCC)    Seasonal allergies    sore throat 07/29/2013   Skin problem 12/07/2021    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   Class 1 obesity due to excess calories without serious comorbidity with body mass index (BMI) of 31.0 to 31.9 in adult 02/28/2023   Scrotal sebaceous cyst 11/09/2022   Tendinitis of flexor tendon of both hands 07/19/2022   Hemorrhoids 02/25/2021   Chronic pain of right knee 01/11/2021   Bipolar 1 disorder (HCC) 10/18/2018   Tobacco use disorder 08/20/2017   GAD (generalized anxiety disorder) 09/25/2016    Past Surgical History:  Procedure Laterality Date   CYST EXCISION PERINEAL N/A 07/12/2023   Procedure: SCROTUM EXPLORATION-excision of penile sebaceous cyst;  Surgeon: Sherrilee Belvie CROME, MD;  Location: [facility];  Service: Urology;  Laterality: N/A;   FOOT FOREIGN BODY REMOVAL Right 01/19/2011   KNEE ARTHROSCOPY Right 08/01/2013   Procedure: ARTHROSCOPY RIGHT KNEE, Chondroplasty, Excision of Plica;  Surgeon: Norleen CROME Gavel, MD;  Location: [facility];   Service: Orthopedics;  Laterality: Right;   SCROTAL EXPLORATION N/A 11/09/2022   Procedure: SCROTUM EXPLORATION-excision of scrotal sebaceous cyst;  Surgeon: Sherrilee Belvie CROME, MD;  Location: [facility];  Service: Urology;  Laterality: N/A;       Home Medications    Prior to Admission medications   Medication Sig Start Date End Date Taking? Authorizing Provider  erythromycin  ophthalmic ointment Place a 1/2 inch ribbon of ointment into the right lower eyelid BID prn. 08/03/23  Yes Stuart Vernell Norris, PA-C  acetaminophen  (TYLENOL ) 500 MG tablet Take 500 mg by mouth every 6 (six) hours as needed for mild pain or moderate pain.    [provider]  bacitracin  ointment Apply to affected area daily 07/12/23 07/11/24  McKenzie, Belvie CROME, MD  clobetasol (TEMOVATE) 0.05 % external solution Apply 1 Application topically as needed (apply as needed to scalp).    [provider]  fluticasone  (CUTIVATE ) 0.05 % cream Apply 1 Application topically as needed (apply as needed to face). 11/09/22   [provider]  ibuprofen  (ADVIL ) 800 MG tablet Take 1 tablet (800 mg total) by mouth every 6 (six) hours as needed. 06/25/23   Tobie Franky SQUIBB, DPM  oxyCODONE -acetaminophen  (PERCOCET) 5-325 MG tablet Take 1 tablet by mouth every 4 (four) hours as needed for severe pain (pain score 7-10). 07/12/23   McKenzie, Belvie CROME, MD  sulfamethoxazole -trimethoprim  (BACTRIM  DS) 800-160 MG tablet Take 1 tablet by mouth  2 (two) times daily. 07/12/23   McKenzie, Belvie CROME, MD    Family History Family History  Problem Relation Age of Onset   Depression Mother    Colon polyps Mother     Social History Social History   Tobacco Use   Smoking status: Every Day    Types: E-cigarettes    Passive exposure: Current   Smokeless tobacco: Former  Building Services Engineer status: Every Day  Substance Use Topics   Alcohol use: Yes    Comment: every 3 days   Drug use: No     Allergies   Trazodone and  nefazodone, Amoxicillin, Amoxicillin-pot clavulanate, and Clavulanic acid   Review of Systems Review of Systems Per HPI  Physical Exam Triage Vital Signs ED Triage Vitals  Encounter Vitals Group     BP 08/03/23 1111 (!) 144/92     Systolic BP Percentile --      Diastolic BP Percentile --      Pulse Rate 08/03/23 1111 96     Resp 08/03/23 1111 18     Temp 08/03/23 1111 97.6 F (36.4 C)     Temp Source 08/03/23 1111 Oral     SpO2 08/03/23 1111 97 %     Weight --      Height --      Head Circumference --      Peak Flow --      Pain Score 08/03/23 1113 7     Pain Loc --      Pain Education --      Exclude from Growth Chart --    No data found.  Updated Vital Signs BP (!) 144/92 (BP Location: Right Arm)   Pulse 96   Temp 97.6 F (36.4 C) (Oral)   Resp 18   SpO2 97%   Visual Acuity Right Eye Distance: 20/40 Left Eye Distance: 20/40 Bilateral Distance: 20/40  Right Eye Near:   Left Eye Near:    Bilateral Near:     Physical Exam Vitals and nursing note reviewed.  Constitutional:      Appearance: Normal appearance.  HENT:     Head: Atraumatic.     Mouth/Throat:     Mouth: Mucous membranes are moist.     Pharynx: Oropharynx is clear.  Eyes:     Extraocular Movements: Extraocular movements intact.     Conjunctiva/sclera: Conjunctivae normal.     Comments: Small corneal abrasion identified on fluorescein stain exam to the right eye over the iris  Cardiovascular:     Rate and Rhythm: Normal rate and regular rhythm.  Pulmonary:     Effort: Pulmonary effort is normal.     Breath sounds: Normal breath sounds.  Musculoskeletal:        General: Normal range of motion.     Cervical back: Normal range of motion and neck supple.  Skin:    General: Skin is warm and dry.  Neurological:     General: No focal deficit present.     Mental Status: He is oriented to person, place, and time.  Psychiatric:        Mood and Affect: Mood normal.        Thought Content:  Thought content normal.        Judgment: Judgment normal.      UC Treatments / Results  Labs (all labs ordered are listed, but only abnormal results are displayed) Labs Reviewed - No data to display  EKG   Radiology No results  found.  Procedures Procedures (including critical care time)  Medications Ordered in UC Medications - No data to display  Initial Impression / Assessment and Plan / UC Course  I have reviewed the triage vital signs and the nursing notes.  Pertinent labs & imaging results that were available during my care of the patient were reviewed by me and considered in my medical decision making (see chart for details).     Visual acuity declined as vision intact per patient.  Will treat for corneal abrasion with erythromycin  ointment, cool compresses, ibuprofen , lubricating drops.  Ophthalmology follow-up if not resolving.  Final Clinical Impressions(s) / UC Diagnoses   Final diagnoses:  Abrasion of right cornea, initial encounter   Discharge Instructions   None    ED Prescriptions     Medication Sig Dispense Auth. Provider   erythromycin  ophthalmic ointment Place a 1/2 inch ribbon of ointment into the right lower eyelid BID prn. 3.5 g Stuart Vernell Norris, PA-C      PDMP not reviewed this encounter.   Stuart Vernell Norris, PA-C 08/03/23 1231

## 2023-08-03 NOTE — ED Triage Notes (Signed)
 Pt reports he woke up this morning and his right eye was red, sensitivity to light, and pain.   Used eye wash

## 2023-08-10 ENCOUNTER — Ambulatory Visit: Payer: MEDICAID | Admitting: Urology

## 2023-08-13 NOTE — Progress Notes (Signed)
Name: Clinton Fisher DOB: [birth year] MRN: [id number]  History of Present Illness: Mr. Norgaard is a 33 y.o. male who presents today for follow up visit at [facility] [place].   Recent history: > 07/12/2023: Underwent excision of penile epidermal inclusion cyst by Dr. Ronne Binning.  > 07/27/2023: Postop check.  - "Incision is open superficially approximately 2 cm x 2 cm with granulation tissue"; healing adequately via secondary intention.  - Recommended cleanse daily with gentle soap and water then keep area clean and dry. Follow up in 2-3 weeks for wound check.   Today: He reports that the penoscrotal wound has fully healed. Denies redness, warmth, tenderness, swelling, or drainage at the incision site(s). He denies fevers. He denies urinary complaints.   Fall Screening: Do you usually have a device to assist in your mobility? No   Medications: Current Outpatient Medications  Medication Sig Dispense Refill   acetaminophen (TYLENOL) 500 MG tablet Take 500 mg by mouth every 6 (six) hours as needed for mild pain or moderate pain.     clobetasol (TEMOVATE) 0.05 % external solution Apply 1 Application topically as needed (apply as needed to scalp).     fluticasone (CUTIVATE) 0.05 % cream Apply 1 Application topically as needed (apply as needed to face).     ibuprofen (ADVIL) 800 MG tablet Take 1 tablet (800 mg total) by mouth every 6 (six) hours as needed. 60 tablet 1   bacitracin ointment Apply to affected area daily (Patient not taking: Reported on 08/21/2023) 30 g 0   erythromycin ophthalmic ointment Place a 1/2 inch ribbon of ointment into the right lower eyelid BID prn. (Patient not taking: Reported on 08/21/2023) 3.5 g 0   oxyCODONE-acetaminophen (PERCOCET) 5-325 MG tablet Take 1 tablet by mouth every 4 (four) hours as needed for severe pain (pain score 7-10). (Patient not taking: Reported on 08/21/2023) 30 tablet 0   sulfamethoxazole-trimethoprim (BACTRIM DS) 800-160 MG tablet  Take 1 tablet by mouth 2 (two) times daily. 14 tablet 0   No current facility-administered medications for this visit.    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   Manic affective disorder with recurrent episode (HCC)    Seasonal allergies    sore throat 07/29/2013   Skin problem 12/07/2021   Past Surgical History:  Procedure Laterality Date   CYST EXCISION PERINEAL N/A 07/12/2023   Procedure: SCROTUM EXPLORATION-excision of penile sebaceous cyst;  Surgeon: Malen Gauze, MD;  Location: [facility];  Service: Urology;  Laterality: N/A;   FOOT FOREIGN BODY REMOVAL Right 01/19/2011   KNEE ARTHROSCOPY Right 08/01/2013   Procedure: ARTHROSCOPY RIGHT KNEE, Chondroplasty, Excision of Plica;  Surgeon: Harvie Junior, MD;  Location: [facility];  Service: Orthopedics;  Laterality: Right;   SCROTAL EXPLORATION N/A 11/09/2022   Procedure: SCROTUM EXPLORATION-excision of scrotal sebaceous cyst;  Surgeon: Malen Gauze, MD;  Location: [facility];  Service: Urology;  Laterality: N/A;   Family History  Problem Relation Age of Onset   Depression Mother  Colon polyps Mother    Social History   Socioeconomic History   Marital status: Single    Spouse name: Not on file   Number of children: 1   Years of education: Not on file   Highest education level: Not on file  Occupational History   Not on file  Tobacco Use   Smoking status: Every Day    Types: E-cigarettes    Passive exposure: Current   Smokeless tobacco: Former  Building services engineer status: Every Day   Substance and Sexual Activity   Alcohol use: Yes    Comment: every 3 days   Drug use: No   Sexual activity: Not Currently  Other Topics Concern   Not on file  Social History Narrative   Not on file   Social Drivers of Health   Financial Resource Strain: Not on file  Food Insecurity: Not on file  Transportation Needs: No Transportation Needs (03/09/2023)   PRAPARE - Administrator, Civil Service (Medical): No    Lack of Transportation (Non-Medical): No  Physical Activity: Not on file  Stress: Not on file  Social Connections: Unknown (11/29/2021)   Received from [facility], [facility]   Social Network    Social Network: Not on file  Intimate Partner Violence: Unknown (10/26/2021)   Received from [facility], [facility]   HITS    Physically Hurt: Not on file    Insult or Talk Down To: Not on file    Threaten Physical Harm: Not on file    Scream or Curse: Not on file    Review of Systems Constitutional: Patient denies any unintentional weight loss or change in strength lntegumentary: Patient denies any rashes or pruritus Cardiovascular: Patient denies chest pain or syncope Respiratory: Patient denies shortness of breath Gastrointestinal: Patient denies nausea, vomiting, constipation, or diarrhea Musculoskeletal: Patient denies muscle cramps or weakness Neurologic: Patient denies convulsions or seizures Allergic/Immunologic: Patient denies recent allergic reaction(s) Hematologic/Lymphatic: Patient denies bleeding tendencies Endocrine: Patient denies heat/cold intolerance  GU: As per HPI.  OBJECTIVE Vitals:   08/21/23 1447  BP: 126/83  Pulse: (!) 123  Temp: 98.6 F (37 C)   There is no height or weight on file to calculate BMI.  Physical Examination Constitutional: No obvious distress; patient is non-toxic appearing  Cardiovascular: No visible lower extremity edema.  Respiratory: The patient does not have audible wheezing/stridor; respirations  do not appear labored  Gastrointestinal: Abdomen non-distended Musculoskeletal: Normal ROM of UEs  Skin: No obvious rashes/open sores  Neurologic: CN 2-12 grossly intact Psychiatric: Answered questions appropriately with normal affect  Hematologic/Lymphatic/Immunologic: No obvious bruises or sites of spontaneous bleeding  GU: Penoscrotal incision well approximated and intact, dry, and without erythema, warmth, or drainage.   Chaperone offered for pelvic exam; patient declined.   ASSESSMENT Penile cyst  Postop check  Postop wound fully healed; no acute findings. We agreed to follow up on an as-needed basis. Pt verbalized understanding and agreement. All questions were answered.   PLAN Advised the following: 1. Return if symptoms worsen or fail to improve.  No orders of the defined types were placed in this encounter.   It has been explained that the patient is to follow regularly with their PCP in addition to all other providers involved in their care and to follow instructions provided by these respective offices. Patient advised to contact urology clinic if any urologic-pertaining questions, concerns, new symptoms or problems arise in the interim period.  There are  no Patient Instructions on file for this visit.  Electronically signed by:  Donnita Falls, FNP   08/21/23    3:10 PM

## 2023-08-20 ENCOUNTER — Encounter: Payer: Self-pay | Admitting: Orthopedic Surgery

## 2023-08-20 ENCOUNTER — Ambulatory Visit (INDEPENDENT_AMBULATORY_CARE_PROVIDER_SITE_OTHER): Payer: MEDICAID | Admitting: Orthopedic Surgery

## 2023-08-20 DIAGNOSIS — M778 Other enthesopathies, not elsewhere classified: Secondary | ICD-10-CM

## 2023-08-20 NOTE — Progress Notes (Signed)
Clinton Fisher - 33 y.o. male MRN [id number]  Date of birth: 06/04/1991  Office Visit Note: Visit Date: 08/20/2023 PCP: Clinton Bible, FNP Referred by: Clinton Fisher, *  Subjective: Chief Complaint  Patient presents with   Right Wrist - Pain, Follow-up   HPI: Clinton Fisher is a pleasant 33 y.o. male who presents today for follow-up of ongoing right ulnar-sided wrist pain.  He was instructed to get a wrist widget for the right wrist which she has been utilizing.  He states that this is helping his pain.Marland Kitchen  Pertinent ROS were reviewed with the patient and found to be negative unless otherwise specified above in HPI.     Assessment & Plan: Visit Diagnoses: No diagnosis found.  Plan: His ECU tendinitis is improving with utilization of the wrist widget which is good to see.  He can continue utilizing this as needed may forward.  Follow-up as needed.  He is pleased with his progress.  Follow-up: No follow-ups on file.   Meds & Orders: No orders of the defined types were placed in this encounter.  No orders of the defined types were placed in this encounter.    Procedures: No procedures performed      Clinical History: RIGHT WRIST - COMPLETE 3+ VIEW   COMPARISON:  None Available.   FINDINGS: There is no evidence of fracture or dislocation. There is no evidence of arthropathy or other focal bone abnormality. Soft tissues are unremarkable.   IMPRESSION: Negative.     Electronically Signed   By: Clinton Fisher M.D.   On: 01/24/2023 11:17  He reports that he has been smoking e-cigarettes. He has been exposed to tobacco smoke. He has quit using smokeless tobacco.  Recent Labs    02/21/23 0822  HGBA1C 5.5    Objective:   Vital Signs: There were no vitals taken for this visit.  Physical Exam  Gen: Well-appearing, in no acute distress; non-toxic CV: Regular Rate. Well-perfused. Warm.  Resp: Breathing unlabored on room air; no wheezing. Psych:  Fluid speech in conversation; appropriate affect; normal thought process  Ortho Exam PHYSICAL EXAM:  General: Patient is well appearing and in no distress. Cervical spine mobility is full in all directions:  Skin and Muscle: No skin changes are apparent to upper extremities.  Muscle bulk and contour normal, no signs of atrophy.     Range of Motion and Palpation Tests: Mobility is full about the elbows with flexion and extension.  Forearm supination and pronation are 85/85 bilaterally.  Wrist flexion/extension is 75/65 bilaterally.  Digital flexion and extension are full.  Thumb opposition is full to the base of the small fingers bilaterally.    No cords or nodules are palpated.  No triggering is observed.    No tenderness over the thumb CMC articulations is observed.  Scaphoid shift test is negative bilaterally.  Finklestein test is negative bilaterally.  Ulnar impingement test is negative bilaterally.  No evidence of radiocarpal, midcarpal or intercarpal joint instability with provocation.    Synergy test negative right side, no evidence of ECU instability  Neurologic, Vascular, Motor: Sensation is intact to light touch in the median/radial, slightly diminished in the right ulnar distributions.  Tinel's testing positive at right cubital tunnel, negative left cubital and carpal tunnel.   Phalen's negative, Derkan's compression negative bilaterally  Fingers pink and well perfused.  Capillary refill is brisk.      Lab Results  Component Value Date   HGBA1C 5.5  02/21/2023     Imaging: No results found.  Past Medical/Family/Surgical/Social History: Medications & Allergies reviewed per EMR, new medications updated. 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   Manic affective disorder with recurrent episode (HCC)    Seasonal allergies    sore throat 07/29/2013   Skin problem 12/07/2021   Family History  Problem Relation Age of Onset   Depression Mother    Colon polyps Mother    Past Surgical History:  Procedure Laterality Date   CYST EXCISION PERINEAL N/A 07/12/2023   Procedure: SCROTUM EXPLORATION-excision of penile sebaceous cyst;  Surgeon: Clinton Gauze, MD;  Location: [facility];  Service: Urology;  Laterality: N/A;   FOOT FOREIGN BODY REMOVAL Right 01/19/2011   KNEE ARTHROSCOPY Right 08/01/2013   Procedure: ARTHROSCOPY RIGHT KNEE, Chondroplasty, Excision of Plica;  Surgeon: Harvie Junior, MD;  Location: [facility];  Service: Orthopedics;  Laterality: Right;   SCROTAL EXPLORATION N/A 11/09/2022   Procedure: SCROTUM EXPLORATION-excision of scrotal sebaceous cyst;  Surgeon: Clinton Gauze, MD;  Location: [facility];  Service: Urology;  Laterality: N/A;   Social History   Occupational History   Not on file  Tobacco Use   Smoking status: Every Day    Types: E-cigarettes    Passive exposure: Current   Smokeless tobacco: Former  Building services engineer status: Every Day  Substance and Sexual Activity   Alcohol use: Yes    Comment: every 3 days   Drug use: No   Sexual activity: Not Currently    Clinton Fisher) Clinton Fisher, M.D. [facility] 3:10 PM

## 2023-08-21 ENCOUNTER — Ambulatory Visit (INDEPENDENT_AMBULATORY_CARE_PROVIDER_SITE_OTHER): Payer: MEDICAID | Admitting: Urology

## 2023-08-21 ENCOUNTER — Encounter: Payer: Self-pay | Admitting: Urology

## 2023-08-21 VITALS — BP 126/83 | HR 123 | Temp 98.6°F

## 2023-08-21 DIAGNOSIS — Z87438 Personal history of other diseases of male genital organs: Secondary | ICD-10-CM | POA: Diagnosis not present

## 2023-08-21 DIAGNOSIS — Z09 Encounter for follow-up examination after completed treatment for conditions other than malignant neoplasm: Secondary | ICD-10-CM | POA: Diagnosis not present

## 2023-08-21 DIAGNOSIS — N4889 Other specified disorders of penis: Secondary | ICD-10-CM

## 2023-08-31 ENCOUNTER — Ambulatory Visit (INDEPENDENT_AMBULATORY_CARE_PROVIDER_SITE_OTHER): Payer: MEDICAID | Admitting: Family Medicine

## 2023-08-31 ENCOUNTER — Encounter (HOSPITAL_BASED_OUTPATIENT_CLINIC_OR_DEPARTMENT_OTHER): Payer: Self-pay | Admitting: Family Medicine

## 2023-08-31 VITALS — BP 122/88 | HR 71 | Ht 69.0 in | Wt 215.7 lb

## 2023-08-31 DIAGNOSIS — S39012A Strain of muscle, fascia and tendon of lower back, initial encounter: Secondary | ICD-10-CM | POA: Diagnosis not present

## 2023-08-31 DIAGNOSIS — R030 Elevated blood-pressure reading, without diagnosis of hypertension: Secondary | ICD-10-CM | POA: Diagnosis not present

## 2023-08-31 MED ORDER — CYCLOBENZAPRINE HCL 10 MG PO TABS: 10.0000 mg | ORAL_TABLET | Freq: Three times a day (TID) | ORAL | 0 refills | Status: DC | PRN

## 2023-08-31 NOTE — Progress Notes (Signed)
 Subjective:   Clinton Fisher May 22, 1991 08/31/2023  Chief Complaint  Patient presents with   Medical Management of Chronic Issues    Patient is here today for a follow up on BP. States he feels like he pulled a muscle in his back.    HPI: Clinton Fisher presents today for re-assessment and management of chronic medical conditions.  Elevated Blood pressure Readings:  Patient has a hx of elevated BP readings without dx of HTN.  Patient is not regularly keeping a check on BP at home.  Adhering to low sodium diet: Yes and has decreased caffeine intake significantly  Exercising Regularly: Yes Denies headache, dizziness, CP, SHOB, vision changes.    BP Readings from Last 3 Encounters:  08/31/23 122/88  08/21/23 126/83  08/03/23 (!) 144/92    BACK PAIN:   Clinton Fisher presents with complaint of back pain.   Onset: 3 days Location: Mid to low back Recent Injury: None Character: Sharp pain that has impaired sleep for the past 3 days  Radiation of Pain: None  Aggravating Factors: Movement Alleviating Factors: Ibuprofen  800mg  with food  ASSOCIATED SYMPTOMS:  Chills: no Dysuria: no Abdominal Pain: no Nausea: no Vomiting: no Hematuria: no Dizziness: no Bowel or Bladder Dysfunction: no Numbness: no Weakness: no   The following portions of the patient's history were reviewed and updated as appropriate: past medical history, past surgical history, family history, social history, allergies, medications, and problem list.   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   Manic affective disorder with recurrent episode (HCC)    Seasonal allergies    sore throat 07/29/2013   Skin problem 12/07/2021   Past Surgical History:  Procedure Laterality Date   CYST EXCISION PERINEAL N/A 07/12/2023   Procedure: SCROTUM EXPLORATION-excision of penile sebaceous cyst;  Surgeon: Sherrilee Belvie CROME, MD;  Location: [facility];  Service: Urology;  Laterality: N/A;   FOOT FOREIGN BODY REMOVAL Right 01/19/2011   KNEE ARTHROSCOPY Right 08/01/2013   Procedure: ARTHROSCOPY RIGHT KNEE, Chondroplasty, Excision of Plica;  Surgeon: Norleen CROME Gavel, MD;  [facility];  Service: Orthopedics;  Laterality: Right;   SCROTAL EXPLORATION N/A 11/09/2022   Procedure: SCROTUM EXPLORATION-excision of scrotal sebaceous cyst;  Surgeon: Sherrilee Belvie CROME, MD;  Location: [facility];  Service: Urology;  Laterality: N/A;   Family History  Problem Relation Age of Onset   Depression Mother    Colon polyps Mother    Outpatient Medications Prior to Visit  Medication Sig Dispense Refill   bacitracin  ointment Apply to affected area daily 30 g 0   clobetasol (TEMOVATE) 0.05 % external solution Apply 1 Application topically as needed (apply as needed to scalp).     fluticasone  (CUTIVATE ) 0.05 % cream Apply  1 Application topically as needed (apply as needed to face).     ibuprofen  (ADVIL ) 800 MG tablet Take 1 tablet (800 mg total) by mouth every 6 (six) hours as needed. 60 tablet 1   acetaminophen  (TYLENOL ) 500 MG tablet Take 500 mg by mouth every 6 (six) hours as needed for mild pain or moderate pain. (Patient not taking: Reported on 08/31/2023)     erythromycin  ophthalmic ointment Place a 1/2 inch ribbon of ointment into the  right lower eyelid BID prn. (Patient not taking: Reported on 08/21/2023) 3.5 g 0   oxyCODONE -acetaminophen  (PERCOCET) 5-325 MG tablet Take 1 tablet by mouth every 4 (four) hours as needed for severe pain (pain score 7-10). (Patient not taking: Reported on 08/21/2023) 30 tablet 0   sulfamethoxazole -trimethoprim  (BACTRIM  DS) 800-160 MG tablet Take 1 tablet by mouth 2 (two) times daily. 14 tablet 0   No facility-administered medications prior to visit.   Allergies  Allergen Reactions   Trazodone And Nefazodone Other (See Comments)    priapism   Amoxicillin Diarrhea   Amoxicillin-Pot Clavulanate Diarrhea    Diarrhea    Clavulanic Acid Other (See Comments)    Unknown     ROS: A complete ROS was performed with pertinent positives/negatives noted in the HPI. The remainder of the ROS are negative.    Objective:   Today's Vitals   08/31/23 0805  BP: 122/88  Pulse: 71  SpO2: 98%  Weight: 215 lb 11.2 oz (97.8 kg)  Height: 5' 9 (1.753 m)    Physical Exam          GENERAL: Well-appearing, in NAD. Well nourished.  SKIN: Pink, warm and dry.  Head: Normocephalic. NECK: Trachea midline. Full ROM w/o pain or tenderness.  RESPIRATORY: Chest wall symmetrical. Respirations even and non-labored.  MSK: Muscle tone and strength appropriate for age. Joints w/o tenderness, redness, or swelling. No step off, tenderness or deformity to spine. Full ROM present. Mild muscle banding present to left lumbar spine.  EXTREMITIES: Without clubbing, cyanosis, or edema.  NEUROLOGIC: No motor or sensory deficits. Steady, even gait. C2-C12 intact.  PSYCH/MENTAL STATUS: Alert, oriented x 3. Cooperative, appropriate mood and affect.      Assessment & Plan:  1. Strain of lumbar region, initial encounter (Primary) Recommend heating pad, stretching, rest, and flexeril  as needed for muscle spasms. Likely strain with exercise and lifting. Follow up if no improvement in 2 weeks.  - cyclobenzaprine  (FLEXERIL ) 10 MG  tablet; Take 1 tablet (10 mg total) by mouth 3 (three) times daily as needed for muscle spasms.  Dispense: 30 tablet; Refill: 0  2. Elevated BP without diagnosis of hypertension Resolved. BP WNL.    Meds ordered this encounter  Medications   cyclobenzaprine  (FLEXERIL ) 10 MG tablet    Sig: Take 1 tablet (10 mg total) by mouth 3 (three) times daily as needed for muscle spasms.    Dispense:  30 tablet    Refill:  0    Supervising Provider:   DE CUBA, RAYMOND J [[id number]]   Lab Orders  No laboratory test(s) ordered today    Return in about 6 months (around 03/10/2024) for ANNUAL PHYSICAL (fasting labs at visit) .    Patient to reach out to office if new, worrisome, or unresolved symptoms arise or if no improvement in patient's condition. Patient verbalized understanding and is agreeable to treatment plan. All questions answered to patient's satisfaction.    Clinton Fisher, [place]

## 2023-11-07 ENCOUNTER — Encounter (HOSPITAL_BASED_OUTPATIENT_CLINIC_OR_DEPARTMENT_OTHER): Payer: Self-pay

## 2023-11-13 ENCOUNTER — Ambulatory Visit (HOSPITAL_BASED_OUTPATIENT_CLINIC_OR_DEPARTMENT_OTHER): Payer: MEDICAID | Admitting: Family Medicine

## 2023-12-31 ENCOUNTER — Ambulatory Visit (HOSPITAL_BASED_OUTPATIENT_CLINIC_OR_DEPARTMENT_OTHER): Payer: MEDICAID | Admitting: Family Medicine

## 2023-12-31 ENCOUNTER — Encounter (HOSPITAL_BASED_OUTPATIENT_CLINIC_OR_DEPARTMENT_OTHER): Payer: Self-pay | Admitting: Family Medicine

## 2023-12-31 VITALS — BP 123/96 | HR 93 | Ht 69.0 in | Wt 205.6 lb

## 2023-12-31 DIAGNOSIS — S46012A Strain of muscle(s) and tendon(s) of the rotator cuff of left shoulder, initial encounter: Secondary | ICD-10-CM | POA: Diagnosis not present

## 2023-12-31 MED ORDER — MELOXICAM 15 MG PO TABS: 15.0000 mg | ORAL_TABLET | Freq: Every day | ORAL | 0 refills | Status: AC | PRN

## 2023-12-31 NOTE — Progress Notes (Unsigned)
 Acute Care Office Visit  Subjective:   Clinton Fisher [birth year] 12/31/2023  Chief Complaint  Patient presents with   Shoulder Pain    Patient has been having pain in left shoulder pain for about a month now. Does not believe he has done anything that might've caused the pain. Has tried tylenol  to see if it would help with the pain but states the pain is not going away.    HPI: Pt presents with left shoulder pain that began 15mo ago upon awakening one morning. Denies fall or trauma. Pain is described as sharp, waxing and waning, non-radiating. Certain movements worsen pain, nothing improves pain. Pt has tried Tylenol , ibuprofen , ice, and stretching exercises without improvement in sx. He does report increase use of his left hand/arm within the last month d/t right wrist surgery. Denies previous injury or surgery involving this shoulder. Denies numbness or tingling, neck pain.    The following portions of the patient's history were reviewed and updated as appropriate: past medical history, past surgical history, family history, social history, allergies, medications, and problem list.   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   Manic affective disorder with recurrent episode (HCC)    Seasonal allergies    sore  throat 07/29/2013   Skin problem 12/07/2021   Past Surgical History:  Procedure Laterality Date   CYST EXCISION PERINEAL N/A 07/12/2023   Procedure: SCROTUM EXPLORATION-excision of penile sebaceous cyst;  Surgeon: Marco Severs, MD;  Location: [facility];  Service: Urology;  Laterality: N/A;   FOOT FOREIGN BODY REMOVAL Right 01/19/2011   KNEE ARTHROSCOPY Right 08/01/2013   Procedure: ARTHROSCOPY RIGHT KNEE, Chondroplasty, Excision of Plica;  Surgeon: Boston Byers, MD;  Location: [facility];  Service: Orthopedics;  Laterality: Right;   SCROTAL EXPLORATION N/A 11/09/2022   Procedure: SCROTUM EXPLORATION-excision of scrotal sebaceous cyst;  Surgeon: Marco Severs, MD;  Location: [facility];  Service: Urology;  Laterality: N/A;   Family History  Problem Relation Age of Onset   Depression Mother    Colon polyps Mother    Outpatient Medications Prior to Visit  Medication Sig Dispense Refill   acetaminophen  (TYLENOL ) 325 MG tablet Take 650 mg by mouth every 6 (six) hours as needed for mild pain (pain score 1-3) or moderate pain (pain score 4-6).     bacitracin  ointment Apply to affected area daily 30 g 0   clobetasol (TEMOVATE) 0.05 % external solution Apply 1 Application topically as needed (apply as needed to scalp).     cyclobenzaprine  (FLEXERIL ) 10 MG tablet Take 1 tablet (10 mg total) by mouth 3 (three) times daily as needed for muscle spasms. 30 tablet 0   fluticasone  (CUTIVATE ) 0.05 % cream Apply 1 Application topically as  needed (apply as needed to face).     ibuprofen  (ADVIL ) 800 MG tablet Take 1 tablet (800 mg total) by mouth every 6 (six) hours as needed. 60 tablet 1   No facility-administered medications prior to visit.   Allergies  Allergen Reactions   Trazodone And Nefazodone Other (See Comments)    priapism   Amoxicillin Diarrhea   Amoxicillin-Pot Clavulanate Diarrhea    Diarrhea    Clavulanic Acid Other (See Comments)    Unknown     ROS: A complete ROS  was performed with pertinent positives/negatives noted in the HPI. The remainder of the ROS are negative.    Objective:   Today's Vitals   12/31/23 1249 12/31/23 1332  BP: (!) 127/90 (!) 123/96  Pulse: 93   SpO2: 96%   Weight: 93.3 kg   Height: 5\' 9"  (1.753 m)     GENERAL: Well-appearing, in NAD. Well nourished.  SKIN: Pink, warm and dry. No rash, lesion, ulceration, or ecchymoses.  Head: Normocephalic. NECK: Trachea midline. Full ROM without pain or tenderness.    EYES: Conjunctiva clear without exudates. EOMI, PERRL, no drainage present.  THROAT: Uvula midline. Oropharynx clear. Mucous membranes pink and moist.  RESPIRATORY: Chest wall symmetrical. Respirations even and non-labored.  CARDIAC: Regular rate and rhythm. Peripheral pulses 2+ bilaterally.  MSK: Muscle tone and strength appropriate for age. FROM shoulder, elbow, wrist; pain with palpation of the anterior shoulder over rotator cuff, pain with abduction, extension, internal rotation, and external rotation. Positive empty can test. Mild tenderness with palpation over left trapezius muscle. Equal grip strength bilaterally. Bilateral 2+ radial pulses.  No redness or swelling.  EXTREMITIES: Without clubbing, cyanosis, or edema.  NEUROLOGIC: No motor or sensory deficits. Steady, even gait. C2-C12 intact.  PSYCH/MENTAL STATUS: Alert, oriented x 3. Cooperative, appropriate mood and affect.   No results found for any visits on 12/31/23.    Assessment & Plan:  1. Strain of musc/tend the rotator cuff of left shoulder, init (Primary) Discussed supportive therapy, including alternating ice & heat at a time, alternating Tylenol  & Mobic ; avoid ibuprofen  while taking Mobic . Stretching/PT exercises provided. Pt advised to reach out to PCP if sx remain unchanged, or worsen.    Meds ordered this encounter  Medications   meloxicam  (MOBIC ) 15 MG tablet    Sig: Take 1 tablet (15 mg total) by mouth daily as needed for up to 14 days  for pain.    Dispense:  30 tablet    Refill:  0    Supervising Provider:   DE Peru, RAYMOND J [[id number]]   Lab Orders  No laboratory test(s) ordered today   No images are attached to the encounter or orders placed in the encounter.  Return if symptoms worsen or fail to improve.    Patient to reach out to office if new, worrisome, or unresolved symptoms arise or if no improvement in patient's condition. Patient verbalized understanding and is agreeable to treatment plan. All questions answered to patient's satisfaction.   Treatment plan and recommendation(s) reviewed by supervising preceptor, Annell Barrow, FNP-C, prior to clinic discharge.   Therisa Flatten, BSN, RN DNP Student

## 2024-01-01 NOTE — Progress Notes (Signed)
 Acute Care Office Visit  Subjective:   Clinton Fisher [birth year] 12/31/2023  Chief Complaint  Patient presents with   Shoulder Pain    Patient has been having pain in left shoulder pain for about a month now. Does not believe he has done anything that might've caused the pain. Has tried tylenol  to see if it would help with the pain but states the pain is not going away.    HPI: Pt presents with left shoulder pain that began 1mo ago upon awakening one morning. Denies fall or trauma. Pain is described as sharp, waxing and waning, non-radiating. Certain movements worsen pain, nothing improves pain. Pt has tried Tylenol , ibuprofen , ice, and stretching exercises without improvement in sx. He does report increase use of his left hand/arm within the last month d/t right wrist surgery. Denies previous injury or surgery involving this shoulder. Denies numbness or tingling, neck pain.    The following portions of the patient's history were reviewed and updated as appropriate: past medical history, past surgical history, family history, social history, allergies, medications, and problem list.   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   Manic affective disorder with recurrent episode (HCC)    Seasonal allergies    sore  throat 07/29/2013   Skin problem 12/07/2021   Past Surgical History:  Procedure Laterality Date   CYST EXCISION PERINEAL N/A 07/12/2023   Procedure: SCROTUM EXPLORATION-excision of penile sebaceous cyst;  Surgeon: Marco Severs, MD;  Location: [facility];  Service: Urology;  Laterality: N/A;   FOOT FOREIGN BODY REMOVAL Right 01/19/2011   KNEE ARTHROSCOPY Right 08/01/2013   Procedure: ARTHROSCOPY RIGHT KNEE, Chondroplasty, Excision of Plica;  Surgeon: Boston Byers, MD;  Location: [facility];  Service: Orthopedics;  Laterality: Right;   SCROTAL EXPLORATION N/A 11/09/2022   Procedure: SCROTUM EXPLORATION-excision of scrotal sebaceous cyst;  Surgeon: Marco Severs, MD;  Location: [facility];  Service: Urology;  Laterality: N/A;   Family History  Problem Relation Age of Onset   Depression Mother    Colon polyps Mother    Outpatient Medications Prior to Visit  Medication Sig Dispense Refill   acetaminophen  (TYLENOL ) 325 MG tablet Take 650 mg by mouth every 6 (six) hours as needed for mild pain (pain score 1-3) or moderate pain (pain score 4-6).     bacitracin  ointment Apply to affected area daily 30 g 0   clobetasol (TEMOVATE) 0.05 % external solution Apply 1 Application topically as needed (apply as needed to scalp).     cyclobenzaprine  (FLEXERIL ) 10 MG tablet Take 1 tablet (10 mg total) by mouth 3 (three) times daily as needed for muscle spasms. 30 tablet 0   fluticasone  (CUTIVATE ) 0.05 % cream Apply 1 Application topically as  needed (apply as needed to face).     ibuprofen  (ADVIL ) 800 MG tablet Take 1 tablet (800 mg total) by mouth every 6 (six) hours as needed. 60 tablet 1   No facility-administered medications prior to visit.   Allergies  Allergen Reactions   Trazodone And Nefazodone Other (See Comments)    priapism   Amoxicillin Diarrhea   Amoxicillin-Pot Clavulanate Diarrhea    Diarrhea    Clavulanic Acid Other (See Comments)    Unknown     ROS: A complete ROS  was performed with pertinent positives/negatives noted in the HPI. The remainder of the ROS are negative.    Objective:   Today's Vitals   12/31/23 1249 12/31/23 1332  BP: (!) 127/90 (!) 123/96  Pulse: 93   SpO2: 96%   Weight: 205 lb 9.6 oz (93.3 kg)   Height: 5\' 9"  (1.753 m)     GENERAL: Well-appearing, in NAD. Well nourished.  SKIN: Pink, warm and dry. No rash, lesion, ulceration, or ecchymoses.  Head: Normocephalic. NECK: Trachea midline. Full ROM without pain or tenderness.    EYES: Conjunctiva clear without exudates. EOMI, PERRL, no drainage present.  THROAT: Uvula midline. Oropharynx clear. Mucous membranes pink and moist.  RESPIRATORY: Chest wall symmetrical. Respirations even and non-labored.  CARDIAC: Regular rate and rhythm. Peripheral pulses 2+ bilaterally.  MSK: Muscle tone and strength appropriate for age. FROM shoulder, elbow, wrist; pain with palpation of the anterior shoulder over rotator cuff, pain with abduction, extension, internal rotation, and external rotation. Positive empty can test. Mild tenderness with palpation over left trapezius muscle. Equal grip strength bilaterally. Bilateral 2+ radial pulses.  No redness or swelling.  EXTREMITIES: Without clubbing, cyanosis, or edema.  NEUROLOGIC: No motor or sensory deficits. Steady, even gait. C2-C12 intact.  PSYCH/MENTAL STATUS: Alert, oriented x 3. Cooperative, appropriate mood and affect.   No results found for any visits on 12/31/23.    Assessment & Plan:  1. Strain of musc/tend the rotator cuff of left shoulder, init (Primary) Discussed supportive therapy, including alternating ice & heat at a time, alternating Tylenol  & Mobic ; avoid ibuprofen  while taking Mobic . Stretching/PT exercises provided. Pt advised to reach out to PCP if sx remain unchanged, or worsen.    Meds ordered this encounter  Medications   meloxicam  (MOBIC ) 15 MG tablet    Sig: Take 1 tablet (15 mg total) by mouth daily as needed for  up to 14 days for pain.    Dispense:  30 tablet    Refill:  0    Supervising Provider:   DE Peru, RAYMOND J [[id number]]   Lab Orders  No laboratory test(s) ordered today   No images are attached to the encounter or orders placed in the encounter.  Return if symptoms worsen or fail to improve.    Patient to reach out to office if new, worrisome, or unresolved symptoms arise or if no improvement in patient's condition. Patient verbalized understanding and is agreeable to treatment plan. All questions answered to patient's satisfaction.    Sylvester Evert, FNP

## 2024-01-08 ENCOUNTER — Ambulatory Visit (INDEPENDENT_AMBULATORY_CARE_PROVIDER_SITE_OTHER): Payer: MEDICAID | Admitting: Orthopedic Surgery

## 2024-01-08 ENCOUNTER — Other Ambulatory Visit: Payer: Self-pay

## 2024-01-08 DIAGNOSIS — M778 Other enthesopathies, not elsewhere classified: Secondary | ICD-10-CM

## 2024-01-08 DIAGNOSIS — M79641 Pain in right hand: Secondary | ICD-10-CM

## 2024-01-08 NOTE — Progress Notes (Signed)
 Clinton Fisher - 34 y.o. male MRN [id number]  Date of birth: 02/03/1991  Office Visit Note: Visit Date: 01/08/2024 PCP: Nonda Bays, FNP Referred by: Nonda Bays, *  Subjective: No chief complaint on file.  HPI: Clinton Fisher is a pleasant 33 y.o. male who presents today for follow-up of ongoing right ulnar-sided wrist pain.  Injury was approximately 1 year prior.  He has trialed ongoing immobilization, activity modification as well as a prior ultrasound-guided injection to the right wrist ECU region proximately 1 year prior as well.  At this juncture, his symptoms have remained refractory to conservative care.  Pertinent ROS were reviewed with the patient and found to be negative unless otherwise specified above in HPI.    Assessment & Plan: Visit Diagnoses:  1. Wrist tendonitis   2. Pain in right hand     Plan: Based on clinical examination today, there is concern for ongoing ECU tendinitis versus potential instability.  I would like to have this more closely examined with a dynamic ultrasound of the right wrist in order to look for any frank ECU subluxation or instability that would warrant potential subsheath reconstruction.  We also discussed that the ultrasound may indicate some potential injury to the ECU region or TFCC region that would warrant wrist arthroscopy and potential debridement versus repair.  Risks and benefits of the procedure were discussed, risks including but not limited to infection, bleeding, scarring, stiffness, nerve injury, tendon injury, vascular injury, ongoing instability, recurrence of symptoms and need for subsequent operation.  We also discussed the appropriate postoperative protocol and timeframe for return to activities and function.   I discussed this patient in detail with Dr. Vaughn Georges as well today who can perform the dynamic ultrasound of the right wrist in the near future.  Patient will return to me after this testing is  complete to review the results and discuss appropriate treatment strategy from a surgical standpoint.  He expressed full understanding today.  I spent 30 minutes in the care of this patient today including review of previous documentation, imaging obtained, face-to-face time discussing all options regarding treatment, discussion with colleague, and documenting the encounter.   Follow-up: No follow-ups on file.   Meds & Orders: No orders of the defined types were placed in this encounter.   Orders Placed This Encounter  Procedures   XR Hand Complete Right   XR Wrist Complete Right   Ambulatory referral to Orthopedic Surgery     Procedures: No procedures performed      Clinical History: RIGHT WRIST - COMPLETE 3+ VIEW   COMPARISON:  None Available.   FINDINGS: There is no evidence of fracture or dislocation. There is no evidence of arthropathy or other focal bone abnormality. Soft tissues are unremarkable.   IMPRESSION: Negative.     Electronically Signed   By: Allena Ito M.D.   On: 01/24/2023 11:17  He reports that he has been smoking e-cigarettes. He has been exposed to tobacco smoke. He has quit using smokeless tobacco.  Recent Labs    02/21/23 0822  HGBA1C 5.5    Objective:   Vital Signs: There were no vitals taken for this visit.  Physical Exam  Gen: Well-appearing, in no acute distress; non-toxic CV: Regular Rate. Well-perfused. Warm.  Resp: Breathing unlabored on room air; no wheezing. Psych: Fluid speech in conversation; appropriate affect; normal thought process  Ortho Exam PHYSICAL EXAM:  General: Patient is well appearing and in no distress. Cervical spine  mobility is full in all directions:  Skin and Muscle: No skin changes are apparent to upper extremities.  Muscle bulk and contour normal, no signs of atrophy.     Range of Motion and Palpation Tests: Mobility is full about the elbows with flexion and extension.  Forearm supination and  pronation are 85/85 left side, 75/75 right.  Wrist flexion/extension is 75/65 left wrist, right wrist limited today secondary to pain 45/35.  Digital flexion and extension are full.  Thumb opposition is full to the base of the small fingers bilaterally.    No cords or nodules are palpated.  No triggering is observed.    No tenderness over the thumb CMC articulations is observed.  Scaphoid shift test is negative bilaterally.  Finklestein test is negative bilaterally.  Ulnar impingement test is negative bilaterally.  Synergy test positive right side, given the ongoing swelling, difficult to elicit frank ECU instability with wrist flexion and supination in comparison to contralateral side  Neurologic, Vascular, Motor: Sensation is intact to light touch in the median/radial/ulnar distributions. Phalen's negative, Derkan's compression negative bilaterally  Fingers pink and well perfused.  Capillary refill is brisk.      Lab Results  Component Value Date   HGBA1C 5.5 02/21/2023     Imaging: No results found.  Past Medical/Family/Surgical/Social History: Medications & Allergies reviewed per EMR, new medications updated. 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   Manic affective disorder with recurrent episode (HCC)     Seasonal allergies    sore throat 07/29/2013   Skin problem 12/07/2021   Family History  Problem Relation Age of Onset   Depression Mother    Colon polyps Mother    Past Surgical History:  Procedure Laterality Date   CYST EXCISION PERINEAL N/A 07/12/2023   Procedure: SCROTUM EXPLORATION-excision of penile sebaceous cyst;  Surgeon: Marco Severs, MD;  Location: [facility];  Service: Urology;  Laterality: N/A;   FOOT FOREIGN BODY REMOVAL Right 01/19/2011   KNEE ARTHROSCOPY Right 08/01/2013   Procedure: ARTHROSCOPY RIGHT KNEE, Chondroplasty, Excision of Plica;  Surgeon: Boston Byers, MD;  Location: [facility];  Service: Orthopedics;  Laterality: Right;   SCROTAL EXPLORATION N/A 11/09/2022   Procedure: SCROTUM EXPLORATION-excision of scrotal sebaceous cyst;  Surgeon: Marco Severs, MD;  Location: [facility];  Service: Urology;  Laterality: N/A;   Social History   Occupational History   Not on file  Tobacco Use   Smoking status: Every Day    Types: E-cigarettes    Passive exposure: Current   Smokeless tobacco: Former  Building services engineer status: Every Day  Substance and Sexual Activity   Alcohol use: Yes    Comment: every 3 days   Drug use: No   Sexual activity:  Not Currently    Clinton Tigue Merlinda Starling) Marce Sensing, M.D. [facility]

## 2024-01-21 ENCOUNTER — Other Ambulatory Visit: Payer: Self-pay

## 2024-01-21 ENCOUNTER — Encounter: Payer: Self-pay | Admitting: Sports Medicine

## 2024-01-21 ENCOUNTER — Ambulatory Visit (INDEPENDENT_AMBULATORY_CARE_PROVIDER_SITE_OTHER): Payer: MEDICAID | Admitting: Sports Medicine

## 2024-01-21 DIAGNOSIS — M25531 Pain in right wrist: Secondary | ICD-10-CM | POA: Diagnosis not present

## 2024-01-21 DIAGNOSIS — G8929 Other chronic pain: Secondary | ICD-10-CM | POA: Diagnosis not present

## 2024-01-21 DIAGNOSIS — S63091A Other subluxation of right wrist and hand, initial encounter: Secondary | ICD-10-CM

## 2024-01-21 NOTE — Progress Notes (Signed)
 Clinton Fisher - 33 y.o. male MRN [id number]  Date of birth: Feb 27, 1991  Office Visit Note: Visit Date: 01/21/2024 PCP: Knute Thersia Bitters, FNP Referred by: Arlinda Buster, MD  Subjective: Chief Complaint  Patient presents with   Right Wrist - Pain    ECU pain, dynamic ultrasound needed today per Dr. Erwin. Injured one year ago, hx of injections.   HPI: Clinton Fisher is a pleasant 33 y.o. male who presents today for chronic right lateral wrist pain.  Clinton Fisher had an injury over a year ago when he was using an ax and felt a sharp pain over the lateral aspect of the wrist.  He has had several treatments including wrist brace immobilization, activity modification as well as a prior ultrasound-guided injection into the ECU tendon which she states only helped for a few hours/that day and then his pain returned.  He does get clicking about the wrist with certain motions which will bring about swelling.  He was sent by my partner, Dr. Erwin, for dynamic ultrasound evaluation and further evaluation/treatment.  Clinton Fisher is using meloxicam  15 mg for this and a separate shoulder condition.  Pertinent ROS were reviewed with the patient and found to be negative unless otherwise specified above in HPI.   Assessment & Plan: Visit Diagnoses:  1. Subluxation of extensor carpi ulnaris tendon, right, initial encounter   2. Chronic pain of right wrist    Plan: Impression is chronic and ongoing right ulnar-sided wrist pain which has been refractory to conservative treatment.  Dynamic ultrasound today does show instability about the ECU tendon with subluxation of about 80% over the ulnar styloid. There is some tendinopathy/tendinosis of the tendon but certainly no high-grade tearing.  Given his dynamic scan abnormality, his ongoing symptoms, and his evidence of subluxation on exam, I do think he would benefit from potential subsheath reconstruction with Dr. Erwin.  Clinton Fisher is agreeable and understanding to this.  I  will have him make an appointment with him to discuss further as well as any possible additional workup or treatment.  The TFCC region was not well-visualized on ultrasound today.  He will use meloxicam  15 mg daily for the next few days this week as well as applying ice for 15-20 minutes twice daily to help with the swelling and pain in the interim.  I am happy to see him back as needed.  Follow-up: Return for Make appt with Dr. Erwin for f/u of ECU instability.   Meds & Orders: No orders of the defined types were placed in this encounter.   Orders Placed This Encounter  Procedures   US  Extrem Up Right Ltd     Procedures: No procedures performed      Clinical History: RIGHT WRIST - COMPLETE 3+ VIEW   COMPARISON:  None Available.   FINDINGS: There is no evidence of fracture or dislocation. There is no evidence of arthropathy or other focal bone abnormality. Soft tissues are unremarkable.   IMPRESSION: Negative.     Electronically Signed   By: Inocente Ast M.D.   On: 01/24/2023 11:17  He reports that he has been smoking e-cigarettes. He has been exposed to tobacco smoke. He has quit using smokeless tobacco.  Recent Labs    02/21/23 0822  HGBA1C 5.5    Objective:    Physical Exam  Gen: Well-appearing, in no acute distress; non-toxic CV: Well-perfused. Warm.  Resp: Breathing unlabored on room air; no wheezing. Psych: Fluid speech in conversation; appropriate affect; normal thought  process  Ortho Exam - Right wrist: + TTP near the ulnar groove at the level of the styloid and just distal.  Very mild pain at fovea sign.  There is some soft tissue swelling over the lateral aspect of the wrist.  Positive ECU Synergy test, there is reproducible clicking with passive endrange supination/pronation, suggestive of ECU instability.  Imaging: US  Extrem Up Right Ltd Result Date: 01/21/2024 Limited view of the volar and lateral wrist ultrasound was performed by myself today.   Evaluation of the median nerve within the carpal tunnel inlet and outlet demonstrates no dynamic restriction.  There is no enlargement of the fascicles or the diameter of the median nerve.  The lateral wrist was identified without abnormality of the ulnar styloid.  The ECU tendon was seen sitting within the ulnar groove.  There is a mild degree of hypoechoic change within the short axis of the tendon indicative of tendinosis with possibly very mild interstitial tearing but no full-thickness tearing.  Dynamic ultrasound with wrist supination and pronation does show subluxation of the ECU tendon out of the groove and over the ulnar styloid which does sublux about 80%.   US  IMPRESSION: Dynamic evidence of ECU instability   *Static View, tendinosis with mild interstitial tearing (no full-thickness tear)  Dynamic US :        *Wrist Pronation (in groove)       *Wrist supination (subluxed)  *3 views of the right wrist and the right hand x-ray from 01/08/2024 were independently reviewed and interpreted by myself today.  X-rays demonstrate no significant arthritic change at the carpal row or the wrist joint.  Neutral to very slight negative ulnar variance.  No acute fracture noted.  Past Medical/Family/Surgical/Social History: Medications & Allergies reviewed per EMR, new medications updated. 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   Manic affective disorder with recurrent episode (HCC)    Seasonal allergies    sore throat 07/29/2013   Skin problem 12/07/2021   Family History  Problem Relation Age of Onset   Depression Mother    Colon polyps Mother    Past Surgical History:  Procedure Laterality Date   CYST EXCISION PERINEAL N/A 07/12/2023   Procedure: SCROTUM EXPLORATION-excision of penile sebaceous cyst;  Surgeon: Sherrilee Belvie CROME, MD;  Location: [facility];  Service: Urology;  Laterality: N/A;   FOOT FOREIGN BODY REMOVAL Right 01/19/2011   KNEE ARTHROSCOPY Right 08/01/2013   Procedure: ARTHROSCOPY RIGHT KNEE, Chondroplasty, Excision of Plica;  Surgeon: Norleen CROME Gavel, MD;  Location: [facility];  Service: Orthopedics;  Laterality: Right;   SCROTAL EXPLORATION N/A 11/09/2022   Procedure: SCROTUM EXPLORATION-excision of scrotal sebaceous cyst;  Surgeon: Sherrilee Belvie CROME, MD;  Location: [facility];  Service: Urology;  Laterality: N/A;   Social  History   Occupational History   Not on file  Tobacco Use   Smoking status: Every Day    Types: E-cigarettes    Passive exposure: Current   Smokeless tobacco: Former  Building services engineer status: Every Day  Substance and Sexual Activity   Alcohol use: Yes    Comment: every 3 days   Drug use: No   Sexual activity: Not Currently

## 2024-01-29 ENCOUNTER — Telehealth: Payer: Self-pay

## 2024-01-29 NOTE — Telephone Encounter (Signed)
 Tried calling pt to r/s appt to next Thursday morning 7/17

## 2024-01-30 NOTE — Telephone Encounter (Signed)
 Tried calling pt again to move up appt

## 2024-02-07 ENCOUNTER — Ambulatory Visit: Payer: MEDICAID | Admitting: Orthopedic Surgery

## 2024-02-07 DIAGNOSIS — S63091A Other subluxation of right wrist and hand, initial encounter: Secondary | ICD-10-CM

## 2024-02-07 NOTE — H&P (View-Only) (Signed)
 Clinton Fisher - 33 y.o. male MRN [id number]  Date of birth: Dec 01, 1990  Office Visit Note: Visit Date: 02/07/2024 PCP: Knute Thersia Bitters, FNP Referred by: Knute Thersia Bitters, *  Subjective: No chief complaint on file.  HPI: Clinton Fisher is a pleasant 33 y.o. male who returns today for follow-up of ongoing right ulnar-sided wrist pain.  He has failed extensive nonoperative care over the past 1 year with extensive bracing, activity modification and therapy.  He was seen by Dr. Burnetta recently for dynamic ultrasound which did diagnose ongoing ECU subluxation with notable instability.  Pertinent ROS were reviewed with the patient and found to be negative unless otherwise specified above in HPI.    Assessment & Plan: Visit Diagnoses:  1. Subluxation of extensor carpi ulnaris tendon, right, initial encounter      Plan: Based on workup, we have confirmed ongoing ECU instability.  I reviewed his dynamic ultrasound with Dr. Burnetta which does show evidence of ECU subluxation that would warrant subsheath reconstruction.  Risks and benefits of the procedure were discussed, risks including but not limited to infection, bleeding, scarring, stiffness, nerve injury, tendon injury, vascular injury, ongoing instability, recurrence of symptoms and need for subsequent operation.  We also discussed the appropriate postoperative protocol and timeframe for return to activities and function.   He expressed full understanding today.  We will move forward with surgical scheduling of right wrist ECU subsheath reconstruction and possible repair at the next available date per patient preference.   Follow-up: No follow-ups on file.   Meds & Orders: No orders of the defined types were placed in this encounter.   No orders of the defined types were placed in this encounter.    Procedures: No procedures performed      Clinical History: RIGHT WRIST - COMPLETE 3+ VIEW   COMPARISON:  None  Available.   FINDINGS: There is no evidence of fracture or dislocation. There is no evidence of arthropathy or other focal bone abnormality. Soft tissues are unremarkable.   IMPRESSION: Negative.     Electronically Signed   By: Inocente Ast M.D.   On: 01/24/2023 11:17  He reports that he has been smoking e-cigarettes. He has been exposed to tobacco smoke. He has quit using smokeless tobacco.  Recent Labs    02/21/23 0822  HGBA1C 5.5    Objective:   Vital Signs: There were no vitals taken for this visit.  Physical Exam  Gen: Well-appearing, in no acute distress; non-toxic CV: Regular Rate. Well-perfused. Warm.  Resp: Breathing unlabored on room air; no wheezing. Psych: Fluid speech in conversation; appropriate affect; normal thought process  Ortho Exam PHYSICAL EXAM:  General: Patient is well appearing and in no distress. Cervical spine mobility is full in all directions:  Skin and Muscle: No skin changes are apparent to upper extremities.  Muscle bulk and contour normal, no signs of atrophy.     Range of Motion and Palpation Tests: Mobility is full about the elbows with flexion and extension.  Forearm supination and pronation are 85/85 left side, 75/75 right.  Wrist flexion/extension is 75/65 left wrist, right wrist limited today secondary to pain 45/35.  Digital flexion and extension are full.  Thumb opposition is full to the base of the small fingers bilaterally.    No cords or nodules are palpated.  No triggering is observed.    No tenderness over the thumb CMC articulations is observed.  Scaphoid shift test is negative bilaterally.  Finklestein  test is negative bilaterally.  Ulnar impingement test is negative bilaterally.  Synergy test positive right side, given the ongoing swelling, ECU instability with wrist flexion and supination in comparison to contralateral side  Neurologic, Vascular, Motor: Sensation is intact to light touch in the median/radial/ulnar  distributions. Phalen's negative, Derkan's compression negative bilaterally  Fingers pink and well perfused.  Capillary refill is brisk.      Lab Results  Component Value Date   HGBA1C 5.5 02/21/2023     Imaging: No results found.  Past Medical/Family/Surgical/Social History: Medications & Allergies reviewed per EMR, new medications updated. 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   Manic affective disorder with recurrent episode (HCC)    Seasonal allergies    sore throat 07/29/2013   Skin problem 12/07/2021   Family History  Problem Relation Age of Onset   Depression Mother    Colon polyps Mother    Past Surgical History:  Procedure Laterality Date   CYST EXCISION PERINEAL N/A 07/12/2023   Procedure: SCROTUM EXPLORATION-excision of penile sebaceous cyst;  Surgeon: Sherrilee Belvie CROME, MD;  Location: [facility];  Service: Urology;  Laterality: N/A;   FOOT FOREIGN BODY REMOVAL Right 01/19/2011   KNEE ARTHROSCOPY Right 08/01/2013   Procedure: ARTHROSCOPY RIGHT KNEE, Chondroplasty, Excision of Plica;  Surgeon: Norleen CROME Gavel, MD;  Location: [facility];  Service: Orthopedics;  Laterality: Right;   SCROTAL EXPLORATION N/A 11/09/2022   Procedure: SCROTUM EXPLORATION-excision of scrotal sebaceous cyst;   Surgeon: Sherrilee Belvie CROME, MD;  Location: [facility];  Service: Urology;  Laterality: N/A;   Social History   Occupational History   Not on file  Tobacco Use   Smoking status: Every Day    Types: E-cigarettes    Passive exposure: Current   Smokeless tobacco: Former  Building services engineer status: Every Day  Substance and Sexual Activity   Alcohol use: Yes    Comment: every 3 days   Drug use: No   Sexual activity: Not Currently    Ralphael Southgate Estela) Arlinda, M.D. [facility]

## 2024-02-07 NOTE — Progress Notes (Unsigned)
 Clinton Fisher - 33 y.o. male MRN [id number]  Date of birth: Dec 01, 1990  Office Visit Note: Visit Date: 02/07/2024 PCP: Knute Thersia Bitters, FNP Referred by: Knute Thersia Bitters, *  Subjective: No chief complaint on file.  HPI: Clinton Fisher is a pleasant 33 y.o. male who returns today for follow-up of ongoing right ulnar-sided wrist pain.  He has failed extensive nonoperative care over the past 1 year with extensive bracing, activity modification and therapy.  He was seen by Dr. Burnetta recently for dynamic ultrasound which did diagnose ongoing ECU subluxation with notable instability.  Pertinent ROS were reviewed with the patient and found to be negative unless otherwise specified above in HPI.    Assessment & Plan: Visit Diagnoses:  1. Subluxation of extensor carpi ulnaris tendon, right, initial encounter      Plan: Based on workup, we have confirmed ongoing ECU instability.  I reviewed his dynamic ultrasound with Dr. Burnetta which does show evidence of ECU subluxation that would warrant subsheath reconstruction.  Risks and benefits of the procedure were discussed, risks including but not limited to infection, bleeding, scarring, stiffness, nerve injury, tendon injury, vascular injury, ongoing instability, recurrence of symptoms and need for subsequent operation.  We also discussed the appropriate postoperative protocol and timeframe for return to activities and function.   He expressed full understanding today.  We will move forward with surgical scheduling of right wrist ECU subsheath reconstruction and possible repair at the next available date per patient preference.   Follow-up: No follow-ups on file.   Meds & Orders: No orders of the defined types were placed in this encounter.   No orders of the defined types were placed in this encounter.    Procedures: No procedures performed      Clinical History: RIGHT WRIST - COMPLETE 3+ VIEW   COMPARISON:  None  Available.   FINDINGS: There is no evidence of fracture or dislocation. There is no evidence of arthropathy or other focal bone abnormality. Soft tissues are unremarkable.   IMPRESSION: Negative.     Electronically Signed   By: Inocente Ast M.D.   On: 01/24/2023 11:17  He reports that he has been smoking e-cigarettes. He has been exposed to tobacco smoke. He has quit using smokeless tobacco.  Recent Labs    02/21/23 0822  HGBA1C 5.5    Objective:   Vital Signs: There were no vitals taken for this visit.  Physical Exam  Gen: Well-appearing, in no acute distress; non-toxic CV: Regular Rate. Well-perfused. Warm.  Resp: Breathing unlabored on room air; no wheezing. Psych: Fluid speech in conversation; appropriate affect; normal thought process  Ortho Exam PHYSICAL EXAM:  General: Patient is well appearing and in no distress. Cervical spine mobility is full in all directions:  Skin and Muscle: No skin changes are apparent to upper extremities.  Muscle bulk and contour normal, no signs of atrophy.     Range of Motion and Palpation Tests: Mobility is full about the elbows with flexion and extension.  Forearm supination and pronation are 85/85 left side, 75/75 right.  Wrist flexion/extension is 75/65 left wrist, right wrist limited today secondary to pain 45/35.  Digital flexion and extension are full.  Thumb opposition is full to the base of the small fingers bilaterally.    No cords or nodules are palpated.  No triggering is observed.    No tenderness over the thumb CMC articulations is observed.  Scaphoid shift test is negative bilaterally.  Finklestein  test is negative bilaterally.  Ulnar impingement test is negative bilaterally.  Synergy test positive right side, given the ongoing swelling, ECU instability with wrist flexion and supination in comparison to contralateral side  Neurologic, Vascular, Motor: Sensation is intact to light touch in the median/radial/ulnar  distributions. Phalen's negative, Derkan's compression negative bilaterally  Fingers pink and well perfused.  Capillary refill is brisk.      Lab Results  Component Value Date   HGBA1C 5.5 02/21/2023     Imaging: No results found.  Past Medical/Family/Surgical/Social History: Medications & Allergies reviewed per EMR, new medications updated. 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   Manic affective disorder with recurrent episode (HCC)    Seasonal allergies    sore throat 07/29/2013   Skin problem 12/07/2021   Family History  Problem Relation Age of Onset   Depression Mother    Colon polyps Mother    Past Surgical History:  Procedure Laterality Date   CYST EXCISION PERINEAL N/A 07/12/2023   Procedure: SCROTUM EXPLORATION-excision of penile sebaceous cyst;  Surgeon: Sherrilee Belvie CROME, MD;  Location: [facility];  Service: Urology;  Laterality: N/A;   FOOT FOREIGN BODY REMOVAL Right 01/19/2011   KNEE ARTHROSCOPY Right 08/01/2013   Procedure: ARTHROSCOPY RIGHT KNEE, Chondroplasty, Excision of Plica;  Surgeon: Norleen CROME Gavel, MD;  Location: [facility];  Service: Orthopedics;  Laterality: Right;   SCROTAL EXPLORATION N/A 11/09/2022   Procedure: SCROTUM EXPLORATION-excision of scrotal sebaceous cyst;   Surgeon: Sherrilee Belvie CROME, MD;  Location: [facility];  Service: Urology;  Laterality: N/A;   Social History   Occupational History   Not on file  Tobacco Use   Smoking status: Every Day    Types: E-cigarettes    Passive exposure: Current   Smokeless tobacco: Former  Building services engineer status: Every Day  Substance and Sexual Activity   Alcohol use: Yes    Comment: every 3 days   Drug use: No   Sexual activity: Not Currently    Anshul Estela) Arlinda, M.D. [facility]

## 2024-02-08 ENCOUNTER — Other Ambulatory Visit: Payer: Self-pay

## 2024-02-08 ENCOUNTER — Encounter (HOSPITAL_BASED_OUTPATIENT_CLINIC_OR_DEPARTMENT_OTHER): Payer: Self-pay | Admitting: Orthopedic Surgery

## 2024-02-12 ENCOUNTER — Other Ambulatory Visit: Payer: Self-pay

## 2024-02-12 DIAGNOSIS — S63091A Other subluxation of right wrist and hand, initial encounter: Secondary | ICD-10-CM

## 2024-02-15 ENCOUNTER — Encounter: Payer: Self-pay | Admitting: Orthopedic Surgery

## 2024-02-15 ENCOUNTER — Encounter (HOSPITAL_BASED_OUTPATIENT_CLINIC_OR_DEPARTMENT_OTHER)
Admission: RE | Disposition: A | Discharge status: Home / Self Care | Payer: Self-pay | Source: Home / Self Care | Attending: Orthopedic Surgery

## 2024-02-15 ENCOUNTER — Telehealth: Payer: Self-pay

## 2024-02-15 ENCOUNTER — Ambulatory Visit (HOSPITAL_BASED_OUTPATIENT_CLINIC_OR_DEPARTMENT_OTHER)
Admission: RE | Admit: 2024-02-15 | Discharge: 2024-02-15 | Disposition: A | Discharge status: Home / Self Care | Payer: MEDICAID | Attending: Orthopedic Surgery | Admitting: Orthopedic Surgery

## 2024-02-15 ENCOUNTER — Encounter (HOSPITAL_BASED_OUTPATIENT_CLINIC_OR_DEPARTMENT_OTHER): Payer: Self-pay | Admitting: Orthopedic Surgery

## 2024-02-15 ENCOUNTER — Ambulatory Visit (HOSPITAL_BASED_OUTPATIENT_CLINIC_OR_DEPARTMENT_OTHER): Payer: MEDICAID | Admitting: Anesthesiology

## 2024-02-15 ENCOUNTER — Other Ambulatory Visit: Payer: Self-pay

## 2024-02-15 DIAGNOSIS — F319 Bipolar disorder, unspecified: Secondary | ICD-10-CM | POA: Diagnosis not present

## 2024-02-15 DIAGNOSIS — S63091A Other subluxation of right wrist and hand, initial encounter: Secondary | ICD-10-CM | POA: Diagnosis not present

## 2024-02-15 DIAGNOSIS — M25331 Other instability, right wrist: Secondary | ICD-10-CM | POA: Diagnosis present

## 2024-02-15 DIAGNOSIS — F17290 Nicotine dependence, other tobacco product, uncomplicated: Secondary | ICD-10-CM | POA: Diagnosis not present

## 2024-02-15 DIAGNOSIS — S63091D Other subluxation of right wrist and hand, subsequent encounter: Secondary | ICD-10-CM | POA: Diagnosis not present

## 2024-02-15 DIAGNOSIS — K219 Gastro-esophageal reflux disease without esophagitis: Secondary | ICD-10-CM | POA: Insufficient documentation

## 2024-02-15 HISTORY — PX: REPAIR EXTENSOR TENDON: SHX5382

## 2024-02-15 SURGERY — REPAIR, TENDON, EXTENSOR
Anesthesia: General | Site: Hand | Laterality: Right

## 2024-02-15 MED ORDER — ACETAMINOPHEN 500 MG PO TABS
ORAL_TABLET | ORAL | Status: AC
  Filled 2024-02-15: qty 2

## 2024-02-15 MED ORDER — OXYCODONE HCL 5 MG PO TABS: 5.0000 mg | ORAL_TABLET | Freq: Four times a day (QID) | ORAL | 0 refills | Status: DC | PRN

## 2024-02-15 MED ORDER — ACETAMINOPHEN 500 MG PO TABS
1000.0000 mg | ORAL_TABLET | Freq: Once | ORAL | Status: AC
  Administered 2024-02-15: 1000 mg via ORAL

## 2024-02-15 MED ORDER — MIDAZOLAM HCL 2 MG/2ML IJ SOLN: 0.5000 mg | Freq: Once | INTRAMUSCULAR | Status: DC | PRN

## 2024-02-15 MED ORDER — ONDANSETRON HCL 4 MG/2ML IJ SOLN
INTRAMUSCULAR | Status: DC | PRN
  Administered 2024-02-15: 4 mg via INTRAVENOUS

## 2024-02-15 MED ORDER — MIDAZOLAM HCL 2 MG/2ML IJ SOLN
INTRAMUSCULAR | Status: AC
  Filled 2024-02-15: qty 2

## 2024-02-15 MED ORDER — FENTANYL CITRATE (PF) 100 MCG/2ML IJ SOLN: 25.0000 ug | INTRAMUSCULAR | Status: DC | PRN

## 2024-02-15 MED ORDER — MIDAZOLAM HCL 2 MG/2ML IJ SOLN
2.0000 mg | Freq: Once | INTRAMUSCULAR | Status: AC
Start: 2024-02-15 — End: 2024-02-15
  Administered 2024-02-15: 2 mg via INTRAVENOUS

## 2024-02-15 MED ORDER — FENTANYL CITRATE (PF) 100 MCG/2ML IJ SOLN
INTRAMUSCULAR | Status: DC | PRN
  Administered 2024-02-15 (×2): 50 ug via INTRAVENOUS

## 2024-02-15 MED ORDER — FENTANYL CITRATE (PF) 100 MCG/2ML IJ SOLN
100.0000 ug | Freq: Once | INTRAMUSCULAR | Status: AC
  Administered 2024-02-15: 100 ug via INTRAVENOUS

## 2024-02-15 MED ORDER — CEFAZOLIN SODIUM-DEXTROSE 2-4 GM/100ML-% IV SOLN
INTRAVENOUS | Status: AC
Start: 2024-02-15 — End: 2024-02-15
  Filled 2024-02-15: qty 100

## 2024-02-15 MED ORDER — FENTANYL CITRATE (PF) 100 MCG/2ML IJ SOLN
INTRAMUSCULAR | Status: AC
  Filled 2024-02-15: qty 2

## 2024-02-15 MED ORDER — PROPOFOL 500 MG/50ML IV EMUL
INTRAVENOUS | Status: DC | PRN
  Administered 2024-02-15: 200 ug/kg/min via INTRAVENOUS

## 2024-02-15 MED ORDER — OXYCODONE HCL 5 MG/5ML PO SOLN: 5.0000 mg | Freq: Once | ORAL | Status: DC | PRN

## 2024-02-15 MED ORDER — PROPOFOL 10 MG/ML IV BOLUS
INTRAVENOUS | Status: DC | PRN
  Administered 2024-02-15: 150 mg via INTRAVENOUS

## 2024-02-15 MED ORDER — LACTATED RINGERS IV SOLN: INTRAVENOUS | Status: DC

## 2024-02-15 MED ORDER — OXYCODONE HCL 5 MG PO TABS: 5.0000 mg | ORAL_TABLET | Freq: Once | ORAL | Status: DC | PRN

## 2024-02-15 MED ORDER — DEXMEDETOMIDINE HCL IN NACL 400 MCG/100ML IV SOLN
INTRAVENOUS | Status: DC | PRN
  Administered 2024-02-15 (×2): 8 ug via INTRAVENOUS

## 2024-02-15 MED ORDER — MIDAZOLAM HCL 2 MG/2ML IJ SOLN
INTRAMUSCULAR | Status: DC | PRN
  Administered 2024-02-15: 1 mg via INTRAVENOUS

## 2024-02-15 MED ORDER — MEPERIDINE HCL 25 MG/ML IJ SOLN: 6.2500 mg | INTRAMUSCULAR | Status: DC | PRN

## 2024-02-15 MED ORDER — DEXAMETHASONE SODIUM PHOSPHATE 10 MG/ML IJ SOLN
INTRAMUSCULAR | Status: DC | PRN
  Administered 2024-02-15: 5 mg via INTRAVENOUS

## 2024-02-15 MED ORDER — BUPIVACAINE-EPINEPHRINE (PF) 0.5% -1:200000 IJ SOLN
INTRAMUSCULAR | Status: DC | PRN
Start: 2024-02-15 — End: 2024-02-15
  Administered 2024-02-15: 30 mL via PERINEURAL

## 2024-02-15 MED ORDER — LACTATED RINGERS IV SOLN: INTRAVENOUS | Status: DC | PRN

## 2024-02-15 MED ORDER — ONDANSETRON HCL 4 MG/2ML IJ SOLN
INTRAMUSCULAR | Status: AC
  Filled 2024-02-15: qty 2

## 2024-02-15 MED ORDER — PROPOFOL 10 MG/ML IV BOLUS
INTRAVENOUS | Status: AC
  Filled 2024-02-15: qty 20

## 2024-02-15 MED ORDER — CEFAZOLIN SODIUM-DEXTROSE 2-4 GM/100ML-% IV SOLN: 2.0000 g | INTRAVENOUS | Status: DC

## 2024-02-15 MED ORDER — CEFAZOLIN SODIUM-DEXTROSE 2-3 GM-%(50ML) IV SOLR
INTRAVENOUS | Status: DC | PRN
  Administered 2024-02-15: 2 g via INTRAVENOUS

## 2024-02-15 SURGICAL SUPPLY — 50 items
BENZOIN TINCTURE PRP APPL 2/3 (GAUZE/BANDAGES/DRESSINGS) IMPLANT
BLADE SURG 15 STRL LF DISP TIS (BLADE) ×2 IMPLANT
BNDG COHESIVE 4X5 TAN STRL LF (GAUZE/BANDAGES/DRESSINGS) ×1 IMPLANT
BNDG COMPR ESMARK 4X3 LF (GAUZE/BANDAGES/DRESSINGS) ×1 IMPLANT
BNDG ELASTIC 4INX 5YD STR LF (GAUZE/BANDAGES/DRESSINGS) ×2 IMPLANT
BNDG GAUZE DERMACEA FLUFF 4 (GAUZE/BANDAGES/DRESSINGS) ×1 IMPLANT
CHLORAPREP W/TINT 26 (MISCELLANEOUS) ×1 IMPLANT
CORD BIPOLAR FORCEPS 12FT (ELECTRODE) ×1 IMPLANT
COVER BACK TABLE 60X90IN (DRAPES) ×1 IMPLANT
CUFF TOURN SGL QUICK 18X4 (TOURNIQUET CUFF) ×1 IMPLANT
DERMABOND ADVANCED .7 DNX12 (GAUZE/BANDAGES/DRESSINGS) IMPLANT
DRAPE HAND 77X146 (DRAPES) ×1 IMPLANT
DRAPE SURG 17X23 STRL (DRAPES) ×1 IMPLANT
GAUZE PAD ABD 8X10 STRL (GAUZE/BANDAGES/DRESSINGS) IMPLANT
GAUZE SPONGE 4X4 12PLY STRL (GAUZE/BANDAGES/DRESSINGS) ×1 IMPLANT
GAUZE STRETCH 2X75IN STRL (MISCELLANEOUS) IMPLANT
GAUZE XEROFORM 1X8 LF (GAUZE/BANDAGES/DRESSINGS) IMPLANT
GLOVE BIO SURGEON STRL SZ7.5 (GLOVE) ×2 IMPLANT
GLOVE BIOGEL PI IND STRL 7.5 (GLOVE) ×1 IMPLANT
GOWN STRL REUS W/ TWL LRG LVL3 (GOWN DISPOSABLE) ×1 IMPLANT
GOWN STRL SURGICAL XL XLNG (GOWN DISPOSABLE) ×2 IMPLANT
MANIFOLD NEPTUNE II (INSTRUMENTS) ×1 IMPLANT
NDL HYPO 25X5/8 SAFETYGLIDE (NEEDLE) IMPLANT
NEEDLE HYPO 25X5/8 SAFETYGLIDE (NEEDLE) IMPLANT
NS IRRIG 1000ML POUR BTL (IV SOLUTION) ×1 IMPLANT
PACK BASIN DAY SURGERY FS (CUSTOM PROCEDURE TRAY) ×1 IMPLANT
PAD CAST 3X4 CTTN HI CHSV (CAST SUPPLIES) ×1 IMPLANT
PAD CAST 4YDX4 CTTN HI CHSV (CAST SUPPLIES) IMPLANT
SHEET MEDIUM DRAPE 40X70 STRL (DRAPES) ×1 IMPLANT
SLING ARM FOAM STRAP XLG (SOFTGOODS) IMPLANT
SPIKE FLUID TRANSFER (MISCELLANEOUS) IMPLANT
SPLINT PLASTER CAST FAST 5X30 (CAST SUPPLIES) IMPLANT
SPLINT PLASTER CAST XFAST 4X15 (CAST SUPPLIES) ×1 IMPLANT
SPONGE SURGIFOAM ABS GEL 12-7 (HEMOSTASIS) IMPLANT
STOCKINETTE IMPERVIOUS 9X36 MD (GAUZE/BANDAGES/DRESSINGS) ×1 IMPLANT
STRIP CLOSURE SKIN 1/2X4 (GAUZE/BANDAGES/DRESSINGS) IMPLANT
SUCTION TUBE FRAZIER 10FR DISP (SUCTIONS) IMPLANT
SUT CHROMIC 3 0 SH 27 (SUTURE) IMPLANT
SUT ETHIBOND 3-0 V-5 (SUTURE) IMPLANT
SUT ETHILON 4 0 PS 2 18 (SUTURE) IMPLANT
SUT MNCRL AB 3-0 PS2 27 (SUTURE) IMPLANT
SUT MNCRL AB 4-0 PS2 18 (SUTURE) IMPLANT
SUT PDS AB 4-0 RB1 27 (SUTURE) IMPLANT
SUT SILK 4 0 PS 2 (SUTURE) IMPLANT
SUT VIC AB 3-0 PS2 18XBRD (SUTURE) IMPLANT
SYR BULB EAR ULCER 3OZ GRN STR (SYRINGE) ×2 IMPLANT
SYR CONTROL 10ML LL (SYRINGE) IMPLANT
TOWEL GREEN STERILE FF (TOWEL DISPOSABLE) ×2 IMPLANT
TUBE CONNECTING 20X1/4 (TUBING) IMPLANT
UNDERPAD 30X36 HEAVY ABSORB (UNDERPADS AND DIAPERS) ×1 IMPLANT

## 2024-02-15 NOTE — Progress Notes (Signed)
Assisted Dr. Carswell Jackson with right, supraclavicular, ultrasound guided block. Side rails up, monitors on throughout procedure. See vital signs in flow sheet. Tolerated Procedure well. ?

## 2024-02-15 NOTE — Anesthesia Procedure Notes (Signed)
 Anesthesia Regional Block: Supraclavicular block   Pre-Anesthetic Checklist: , timeout performed,  Correct Patient, Correct Site, Correct Laterality,  Correct Procedure, Correct Position, site marked,  Risks and benefits discussed,  Surgical consent,  Pre-op evaluation,  At surgeon's request and post-op pain management  Laterality: Right and Upper  Prep: chloraprep       Needles:  Injection technique: Single-shot  Needle Type: Echogenic Needle     Needle Length: 9cm  Needle Gauge: 21     Additional Needles:   Procedures:,,,, ultrasound used (permanent image in chart),,    Narrative:  Start time: 02/15/2024 9:06 AM End time: 02/15/2024 9:12 AM Injection made incrementally with aspirations every 5 mL.  Performed by: Personally  Anesthesiologist: Leonce Athens, MD  Additional Notes: Pt identified in Holding room.  Monitors applied. Working IV access confirmed. Timeout, Sterile prep R clavicle and neck.  #21ga ECHOgenic Arrow block needle to supraclavicular brachial plexus with US  guidance.  30cc 0.5% Bupivacaine  1:200k epi injected incrementally after negative test dose.  Patient asymptomatic, VSS, no heme aspirated, tolerated well.   JAYSON Leonce, MD

## 2024-02-15 NOTE — Anesthesia Preprocedure Evaluation (Addendum)
 Anesthesia Evaluation  Patient identified by MRN, date of birth, ID band Patient awake    Reviewed: Allergy & Precautions, NPO status , Patient's Chart, lab work & pertinent test results  History of Anesthesia Complications Negative for: history of anesthetic complications  Airway Mallampati: II  TM Distance: >3 FB Neck ROM: Full    Dental  (+) Dental Advisory Given   Pulmonary Current Smoker and Patient abstained from smoking.   breath sounds clear to auscultation       Cardiovascular negative cardio ROS  Rhythm:Regular Rate:Normal     Neuro/Psych   Anxiety  Bipolar Disorder   negative neurological ROS     GI/Hepatic Neg liver ROS,GERD  Controlled,,  Endo/Other  BMI 31  Renal/GU negative Renal ROS     Musculoskeletal   Abdominal   Peds  Hematology negative hematology ROS (+)   Anesthesia Other Findings   Reproductive/Obstetrics                              Anesthesia Physical Anesthesia Plan  ASA: 2  Anesthesia Plan: MAC and Regional   Post-op Pain Management: Regional block* and Tylenol  PO (pre-op)*   Induction:   PONV Risk Score and Plan: 0 and Ondansetron   Airway Management Planned: Natural Airway and Simple Face Mask  Additional Equipment: None  Intra-op Plan:   Post-operative Plan:   Informed Consent: I have reviewed the patients History and Physical, chart, labs and discussed the procedure including the risks, benefits and alternatives for the proposed anesthesia with the patient or authorized representative who has indicated his/her understanding and acceptance.     Dental advisory given  Plan Discussed with: CRNA and Surgeon  Anesthesia Plan Comments: (Plan routine monitors, supraclavicular block )         Anesthesia Quick Evaluation

## 2024-02-15 NOTE — Discharge Instructions (Addendum)
 Hand Surgery Postop Instructions   Dressings: Maintain postoperative dressing until orthopedic follow-up.  Keep operative site clean and dry until orthopedic follow-up.  Wound Care: Keep your hand elevated above the level of your heart.  Do not allow it to dangle by your side. Moving your fingers is advised to stimulate circulation but will depend on the site of your surgery.  If you have a splint applied, your doctor will advise you regarding movement.  Activity: Do not drive or operate machinery until clearance given from physician. No heavy lifting with operative extremity.  Diet:  Drink liquids today or eat a light diet.  You may resume a regular diet tomorrow.    General expectations: Take prescribed medication if given, transition to over-the-counter medication as quickly as possible. Fingers may become slightly swollen.  Call your doctor if any of the following occur: Severe pain not relieved by pain medication. Elevated temperature. Dressing soaked with blood. Inability to move fingers. White or bluish color to fingers.   Per [facility] clinic policy, our goal is ensure optimal postoperative pain control with a multimodal pain management strategy. For all [facility] patients, our goal is to wean post-operative narcotic medications by 6 weeks post-operatively. If this is not possible due to utilization of pain medication prior to surgery, your [facility] doctor will support your acute post-operative pain control for the first 6 weeks postoperatively, with a plan to transition you back to your primary pain team following that. Clinton Fisher will work to ensure a Therapist, occupational.  Clinton Fisher, M.D. Hand Surgery [facility]    HAD TYLENOL  at 0840 hrs. NO tylenol  products till after 2:40 pm today    Regional Anesthesia Blocks  1. You may not be able to move or feel the blocked extremity after a regional anesthetic block. This may last may last from  3-48 hours after placement, but it will go away. The length of time depends on the medication injected and your individual response to the medication. As the nerves start to wake up, you may experience tingling as the movement and feeling returns to your extremity. If the numbness and inability to move your extremity has not gone away after 48 hours, please call your surgeon.   2. The extremity that is blocked will need to be protected until the numbness is gone and the strength has returned. Because you cannot feel it, you will need to take extra care to avoid injury. Because it may be weak, you may have difficulty moving it or using it. You may not know what position it is in without looking at it while the block is in effect.  3. For blocks in the legs and feet, returning to weight bearing and walking needs to be done carefully. You will need to wait until the numbness is entirely gone and the strength has returned. You should be able to move your leg and foot normally before you try and bear weight or walk. You will need someone to be with you when you first try to ensure you do not fall and possibly risk injury.  4. Bruising and tenderness at the needle site are common side effects and will resolve in a few days.  5. Persistent numbness or new problems with movement should be communicated to the surgeon or the [facility] [phone] [facility] [phone]). Post Anesthesia Home Care Instructions  Activity: Get plenty of rest for the remainder of the day. A responsible individual must  stay with you for 24 hours following the procedure.  For the next 24 hours, DO NOT: -Drive a car -Advertising copywriter -Drink alcoholic beverages -Take any medication unless instructed by your physician -Make any legal decisions or sign important papers.  Meals: Start with liquid foods such as gelatin or soup. Progress to regular foods as tolerated. Avoid greasy, spicy, heavy  foods. If nausea and/or vomiting occur, drink only clear liquids until the nausea and/or vomiting subsides. Call your physician if vomiting continues.  Special Instructions/Symptoms: Your throat may feel dry or sore from the anesthesia or the breathing tube placed in your throat during surgery. If this causes discomfort, gargle with warm salt water. The discomfort should disappear within 24 hours.  If you had a scopolamine patch placed behind your ear for the management of post- operative nausea and/or vomiting:  1. The medication in the patch is effective for 72 hours, after which it should be removed.  Wrap patch in a tissue and discard in the trash. Wash hands thoroughly with soap and water. 2. You may remove the patch earlier than 72 hours if you experience unpleasant side effects which may include dry mouth, dizziness or visual disturbances. 3. Avoid touching the patch. Wash your hands with soap and water after contact with the patch.    Post Anesthesia Home Care Instructions  Activity: Get plenty of rest for the remainder of the day. A responsible individual must stay with you for 24 hours following the procedure.  For the next 24 hours, DO NOT: -Drive a car -Advertising copywriter -Drink alcoholic beverages -Take any medication unless instructed by your physician -Make any legal decisions or sign important papers.  Meals: Start with liquid foods such as gelatin or soup. Progress to regular foods as tolerated. Avoid greasy, spicy, heavy foods. If nausea and/or vomiting occur, drink only clear liquids until the nausea and/or vomiting subsides. Call your physician if vomiting continues.  Special Instructions/Symptoms: Your throat may feel dry or sore from the anesthesia or the breathing tube placed in your throat during surgery. If this causes discomfort, gargle with warm salt water. The discomfort should disappear within 24 hours.  If you had a scopolamine patch placed behind your ear for  the management of post- operative nausea and/or vomiting:  1. The medication in the patch is effective for 72 hours, after which it should be removed.  Wrap patch in a tissue and discard in the trash. Wash hands thoroughly with soap and water. 2. You may remove the patch earlier than 72 hours if you experience unpleasant side effects which may include dry mouth, dizziness or visual disturbances. 3. Avoid touching the patch. Wash your hands with soap and water after contact with the patch.

## 2024-02-15 NOTE — Op Note (Signed)
 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   ASSISTANT: Almeda Rummer, PA   ANESTHESIA:  Regional with sedation   INTRAVENOUS FLUIDS:  Per anesthesia flow sheet.   ESTIMATED BLOOD LOSS:  Minimal.   COMPLICATIONS:  None.   SPECIMENS:  none   TOURNIQUET TIME:    Total Tourniquet Time Documented: Upper Arm (Right) - 23 minutes Total: Upper Arm (Right) - 23 minutes    DISPOSITION:  Stable to PACU.   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  He underwent extensive conservative management including splinting and activity modification which failed to relieve symptoms.  He also underwent dynamic ultrasound to confirm instability of the ECU tendon.  Based upon his workup and symptoms refractory to conservative care, patient was indicated for right wrist extensor carpi ulnaris subsheath reconstruction.  Risks and benefits of surgery were discussed including the risks of infection, bleeding, scarring, stiffness, nerve injury, vascular injury, tendon injury, need for subsequent operation, persistent tendon instability, recurrence.  He voiced understanding of these risks and elected to proceed.  OPERATIVE COURSE: Patient was seen and identified in the preoperative area and marked appropriately.  Surgical consent had been signed. Preoperative IV antibiotic prophylaxis was given. He was transferred to the operating room  and placed in supine position with the Right upper extremity on an arm board.  Sedation was induced by the anesthesiologist. A regional block had been performed by anesthesia in preoperative holding.    Right upper extremity was prepped and draped in normal sterile orthopedic fashion.  A surgical pause was performed between the surgeons, anesthesia, and operating room staff and all were in agreement as to the patient, procedure, and site of procedure.  Tourniquet was placed and padded appropriately to the right upper arm.  The arm was exsanguinated and the tourniquet inflated to 250 mmHg.  A curvilinear incision was designed along the ulnar border of the distal ulna, centered over the ECU tendon.  Dissection was carried down sharply through subcutaneous tissues.  Care was taken to identify and protect the dorsal sensory branch of the ulnar nerve.  The ECU tendon was then identified.  Extensor retinaculum was elevated creating a radial based flap for subs sheath reconstruction.  Under direct visualization, the ECU tendon was observed to sublux out of the ulnar groove with forearm rotation, complaint preoperative basis.  Subsheath was found to be attenuated and incompetent.  A radially based flap of the extensor retinaculum was elevated overlying the fifth extensor compartment and mobilized distally.  The retinacular flap was then passed deep to the ECU tendon and sutured to the periosteum of the radial aspect of the ECU groove, creating a stable sheath over the tendons.  3-0 Ethibond was then utilized to secure the reconstruction in figure-of-eight fashion.  Tendon was checked and allowed to glide through the need to reconstruct sheath without subluxation through full range of wrist and forearm motion.  The ECU tendon remained stable throughout supination or pronation, no subluxation or gapping was noted.  The tourniquet was deflated at 23 minutes.  Copious irrigation was performed.  Bipolar electrocautery  was utilized for hemostasis.  Standard closure performed in layers utilizing 3-0 Vicryl for the subcutaneous tissue and running 3-0 Monocryl for the skin surface.  Sterile dressings were applied followed by application of a long-arm splint with the arm held in a slightly pronated position to protect the repair.  Fingertips were pink with brisk capillary refill after deflation of tourniquet.  The operative drapes were broken down.  The patient was awoken from anesthesia safely and taken to PACU in stable condition.   Post-operative plan: The patient will recover in the post-anesthesia care unit and then be discharged home.  The patient will be non weight bearing on the right upper extremity in a long-arm splint with the forearm in slight pronation.   I will see the patient back in the office in 2 weeks for postoperative followup.  Discharge instructions were provided for appropriate dressing maintenance and pain control.  Jennafer Gladue, MD Electronically signed, 02/15/24

## 2024-02-15 NOTE — Transfer of Care (Signed)
 Immediate Anesthesia Transfer of Care Note  Patient: Clinton Fisher  Procedure(s) Performed: REPAIR, TENDON, EXTENSOR (Right: Hand)  Patient Location: PACU  Anesthesia Type:General and Regional  Level of Consciousness: awake, alert , and oriented  Airway & Oxygen Therapy: Patient Spontanous Breathing and Patient connected to face mask oxygen  Post-op Assessment: Report given to RN and Post -op Vital signs reviewed and stable  Post vital signs: Reviewed and stable  Last Vitals:  Vitals Value Taken Time  BP 116/76 02/15/24 11:37  Temp    Pulse 71 02/15/24 11:40  Resp 11 02/15/24 11:40  SpO2 96 % 02/15/24 11:40  Vitals shown include unfiled device data.  Last Pain:  Vitals:   02/15/24 0838  TempSrc: Temporal  PainSc: 0-No pain      Patients Stated Pain Goal: 3 (02/15/24 9161)  Complications: No notable events documented.

## 2024-02-15 NOTE — Telephone Encounter (Signed)
 Pt's mom called stating that the Pt (Clinton Fisher) who just had surgery today states his hand is so swollen that he can not move his fingers. They had took off the ace bandage and he has had no relief. I advised her to take pictures of his hand and send through MyChart. I have spoken to Autumn H and made her aware of the situation.

## 2024-02-15 NOTE — Anesthesia Procedure Notes (Signed)
 Procedure Name: LMA Insertion Date/Time: 02/15/2024 10:25 AM  Performed by: Burnard Rosaline HERO, CRNAPre-anesthesia Checklist: Patient identified, Emergency Drugs available, Suction available and Patient being monitored Patient Re-evaluated:Patient Re-evaluated prior to induction Oxygen Delivery Method: Circle system utilized Preoxygenation: Pre-oxygenation with 100% oxygen Induction Type: IV induction Ventilation: Mask ventilation without difficulty LMA: LMA inserted LMA Size: 4.0 Number of attempts: 1 Airway Equipment and Method: Bite block Placement Confirmation: positive ETCO2, CO2 detector and breath sounds checked- equal and bilateral Tube secured with: Tape Dental Injury: Teeth and Oropharynx as per pre-operative assessment

## 2024-02-15 NOTE — Anesthesia Postprocedure Evaluation (Signed)
 Anesthesia Post Note  Patient: Clinton Fisher  Procedure(s) Performed: REPAIR, TENDON, EXTENSOR (Right: Hand)     Patient location during evaluation: PACU Anesthesia Type: General Level of consciousness: awake and alert, oriented and patient cooperative Pain management: pain level controlled (no pain) Vital Signs Assessment: post-procedure vital signs reviewed and stable Respiratory status: spontaneous breathing, nonlabored ventilation and respiratory function stable Cardiovascular status: blood pressure returned to baseline and stable Postop Assessment: no apparent nausea or vomiting, adequate PO intake and able to ambulate Anesthetic complications: no   No notable events documented.  Last Vitals:  Vitals:   02/15/24 1200 02/15/24 1212  BP: 130/85 (!) 140/87  Pulse: 73 72  Resp: 16 16  Temp:    SpO2: 96% 96%    Last Pain:  Vitals:   02/15/24 1212  TempSrc:   PainSc: 0-No pain                 Jonah Nestle,E. Britnie Colville

## 2024-02-15 NOTE — Interval H&P Note (Signed)
 History and Physical Interval Note:  02/15/2024 8:42 AM  Clinton Fisher  has presented today for surgery, with the diagnosis of RIGHT WRIST EXTENSOR CARPI ULNARIS INSTABILITY.  The various methods of treatment have been discussed with the patient and family. After consideration of risks, benefits and other options for treatment, the patient has consented to  Procedure(s) with comments: REPAIR, TENDON, EXTENSOR (Right) - RIGHT WRIST EXTENSOR CARPI ULNARIS SUBSHEATH RECONSTRUCTION as a surgical intervention.  The patient's history has been reviewed, patient examined, no change in status, stable for surgery.  I have reviewed the patient's chart and labs.  Questions were answered to the patient's satisfaction.     Mackson Botz

## 2024-02-16 ENCOUNTER — Encounter (HOSPITAL_BASED_OUTPATIENT_CLINIC_OR_DEPARTMENT_OTHER): Payer: Self-pay | Admitting: Orthopedic Surgery

## 2024-02-18 ENCOUNTER — Ambulatory Visit: Payer: MEDICAID | Admitting: Orthopedic Surgery
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
## 2024-02-28 ENCOUNTER — Ambulatory Visit (INDEPENDENT_AMBULATORY_CARE_PROVIDER_SITE_OTHER): Payer: MEDICAID | Admitting: Rehabilitative and Restorative Service Providers"

## 2024-02-28 ENCOUNTER — Encounter: Payer: Self-pay | Admitting: Rehabilitative and Restorative Service Providers"

## 2024-02-28 ENCOUNTER — Encounter (HOSPITAL_BASED_OUTPATIENT_CLINIC_OR_DEPARTMENT_OTHER): Payer: MEDICAID | Admitting: Family Medicine

## 2024-02-28 ENCOUNTER — Ambulatory Visit (INDEPENDENT_AMBULATORY_CARE_PROVIDER_SITE_OTHER): Payer: MEDICAID | Admitting: Orthopedic Surgery

## 2024-02-28 DIAGNOSIS — M25531 Pain in right wrist: Secondary | ICD-10-CM | POA: Diagnosis not present

## 2024-02-28 DIAGNOSIS — M6281 Muscle weakness (generalized): Secondary | ICD-10-CM | POA: Diagnosis not present

## 2024-02-28 DIAGNOSIS — M25631 Stiffness of right wrist, not elsewhere classified: Secondary | ICD-10-CM

## 2024-02-28 DIAGNOSIS — R600 Localized edema: Secondary | ICD-10-CM

## 2024-02-28 DIAGNOSIS — S63091A Other subluxation of right wrist and hand, initial encounter: Secondary | ICD-10-CM

## 2024-02-28 NOTE — Progress Notes (Unsigned)
   Clinton Fisher - 33 y.o. male MRN [id number]  Date of birth: 1990/08/16  Office Visit Note: Visit Date: 02/28/2024 PCP: Knute Thersia Bitters, FNP Referred by: Knute Thersia Bitters, *  Subjective:  HPI: Clinton Fisher is a 33 y.o. male who presents today for follow up approx 2 weeks status post Right wrist extensor carpi ulnar subsheath reconstruction.  He is doing well overall, pain is controlled at rest.  Has been compliant with the splinting of the straps.  Pertinent ROS were reviewed with the patient and found to be negative unless otherwise specified above in HPI.   Assessment & Plan: Visit Diagnoses: No diagnosis found.  Plan: He is doing well today.  Occupational Therapy will see him for fabrication of a [place] orthosis for additional 4 weeks.  I did emphasize he do elbow range of motion however no rotation of the wrist currently.  Digital range of motion is encouraged.  Follow-up in approximate 2 weeks for recheck.  Follow-up: No follow-ups on file.   Meds & Orders: No orders of the defined types were placed in this encounter.  No orders of the defined types were placed in this encounter.    Procedures: No procedures performed       Objective:   Vital Signs: There were no vitals taken for this visit.  Ortho Exam Right wrist/forearm Healing dorsal ulnar incision, skin edges well-approximated without erythema or drainage Sensation intact distally, hand remains warm well-perfused, digital range of motion is well-preserved, near composite fist without restriction  Imaging: No results found.   Anshul Afton Alderton, M.D. [facility], Hand Surgery

## 2024-02-29 ENCOUNTER — Encounter (HOSPITAL_BASED_OUTPATIENT_CLINIC_OR_DEPARTMENT_OTHER): Payer: Self-pay | Admitting: Family Medicine

## 2024-02-29 ENCOUNTER — Ambulatory Visit (INDEPENDENT_AMBULATORY_CARE_PROVIDER_SITE_OTHER): Payer: MEDICAID | Admitting: Family Medicine

## 2024-02-29 ENCOUNTER — Telehealth: Payer: Self-pay

## 2024-02-29 VITALS — BP 117/84 | HR 66 | Temp 98.1°F | Ht 69.0 in | Wt 205.6 lb

## 2024-02-29 DIAGNOSIS — Z23 Encounter for immunization: Secondary | ICD-10-CM | POA: Diagnosis not present

## 2024-02-29 DIAGNOSIS — Z0000 Encounter for general adult medical examination without abnormal findings: Secondary | ICD-10-CM

## 2024-02-29 DIAGNOSIS — Z13220 Encounter for screening for lipoid disorders: Secondary | ICD-10-CM | POA: Diagnosis not present

## 2024-02-29 NOTE — Telephone Encounter (Signed)
 Called and appt was made for 6 weeks

## 2024-02-29 NOTE — Telephone Encounter (Signed)
 Per Dr erwin pt needs f/u in 6 weeks

## 2024-02-29 NOTE — Progress Notes (Signed)
 Subjective:   Clinton Fisher [birth year] 02/29/2024  CC: Chief Complaint  Patient presents with   Annual Exam    Patient is here today for his physical. Pt had recent surgery on right wrist about 2 weeks ago and has been taking pain meds around the clock to help out. Denies any main concerns for today.    HPI: Clinton Fisher is a 33 y.o. male who presents for a routine health maintenance exam.  Labs collected at time of visit.   HEALTH SCREENINGS: - Vision Screening: up to date - Dental Visits: up to date - Testicular Exam: Declined - STD Screening: Declined - PSA (50+): Not applicable  No results found for: PSA1, PSA   - Colonoscopy (45+): Not applicable  Discussed with patient purpose of the colonoscopy is to detect colon cancer at curable precancerous or early stages  - AAA Screening: Not applicable  Men age 58-75 who have ever smoked - Lung Cancer screening with low-dose CT: Not applicable-  Adults age 8-80 who are current cigarette smokers or quit within the last 15 years. Must have 20 pack year history.   Depression and Anxiety Screen done today and results listed below:     02/29/2024    8:47 AM 12/31/2023   12:53 PM 08/31/2023    8:10 AM 04/17/2023    9:18 AM 02/19/2023    9:11 AM  Depression screen PHQ 2/9  Decreased Interest 0 0 0 0 0  Down, Depressed, Hopeless 0 0 0 0 0  PHQ - 2 Score 0 0 0 0 0  Altered sleeping 3 0 0 0 0  Tired, decreased energy 1 0 0 0 0  Change in appetite 0 0 0 0 0  Feeling bad or failure about yourself  0 0 0 0 0  Trouble concentrating 1 0 0 0 0  Moving slowly or fidgety/restless 0 0 0 0 0  Suicidal thoughts 0 0 0 0 0  PHQ-9 Score 5 0 0 0 0  Difficult doing work/chores Not difficult at all Not difficult at all Not difficult at all Not difficult at all Not difficult at all      02/29/2024    8:48 AM 12/31/2023   12:54 PM 08/31/2023    8:10 AM 04/17/2023    9:18 AM  GAD 7 : Generalized Anxiety Score  Nervous, Anxious, on Edge 0 0 0  0  Control/stop worrying 0 0 0 0  Worry too much - different things 0 0 0 0  Trouble relaxing 0 0 0 0  Restless 0 0 0 0  Easily annoyed or irritable 0 0 0 0  Afraid - awful might happen 0 0 0 0  Total GAD 7 Score 0 0 0 0  Anxiety Difficulty Not difficult at all Not difficult at all Not difficult at all Not difficult at all    IMMUNIZATIONS:  - Tdap: Tetanus vaccination status reviewed: last tetanus booster within 10 years. - Influenza: Postponed to flu season - Pneumovax: Not applicable - Prevnar: Not applicable - Shingrix vaccine (50+): Not applicable   Past medical history, surgical history, medications, allergies, family history and social history reviewed with patient today and changes made to appropriate areas of the chart.   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  Manic affective disorder with recurrent episode (HCC)    Seasonal allergies    sore throat 07/29/2013   Skin problem 12/07/2021    Past Surgical History:  Procedure Laterality Date   CYST EXCISION PERINEAL N/A 07/12/2023   Procedure: SCROTUM EXPLORATION-excision of penile sebaceous cyst;  Surgeon: Sherrilee Belvie CROME, MD;  Location: [facility];  Service: Urology;  Laterality: N/A;   FOOT FOREIGN BODY REMOVAL Right 01/19/2011   KNEE ARTHROSCOPY Right 08/01/2013   Procedure: ARTHROSCOPY RIGHT KNEE, Chondroplasty, Excision of Plica;  Surgeon: Norleen CROME Gavel, MD;  Location: [facility];  Service: Orthopedics;  Laterality: Right;   REPAIR EXTENSOR TENDON Right 02/15/2024   Procedure: REPAIR, TENDON, EXTENSOR;  Surgeon: Arlinda Buster, MD;  Location: [facility];  Service: Orthopedics;  Laterality: Right;  RIGHT WRIST EXTENSOR CARPI ULNARIS SUBSHEATH RECONSTRUCTION   SCROTAL EXPLORATION N/A 11/09/2022   Procedure:  SCROTUM EXPLORATION-excision of scrotal sebaceous cyst;  Surgeon: Sherrilee Belvie CROME, MD;  Location: [facility];  Service: Urology;  Laterality: N/A;    Current Outpatient Medications on File Prior to Visit  Medication Sig   acetaminophen  (TYLENOL ) 325 MG tablet Take 650 mg by mouth every 6 (six) hours as needed for mild pain (pain score 1-3) or moderate pain (pain score 4-6).   oxyCODONE  (ROXICODONE ) 5 MG immediate release tablet Take 1 tablet (5 mg total) by mouth every 6 (six) hours as needed.   pseudoephedrine (SUDAFED) 30 MG tablet Take 30 mg by mouth every 4 (four) hours as needed for congestion.   No current facility-administered medications on file prior to visit.    Allergies  Allergen Reactions   Trazodone And Nefazodone Other (See Comments)    priapism   Amoxicillin Diarrhea   Amoxicillin-Pot Clavulanate Diarrhea    Diarrhea    Clavulanic Acid Other (See Comments)    Unknown     Social History   Socioeconomic History   Marital status: Single    Spouse name: Not on file   Number of children: 1   Years of education: Not on file   Highest education level: Not on file  Occupational History   Not on file  Tobacco Use   Smoking status: Every Day    Types: E-cigarettes    Passive exposure: Current   Smokeless tobacco: Former  Building services engineer status: Every Day  Substance and Sexual Activity   Alcohol use: Yes    Comment: every 3 days   Drug use: No   Sexual activity: Not Currently  Other Topics Concern   Not on file  Social History Narrative   Not on file   Social Drivers of Health   Financial Resource Strain: Low Risk  (08/31/2023)   Overall Financial Resource Strain (CARDIA)    Difficulty of Paying Living Expenses: Not hard at all  Food Insecurity: No Food Insecurity (08/31/2023)   Hunger Vital Sign    Worried About Running Out of Food in the Last Year: Never true    Ran Out of Food in the Last Year: Never true  Transportation Needs: No Transportation  Needs (08/31/2023)   PRAPARE - Administrator, Civil Service (Medical): No    Lack of Transportation (Non-Medical): No  Physical Activity: Sufficiently Active (08/31/2023)   Exercise Vital Sign    Days of Exercise per Week: 7 days    Minutes of Exercise per Session: 60 min  Stress: No Stress Concern Present (08/31/2023)   [employer] of Occupational Health -  Occupational Stress Questionnaire    Feeling of Stress : Not at all  Social Connections: Socially Isolated (08/31/2023)   Social Connection and Isolation Panel    Frequency of Communication with Friends and Family: More than three times a week    Frequency of Social Gatherings with Friends and Family: More than three times a week    Attends Religious Services: Never    Database administrator or Organizations: No    Attends Banker Meetings: Never    Marital Status: Never married  Intimate Partner Violence: Not At Risk (08/31/2023)   Humiliation, Afraid, Rape, and Kick questionnaire    Fear of Current or Ex-Partner: No    Emotionally Abused: No    Physically Abused: No    Sexually Abused: No   Social History   Tobacco Use  Smoking Status Every Day   Types: E-cigarettes   Passive exposure: Current  Smokeless Tobacco Former   Social History   Substance and Sexual Activity  Alcohol Use Yes   Comment: every 3 days     Family History  Problem Relation Age of Onset   Depression Mother    Colon polyps Mother      ROS: Denies fever, fatigue, unexplained weight loss/gain, CP, SHOB, and palpatitations. Denies neurological deficits, gastrointestinal and/or genitourinary complaints, and skin changes.   Objective:   Today's Vitals   02/29/24 0842  BP: 117/84  Pulse: 66  Temp: 98.1 F (36.7 C)  TempSrc: Oral  SpO2: 98%  Weight: 205 lb 9.6 oz (93.3 kg)  Height: 5' 9 (1.753 m)    GENERAL APPEARANCE: Well-appearing, in NAD. Well nourished.  SKIN: Pink, warm and dry. Turgor normal. No rash,  lesion, ulceration, or ecchymoses. Hair evenly distributed.  HEENT: HEAD: Normocephalic.  EYES: PERRLA. EOMI. Lids intact w/o defect. Sclera white, Conjunctiva pink w/o exudate.  EARS: External ear w/o redness, swelling, masses or lesions. EAC clear. TM's intact, translucent w/o bulging, appropriate landmarks visualized. Appropriate acuity to conversational tones.  NOSE: Septum midline w/o deformity. Nares patent, mucosa pink and non-inflamed w/o drainage. No sinus tenderness.  THROAT: Uvula midline. Oropharynx clear. Tonsils non-inflamed w/o exudate. Oral mucosa pink and moist.  NECK: Supple, Trachea midline. Full ROM w/o pain or tenderness. No lymphadenopathy. Thyroid non-tender w/o enlargement or palpable masses.  RESPIRATORY: Chest wall symmetrical w/o masses. Respirations even and non-labored. Breath sounds clear to auscultation bilaterally. No wheezes, rales, rhonchi, or crackles. CARDIAC: S1, S2 present, regular rate and rhythm. No gallops, murmurs, rubs, or clicks. PMI w/o lifts, heaves, or thrills. No carotid bruits. Capillary refill <2 seconds. Peripheral pulses 2+ bilaterally. GI: Abdomen soft w/o distention. Normoactive bowel sounds. No palpable masses or tenderness. No guarding or rebound tenderness. Liver and spleen w/o tenderness or enlargement. No CVA tenderness.  GU: Pt deferred exam. MSK: Muscle tone and strength appropriate for age, w/o atrophy or abnormal movement. Splint present to right wrist.  EXTREMITIES: Active ROM intact, w/o tenderness, crepitus, or contracture. No obvious joint deformities or effusions. No clubbing, edema, or cyanosis.  NEUROLOGIC: CN's II-XII intact. Motor strength symmetrical with no obvious weakness. No sensory deficits. DTR 2+ symmetric bilaterally. Steady, even gait.  PSYCH/MENTAL STATUS: Alert, oriented x 3. Cooperative, appropriate mood and affect.    Assessment & Plan:  1. Annual physical exam (Primary) Discussed preventative screenings,  vaccines, and healthy lifestyle with patient.Will obtain fasting labs today.    - CBC with Differential/Platelet - Comprehensive metabolic panel with GFR - Lipid panel  2. Screening  for lipid disorders  - Lipid panel   3. Immunization due Discussed HPV vaccine series with patinet and NCIR checked. Patient would like to get series completed. Will receive first vaccine today and return in 2 months for 2nd.   Orders Placed This Encounter  Procedures   HPV 9-valent vaccine,Recombinat   CBC with Differential/Platelet   Comprehensive metabolic panel with GFR   Lipid panel    PATIENT COUNSELING: - Encouraged to adjust caloric intake to maintain or achieve ideal body weight, to reduce intake of dietary saturated fat and total fat, to limit sodium intake by avoiding high sodium foods and not adding table salt, and to maintain adequate dietary potassium and calcium preferably from fresh fruits, vegetables, and low-fat dairy products.   - Advised to avoid cigarette smoking. - Discussed with the patient that most people either abstain from alcohol or drink within safe limits (<=14/week and <=4 drinks/occasion for males, <=7/weeks and <= 3 drinks/occasion for females) and that the risk for alcohol disorders and other health effects rises proportionally with the number of drinks per week and how often a drinker exceeds daily limits. - Discussed cessation/primary prevention of drug use and availability of treatment for abuse.   - Stressed the importance of regular exercise - Injury prevention: Discussed safety belts, safety helmets, smoke detector, smoking near bedding or upholstery.  - Dental health: Discussed importance of regular tooth brushing, flossing, and dental visits.  - Sexuality: Discussed sexually transmitted diseases, partner selection, use of condoms, avoidance of unintended pregnancy  and contraceptive alternatives.   NEXT PREVENTATIVE PHYSICAL DUE IN 1 YEAR.  Return for 2 months  for 2nd HPV Nurse Only Visit; 1 year for AE.  Patient to reach out to office if new, worrisome, or unresolved symptoms arise or if no improvement in patient's condition. Patient verbalized understanding and is agreeable to treatment plan. All questions answered to patient's satisfaction.    Thersia Schuyler Stark, [place]

## 2024-03-01 LAB — COMPREHENSIVE METABOLIC PANEL WITH GFR
ALT: 28 IU/L (ref 0–44)
AST: 21 IU/L (ref 0–40)
Albumin: 4.6 g/dL (ref 4.1–5.1)
Alkaline Phosphatase: 102 IU/L (ref 44–121)
BUN/Creatinine Ratio: 14 (ref 9–20)
BUN: 13 mg/dL (ref 6–20)
Bilirubin Total: 0.5 mg/dL (ref 0.0–1.2)
CO2: 21 mmol/L (ref 20–29)
Calcium: 9.5 mg/dL (ref 8.7–10.2)
Chloride: 102 mmol/L (ref 96–106)
Creatinine, Ser: 0.9 mg/dL (ref 0.76–1.27)
Globulin, Total: 2.7 g/dL (ref 1.5–4.5)
Glucose: 83 mg/dL (ref 70–99)
Potassium: 4.1 mmol/L (ref 3.5–5.2)
Sodium: 141 mmol/L (ref 134–144)
Total Protein: 7.3 g/dL (ref 6.0–8.5)
eGFR: 115 mL/min/1.73 (ref >59)

## 2024-03-01 LAB — CBC WITH DIFFERENTIAL/PLATELET
Basophils Absolute: 0 x10E3/uL (ref 0.0–0.2)
Basos: 1 %
EOS (ABSOLUTE): 0.2 x10E3/uL (ref 0.0–0.4)
Eos: 3 %
Hematocrit: 49.5 % (ref 37.5–51.0)
Hemoglobin: 16.4 g/dL (ref 13.0–17.7)
Immature Grans (Abs): 0 x10E3/uL (ref 0.0–0.1)
Immature Granulocytes: 0 %
Lymphocytes Absolute: 2.2 x10E3/uL (ref 0.7–3.1)
Lymphs: 27 %
MCH: 29.7 pg (ref 26.6–33.0)
MCHC: 33.1 g/dL (ref 31.5–35.7)
MCV: 90 fL (ref 79–97)
Monocytes Absolute: 0.8 x10E3/uL (ref 0.1–0.9)
Monocytes: 10 %
Neutrophils Absolute: 4.9 x10E3/uL (ref 1.4–7.0)
Neutrophils: 59 %
Platelets: 330 x10E3/uL (ref 150–450)
RBC: 5.52 x10E6/uL (ref 4.14–5.80)
RDW: 12.9 % (ref 11.6–15.4)
WBC: 8.1 x10E3/uL (ref 3.4–10.8)

## 2024-03-01 LAB — LIPID PANEL
Chol/HDL Ratio: 4 ratio (ref 0.0–5.0)
Cholesterol, Total: 155 mg/dL (ref 100–199)
HDL: 39 mg/dL — ABNORMAL LOW (ref >39)
LDL Chol Calc (NIH): 94 mg/dL (ref 0–99)
Triglycerides: 120 mg/dL (ref 0–149)
VLDL Cholesterol Cal: 22 mg/dL (ref 5–40)

## 2024-03-03 ENCOUNTER — Ambulatory Visit (HOSPITAL_BASED_OUTPATIENT_CLINIC_OR_DEPARTMENT_OTHER): Payer: Self-pay | Admitting: Family Medicine

## 2024-03-03 NOTE — Progress Notes (Signed)
 Hi Benjie,  Your labs look good. Keep up a heart healthy diet with regular exercise. We will recheck these in 1 year.

## 2024-03-13 ENCOUNTER — Telehealth (HOSPITAL_BASED_OUTPATIENT_CLINIC_OR_DEPARTMENT_OTHER): Payer: Self-pay | Admitting: *Deleted

## 2024-03-13 NOTE — Telephone Encounter (Signed)
 Copied from CRM #[id number]. Topic: Appointments - Appointment Info/Confirmation >> Mar 13, 2024  9:11 AM Clinton Fisher wrote: Cyst on butt cheek - Can he see a different PCP.Clinton Fisher isn't available for sooner appt.

## 2024-03-13 NOTE — Telephone Encounter (Signed)
 Please let patient know soonest with Thersia is Monday can use same day spot if not wanting to wait can go to urgent care

## 2024-03-17 ENCOUNTER — Ambulatory Visit (HOSPITAL_BASED_OUTPATIENT_CLINIC_OR_DEPARTMENT_OTHER): Payer: MEDICAID | Admitting: Family Medicine

## 2024-03-17 ENCOUNTER — Encounter (HOSPITAL_BASED_OUTPATIENT_CLINIC_OR_DEPARTMENT_OTHER): Payer: Self-pay | Admitting: Family Medicine

## 2024-03-17 ENCOUNTER — Ambulatory Visit (INDEPENDENT_AMBULATORY_CARE_PROVIDER_SITE_OTHER): Payer: MEDICAID | Admitting: Family Medicine

## 2024-03-17 VITALS — BP 119/88 | HR 81 | Ht 69.0 in | Wt 211.6 lb

## 2024-03-17 DIAGNOSIS — L089 Local infection of the skin and subcutaneous tissue, unspecified: Secondary | ICD-10-CM | POA: Insufficient documentation

## 2024-03-17 MED ORDER — SULFAMETHOXAZOLE-TRIMETHOPRIM 800-160 MG PO TABS
1.0000 | ORAL_TABLET | Freq: Two times a day (BID) | ORAL | 0 refills | Status: DC
Start: 2024-03-17 — End: 2024-03-25

## 2024-03-17 NOTE — Progress Notes (Signed)
    Procedures performed today:    None.  Independent interpretation of notes and tests performed by another provider:   None.  Brief History, Exam, Impression, and Recommendations:    BP 119/88 (BP Location: Left Arm, Patient Position: Sitting, Cuff Size: Normal)   Pulse 81   Ht 5' 9 (1.753 m)   Wt 211 lb 9.6 oz (96 kg)   SpO2 96%   BMI 31.25 kg/m   Discussed the use of AI scribe software for clinical note transcription with the patient, who gave verbal consent to proceed.  History of Present Illness Clinton Fisher is a 33 year old male who presents with a painful swelling near the perineal area following recent surgery.  Approximately one and a half months ago, he underwent surgery and was prescribed oxycontin  for pain management. After the medication ran out, he experienced a period of inactivity, primarily eating and sleeping, which he believes contributed to the development of a painful swelling.  The swelling is located on the inside edge of his buttock, a couple of inches left of the perineum. He first noticed the swelling about a week and a half ago. The area is difficult for him to see and access, requiring the use of a mirror for inspection. He has attempted to drain the swelling twice due to significant pain, describing the process as 'incredibly difficult' due to the location. Despite these attempts, the area remains hard and painful. He reports a high pain tolerance due to chronic injuries, which sometimes makes it difficult for him to assess pain levels.  No fevers, chills, or sweats, although he is uncertain about these symptoms. He has had cysts surgically removed in the past and reports that this swelling is showing patterns similar to those previous cysts.  He has had allergic reactions to amoxicillin and Augmentin in the past, although these reactions occurred a long time ago.  On exam, patient is in no acute distress, vital signs stable.  Near medial aspect of  gluteal crease inferior to left buttock, there is a subcentimeter area of mild erythema and slight firmness on palpation.  Central area is slightly firm, no fluctuance noted.  Soft tissue infection Assessment & Plan: Suspect that patient likely did have larger area of swelling/abscess.  At this time, the area is quite small without obvious fluctuance noted today.  There is some mild surrounding erythema.  Given history and appearance, feel that beginning initially with antibiotic therapy would be warranted.  Ultimately, this may lead to complete resolution of current issue, although it is also possible that there is recurrence of abscess.  As a result, would recommend for close follow-up later this week to monitor progress.  Can return sooner as needed.   Other orders -     Sulfamethoxazole -Trimethoprim ; Take 1 tablet by mouth 2 (two) times daily.  Dispense: 14 tablet; Refill: 0   ___________________________________________ Germaine Shenker de [place], MD, ABFM, [facility] Primary [facility] and Sports Medicine [facility]

## 2024-03-17 NOTE — Patient Instructions (Signed)
  Medication Instructions:  Your physician recommends that you continue on your current medications as directed. Please refer to the Current Medication list given to you today. --If you need a refill on any your medications before your next appointment, please call your pharmacy first. If no refills are authorized on file call the office.--   Follow-Up: Your next appointment:   Your physician recommends that you schedule a follow-up appointment in: later this week  with Thersia Lanius will receive a text message or e-mail with a link to a survey about your care and experience with us  today! We would greatly appreciate your feedback!   Thanks for letting us  be apart of your health journey!!  Primary Care and Sports Medicine   Dr. Quintin sheerer Peru   We encourage you to activate your patient portal called MyChart.  Sign up information is provided on this After Visit Summary.  MyChart is used to connect with patients for Virtual Visits (Telemedicine).  Patients are able to view lab/test results, encounter notes, upcoming appointments, etc.  Non-urgent messages can be sent to your provider as well. To learn more about what you can do with MyChart, please visit --  [email or web address].

## 2024-03-17 NOTE — Assessment & Plan Note (Signed)
 Suspect that patient likely did have larger area of swelling/abscess.  At this time, the area is quite small without obvious fluctuance noted today.  There is some mild surrounding erythema.  Given history and appearance, feel that beginning initially with antibiotic therapy would be warranted.  Ultimately, this may lead to complete resolution of current issue, although it is also possible that there is recurrence of abscess.  As a result, would recommend for close follow-up later this week to monitor progress.  Can return sooner as needed.

## 2024-03-25 ENCOUNTER — Ambulatory Visit (HOSPITAL_BASED_OUTPATIENT_CLINIC_OR_DEPARTMENT_OTHER): Payer: MEDICAID | Admitting: Family Medicine

## 2024-03-25 ENCOUNTER — Encounter (HOSPITAL_BASED_OUTPATIENT_CLINIC_OR_DEPARTMENT_OTHER): Payer: Self-pay | Admitting: Family Medicine

## 2024-03-25 VITALS — BP 125/86 | HR 89 | Ht 69.0 in | Wt 211.0 lb

## 2024-03-25 DIAGNOSIS — L089 Local infection of the skin and subcutaneous tissue, unspecified: Secondary | ICD-10-CM | POA: Diagnosis not present

## 2024-03-25 DIAGNOSIS — Z23 Encounter for immunization: Secondary | ICD-10-CM

## 2024-03-25 NOTE — Progress Notes (Signed)
 Subjective:   Clinton Fisher 1991/05/21 03/25/2024  Chief Complaint  Patient presents with   Cyst    Pt is here following up on recent cyst. States that the cyst is still there. States the pain has subsided.   HPI: Clinton Fisher presents today for re-assessment soft tissue skin infection to the left buttock.    Patient states he is here to follow up on cyst present to the perineum and left buttock area. He was seen by Dr. De [place] on 03/17/2024 for abscess present to left buttock. He was started on Bactrim  and has completed abx course with improvement of symptoms. 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 He does see dermatology at [facility] with  last visit approx. 1 year ago. He states pain has improved with antibiotic course. He states the area has drained at least 2-3 times with manipulation by patient. Denies current drainage or bleeding, fever/chills.    The following portions of the patient's history were reviewed and updated as appropriate: past medical history, past surgical history, family history, social history, allergies, medications, and problem list.   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 02/15/2024   Penile cyst 07/12/2023   Class 1 obesity due to excess calories without serious comorbidity with body mass index (BMI) of 31.0 to 31.9 in adult 02/28/2023   Scrotal sebaceous cyst 11/09/2022   Tendinitis of flexor tendon of both hands 07/19/2022   Hemorrhoids 02/25/2021   Chronic pain of right knee 01/11/2021   Bipolar 1 disorder (HCC) 10/18/2018   Tobacco use disorder 08/20/2017   GAD (generalized anxiety disorder) 09/25/2016   Past Medical History:  Diagnosis Date   Abrasion of index finger 07/29/2013   left   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   Manic affective disorder with recurrent episode (HCC)    Seasonal allergies    sore throat 07/29/2013   Skin problem 12/07/2021   Past Surgical History:  Procedure Laterality Date   CYST EXCISION PERINEAL N/A 07/12/2023   Procedure: SCROTUM EXPLORATION-excision of penile sebaceous cyst;  Surgeon: Sherrilee Belvie CROME, MD;  Location: [facility];  Service: Urology;  Laterality: N/A;   FOOT FOREIGN BODY REMOVAL Right 01/19/2011   KNEE ARTHROSCOPY Right 08/01/2013   Procedure: ARTHROSCOPY RIGHT KNEE, Chondroplasty, Excision of Plica;  Surgeon: Norleen CROME Gavel, MD;  Location: [facility];  Service: Orthopedics;  Laterality: Right;   REPAIR EXTENSOR TENDON Right 02/15/2024   Procedure: REPAIR, TENDON, EXTENSOR;  Surgeon: Arlinda Buster, MD;  Location: [facility];  Service: Orthopedics;  Laterality: Right;  RIGHT WRIST EXTENSOR CARPI ULNARIS SUBSHEATH RECONSTRUCTION   SCROTAL EXPLORATION N/A 11/09/2022   Procedure: SCROTUM EXPLORATION-excision of scrotal sebaceous cyst;  Surgeon: Sherrilee Belvie CROME, MD;  Location: [facility];  Service: Urology;  Laterality: N/A;   Family History  Problem Relation Age of Onset   Depression Mother    Colon polyps Mother    Outpatient Medications Prior to Visit  Medication Sig Dispense Refill   acetaminophen  (TYLENOL ) 325 MG tablet Take 650 mg by mouth every 6 (six) hours as needed for mild pain (pain score 1-3) or moderate pain (pain score 4-6).     pseudoephedrine (SUDAFED) 30  MG tablet Take 30 mg by mouth every 4 (four) hours as needed for congestion.     sulfamethoxazole -trimethoprim  (BACTRIM  DS) 800-160 MG tablet Take 1 tablet by mouth 2 (two) times daily. 14 tablet 0   No facility-administered medications prior to visit.   Allergies  Allergen Reactions   Trazodone And Nefazodone Other (See Comments)    priapism   Amoxicillin Diarrhea   Amoxicillin-Pot Clavulanate  Diarrhea    Diarrhea    Clavulanic Acid Other (See Comments)    Unknown     ROS: A complete ROS was performed with pertinent positives/negatives noted in the HPI. The remainder of the ROS are negative.    Objective:   Today's Vitals   03/25/24 1054  BP: 125/86  Pulse: 89  SpO2: 97%  Weight: 211 lb (95.7 kg)  Height: 5' 9 (1.753 m)    Physical Exam   GENERAL: Well-appearing, in NAD. Well nourished.  SKIN: Pink, warm and dry. Small well-healed area with scab without erythema, drainage or induration to left buttock.  Head: Normocephalic. NECK: Trachea midline. Full ROM w/o pain or tenderness. RESPIRATORY: Chest wall symmetrical. Respirations even and non-labored. MSK: Muscle tone and strength appropriate for age.   NEUROLOGIC: No motor or sensory deficits. Steady, even gait. C2-C12 intact.  PSYCH/MENTAL STATUS: Alert, oriented x 3. Cooperative, appropriate mood and affect.   Health Maintenance Due  Topic Date Due   HIV Screening  Never done   Hepatitis C Screening  Never done   COVID-19 Vaccine (3 - Pfizer risk series) 06/27/2022   HPV VACCINES (2 - 3-dose SCDM series) 03/28/2024    No results found for any visits on 03/25/24.  The ASCVD Risk score (Arnett DK, et al., 2019) failed to calculate for the following reasons:   The 2019 ASCVD risk score is only valid for ages 46 to 34     Assessment & Plan:  1. Soft tissue infection (Primary) Resolved. No signs of infection upon exam. Recommend follow up with Dermatology or PCP if area recurs.   2. Encounter for immunization Flu vaccine given in office today per patient request. VIS provided.  - Flu vaccine trivalent PF, 6mos and older(Flulaval,Afluria,Fluarix,Fluzone)  Return if symptoms worsen or fail to improve.    Patient to reach out to office if new, worrisome, or unresolved symptoms arise or if no improvement in patient's condition. Patient verbalized understanding and is agreeable to treatment plan. All  questions answered to patient's satisfaction.    Thersia Schuyler Stark, [place]

## 2024-04-07 ENCOUNTER — Ambulatory Visit (INDEPENDENT_AMBULATORY_CARE_PROVIDER_SITE_OTHER): Payer: MEDICAID | Admitting: Orthopedic Surgery

## 2024-04-07 DIAGNOSIS — S63091A Other subluxation of right wrist and hand, initial encounter: Secondary | ICD-10-CM

## 2024-04-07 NOTE — Progress Notes (Signed)
   ABIMELEC GROCHOWSKI - 33 y.o. male MRN [id number]  Date of birth: 30-Apr-1991  Office Visit Note: Visit Date: 04/07/2024 PCP: Knute Thersia Bitters, FNP Referred by: Knute Thersia Bitters, *  Subjective:  HPI: STORMY SABOL is a 33 y.o. male who presents today for follow up 8 weeks status post Right wrist extensor carpi ulnar subsheath reconstruction.  Currently utilizing Munster splint as instructed from OT.  Pain is well-controlled.  Pertinent ROS were reviewed with the patient and found to be negative unless otherwise specified above in HPI.   Assessment & Plan: Visit Diagnoses: No diagnosis found.  Plan: He is doing well overall.  At this juncture, would like to begin range of motion protocol of the wrist with occupational therapy.  Follow-up in 4 weeks for recheck.  At that juncture, should he demonstrate appropriate progress with range of motion, we can likely begin strengthening.  Follow-up: No follow-ups on file.   Meds & Orders: No orders of the defined types were placed in this encounter.  No orders of the defined types were placed in this encounter.    Procedures: No procedures performed       Objective:   Vital Signs: There were no vitals taken for this visit.  Ortho Exam Right wrist: - Munster splint in place, incisions are well-healed, minimal swelling, no significant tenderness, digital range of motion is well-preserved, sensation intact in all distributions, hand warm well-perfused  Imaging: No results found.   Quintella Mura Afton Alderton, M.D. [facility], Hand Surgery

## 2024-04-09 ENCOUNTER — Encounter (HOSPITAL_BASED_OUTPATIENT_CLINIC_OR_DEPARTMENT_OTHER): Payer: Self-pay | Admitting: Family Medicine

## 2024-04-09 ENCOUNTER — Ambulatory Visit (INDEPENDENT_AMBULATORY_CARE_PROVIDER_SITE_OTHER): Payer: MEDICAID | Admitting: Family Medicine

## 2024-04-09 VITALS — BP 121/101 | HR 113 | Ht 69.0 in | Wt 215.0 lb

## 2024-04-09 DIAGNOSIS — Z0189 Encounter for other specified special examinations: Secondary | ICD-10-CM | POA: Diagnosis not present

## 2024-04-09 DIAGNOSIS — W57XXXA Bitten or stung by nonvenomous insect and other nonvenomous arthropods, initial encounter: Secondary | ICD-10-CM

## 2024-04-09 DIAGNOSIS — M778 Other enthesopathies, not elsewhere classified: Secondary | ICD-10-CM | POA: Diagnosis not present

## 2024-04-09 DIAGNOSIS — S90869A Insect bite (nonvenomous), unspecified foot, initial encounter: Secondary | ICD-10-CM | POA: Diagnosis not present

## 2024-04-09 MED ORDER — MELOXICAM 15 MG PO TABS: 15.0000 mg | ORAL_TABLET | Freq: Every day | ORAL | 0 refills | Status: DC

## 2024-04-09 NOTE — Progress Notes (Unsigned)
 Acute Care Office Visit  Subjective:   Clinton Fisher [birth year] 04/09/2024  Chief Complaint  Patient presents with   Insect Bite    Pt has a bug bite on the inside of left foot and also on big toe on right foot. Also states he has some inflammation in left wrist.    HPI: Patient is requesting Chagas disease testing after insect bites to left interior of left foot and on right toe. He states the bites occurred on Friday and he began having flu like symptoms on Saturday and Sunday with diarrhea, fever, chills, congestion.   LEFT WRIST:  Nerve pain ongoing for several weeks. Patient believes he may have significant inflammation contributing to chronic pain. He has been wearing a wrist brace, ice and heat with rest without improvement. Patient has hx of tendinitis of flexor tendon in both hands with surgery to right wrist.   The following portions of the patient's history were reviewed and updated as appropriate: past medical history, past surgical history, family history, social history, allergies, medications, and problem list.   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 02/15/2024   Penile cyst 07/12/2023   Class 1 obesity due to excess calories without serious comorbidity with body mass index (BMI) of 31.0 to 31.9 in adult 02/28/2023   Scrotal sebaceous cyst 11/09/2022   Tendinitis of flexor tendon of both hands 07/19/2022   Hemorrhoids 02/25/2021   Chronic pain of right knee 01/11/2021   Bipolar 1 disorder (HCC) 10/18/2018   Tobacco use disorder 08/20/2017   GAD (generalized anxiety disorder) 09/25/2016   Past Medical History:  Diagnosis Date   Abrasion of index finger 07/29/2013   left   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   Manic affective disorder with recurrent  episode (HCC)    Seasonal allergies    sore throat 07/29/2013   Skin problem 12/07/2021   Past Surgical History:  Procedure Laterality Date   CYST EXCISION PERINEAL N/A 07/12/2023   Procedure: SCROTUM EXPLORATION-excision of penile sebaceous cyst;  Surgeon: Sherrilee Belvie CROME, MD;  Location: [facility];  Service: Urology;  Laterality: N/A;   FOOT FOREIGN BODY REMOVAL Right 01/19/2011   KNEE ARTHROSCOPY Right 08/01/2013   Procedure: ARTHROSCOPY RIGHT KNEE, Chondroplasty, Excision of Plica;  Surgeon: Norleen CROME Gavel, MD;  Location: [facility];  Service: Orthopedics;  Laterality: Right;   REPAIR EXTENSOR TENDON Right 02/15/2024   Procedure: REPAIR, TENDON, EXTENSOR;  Surgeon: Arlinda Buster, MD;  Location: [facility];  Service: Orthopedics;  Laterality: Right;  RIGHT WRIST EXTENSOR CARPI ULNARIS SUBSHEATH RECONSTRUCTION   SCROTAL EXPLORATION N/A 11/09/2022   Procedure: SCROTUM EXPLORATION-excision of scrotal sebaceous cyst;  Surgeon: Sherrilee Belvie CROME, MD;  Location: [facility];  Service: Urology;  Laterality: N/A;   Family History  Problem Relation Age of Onset   Depression Mother    Colon polyps Mother    Outpatient Medications Prior to Visit  Medication Sig Dispense Refill   acetaminophen  (TYLENOL ) 325 MG tablet Take 650 mg by mouth every 6 (six) hours as needed for mild pain (pain score 1-3) or moderate pain (pain score 4-6).     pseudoephedrine (SUDAFED) 30 MG tablet Take 30 mg by mouth every 4 (four) hours as needed for congestion.  No facility-administered medications prior to visit.   Allergies  Allergen Reactions   Trazodone And Nefazodone Other (See Comments)    priapism   Amoxicillin Diarrhea   Amoxicillin-Pot Clavulanate Diarrhea    Diarrhea    Clavulanic Acid Other (See Comments)    Unknown     ROS: A complete ROS was performed with pertinent positives/negatives noted in the HPI. The remainder of the ROS are negative.    Objective:    Today's Vitals   04/09/24 1601  BP: (!) 121/101  Pulse: (!) 113  SpO2: 97%  Weight: 215 lb (97.5 kg)  Height: 5' 9 (1.753 m)    GENERAL: Well-appearing, in NAD. Well nourished.  SKIN: Pink, warm and dry. No rash, lesion, ulceration, or ecchymoses.  Head: Normocephalic. NECK: Trachea midline. Full ROM w/o pain or tenderness. No lymphadenopathy.  EARS: Tympanic membranes are intact, translucent without bulging and without drainage. Appropriate landmarks visualized.  EYES: Conjunctiva clear without exudates. EOMI, PERRL, no drainage present.  NOSE: Septum midline w/o deformity. Nares patent, mucosa pink and non-inflamed w/o drainage. No sinus tenderness.  THROAT: Uvula midline. Oropharynx clear. Tonsils non-inflamed without exudate. Mucous membranes pink and moist.  RESPIRATORY: Chest wall symmetrical. Respirations even and non-labored. Breath sounds clear to auscultation bilaterally.  CARDIAC: S1, S2 present, regular rate and rhythm without murmur or gallops. Peripheral pulses 2+ bilaterally.  MSK: Muscle tone and strength appropriate for age. Joints w/o tenderness, redness, or swelling.  EXTREMITIES: Without clubbing, cyanosis, or edema.  NEUROLOGIC: No motor or sensory deficits. Steady, even gait. C2-C12 intact.  PSYCH/MENTAL STATUS: Alert, oriented x 3. Cooperative, appropriate mood and affect.    No results found for any visits on 04/09/24.    Assessment & Plan:  ***  No orders of the defined types were placed in this encounter.  Lab Orders  No laboratory test(s) ordered today   No images are attached to the encounter or orders placed in the encounter.  No follow-ups on file.    Patient to reach out to office if new, worrisome, or unresolved symptoms arise or if no improvement in patient's condition. Patient verbalized understanding and is agreeable to treatment plan. All questions answered to patient's satisfaction.    Thersia Schuyler Stark, [place]

## 2024-04-10 DIAGNOSIS — M778 Other enthesopathies, not elsewhere classified: Secondary | ICD-10-CM | POA: Insufficient documentation

## 2024-04-12 LAB — T. CRUZI AB, IGG PANEL
T. cruzi Antibody IgG: 0.5 IV (ref <=1.0)
T. cruzi, Recombinant Ag.: 0 {s_co_ratio} (ref <=0.8)

## 2024-04-14 ENCOUNTER — Ambulatory Visit (HOSPITAL_BASED_OUTPATIENT_CLINIC_OR_DEPARTMENT_OTHER): Payer: Self-pay | Admitting: Family Medicine

## 2024-04-14 NOTE — Progress Notes (Signed)
 Hi Shamon, Your lab is negative for signs of antibodies indicating current or past infection of Chagas. This rules out infection to the disease. It was likely a viral illness or reaction to the insect that occurred causing your symptoms. No further testing is needed at this time.

## 2024-04-29 ENCOUNTER — Telehealth: Payer: Self-pay | Admitting: Orthopedic Surgery

## 2024-04-29 NOTE — Telephone Encounter (Signed)
 I sent Neuro a message via teams for them to call this pt ASAP and get him scheduled

## 2024-04-29 NOTE — Telephone Encounter (Signed)
 Pt called because a Physical therapist that he Dr. Erwin referred him to has not contacted him.

## 2024-04-29 NOTE — Telephone Encounter (Signed)
 Pt called saying that the Medicaid PT didn't every call him and that Dr. Erwin will be receiving a referral for left wrist for inflammation from [facility] Pt call back number is [phone].

## 2024-04-29 NOTE — Telephone Encounter (Signed)
 Spoke with pt and Neuro and they scheduled for 10/9.

## 2024-04-30 ENCOUNTER — Ambulatory Visit (INDEPENDENT_AMBULATORY_CARE_PROVIDER_SITE_OTHER): Payer: MEDICAID | Admitting: *Deleted

## 2024-04-30 DIAGNOSIS — Z23 Encounter for immunization: Secondary | ICD-10-CM | POA: Diagnosis not present

## 2024-04-30 NOTE — Therapy (Unsigned)
 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   Manic affective disorder with recurrent episode (HCC)    Seasonal allergies    sore throat 07/29/2013   Skin problem 12/07/2021   Past Surgical History:  Procedure Laterality Date   CYST EXCISION PERINEAL N/A 07/12/2023   Procedure: SCROTUM EXPLORATION-excision of penile sebaceous cyst;  Surgeon: Sherrilee Belvie CROME, MD;  Location: [facility];  Service: Urology;  Laterality: N/A;   FOOT FOREIGN BODY REMOVAL Right 01/19/2011   KNEE ARTHROSCOPY Right 08/01/2013   Procedure: ARTHROSCOPY RIGHT KNEE, Chondroplasty, Excision of Plica;  Surgeon: Norleen CROME Gavel, MD;  Location: [facility];  Service: Orthopedics;  Laterality: Right;   REPAIR EXTENSOR TENDON Right 02/15/2024   Procedure: REPAIR, TENDON, EXTENSOR;  Surgeon: Arlinda Buster, MD;  Location: [facility];  Service: Orthopedics;  Laterality: Right;  RIGHT WRIST EXTENSOR CARPI ULNARIS SUBSHEATH RECONSTRUCTION   SCROTAL EXPLORATION N/A 11/09/2022   Procedure: SCROTUM EXPLORATION-excision of scrotal sebaceous cyst;  Surgeon: Sherrilee Belvie CROME, MD;  Location: [facility];  Service: Urology;  Laterality: N/A;   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 02/15/2024   Penile cyst 07/12/2023   Class 1 obesity due to excess calories without serious comorbidity with body mass index (BMI) of 31.0 to 31.9 in adult 02/28/2023    Scrotal sebaceous cyst 11/09/2022   Tendinitis of flexor tendon of both hands 07/19/2022   Hemorrhoids 02/25/2021   Chronic pain of right knee 01/11/2021   Bipolar 1 disorder (HCC) 10/18/2018   Tobacco use disorder 08/20/2017   GAD (generalized anxiety disorder) 09/25/2016    ONSET DATE: 04/07/2024 referral date 02/15/24 surgery date   REFERRING DIAG: S63.091A (ICD-10-CM) - Subluxation of extensor carpi ulnaris tendon, right, initial encounter  THERAPY DIAG:  Wrist tendonitis  Pain in right wrist  Stiffness of right wrist, not elsewhere classified  Other symptoms and signs involving the musculoskeletal system  Muscle weakness (generalized)  Rationale for Evaluation and Treatment: Rehabilitation  SUBJECTIVE:   SUBJECTIVE STATEMENT: I'm hanging in there as best as I can Pt accompanied by: self  PERTINENT HISTORY: S/p 02/15/24 R wrist ECU reconstruction. Had eval on 02/28/24 with [facility] who fabricated R Muenster orthosis to immobilize wrist and forearm.  (REFER TO [place]  HAND PROTOCOL VOLUME I PGS. [id number])   PRECAUTIONS: None  RED FLAGS: None   WEIGHT BEARING RESTRICTIONS: No  PAIN:  Are you having pain? Yes: NPRS scale: 0.2-0.3/10 Pain location: hand Pain description: not too bad Aggravating factors: moving wrist Relieving factors: Muenster splint, rest,   FALLS: Has patient fallen in last 6 months? 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 Erwin  OBJECTIVE:  Note: Objective measures were completed at Evaluation unless otherwise noted.  HAND DOMINANCE: Right  ADLs: WFL  FUNCTIONAL OUTCOME MEASURES: Upper Extremity Functional Scale  (UEFS): 54/80, currently functioning at 67.5% of full potential  UPPER EXTREMITY ROM:     Active ROM Right eval Left eval  Shoulder flexion    Shoulder abduction    Shoulder adduction    Shoulder extension    Shoulder internal rotation    Shoulder external rotation    Elbow flexion    Elbow extension    Wrist flexion  10  Wrist extension  15  Wrist ulnar deviation  5  Wrist radial deviation  10  Wrist pronation    Wrist supination    (Blank rows = not tested)  Active ROM Right eval Left eval  Thumb MCP (0-60)    Thumb IP (0-80)    Thumb Radial abd/add (0-55)     Thumb Palmar abd/add (0-45)     Thumb Opposition to Small Finger     Index MCP (0-90)     Index PIP (0-100)     Index DIP (0-70)      Long MCP (0-90)      Long PIP (0-100)      Long DIP (0-70)      Ring MCP (0-90)      Ring PIP (0-100)      Ring DIP (0-70)      Little MCP (0-90)      Little PIP (0-100)      Little DIP (0-70)      (Blank rows = not tested)   UPPER EXTREMITY MMT:     MMT Right eval Left eval  Shoulder flexion    Shoulder abduction    Shoulder adduction    Shoulder extension    Shoulder internal rotation    Shoulder external rotation    Middle trapezius    Lower trapezius    Elbow flexion    Elbow extension    Wrist flexion    Wrist extension    Wrist ulnar deviation    Wrist radial deviation    Wrist pronation    Wrist supination    (Blank rows = not tested)  HAND FUNCTION: Grip strength: Right: 33 lbs; Left: 100 lbs  COORDINATION: 9 Hole Peg test: Right: 28.25 sec; Left: 26.41 sec  SENSATION: Some numbness and tingling in R wrist   EDEMA: none  COGNITION: Overall cognitive status: Within functional limits for tasks assessed Areas of impairment: none  OBSERVATIONS: limited ROM in wrist, impaired grip strength, decrease in overall function   TREATMENT DATE: 05/01/24                                                                                                                            Educated pt in purpose of OT and reviewed goals and POC with pt in agreement. Educated pt in gentle wrist PROM HEP as well as scar  massage, see pt instructions for detailed handouts. Educated pt in slowly weaning off of Muenster orthosis but to continue wear for activities involving weighted resistance >5 lbs, repetitive motion, compression, and/or distraction of wrist per [place]  Hand protocol, would benefit from very soon discontinuing wear of orthosis and eventual participation in hand strengthening. Also educated pt in benefits of heat modality to reduce stiffness and any pain in R wrist and to not apply for any longer than 30 minutes at a time.     PATIENT EDUCATION: Education details: scar massage, purpose of OT, wrist PROM HEP Person educated: Patient Education method: Explanation, Demonstration, and Handouts Education comprehension: verbalized understanding and returned demonstration  HOME EXERCISE PROGRAM: 05/01/24: scar massage, wrist PROM (ACCESS CODE: [id number])    GOALS: Goals reviewed with patient? Yes  SHORT TERM GOALS: Target date: 05/29/24  Pt will be independent with scar massage and prescribed HEP for ROM and strengthening of R hand Baseline: Initiated this date Goal status: INITIAL  2.  Pt will only wear Muenster orthosis for comfort and activities involving >5 lbs resistance Baseline: Pt new to OP OT, wears constantly especially for driving  Goal status: INITIAL  3.  Pt will increase grip strength by at least 10 lbs in R hand Baseline: 33 lbs Goal status: INITIAL  4.  Pt will increase R wrist flexion and extension AROM by at least 15 degrees Baseline: Flexion 10 degrees, 15 degrees extension Goal status: INITIAL    LONG TERM GOALS: Target date: 06/12/24  Pt will increase wrist flexion AROM by at least 50 degrees Baseline: 10 degrees  Goal status: INITIAL  2.  Pt will increase wrist extension AROM by at least 45  degrees Baseline: 15 degrees  Goal status: INITIAL  3.  Pt will increase ulnar deviation ROM of R wrist by at least 20 degrees Baseline: 5 degrees  Goal status: INITIAL  4.  Pt will increase UEFS index score to at least 70/80, indicating increase of function by 20% Baseline: 54/80  Goal status: INITIAL  5.  Pt will increase R grip strength by at least 40 pounds  Baseline: 33 lbs Goal status: INITIAL    ASSESSMENT:  CLINICAL IMPRESSION: Patient is a 33 y.o. male who was seen today for occupational therapy evaluation for 02/15/24 R wrist ECU reconstruction d/t subluxation. Hx includes ECU tendon sheath reconstruction, bipolar disorder I, anxiety, B wrist tendonitis. Patient currently presents below baseline level of functioning demonstrating functional deficits and impairments as noted below. Pt would benefit from skilled OT services in the outpatient setting to work on impairments as noted below to help pt return to PLOF as able.     PERFORMANCE DEFICITS: in functional skills including ADLs, IADLs, ROM, strength, pain, skin integrity, and UE functional use.  IMPAIRMENTS: are limiting patient from ADLs, IADLs, and work.   COMORBIDITIES: may have co-morbidities  that affects occupational performance. Patient will benefit from skilled OT to address above impairments and improve overall function.  MODIFICATION OR ASSISTANCE TO COMPLETE EVALUATION: Min-Moderate modification of tasks or assist with assess necessary to complete an evaluation.  OT OCCUPATIONAL PROFILE AND HISTORY: Detailed assessment: Review of records and additional review of physical, cognitive, psychosocial history related to current functional performance.  CLINICAL DECISION MAKING: Moderate - several treatment options, min-mod task modification necessary  REHAB POTENTIAL: Good  EVALUATION COMPLEXITY: Moderate      PLAN:  OT FREQUENCY: 2x/week  OT DURATION: 6 weeks  PLANNED INTERVENTIONS: 02831 OT  Re-evaluation, 97535 self care/ADL training,  97110 therapeutic exercise, 97530 therapeutic activity, 97035 ultrasound, 02981 paraffin, 97039 fluidotherapy, 97010 moist heat, 97010 cryotherapy, 97034 contrast bath, 97760 Orthotic Initial, 97763 Orthotic/Prosthetic subsequent, scar mobilization, passive range of motion, coping strategies training, patient/family education, and DME and/or AE instructions  RECOMMENDED OTHER SERVICES: none at this time  CONSULTED AND AGREED WITH PLAN OF CARE: Patient  PLAN FOR NEXT SESSION: fluidotherapy or ultrasound F/u on scar massage and wrist PROM HEP Activities to promote wrist ROM  F/u on s/p 10/13 visit with Dr. Erwin Discontinuation of orthosis wear except for comfort?     Rocky Dutch, OT 05/01/2024, 10:55 AM

## 2024-04-30 NOTE — Progress Notes (Signed)
 Patient is in office today for a nurse visit for 2nd HPV Immunization. Patient Injection was given in the  Left deltoid. Patient tolerated injection well.

## 2024-05-01 ENCOUNTER — Ambulatory Visit: Payer: MEDICAID | Attending: Orthopedic Surgery

## 2024-05-01 DIAGNOSIS — M778 Other enthesopathies, not elsewhere classified: Secondary | ICD-10-CM | POA: Insufficient documentation

## 2024-05-01 DIAGNOSIS — R29898 Other symptoms and signs involving the musculoskeletal system: Secondary | ICD-10-CM | POA: Insufficient documentation

## 2024-05-01 DIAGNOSIS — M25531 Pain in right wrist: Secondary | ICD-10-CM | POA: Diagnosis present

## 2024-05-01 DIAGNOSIS — M25631 Stiffness of right wrist, not elsewhere classified: Secondary | ICD-10-CM | POA: Insufficient documentation

## 2024-05-01 DIAGNOSIS — R278 Other lack of coordination: Secondary | ICD-10-CM | POA: Diagnosis present

## 2024-05-01 DIAGNOSIS — S63091A Other subluxation of right wrist and hand, initial encounter: Secondary | ICD-10-CM | POA: Insufficient documentation

## 2024-05-01 DIAGNOSIS — M6281 Muscle weakness (generalized): Secondary | ICD-10-CM | POA: Insufficient documentation

## 2024-05-01 DIAGNOSIS — R208 Other disturbances of skin sensation: Secondary | ICD-10-CM | POA: Insufficient documentation

## 2024-05-01 NOTE — Addendum Note (Signed)
 Addended by: Ashwath Lasch on: 05/01/2024 11:00 AM   Modules accepted: Orders

## 2024-05-01 NOTE — Patient Instructions (Addendum)
 Clinton Fisher

## 2024-05-05 ENCOUNTER — Ambulatory Visit: Payer: MEDICAID

## 2024-05-05 ENCOUNTER — Ambulatory Visit: Payer: MEDICAID | Admitting: Orthopedic Surgery

## 2024-05-05 DIAGNOSIS — M25631 Stiffness of right wrist, not elsewhere classified: Secondary | ICD-10-CM

## 2024-05-05 DIAGNOSIS — S63091A Other subluxation of right wrist and hand, initial encounter: Secondary | ICD-10-CM

## 2024-05-05 DIAGNOSIS — R29898 Other symptoms and signs involving the musculoskeletal system: Secondary | ICD-10-CM

## 2024-05-05 DIAGNOSIS — M6281 Muscle weakness (generalized): Secondary | ICD-10-CM

## 2024-05-05 DIAGNOSIS — M25531 Pain in right wrist: Secondary | ICD-10-CM

## 2024-05-05 DIAGNOSIS — M778 Other enthesopathies, not elsewhere classified: Secondary | ICD-10-CM | POA: Diagnosis not present

## 2024-05-05 DIAGNOSIS — R278 Other lack of coordination: Secondary | ICD-10-CM

## 2024-05-05 NOTE — Progress Notes (Unsigned)
   MANINDER DEBOER - 33 y.o. male MRN [id number]  Date of birth: 03/13/1991  Office Visit Note: Visit Date: 05/05/2024 PCP: Knute Thersia Bitters, FNP Referred by: Knute Thersia Bitters, *  Subjective:  HPI: BYRD RUSHLOW is a 33 y.o. male who presents today for follow up 11 weeks status post Right wrist extensor carpi ulnar subsheath reconstruction .  Pertinent ROS were reviewed with the patient and found to be negative unless otherwise specified above in HPI.   Assessment & Plan: Visit Diagnoses: No diagnosis found.  Plan: ***  Follow-up: No follow-ups on file.   Meds & Orders: No orders of the defined types were placed in this encounter.  No orders of the defined types were placed in this encounter.    Procedures: No procedures performed       Objective:   Vital Signs: There were no vitals taken for this visit.  Ortho Exam ***  Imaging: No results found.   Anshul Afton Alderton, M.D.  [facility], Hand [facility]

## 2024-05-05 NOTE — Therapy (Signed)
 OUTPATIENT OCCUPATIONAL THERAPY ORTHO TREATMENT  Patient Name: Clinton Fisher MRN: [id number] DOB:1991-04-22, 33 y.o., male Today's Date: 05/05/2024  PCP: Knute Thersia Bitters, NP REFERRING PROVIDER: Arlinda Buster, MD  END OF SESSION:  OT End of Session - 05/05/24 0935     Visit Number 2    Number of Visits 12    Date for Recertification  06/12/24    Authorization Type [employer]    Authorization - Visit Number 12    OT Start Time 0932    OT Stop Time 1025    OT Time Calculation (min) 53 min    Equipment Utilized During Treatment red theraputty, nuts and bolts, 1# and 2# weight, arm bolster    Activity Tolerance Patient tolerated treatment well    Behavior During Therapy WFL for tasks assessed/performed          Past Medical History:  Diagnosis Date   Abrasion of index finger 07/29/2013   left   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   Manic affective disorder with recurrent episode (HCC)    Seasonal allergies    sore throat 07/29/2013   Skin problem 12/07/2021   Past Surgical History:  Procedure Laterality Date   CYST EXCISION PERINEAL N/A 07/12/2023   Procedure: SCROTUM EXPLORATION-excision of penile sebaceous cyst;  Surgeon: Sherrilee Belvie CROME, MD;  Location: [facility];  Service: Urology;  Laterality: N/A;   FOOT FOREIGN BODY REMOVAL Right 01/19/2011   KNEE ARTHROSCOPY Right 08/01/2013   Procedure: ARTHROSCOPY RIGHT KNEE, Chondroplasty, Excision of Plica;  Surgeon: Norleen CROME Gavel, MD;  Location: [facility];  Service: Orthopedics;  Laterality: Right;   REPAIR EXTENSOR TENDON Right 02/15/2024   Procedure: REPAIR, TENDON, EXTENSOR;  Surgeon: Arlinda Buster, MD;  Location: [facility];  Service: Orthopedics;  Laterality: Right;  RIGHT WRIST EXTENSOR CARPI ULNARIS SUBSHEATH RECONSTRUCTION   SCROTAL EXPLORATION N/A 11/09/2022   Procedure: SCROTUM  EXPLORATION-excision of scrotal sebaceous cyst;  Surgeon: Sherrilee Belvie CROME, MD;  Location: [facility];  Service: Urology;  Laterality: N/A;   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 02/15/2024   Penile cyst 07/12/2023   Class 1 obesity due to excess calories without serious comorbidity with body mass index (BMI) of 31.0 to 31.9 in adult 02/28/2023   Scrotal sebaceous cyst 11/09/2022   Tendinitis of flexor tendon of both hands 07/19/2022   Hemorrhoids 02/25/2021   Chronic pain of right knee 01/11/2021   Bipolar 1 disorder (HCC) 10/18/2018   Tobacco use disorder 08/20/2017   GAD (generalized anxiety disorder) 09/25/2016    ONSET DATE: 04/07/2024 referral date 02/15/24 surgery date   REFERRING DIAG: S63.091A (ICD-10-CM) - Subluxation of extensor carpi ulnaris tendon, right, initial encounter  THERAPY DIAG:  Other lack of coordination  Muscle weakness (generalized)  Other symptoms and signs involving the musculoskeletal system  Pain in right wrist  Stiffness of right wrist, not elsewhere classified  Rationale for Evaluation and Treatment: Rehabilitation  SUBJECTIVE:   SUBJECTIVE STATEMENT: I'm doing pretty good, I'm only wearing the brace at night for sleep or when Im doing heavy lifting. Pt accompanied by: self  PERTINENT HISTORY: S/p 02/15/24 R wrist ECU reconstruction. Had eval on 02/28/24 with [facility] who fabricated R Mu[place] orthosis to immobilize  wrist and forearm.  (REFER TO [place]  HAND PROTOCOL VOLUME I PGS. 182-183)   PRECAUTIONS: None  RED FLAGS: None   WEIGHT BEARING RESTRICTIONS: No  PAIN:  Are you having pain? Yes: NPRS scale: 0.2-0.3/10 Pain location: hand Pain description: not too bad Aggravating factors: moving wrist Relieving factors: Muenster splint, rest,   FALLS: Has patient fallen in last 6 months?  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 Erwin  OBJECTIVE:  Note: Objective measures were completed at Evaluation unless otherwise noted.  HAND DOMINANCE: Right  ADLs: WFL  FUNCTIONAL OUTCOME MEASURES: Upper Extremity Functional Scale (UEFS): 54/80, currently functioning at 67.5% of full potential  UPPER EXTREMITY ROM:     Active ROM Right eval Left eval  Shoulder flexion    Shoulder abduction    Shoulder adduction    Shoulder extension    Shoulder internal rotation    Shoulder external rotation    Elbow flexion    Elbow extension    Wrist flexion  10  Wrist extension  15  Wrist ulnar deviation  5  Wrist radial deviation  10  Wrist pronation    Wrist supination    (Blank rows = not tested)  Active ROM Right eval Left eval  Thumb MCP (0-60)    Thumb IP (0-80)    Thumb Radial abd/add (0-55)     Thumb Palmar abd/add (0-45)     Thumb Opposition to Small Finger     Index MCP (0-90)     Index PIP (0-100)     Index DIP (0-70)      Long MCP (0-90)      Long PIP (0-100)      Long DIP (0-70)      Ring MCP (0-90)      Ring PIP (0-100)      Ring DIP (0-70)      Little MCP (0-90)      Little PIP (0-100)      Little DIP (0-70)      (Blank rows = not tested)   UPPER EXTREMITY MMT:     MMT Right eval Left eval  Shoulder flexion    Shoulder abduction    Shoulder adduction    Shoulder extension    Shoulder internal rotation    Shoulder external rotation    Middle trapezius    Lower trapezius    Elbow flexion    Elbow extension    Wrist flexion    Wrist extension    Wrist ulnar deviation    Wrist radial deviation    Wrist pronation     Wrist supination    (Blank rows = not tested)  HAND FUNCTION: Grip strength: Right: 33 lbs; Left: 100 lbs  COORDINATION: 9 Hole Peg test: Right: 28.25 sec; Left: 26.41 sec  SENSATION: Some numbness and tingling in R wrist   EDEMA: none  COGNITION: Overall cognitive status: Within functional limits for tasks assessed Areas of impairment: none  OBSERVATIONS: limited ROM in wrist, impaired grip strength, decrease in overall function   TREATMENT DATE: 05/05/24  Pt reports a small split in scar area where he massaged it, reports maybe I rubbed it a little too hard, is seeing Dr. Erwin today and agreed to bring this up to him. D/t hygiene concerns s/p small tear (wound was closed up) did Educated importance of letting up pressure to reduce risk of additional skin tear, pt reports weaning off of [place] orthosis, only wearing at night and lifting heavy objects. Pt educated in theraputty HEP to strengthen R hand for functional return. Pt educated in proper care of putty and verbalized understanding.   Completed strengthening of R wrist first with 1 lb, graded up to 2 pounds. Completed 2x10 wrist flexion (with forearm supinated) , wrist extension (with use of bolster). AROM 2x10 wrist radial/ulnar deviation. Educated pt in therapeutic activity to promote wrist radial/ulnar deviation attaching and detaching nuts and bolts.    PATIENT EDUCATION: Education details: Theraputty HEP and proper care of putty (ACCESS CODE [id number])  Person educated: Patient Education method: Explanation, Demonstration, and Handouts Education comprehension: verbalized understanding and returned demonstration  HOME EXERCISE PROGRAM: 05/01/24: scar massage, wrist PROM (ACCESS CODE: [id number]) 05/05/24: Theraputty HEP with red putty [facility])       GOALS: Goals reviewed with patient?  Yes  SHORT TERM GOALS: Target date: 05/29/24  Pt will be independent with scar massage and prescribed HEP for ROM and strengthening of R hand Baseline: Initiated this date Goal status: INITIAL  2.  Pt will only wear [place] orthosis for comfort and activities involving >5 lbs resistance Baseline: Pt new to OP OT, wears constantly especially for driving  Goal status: INITIAL  3.  Pt will increase grip strength by at least 10 lbs in R hand Baseline: 33 lbs Goal status: INITIAL  4.  Pt will increase R wrist flexion and extension AROM by at least 15 degrees Baseline: Flexion 10 degrees, 15 degrees extension Goal status: INITIAL    LONG TERM GOALS: Target date: 06/12/24  Pt will increase wrist flexion AROM by at least 50 degrees Baseline: 10 degrees  Goal status: INITIAL  2.  Pt will increase wrist extension AROM by at least 45 degrees Baseline: 15 degrees  Goal status: INITIAL  3.  Pt will increase ulnar deviation ROM of R wrist by at least 20 degrees Baseline: 5 degrees  Goal status: INITIAL  4.  Pt will increase UEFS index score to at least 70/80, indicating increase of function by 20% Baseline: 54/80  Goal status: INITIAL  5.  Pt will increase R grip strength by at least 40 pounds  Baseline: 33 lbs Goal status: INITIAL    ASSESSMENT:  CLINICAL IMPRESSION: Patient is a 33 y.o. male who was seen today for occupational therapy tx for 02/15/24 R wrist ECU reconstruction d/t subluxation. Hx includes ECU tendon sheath reconstruction, bipolar disorder I, anxiety, B wrist tendonitis. Patient currently presents below baseline level of functioning demonstrating functional deficits and impairments as noted below. Pt would benefit from skilled OT services in the outpatient setting to work on impairments as noted below to help pt return to PLOF as able.     PERFORMANCE DEFICITS: in functional skills including ADLs, IADLs, ROM, strength, pain, skin integrity, and UE functional  use.  IMPAIRMENTS: are limiting patient from ADLs, IADLs, and work.   COMORBIDITIES: may have co-morbidities  that affects occupational performance. Patient will benefit from skilled OT to address above impairments and improve overall function.  MODIFICATION OR ASSISTANCE TO COMPLETE EVALUATION: Min-Moderate modification of tasks or assist with assess  necessary to complete an evaluation.  OT OCCUPATIONAL PROFILE AND HISTORY: Detailed assessment: Review of records and additional review of physical, cognitive, psychosocial history related to current functional performance.  CLINICAL DECISION MAKING: Moderate - several treatment options, min-mod task modification necessary  REHAB POTENTIAL: Good  EVALUATION COMPLEXITY: Moderate      PLAN:  OT FREQUENCY: 2x/week  OT DURATION: 6 weeks  PLANNED INTERVENTIONS: 97168 OT Re-evaluation, 97535 self care/ADL training, 02889 therapeutic exercise, 97530 therapeutic activity, 97035 ultrasound, 97018 paraffin, 02960 fluidotherapy, 97010 moist heat, 97010 cryotherapy, 97034 contrast bath, 97760 Orthotic Initial, 97763 Orthotic/Prosthetic subsequent, scar mobilization, passive range of motion, coping strategies training, patient/family education, and DME and/or AE instructions  RECOMMENDED OTHER SERVICES: none at this time  CONSULTED AND AGREED WITH PLAN OF CARE: Patient  PLAN FOR NEXT SESSION: fluidotherapy or ultrasound  F/u putty Activities to promote wrist ROM  F/u on s/p 10/13 visit with Dr. Erwin Monitor scar tissue area  Rocky Dutch, OT 05/05/2024, 10:32 AM

## 2024-05-08 ENCOUNTER — Ambulatory Visit: Payer: MEDICAID

## 2024-05-13 ENCOUNTER — Ambulatory Visit: Payer: MEDICAID

## 2024-05-13 DIAGNOSIS — M778 Other enthesopathies, not elsewhere classified: Secondary | ICD-10-CM

## 2024-05-13 DIAGNOSIS — M6281 Muscle weakness (generalized): Secondary | ICD-10-CM

## 2024-05-13 DIAGNOSIS — M25631 Stiffness of right wrist, not elsewhere classified: Secondary | ICD-10-CM

## 2024-05-13 DIAGNOSIS — R278 Other lack of coordination: Secondary | ICD-10-CM

## 2024-05-13 DIAGNOSIS — M25531 Pain in right wrist: Secondary | ICD-10-CM

## 2024-05-13 NOTE — Therapy (Signed)
 OUTPATIENT OCCUPATIONAL THERAPY ORTHO TREATMENT  Patient Name: Clinton Fisher MRN: [id number] DOB:Mar 05, 1991, 33 y.o., male Today's Date: 05/13/2024  PCP: Knute Thersia Bitters, NP REFERRING PROVIDER: Arlinda Buster, MD  END OF SESSION:  OT End of Session - 05/13/24 1404     Visit Number 3    Number of Visits 12    Date for Recertification  06/12/24    Authorization Type [employer]    Authorization - Visit Number 3    Authorization - Number of Visits 12    OT Start Time 1402    OT Stop Time 1447    OT Time Calculation (min) 45 min          Past Medical History:  Diagnosis Date   Abrasion of index finger 07/29/2013   left   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   Manic affective disorder with recurrent episode (HCC)    Seasonal allergies    sore throat 07/29/2013   Skin problem 12/07/2021   Past Surgical History:  Procedure Laterality Date   CYST EXCISION PERINEAL N/A 07/12/2023   Procedure: SCROTUM EXPLORATION-excision of penile sebaceous cyst;  Surgeon: Sherrilee Belvie CROME, MD;  Location: [facility];  Service: Urology;  Laterality: N/A;   FOOT FOREIGN BODY REMOVAL Right 01/19/2011   KNEE ARTHROSCOPY Right 08/01/2013   Procedure: ARTHROSCOPY RIGHT KNEE, Chondroplasty, Excision of Plica;  Surgeon: Norleen CROME Gavel, MD;  Location: [facility];  Service: Orthopedics;  Laterality: Right;   REPAIR EXTENSOR TENDON Right 02/15/2024   Procedure: REPAIR, TENDON, EXTENSOR;  Surgeon: Arlinda Buster, MD;  Location: [facility];  Service: Orthopedics;  Laterality: Right;  RIGHT WRIST EXTENSOR CARPI ULNARIS SUBSHEATH RECONSTRUCTION   SCROTAL EXPLORATION N/A 11/09/2022   Procedure: SCROTUM EXPLORATION-excision of scrotal sebaceous cyst;  Surgeon: Sherrilee Belvie CROME, MD;  Location: [facility];  Service: Urology;  Laterality: N/A;   Patient Active Problem List    Diagnosis Date Noted   Tendonitis of wrist, left 04/10/2024   Soft tissue infection 03/17/2024   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 02/15/2024   Penile cyst 07/12/2023   Class 1 obesity due to excess calories without serious comorbidity with body mass index (BMI) of 31.0 to 31.9 in adult 02/28/2023   Scrotal sebaceous cyst 11/09/2022   Tendinitis of flexor tendon of both hands 07/19/2022   Hemorrhoids 02/25/2021   Chronic pain of right knee 01/11/2021   Bipolar 1 disorder (HCC) 10/18/2018   Tobacco use disorder 08/20/2017   GAD (generalized anxiety disorder) 09/25/2016    ONSET DATE: 04/07/2024 referral date 02/15/24 surgery date   REFERRING DIAG: S63.091A (ICD-10-CM) - Subluxation of extensor carpi ulnaris tendon, right, initial encounter  THERAPY DIAG:  Other lack of coordination  Muscle weakness (generalized)  Stiffness of right wrist, not elsewhere classified  Wrist tendonitis  Pain in right wrist  Rationale for Evaluation and Treatment: Rehabilitation  SUBJECTIVE:   SUBJECTIVE STATEMENT: Pt reports good f/u with Dr. Erwin. Also reports using R hand for more functional tasks but remains cautious, stating I make sure I feel like I can do it, if it's too heavy I don't use that hand.  Pt accompanied by: self  PERTINENT HISTORY: S/p 02/15/24 R wrist ECU reconstruction. Had eval on 02/28/24 with [facility] who fabricated R [place] orthosis to immobilize wrist and forearm.  (REFER TO [place]  HAND PROTOCOL VOLUME I PGS. 182-183)  PRECAUTIONS: None  RED FLAGS: None   WEIGHT BEARING RESTRICTIONS: No  PAIN:  Are you having pain? No  FALLS: Has patient fallen in last 6 months? 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  HAND DOMINANCE: Right  ADLs: WFL  FUNCTIONAL OUTCOME MEASURES: Upper Extremity Functional Scale (UEFS): 54/80, currently functioning at 67.5% of full potential  UPPER EXTREMITY ROM:     Active ROM Right eval Left eval  Shoulder flexion    Shoulder abduction    Shoulder adduction    Shoulder extension    Shoulder internal rotation    Shoulder external rotation    Elbow flexion    Elbow extension    Wrist flexion  10  Wrist extension  15  Wrist ulnar deviation  5  Wrist radial deviation  10  Wrist pronation    Wrist supination    (Blank rows = not tested)  Active ROM Right eval Left eval  Thumb MCP (0-60)    Thumb IP (0-80)    Thumb Radial abd/add (0-55)     Thumb Palmar abd/add (0-45)     Thumb Opposition to Small Finger     Index MCP (0-90)     Index PIP (0-100)     Index DIP (0-70)      Long MCP (0-90)      Long PIP (0-100)      Long DIP (0-70)      Ring MCP (0-90)      Ring PIP (0-100)      Ring DIP (0-70)      Little MCP (0-90)      Little PIP (0-100)      Little DIP (0-70)      (Blank rows = not tested)   UPPER EXTREMITY MMT:     MMT Right eval Left eval  Shoulder flexion    Shoulder abduction    Shoulder adduction    Shoulder extension    Shoulder internal rotation    Shoulder external rotation    Middle trapezius    Lower trapezius    Elbow flexion    Elbow extension    Wrist flexion    Wrist extension    Wrist ulnar deviation    Wrist radial deviation    Wrist pronation    Wrist supination    (Blank rows = not tested)  HAND FUNCTION: Grip strength: Right: 33 lbs; Left: 100 lbs  COORDINATION: 9 Hole Peg test: Right: 28.25 sec; Left: 26.41 sec  SENSATION: Some numbness and tingling in R wrist   EDEMA: none  COGNITION: Overall cognitive status: Within  functional limits for tasks assessed Areas of impairment: none  OBSERVATIONS: limited ROM in wrist, impaired grip strength, decrease in overall function   TREATMENT DATE: 05/13/24  Pt reports focusing mainly on strength at home, educated pt in importance of continuing ROM and stretching as well as scar tissue massage (re-iterating to not apply too much pressure during massage or stretch), pt agreed and stated he would pay more attention to this. Re-assessed ROM and grip strength, see goals for updated info. Reviewed HEP and added stretch for radial and ulnar deviation, pt tolerated well with no concerns.  Applied ultrasound to reduce pain and promote scar tissue massage and mobility, 3.3 MHz for 8 minutes at 20% duty cycle, 1.0 w/cm2 intensity. Pt tolerated well and stated that it felt good, that area feels a little looser now I think I can bend it more.   Pt reports some concern with weakened R shoulder and elbow s/p surgery d/t not using that arm as much during recovery. Issued isometric HEP targeting shoulder, see Pt instructions for details.   PATIENT EDUCATION: Education details: SEE ABOVE Person educated: Patient Education method: Explanation, Demonstration, and Handouts Education comprehension: verbalized understanding and returned demonstration  HOME EXERCISE PROGRAM: 05/01/24: scar massage, wrist PROM (ACCESS CODE: [id number]) 05/05/24: Theraputty HEP with red putty [facility])  05/13/24: Shoulder isometrics (per pt request): (ACCESS CODE: [id number])    GOALS: Goals reviewed with patient? Yes  SHORT TERM GOALS: Target date: 05/29/24  Pt will be independent with scar massage and prescribed HEP for ROM and strengthening of R hand Baseline: Initiated this date Goal status: IN PROGRESS  2.  Pt will only wear [place] orthosis for comfort and activities  involving >5 lbs resistance Baseline: Pt new to OP OT, wears constantly especially for driving  Goal status: GOAL MET  3.  Pt will increase grip strength by at least 10 lbs in R hand Baseline: 33 lbs 05/13/24: 65.0 lbs Goal status: GOAL MET  4.  Pt will increase R wrist flexion and extension AROM by at least 15 degrees Baseline: Flexion 10 degrees, 15 degrees extension 05/13/24: 45 degrees flexion, 19 degrees extension  Goal status: INITIAL    LONG TERM GOALS: Target date: 06/12/24  Pt will increase wrist flexion AROM by at least 50 degrees Baseline: 10 degrees  05/13/24: 45 degrees Goal status: IN PROGRESS  2.  Pt will increase wrist extension AROM by at least 45 degrees Baseline: 15 degrees  05/13/24: 19 degrees Goal status: IN PROGRESS  3.  Pt will increase ulnar deviation ROM of R wrist by at least 20 degrees Baseline: 5 degrees  05/13/24: 9 degrees Goal status: INITIAL  4.  Pt will increase UEFS index score to at least 70/80, indicating increase of function by 20% Baseline: 54/80  Goal status: INITIAL  5.  Pt will increase R grip strength by at least 40 pounds  Baseline: 33 lbs 05/13/24: 65.0 lbs Goal status: IN PROGRESS    ASSESSMENT:  CLINICAL IMPRESSION: Patient is a 33 y.o. male who was seen today for occupational therapy tx for 02/15/24 R wrist ECU reconstruction d/t subluxation. Hx includes ECU tendon sheath reconstruction, bipolar disorder I, anxiety, B wrist tendonitis. Pt participating well with HEP, does require some cueing to not apply too much pressure during scar tissue massage. Demonsrates good understanding of isometrics and tolerated ultrasound well. Did have to re-iterate importance of ROM and massages in addition to strengthening, but pt verbalized understanding and agreed to work on these more. Noted improvement in grip strength and improvements in ROM, see goals for updated measurements. Pt would benefit from continued skilled OT services to  improve ROM and strength further.  PERFORMANCE DEFICITS: in functional skills including ADLs, IADLs, ROM, strength, pain, skin integrity, and UE functional use.  IMPAIRMENTS: are limiting patient from ADLs, IADLs, and work.   COMORBIDITIES: may have co-morbidities  that affects occupational performance. Patient will benefit from skilled OT to address above impairments and improve overall function.  MODIFICATION OR ASSISTANCE TO COMPLETE EVALUATION: Min-Moderate modification of tasks or assist with assess necessary to complete an evaluation.  OT OCCUPATIONAL PROFILE AND HISTORY: Detailed assessment: Review of records and additional review of physical, cognitive, psychosocial history related to current functional performance.  CLINICAL DECISION MAKING: Moderate - several treatment options, min-mod task modification necessary  REHAB POTENTIAL: Good  EVALUATION COMPLEXITY: Moderate      PLAN:  OT FREQUENCY: 2x/week  OT DURATION: 6 weeks  PLANNED INTERVENTIONS: 97168 OT Re-evaluation, 97535 self care/ADL training, 02889 therapeutic exercise, 97530 therapeutic activity, 97035 ultrasound, 97018 paraffin, 02960 fluidotherapy, 97010 moist heat, 97010 cryotherapy, 97034 contrast bath, 97760 Orthotic Initial, 97763 Orthotic/Prosthetic subsequent, scar mobilization, passive range of motion, coping strategies training, patient/family education, and DME and/or AE instructions  RECOMMENDED OTHER SERVICES: none at this time  CONSULTED AND AGREED WITH PLAN OF CARE: Patient  PLAN FOR NEXT SESSION: Fluidotherapy Wrist ROM/stretches and strengthening F/u isometrics for shoulder, add on elbow isometrics prn  Rocky Dutch, OT 05/13/2024, 3:45 PM

## 2024-05-14 ENCOUNTER — Ambulatory Visit (INDEPENDENT_AMBULATORY_CARE_PROVIDER_SITE_OTHER): Payer: MEDICAID | Admitting: Family Medicine

## 2024-05-14 ENCOUNTER — Encounter (HOSPITAL_BASED_OUTPATIENT_CLINIC_OR_DEPARTMENT_OTHER): Payer: Self-pay | Admitting: Family Medicine

## 2024-05-14 VITALS — BP 122/83 | HR 93 | Ht 69.0 in | Wt 213.9 lb

## 2024-05-14 DIAGNOSIS — N4889 Other specified disorders of penis: Secondary | ICD-10-CM | POA: Diagnosis not present

## 2024-05-14 DIAGNOSIS — T50Z95A Adverse effect of other vaccines and biological substances, initial encounter: Secondary | ICD-10-CM | POA: Diagnosis not present

## 2024-05-14 DIAGNOSIS — S46911A Strain of unspecified muscle, fascia and tendon at shoulder and upper arm level, right arm, initial encounter: Secondary | ICD-10-CM | POA: Diagnosis not present

## 2024-05-14 DIAGNOSIS — M778 Other enthesopathies, not elsewhere classified: Secondary | ICD-10-CM

## 2024-05-14 MED ORDER — DOXYCYCLINE HYCLATE 100 MG PO TABS: 100.0000 mg | ORAL_TABLET | Freq: Two times a day (BID) | ORAL | 0 refills | Status: DC

## 2024-05-14 NOTE — Progress Notes (Addendum)
 Subjective:   Clinton Fisher 04-22-1991 05/14/2024  Chief Complaint  Patient presents with   Wrist Pain    Pt has been having pain in left wrist since last OV and states after taking meloxicam , pt is still complaining of pain. Pt wants to know if a referral might need to be placed to have wrist looked at.    Discussed the use of AI scribe software for clinical note transcription with the patient, who gave verbal consent to proceed.  History of Present Illness Clinton Fisher is a 33 year old male who presents with concerns about a potentially infected injection site, wrist pain, and a new cyst.  IM SITE REACTION:  He is concerned about a potentially infected injection site following an HPV vaccination. The site has turned a shade of green, which is identified by PCP as a bruise.This is the first time he has received this shot, and he describes the sensation as if it 'went to the bone'.  LEFT WRIST PAIN:  He has ongoing issues with his left wrist, describing it as still inflamed. He has been undergoing physical therapy for his right wrist and has been doing a lot of torquing movements. He forgot to take his medication, Meloxicam , for a couple of days but has been taking it intermittently. The pain comes and goes, feeling better for half the day before acting up again. He recalls having extensor tendinitis to right wrist in the past requiring surgical intervention with Dr. Arlinda.    PENILE CYST:  He mentions a new cyst that appeared under the scar of a previous surgical site. The cyst was initially inflamed and eventually popped. He has been applying Neosporin and keeping the area clean. He also started taking leftover doxycycline  from a previous dermatologist visit. The cyst is 'dying down'.  Patient previously had a penile cyst that was incised and drained by urology in December 2024 with follow-up and resolution in January 2025.  He denies any urinary symptoms at this  time  RIGHT SHOULDER PAIN:  He discusses his right shoulder, concerned it might be a pulled muscle or dislocation. He has resumed physical therapy and notes that his shoulder is 'getting back to speed pretty quickly'. He describes some pain and tightness in the upper muscles of his left arm. He has been given exercises and stretches to perform by PT.   The following portions of the patient's history were reviewed and updated as appropriate: past medical history, past surgical history, family history, social history, allergies, medications, and problem list.   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 02/15/2024   Penile cyst 07/12/2023   Class 1 obesity due to excess calories without serious comorbidity with body mass index (BMI) of 31.0 to 31.9 in adult 02/28/2023   Scrotal sebaceous cyst 11/09/2022   Tendinitis of flexor tendon of both hands 07/19/2022   Hemorrhoids 02/25/2021   Chronic pain of right knee 01/11/2021   Bipolar 1 disorder (HCC) 10/18/2018   Tobacco use disorder 08/20/2017   GAD (generalized anxiety disorder) 09/25/2016   Past Medical History:  Diagnosis Date   Abrasion of index finger 07/29/2013   left   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   Manic affective disorder  with recurrent episode (HCC)    Seasonal allergies    sore throat 07/29/2013   Skin problem 12/07/2021   Past Surgical History:  Procedure Laterality Date   CYST EXCISION PERINEAL N/A 07/12/2023   Procedure: SCROTUM EXPLORATION-excision of penile sebaceous cyst;  Surgeon: Sherrilee Belvie CROME, MD;  Location: [facility];  Service: Urology;  Laterality: N/A;   FOOT FOREIGN BODY REMOVAL Right 01/19/2011   KNEE ARTHROSCOPY Right 08/01/2013   Procedure: ARTHROSCOPY RIGHT KNEE,  Chondroplasty, Excision of Plica;  Surgeon: Norleen CROME Gavel, MD;  Location: [facility];  Service: Orthopedics;  Laterality: Right;   REPAIR EXTENSOR TENDON Right 02/15/2024   Procedure: REPAIR, TENDON, EXTENSOR;  Surgeon: Arlinda Buster, MD;  Location: [facility];  Service: Orthopedics;  Laterality: Right;  RIGHT WRIST EXTENSOR CARPI ULNARIS SUBSHEATH RECONSTRUCTION   SCROTAL EXPLORATION N/A 11/09/2022   Procedure: SCROTUM EXPLORATION-excision of scrotal sebaceous cyst;  Surgeon: Sherrilee Belvie CROME, MD;  Location: [facility];  Service: Urology;  Laterality: N/A;   Family History  Problem Relation Age of Onset   Depression Mother    Colon polyps Mother    Outpatient Medications Prior to Visit  Medication Sig Dispense Refill   acetaminophen  (TYLENOL ) 325 MG tablet Take 650 mg by mouth every 6 (six) hours as needed for mild pain (pain score 1-3) or moderate pain (pain score 4-6).     meloxicam  (MOBIC ) 15 MG tablet Take 1 tablet (15 mg total) by mouth daily. 30 tablet 0   pseudoephedrine (SUDAFED) 30 MG tablet Take 30 mg by mouth every 4 (four) hours as needed for congestion.     No facility-administered medications prior to visit.   Allergies  Allergen Reactions   Trazodone And Nefazodone Other (See Comments)    priapism   Amoxicillin Diarrhea   Amoxicillin-Pot Clavulanate Diarrhea    Diarrhea    Clavulanic Acid Other (See Comments)    Unknown     ROS: A complete ROS was performed with pertinent positives/negatives noted in the HPI. The remainder of the ROS are negative.    Objective:   Today's Vitals   05/14/24 1528  BP: 122/83  Pulse: 93  SpO2: 97%  Weight: 213 lb 14.4 oz (97 kg)  Height: 5' 9 (1.753 m)    Physical Exam   GENERAL: Well-appearing, in NAD. Well nourished.  SKIN: Pink, warm and dry. No rash, lesion, ulceration. Mild healing bruise present to left deltoid. No redness, induration, pain or drainage to IM injection site. .  Head:  Normocephalic. NECK: Trachea midline. Full ROM w/o pain or tenderness.  RESPIRATORY: Chest wall symmetrical. Respirations even and non-labored.  MSK: Muscle tone and strength appropriate for age.Mild tenderness with ROM to left wrist. No obvious deformities. Full ROM present to RUE without palpable tenderness or reproducible pain.  EXTREMITIES: Without clubbing, cyanosis, or edema.  GU: Circumcised. No penile discharge or lesions. Small induration present to left sided base of penis without redness, drainage.  No scrotal swelling or discoloration. Testes descended bilaterally. Epididymis non-tender. Chaperoned by : Elida Molt, CMA NEUROLOGIC: No motor or sensory deficits. Steady, even gait. C2-C12 intact.  PSYCH/MENTAL STATUS: Alert, oriented x 3. Cooperative, appropriate mood and affect.      Assessment & Plan:  1. Vaccine reaction, initial encounter (Primary) Small bruise present to left deltoid.  No signs of reaction at this time.  Discussed use of ice or heat if needed to the area.  2. Left wrist tendinitis Given failure of conservative measures, recommend  referral to orthopedics for further evaluation of left wrist tendinitis. - Ambulatory referral to Orthopedic Surgery  3. Penile cyst Referral placed to urology due to previous recurrence of penile cyst needing excision and drainage.  Patient will be placed on Doxycycline  100mg  BID and referral placed to Urology per patient request. If worsening or no improvement within 1 week, reach out to PCP.  - Ambulatory referral to Urology  4. Muscle strain of right shoulder region, initial encounter Patient likely overcompensating with PT. Will use NSAID, ice, heat and ROM exercises provided.     Meds ordered this encounter  Medications   doxycycline  (VIBRA -TABS) 100 MG tablet    Sig: Take 1 tablet (100 mg total) by mouth 2 (two) times daily.    Dispense:  14 tablet    Refill:  0    Supervising Provider:   DE PERU, RAYMOND J [[id number]]    Lab Orders  No laboratory test(s) ordered today    Return if symptoms worsen or fail to improve.    Patient to reach out to office if new, worrisome, or unresolved symptoms arise or if no improvement in patient's condition. Patient verbalized understanding and is agreeable to treatment plan. All questions answered to patient's satisfaction.    Thersia Schuyler Stark, [place]

## 2024-05-15 ENCOUNTER — Ambulatory Visit: Payer: MEDICAID

## 2024-05-15 DIAGNOSIS — M778 Other enthesopathies, not elsewhere classified: Secondary | ICD-10-CM

## 2024-05-15 DIAGNOSIS — R278 Other lack of coordination: Secondary | ICD-10-CM

## 2024-05-15 DIAGNOSIS — M25531 Pain in right wrist: Secondary | ICD-10-CM

## 2024-05-15 DIAGNOSIS — M6281 Muscle weakness (generalized): Secondary | ICD-10-CM

## 2024-05-15 DIAGNOSIS — M25631 Stiffness of right wrist, not elsewhere classified: Secondary | ICD-10-CM

## 2024-05-15 NOTE — Therapy (Signed)
 OUTPATIENT OCCUPATIONAL THERAPY ORTHO TREATMENT  Patient Name: Clinton Fisher MRN: [id number] DOB:[birth year], 33 y.o., male Today's Date: 05/15/2024  PCP: Knute Thersia Bitters, NP REFERRING PROVIDER: Arlinda Buster, MD  END OF SESSION:  OT End of Session - 05/15/24 0837     Visit Number 4    Number of Visits 12    Date for Recertification  06/12/24    Authorization Type [facility]    Authorization - Visit Number 4    Authorization - Number of Visits 12    OT Start Time [id number]    OT Stop Time 0928    OT Time Calculation (min) 53 min    Equipment Utilized During Treatment fluidotherapy, 2# dumbbell, bolster    Activity Tolerance Patient tolerated treatment well    Behavior During Therapy [facility] for tasks assessed/performed          Past Medical History:  Diagnosis Date   Abrasion of index finger 07/29/2013   left   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   Manic affective disorder with recurrent episode (HCC)    Seasonal allergies    sore throat 07/29/2013   Skin problem 12/07/2021   Past Surgical History:  Procedure Laterality Date   CYST EXCISION PERINEAL N/A 07/12/2023   Procedure: SCROTUM EXPLORATION-excision of penile sebaceous cyst;  Surgeon: Sherrilee Belvie CROME, MD;  Location: [facility];  Service: Urology;  Laterality: N/A;   FOOT FOREIGN BODY REMOVAL Right 01/19/2011   KNEE ARTHROSCOPY Right 08/01/2013   Procedure: ARTHROSCOPY RIGHT KNEE, Chondroplasty, Excision of Plica;  Surgeon: Norleen CROME Gavel, MD;  Location: [facility];  Service: Orthopedics;  Laterality: Right;   REPAIR EXTENSOR TENDON Right 02/15/2024   Procedure: REPAIR, TENDON, EXTENSOR;  Surgeon: Arlinda Buster, MD;  Location: [facility];  Service: Orthopedics;  Laterality: Right;  RIGHT WRIST EXTENSOR CARPI ULNARIS SUBSHEATH RECONSTRUCTION   SCROTAL EXPLORATION N/A 11/09/2022    Procedure: SCROTUM EXPLORATION-excision of scrotal sebaceous cyst;  Surgeon: Sherrilee Belvie CROME, MD;  Location: [facility];  Service: Urology;  Laterality: N/A;   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 02/15/2024   Penile cyst 07/12/2023   Class 1 obesity due to excess calories without serious comorbidity with body mass index (BMI) of 31.0 to 31.9 in adult 02/28/2023   Scrotal sebaceous cyst 11/09/2022   Tendinitis of flexor tendon of both hands 07/19/2022   Hemorrhoids 02/25/2021   Chronic pain of right knee 01/11/2021   Bipolar 1 disorder (HCC) 10/18/2018   Tobacco use disorder 08/20/2017   GAD (generalized anxiety disorder) 09/25/2016    ONSET DATE: 04/07/2024 referral date 02/15/24 surgery date   REFERRING DIAG: S63.091A (ICD-10-CM) - Subluxation of extensor carpi ulnaris tendon, right, initial encounter  THERAPY DIAG:  Other lack of coordination  Muscle weakness (generalized)  Wrist tendonitis  Stiffness of right wrist, not elsewhere classified  Pain in right wrist  Rationale for Evaluation and Treatment: Rehabilitation  SUBJECTIVE:   SUBJECTIVE STATEMENT: Pt reports no pain in hand, stating It's definitely not like it was before. Reports successful completion of scar tissue massage at home. Pt accompanied by: self  PERTINENT HISTORY: S/p 02/15/24 R wrist ECU reconstruction. Had eval on 02/28/24 with [facility] who fabricated R [place] orthosis to immobilize  wrist and forearm.  (REFER TO [place]  HAND PROTOCOL VOLUME I PGS. [id number])   PRECAUTIONS: None  RED FLAGS: None   WEIGHT BEARING RESTRICTIONS: No  PAIN:  Are you having pain? No  FALLS: Has patient fallen in last 6 months? 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  HAND DOMINANCE: Right  ADLs: WFL  FUNCTIONAL OUTCOME MEASURES: Upper Extremity Functional Scale (UEFS): 54/80, currently functioning at 67.5% of full potential  UPPER EXTREMITY ROM:     Active ROM Right eval Left eval  Shoulder flexion    Shoulder abduction    Shoulder adduction    Shoulder extension    Shoulder internal rotation    Shoulder external rotation    Elbow flexion    Elbow extension    Wrist flexion  10  Wrist extension  15  Wrist ulnar deviation  5  Wrist radial deviation  10  Wrist pronation    Wrist supination    (Blank rows = not tested)  Active ROM Right eval Left eval  Thumb MCP (0-60)    Thumb IP (0-80)    Thumb Radial abd/add (0-55)     Thumb Palmar abd/add (0-45)     Thumb Opposition to Small Finger     Index MCP (0-90)     Index PIP (0-100)     Index DIP (0-70)      Long MCP (0-90)      Long PIP (0-100)      Long DIP (0-70)      Ring MCP (0-90)      Ring PIP (0-100)      Ring DIP (0-70)      Little MCP (0-90)      Little PIP (0-100)      Little DIP (0-70)      (Blank rows = not tested)   UPPER EXTREMITY MMT:     MMT Right eval Left eval  Shoulder flexion    Shoulder abduction    Shoulder adduction    Shoulder extension    Shoulder internal rotation    Shoulder external rotation    Middle trapezius    Lower trapezius    Elbow flexion    Elbow extension    Wrist flexion    Wrist extension    Wrist ulnar deviation    Wrist radial deviation    Wrist pronation    Wrist supination    (Blank rows = not tested)  HAND FUNCTION: Grip strength: Right: 33 lbs; Left: 100 lbs  COORDINATION: 9 Hole Peg test: Right: 28.25 sec;  Left: 26.41 sec  SENSATION: Some numbness and tingling in R wrist   EDEMA: none  COGNITION: Overall cognitive status: Within functional limits for tasks assessed Areas of impairment: none  OBSERVATIONS: limited ROM in wrist, impaired grip strength, decrease in overall function   TREATMENT DATE: 05/15/24  Pt first began 8 minutes of fluidotherapy to promote loosening of stiff wrist muscles. While completing this pt completed active wrist flexion/extension and tendon glides of R hand. Pt tolerated well.   Pt reports obtaining massage gun for massaging scar area, reiterated importance of not applying  too much pressure on scar area. Pt verbalized understanding. Completed 1x10 passive wrist flexion, wrist extension, radial and ulnar deviation, holding end range 10 seconds. Min cues required for proper hand placement.  Educated in elbow and wrist isometrics, see Pt instructions for detailed handout. Min cues for proper hand placement.  Next completed 1x10 with 2# dumbbell wrist extension and wrist flexion.    PATIENT EDUCATION: Education details: SEE ABOVE Person educated: Patient Education method: Explanation, Demonstration, and Handouts Education comprehension: verbalized understanding and returned demonstration  HOME EXERCISE PROGRAM: 05/01/24: scar massage, wrist PROM (ACCESS CODE: [id number]) 05/05/24: Theraputty HEP with red putty ([id number])  05/13/24: Shoulder isometrics (per pt request): (ACCESS CODE: [id number]) 05/15/24: Elbow/forearm isometrics ([id number])     GOALS: Goals reviewed with patient? Yes  SHORT TERM GOALS: Target date: 05/29/24  Pt will be independent with scar massage and prescribed HEP for ROM and strengthening of R hand Baseline: Initiated this date Goal status: IN PROGRESS  2.  Pt will only wear [place] orthosis for comfort and activities involving >5 lbs resistance Baseline: Pt new to OP OT, wears constantly especially for driving  Goal status: GOAL  MET  3.  Pt will increase grip strength by at least 10 lbs in R hand Baseline: 33 lbs 05/13/24: 65.0 lbs Goal status: GOAL MET  4.  Pt will increase R wrist flexion and extension AROM by at least 15 degrees Baseline: Flexion 10 degrees, 15 degrees extension 05/13/24: 45 degrees flexion, 19 degrees extension  05/15/24: 50 degrees flexion, 45 degrees extension Goal status: GOAL MET    LONG TERM GOALS: Target date: 06/12/24  Pt will increase wrist flexion AROM by at least 50 degrees Baseline: 10 degrees  05/13/24: 45 degrees 05/15/24: 50 degrees  Goal status: IN PROGRESS  2.  Pt will increase wrist extension AROM by at least 45 degrees Baseline: 15 degrees  05/13/24: 19 degrees 05/15/24: 45 degrees Goal status: IN PROGRESS  3.  Pt will increase ulnar deviation ROM of R wrist by at least 20 degrees Baseline: 5 degrees  05/13/24: 9 degrees 05/15/24: 20 degrees  Goal status: INITIAL  4.  Pt will increase UEFS index score to at least 70/80, indicating increase of function by 20% Baseline: 54/80  Goal status: INITIAL  5.  Pt will increase R grip strength by at least 40 pounds  Baseline: 33 lbs 05/13/24: 65.0 lbs Goal status: IN PROGRESS    ASSESSMENT:  CLINICAL IMPRESSION: Patient is a 33 y.o. male who was seen today for occupational therapy tx for 02/15/24 R wrist ECU reconstruction d/t subluxation. Hx includes ECU tendon sheath reconstruction, bipolar disorder I, anxiety, B wrist tendonitis. Pt participating well with HEP and stretches, reports successful completion of scar tissue massage at home. Noted improvements in ROM measurements this date, see goals for updated measurements. Pt at this time would benefit from continued skilled services to improve strength and ROM in dominant R hand to carry over with work tasks and ADL/IADL.  PERFORMANCE DEFICITS: in functional skills including ADLs, IADLs, ROM, strength, pain, skin integrity, and UE functional  use.  IMPAIRMENTS: are limiting patient from ADLs, IADLs, and work.   COMORBIDITIES: may have co-morbidities  that affects occupational performance. Patient will benefit from skilled OT to address above impairments and improve overall function.  MODIFICATION OR ASSISTANCE TO COMPLETE EVALUATION: Min-Moderate modification of tasks or assist with assess necessary to complete an evaluation.  OT OCCUPATIONAL PROFILE AND HISTORY: Detailed assessment: Review of records and additional review of physical, cognitive,  psychosocial history related to current functional performance.  CLINICAL DECISION MAKING: Moderate - several treatment options, min-mod task modification necessary  REHAB POTENTIAL: Good  EVALUATION COMPLEXITY: Moderate      PLAN:  OT FREQUENCY: 2x/week  OT DURATION: 6 weeks  PLANNED INTERVENTIONS: 97168 OT Re-evaluation, 97535 self care/ADL training, 02889 therapeutic exercise, 97530 therapeutic activity, 97035 ultrasound, 97018 paraffin, 02960 fluidotherapy, 97010 moist heat, 97010 cryotherapy, 97034 contrast bath, 97760 Orthotic Initial, 97763 Orthotic/Prosthetic subsequent, scar mobilization, passive range of motion, coping strategies training, patient/family education, and DME and/or AE instructions  RECOMMENDED OTHER SERVICES: none at this time  CONSULTED AND AGREED WITH PLAN OF CARE: Patient  PLAN FOR NEXT SESSION: Fluidotherapy or ultrasound Wrist ROM/stretches and strengthening Wrist isometrics   Tisa Weisel, OT 05/15/2024, 9:28 AM

## 2024-05-16 ENCOUNTER — Encounter (HOSPITAL_BASED_OUTPATIENT_CLINIC_OR_DEPARTMENT_OTHER): Payer: Self-pay | Admitting: Emergency Medicine

## 2024-05-16 ENCOUNTER — Emergency Department (HOSPITAL_BASED_OUTPATIENT_CLINIC_OR_DEPARTMENT_OTHER)
Admission: EM | Admit: 2024-05-16 | Discharge: 2024-05-16 | Disposition: A | Discharge status: Home / Self Care | Payer: MEDICAID | Attending: Emergency Medicine | Admitting: Emergency Medicine

## 2024-05-16 ENCOUNTER — Emergency Department (HOSPITAL_BASED_OUTPATIENT_CLINIC_OR_DEPARTMENT_OTHER): Payer: MEDICAID

## 2024-05-16 ENCOUNTER — Other Ambulatory Visit: Payer: Self-pay

## 2024-05-16 DIAGNOSIS — L03115 Cellulitis of right lower limb: Secondary | ICD-10-CM | POA: Insufficient documentation

## 2024-05-16 DIAGNOSIS — N503 Cyst of epididymis: Secondary | ICD-10-CM | POA: Insufficient documentation

## 2024-05-16 DIAGNOSIS — N50812 Left testicular pain: Secondary | ICD-10-CM | POA: Diagnosis present

## 2024-05-16 LAB — URINALYSIS, ROUTINE W REFLEX MICROSCOPIC
Bacteria, UA: NONE SEEN
Bilirubin Urine: NEGATIVE
Glucose, UA: NEGATIVE mg/dL
Ketones, ur: NEGATIVE mg/dL
Leukocytes,Ua: NEGATIVE
Nitrite: NEGATIVE
Protein, ur: 30 mg/dL — AB
Specific Gravity, Urine: 1.032 — ABNORMAL HIGH (ref 1.005–1.030)
pH: 5.5 (ref 5.0–8.0)

## 2024-05-16 NOTE — Discharge Instructions (Addendum)
 Your scrotal/testicular ultrasound was reassuring today.  There are no signs of infection.  We saw a small cyst on the left epididymis (top part of the left testicle).  This could be causing your left testicular discomfort. This does not require any additional treatment.  If this area becomes very bothersome to you, you can follow-up with urology for further evaluation. I have included information for our urology office below.  The area on your right thigh is consistent with a small skin infection.  Please continue to take the antibiotic, doxycycline , as prescribed by your primary care provider for treatment of this infection.  Please follow-up with your PCP within the next 3 days for recheck of this area.  Please apply warm compresses to the right thigh infection 2-3 times daily to help promote drainage.  If the area comes to a head (like a pimple), but does not drain, you need to be evaluated in the ER or by your PCP as it may need to be drained.   Your urine did not show any signs of infection.  You appear to be slightly dehydrated.  Please increase your water intake at home to keep your urine a light yellow color.  Please return to the ER for any severe increase in pain, fevers, any other new or concerning symptoms

## 2024-05-16 NOTE — ED Triage Notes (Signed)
 Reports having cyst on penis and started taking antibiotics yesterday. Started having R sided testicle and groin pain with swelling last night. Denies fevers.

## 2024-05-16 NOTE — ED Provider Notes (Signed)
 [place] EMERGENCY DEPARTMENT AT [facility] Provider Note   CSN: [id number] Arrival date & time: 05/16/24  9171     Patient presents with: Testicle Pain   Clinton Fisher is a 33 y.o. male with no significant past medical history presents with concern for a cyst on his penis and left-sided testicular pain.  He states that he had a cyst on his penis for the past week or so.  There is no drainage out of the spot.  He reports that this morning, he developed left-sided testicular pain as well as a painful bump on the right inner thigh.  The testicular pain was not sudden onset.  He reports a small amount of drainage from the spot on his right thigh.  He denies any recent sexual intercourse and denies any concern for STIs.  Denies any dysuria, materia, increased frequency.  Denies any penile discharge.  No fever or chills.  He was started on a course of doxycycline  by his PCP.  He took his first dose of doxycycline  last night.    Testicle Pain       Prior to Admission medications   Medication Sig Start Date End Date Taking? Authorizing Provider  acetaminophen  (TYLENOL ) 325 MG tablet Take 650 mg by mouth every 6 (six) hours as needed for mild pain (pain score 1-3) or moderate pain (pain score 4-6).    [provider]  doxycycline  (VIBRA -TABS) 100 MG tablet Take 1 tablet (100 mg total) by mouth 2 (two) times daily. 05/14/24   Caudle, Thersia Bitters, FNP  meloxicam  (MOBIC ) 15 MG tablet Take 1 tablet (15 mg total) by mouth daily. 04/09/24   Caudle, Thersia Bitters, FNP  pseudoephedrine (SUDAFED) 30 MG tablet Take 30 mg by mouth every 4 (four) hours as needed for congestion.    [provider]    Allergies: Trazodone and nefazodone, Amoxicillin, Amoxicillin-pot clavulanate, and Clavulanic acid    Review of Systems  Genitourinary:  Positive for testicular pain.    Updated Vital Signs BP 124/81 (BP Location: Right Arm)   Pulse 91   Temp 98 F (36.7 C) (Oral)    Resp 18   SpO2 96%   Physical Exam Vitals and nursing note reviewed. Exam conducted with a chaperone present.  Constitutional:      Appearance: Normal appearance.  HENT:     Head: Atraumatic.  Cardiovascular:     Rate and Rhythm: Normal rate and regular rhythm.  Pulmonary:     Effort: Pulmonary effort is normal.  Genitourinary:     Comments: RN Ronal Fellows present to chaperone GU exam  I did not appreciate any cyst on patient's penis.  Penis without any overlying erythema, wounds.  No penile discharge.  Patient is circumcised.  Patient without tenderness to the testicles or epididymis bilaterally.  I do not appreciate any obvious cystocele, varicocele.  No scrotal erythema or edema.  No high riding testicle.  Skin:    Comments: Area of erythema and induration approximately 2 cm in diameter on patient's inner right thigh.  No areas of fluctuance.  Neurological:     General: No focal deficit present.     Mental Status: He is alert.  Psychiatric:        Mood and Affect: Mood normal.        Behavior: Behavior normal.     (all labs ordered are listed, but only abnormal results are displayed) Labs Reviewed  URINALYSIS, ROUTINE W REFLEX MICROSCOPIC - Abnormal; Notable for the following  components:      Result Value   Specific Gravity, Urine 1.032 (*)    Hgb urine dipstick TRACE (*)    Protein, ur 30 (*)    All other components within normal limits    EKG: None  Radiology: US  SCROTUM W/DOPPLER Result Date: 05/16/2024 EXAM: ULTRASOUND SCROTUM/TESTICLES WITH DOPPLER FLOW EVALUATION 05/16/2024 09:59:01 AM TECHNIQUE: Duplex ultrasound using B-mode/gray scaled imaging, Doppler spectral analysis and color flow Doppler was obtained of the testicles. COMPARISON: 11/13/2022 CLINICAL HISTORY: Pt reporting left testicle pain. FINDINGS: RIGHT: MEASUREMENTS: Right testicle 4.2 x 2.4 x 2.7 cm. GREY SCALE: The right testicle demonstrates normal homogeneous echotexture without focal lesion.  No testicular microlithiasis. DOPPLER EVALUATION: There is normal arterial and venous Doppler flow within the testicle. VARICOCELE: No scrotal varicocele. SCROTAL SAC: No hydrocele. EPIDIDYMIS: 5 mm simple-appearing right epididymal cyst. LEFT: MEASUREMENTS: Left testicle 4.4 x 2.1 x 3.3 cm. GREY SCALE: The left testicle demonstrates normal homogeneous echotexture without focal lesion. No testicular microlithiasis. DOPPLER EVALUATION: There is normal arterial and venous Doppler flow within the testicle. VARICOCELE: No scrotal varicocele. SCROTAL SAC: No hydrocele. EPIDIDYMIS: 4 mm simple-appearing left epididymal cyst. IMPRESSION: 1. No acute findings. Electronically signed by: Katheleen Faes MD 05/16/2024 10:23 AM EDT RP Workstation: [id number]     .Ultrasound ED Soft Tissue  Date/Time: 05/16/2024 9:58 AM  Performed by: Veta Palma, PA-C Authorized by: Veta Palma, PA-C   Procedure details:    Indications: localization of abscess and evaluate for cellulitis     Transverse view:  Visualized   Longitudinal view:  Visualized   Images: archived   Location:    Location comment:  Right upper thigh Findings:     no abscess present    cellulitis present    Medications Ordered in the ED - No data to display                                  Medical Decision Making Amount and/or Complexity of Data Reviewed Labs: ordered. Radiology: ordered.     Differential diagnosis includes but is not limited to testicular torsion, epididymitis, orchitis, urinary tract infection, STI, abscess, cyst  ED Course:  Upon initial evaluation, patient is very well-appearing, no acute distress.  Stable vitals.  RN chaperone present for GU exam.  Patient reports a cyst on his penis for the past week, but I do not appreciate any cyst on exam today.  He does report that this has gone down in size over the past week.  Penis without any overlying erythema, no wounds, no penile discharge.  He also reports  some testicular pain.  He denies any acute onset of testicular pain, very well-appearing, no high riding testicle, I have very low clinical concern for testicular torsion at this time.  However, will obtain testicular ultrasound for further evaluation of his concern.  He does not have any tenderness to palpation of the testicle or epididymitis on exam.  No testicular erythema or edema.  I do not appreciate any obvious varicoceles or cystoceles.   Labs Ordered: I Ordered, and personally interpreted labs.  The pertinent results include:   Urinalysis with high specific gravity, proteinuria.  No signs of infection  Imaging Studies ordered: I ordered imaging studies including scrotal US  I independently visualized the imaging with scope of interpretation limited to determining acute life threatening conditions related to emergency care. Imaging showed  EPIDIDYMIS:  4 mm simple-appearing left epididymal cyst.  IMPRESSION:  1. No acute findings.   I agree with the radiologist interpretation   Medications Given: None  Upon re-evaluation, patient remains well appearing.  Scrotal ultrasound is reassuring without any signs of testicular torsion.  There is a simple 4 mm left epididymal cyst noted that could explain patient's left testicular discomfort.  Ultrasound does not have any signs of orchitis or epididymitis, and physical exam is also reassuring without any scrotal erythema, edema, or testicular tenderness.  He denies any sexual intercourse or concern for STIs, no dysuria, no penile discharge, doubt STI. No indication for STI testing or prophylactic treatment at this time. Urinalysis without signs of infection.  Doubt any infectious etiology to his testicular pain at this time.  Regarding patient's bump on the right inner thigh, this seems consistent with a cellulitis.  He has an area of erythema and induration approximately 2 cm in diameter on the right inner thigh.  There is no active drainage.   No areas of fluctuance.  I did perform a bedside ultrasound which showed cobblestoning, but no hypoechoic areas that would be consistent with abscess.  No indication for I&D today. He has only taken 1 dose of his doxycycline , will have him continue with full course as prescribed by his PCP.   Patient stable and appropriate for discharge home at this time  Impression: Left epididymal cyst Right thigh cellulitis  Disposition:  The patient was discharged home with instructions to take course of doxycycline  as prescribed by PCP.  Apply warm compresses to area on his right thigh to help with drainage.  He understands that if this area comes to ahead, but does not drain on its own, he needs to return to the ER or to his PCP for evaluation for possible I&D.  He states he has an appointment with his urologist in early November, encouraged him to attend this appointment for further evaluation and management of his epididymal cyst. Return precautions given and patient verbalized understanding.    This chart was dictated using voice recognition software, Dragon. Despite the best efforts of this provider to proofread and correct errors, errors may still occur which can change documentation meaning.       Final diagnoses:  Epididymal cyst  Cellulitis of right thigh    ED Discharge Orders     None          Veta Palma, DEVONNA 05/16/24 1050    Doretha Folks, MD 05/26/24 1302

## 2024-05-19 ENCOUNTER — Telehealth (HOSPITAL_BASED_OUTPATIENT_CLINIC_OR_DEPARTMENT_OTHER): Payer: Self-pay | Admitting: *Deleted

## 2024-05-19 NOTE — Telephone Encounter (Signed)
 Routing to [employer] as an FINANCIAL PLANNER for review of ED note.

## 2024-05-19 NOTE — Telephone Encounter (Signed)
 Copied from CRM [id number]. Topic: Appointments - Appointment Scheduling >> May 19, 2024  8:23 AM Clinton Fisher wrote: Was in ER for skin infection.. ( and check if needs to be drained )

## 2024-05-20 ENCOUNTER — Ambulatory Visit: Payer: MEDICAID

## 2024-05-20 DIAGNOSIS — M25631 Stiffness of right wrist, not elsewhere classified: Secondary | ICD-10-CM

## 2024-05-20 DIAGNOSIS — M778 Other enthesopathies, not elsewhere classified: Secondary | ICD-10-CM | POA: Diagnosis not present

## 2024-05-20 DIAGNOSIS — M6281 Muscle weakness (generalized): Secondary | ICD-10-CM

## 2024-05-20 DIAGNOSIS — R208 Other disturbances of skin sensation: Secondary | ICD-10-CM

## 2024-05-20 DIAGNOSIS — R278 Other lack of coordination: Secondary | ICD-10-CM

## 2024-05-20 DIAGNOSIS — M25531 Pain in right wrist: Secondary | ICD-10-CM

## 2024-05-20 NOTE — Therapy (Addendum)
 OUTPATIENT OCCUPATIONAL THERAPY ORTHO TREATMENT  Patient Name: Clinton Fisher MRN: [id number] DOB:12-Jul-1991, 33 y.o., male Today's Date: 05/20/2024  PCP: Knute Thersia Bitters, NP REFERRING PROVIDER: Arlinda Buster, MD  END OF SESSION:    05/20/24 1237  OT Visits / Re-Eval  Visit Number 5  Number of Visits 12  Date for Recertification  06/12/24  Authorization  Authorization Type [employer]  Authorization - Visit Number 5  OT Time Calculation  OT Start Time 1232  OT Stop Time 1310  OT Time Calculation (min) 38 min  End of Session  Equipment Utilized During Treatment ultrasound, 2# dumbbell, bolster  Activity Tolerance Patient tolerated treatment well  Behavior During Therapy WFL for tasks assessed/performed    Past Medical History:  Diagnosis Date   Abrasion of index finger 07/29/2013   left   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   Manic affective disorder with recurrent episode (HCC)    Seasonal allergies    sore throat 07/29/2013   Skin problem 12/07/2021   Past Surgical History:  Procedure Laterality Date   CYST EXCISION PERINEAL N/A 07/12/2023   Procedure: SCROTUM EXPLORATION-excision of penile sebaceous cyst;  Surgeon: Sherrilee Belvie CROME, MD;  Location: [facility];  Service: Urology;  Laterality: N/A;   FOOT FOREIGN BODY REMOVAL Right 01/19/2011   KNEE ARTHROSCOPY Right 08/01/2013   Procedure: ARTHROSCOPY RIGHT KNEE, Chondroplasty, Excision of Plica;  Surgeon: Norleen CROME Gavel, MD;  Location: [facility];  Service: Orthopedics;  Laterality: Right;   REPAIR EXTENSOR TENDON Right 02/15/2024   Procedure: REPAIR, TENDON, EXTENSOR;  Surgeon: Arlinda Buster, MD;  Location: [facility];  Service: Orthopedics;  Laterality: Right;  RIGHT WRIST EXTENSOR CARPI ULNARIS SUBSHEATH RECONSTRUCTION   SCROTAL EXPLORATION N/A 11/09/2022   Procedure: SCROTUM  EXPLORATION-excision of scrotal sebaceous cyst;  Surgeon: Sherrilee Belvie CROME, MD;  Location: [facility];  Service: Urology;  Laterality: N/A;   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 02/15/2024   Penile cyst 07/12/2023   Class 1 obesity due to excess calories without serious comorbidity with body mass index (BMI) of 31.0 to 31.9 in adult 02/28/2023   Scrotal sebaceous cyst 11/09/2022   Tendinitis of flexor tendon of both hands 07/19/2022   Hemorrhoids 02/25/2021   Chronic pain of right knee 01/11/2021   Bipolar 1 disorder (HCC) 10/18/2018   Tobacco use disorder 08/20/2017   GAD (generalized anxiety disorder) 09/25/2016    ONSET DATE: 04/07/2024 referral date 02/15/24 surgery date   REFERRING DIAG: S63.091A (ICD-10-CM) - Subluxation of extensor carpi ulnaris tendon, right, initial encounter  THERAPY DIAG:  Other lack of coordination Muscle weakness (generalized) Other disturbances of skin sensation Stiffness of right wrist, not elsewhere classified Pain in right wrist  Rationale for Evaluation and Treatment: Rehabilitation  SUBJECTIVE:   SUBJECTIVE STATEMENT: Pt reports no pain in hand, stating It's definitely not like it was before. Reports successful completion of scar tissue massage at home. Pt accompanied by: self  PERTINENT HISTORY: S/p 02/15/24 R wrist ECU reconstruction. Had eval on 02/28/24 with [facility] who fabricated R [place] orthosis to immobilize wrist and forearm.  (REFER TO [place]  HAND PROTOCOL VOLUME I PGS. [id number])   PRECAUTIONS: None  RED FLAGS: None   WEIGHT BEARING RESTRICTIONS: No  PAIN:  Are you having pain? No  FALLS: Has patient fallen in last 6 months? 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  HAND DOMINANCE: Right  ADLs: WFL  FUNCTIONAL OUTCOME MEASURES: Upper Extremity Functional Scale (UEFS): 54/80, currently functioning at 67.5% of full potential  UPPER EXTREMITY ROM:     Active ROM Right eval Left eval  Shoulder flexion    Shoulder abduction    Shoulder adduction    Shoulder extension    Shoulder internal rotation    Shoulder external rotation    Elbow flexion    Elbow extension    Wrist flexion  10  Wrist extension  15  Wrist ulnar deviation  5  Wrist radial deviation  10  Wrist pronation    Wrist supination    (Blank rows = not tested)  Active ROM Right eval Left eval  Thumb MCP (0-60)    Thumb IP (0-80)    Thumb Radial abd/add (0-55)     Thumb Palmar abd/add (0-45)     Thumb Opposition to Small Finger     Index MCP (0-90)     Index PIP (0-100)     Index DIP (0-70)      Long MCP (0-90)      Long PIP (0-100)      Long DIP (0-70)      Ring MCP (0-90)      Ring PIP (0-100)      Ring DIP (0-70)      Little MCP (0-90)      Little PIP (0-100)      Little DIP (0-70)      (Blank rows = not tested)   UPPER EXTREMITY MMT:     MMT Right eval Left eval  Shoulder flexion    Shoulder abduction    Shoulder adduction    Shoulder extension    Shoulder internal rotation    Shoulder external rotation    Middle trapezius    Lower trapezius    Elbow flexion    Elbow extension    Wrist flexion    Wrist extension    Wrist ulnar deviation    Wrist radial deviation    Wrist pronation    Wrist supination    (Blank rows = not tested)  HAND FUNCTION: Grip strength: Right: 33 lbs; Left: 100 lbs  COORDINATION: 9 Hole Peg test: Right: 28.25 sec;  Left: 26.41 sec  SENSATION: Some numbness and tingling in R wrist   EDEMA: none  COGNITION: Overall cognitive status: Within functional limits for tasks assessed Areas of impairment: none  OBSERVATIONS: limited ROM in wrist, impaired grip strength, decrease in overall function   TREATMENT DATE: 05/20/24  Ultrasound applied to scar area on R ECU area, 3.3 Mhz, 20% duty cycle, 1.0 w/cm2 for 8 minutes.   Next completed wrist strengthening/stretching. With bolster and 2# dumbbell completed 10 reps wrist flexion/extension and wrist radial/ulnar deviation.  Educated in wrist isometrics (flexion and extension). See Pt instructions for details.   Finally used tan wrist bar to promote wrist flexion/extension with resistance, min to mod cues for proper form. Pt participated well.  PATIENT EDUCATION: Education details: SEE ABOVE Person educated: Patient Education method: Explanation, Demonstration, and Handouts Education comprehension: verbalized understanding and returned demonstration  HOME EXERCISE PROGRAM: 05/01/24: scar massage, wrist PROM (ACCESS CODE: [id number]) 05/05/24: Theraputty HEP with red putty ([id number])  05/13/24: Shoulder isometrics (per pt request): (ACCESS CODE: [id number]) 05/15/24: Elbow/forearm isometrics ([id number]) 05/20/24: Wrist flexion/extension isometrics ( [id number])     GOALS: Goals reviewed with patient? Yes  SHORT TERM GOALS: Target date: 05/29/24  Pt will be independent with scar massage and prescribed HEP for ROM and strengthening of R hand Baseline: Initiated this date Goal status: IN PROGRESS  2.  Pt will only wear [place] orthosis for comfort and activities involving >5 lbs resistance Baseline: Pt new to OP OT, wears constantly especially for driving  Goal status: GOAL MET  3.  Pt will increase grip strength by at least 10 lbs in R hand Baseline: 33 lbs 05/13/24: 65.0 lbs Goal status: GOAL MET  4.  Pt will increase R wrist flexion and  extension AROM by at least 15 degrees Baseline: Flexion 10 degrees, 15 degrees extension 05/13/24: 45 degrees flexion, 19 degrees extension  05/15/24: 50 degrees flexion, 45 degrees extension Goal status: GOAL MET    LONG TERM GOALS: Target date: 06/12/24  Pt will increase wrist flexion AROM by at least 50 degrees Baseline: 10 degrees  05/13/24: 45 degrees 05/15/24: 50 degrees  Goal status: IN PROGRESS  2.  Pt will increase wrist extension AROM by at least 45 degrees Baseline: 15 degrees  05/13/24: 19 degrees 05/15/24: 45 degrees Goal status: IN PROGRESS  3.  Pt will increase ulnar deviation ROM of R wrist by at least 20 degrees Baseline: 5 degrees  05/13/24: 9 degrees 05/15/24: 20 degrees  Goal status: INITIAL  4.  Pt will increase UEFS index score to at least 70/80, indicating increase of function by 20% Baseline: 54/80  Goal status: INITIAL  5.  Pt will increase R grip strength by at least 40 pounds  Baseline: 33 lbs 05/13/24: 65.0 lbs Goal status: IN PROGRESS    ASSESSMENT:  CLINICAL IMPRESSION: Patient is a 33 y.o. male who was seen today for occupational therapy tx for 02/15/24 R wrist ECU reconstruction d/t subluxation. Hx includes ECU tendon sheath reconstruction, bipolar disorder I, anxiety, B wrist tendonitis. Pt participating well with HEP and stretches, reports successful completion of scar tissue massage at home. Noted improvements in ROM measurements this date, see goals for updated measurements. Pt at this time would benefit from continued skilled services to improve strength and ROM in dominant R hand to carry over with work tasks and ADL/IADL.  PERFORMANCE DEFICITS: in functional skills including ADLs, IADLs, ROM, strength, pain, skin integrity, and UE functional use.  IMPAIRMENTS: are limiting patient from ADLs, IADLs, and work.   COMORBIDITIES: may have co-morbidities  that affects occupational performance. Patient will benefit from skilled OT to  address above impairments and improve overall function.  MODIFICATION OR ASSISTANCE TO COMPLETE EVALUATION: Min-Moderate modification of tasks or assist with assess necessary to complete an evaluation.  OT OCCUPATIONAL PROFILE AND HISTORY: Detailed assessment: Review of records and additional review of physical, cognitive, psychosocial history related to current functional performance.  CLINICAL DECISION MAKING: Moderate - several treatment options, min-mod task modification necessary  REHAB POTENTIAL: Good  EVALUATION COMPLEXITY: Moderate      PLAN:  OT FREQUENCY: 2x/week  OT DURATION: 6 weeks  PLANNED INTERVENTIONS: 97168 OT Re-evaluation, 97535 self care/ADL training, 02889 therapeutic exercise, 97530 therapeutic activity, [phone]  ultrasound, 02981 paraffin, 97039 fluidotherapy, 97010 moist heat, 97010 cryotherapy, 97034 contrast bath, 97760 Orthotic Initial, 97763 Orthotic/Prosthetic subsequent, scar mobilization, passive range of motion, coping strategies training, patient/family education, and DME and/or AE instructions  RECOMMENDED OTHER SERVICES: none at this time  CONSULTED AND AGREED WITH PLAN OF CARE: Patient  PLAN FOR NEXT SESSION: Fluidotherapy or ultrasound Wrist ROM/stretches and strengthening Wrist isometrics Obtain measurements/functional outcomes   Rocky Dutch, OT 05/20/2024, 12:35 PM

## 2024-05-21 ENCOUNTER — Encounter (HOSPITAL_BASED_OUTPATIENT_CLINIC_OR_DEPARTMENT_OTHER): Payer: Self-pay | Admitting: Family Medicine

## 2024-05-21 ENCOUNTER — Ambulatory Visit (INDEPENDENT_AMBULATORY_CARE_PROVIDER_SITE_OTHER): Payer: MEDICAID | Admitting: Family Medicine

## 2024-05-21 VITALS — BP 123/89 | HR 97 | Temp 98.4°F | Resp 18 | Ht 69.0 in | Wt 217.0 lb

## 2024-05-21 DIAGNOSIS — L723 Sebaceous cyst: Secondary | ICD-10-CM | POA: Diagnosis not present

## 2024-05-21 NOTE — Progress Notes (Signed)
 Subjective:   Clinton Fisher [birth year] 05/21/2024  Chief Complaint  Patient presents with   Cyst    Inner right thigh      HPI: TIERNAN SUTO presents today for re-assessment and management of chronic medical conditions.   Patient states he has developed cyst to right thigh developing on 10/23. Patient went to ER and was recommended to continue on doxycyline that was started for epididymal cyst. Patient states the area has not improved much since ER visit. He states pain has improved but there area is hard and tender. Denies drainage, fever, chills.   The following portions of the patient's history were reviewed and updated as appropriate: past medical history, past surgical history, family history, social history, allergies, medications, and problem list.   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 02/15/2024   Penile cyst 07/12/2023   Class 1 obesity due to excess calories without serious comorbidity with body mass index (BMI) of 31.0 to 31.9 in adult 02/28/2023   Scrotal sebaceous cyst 11/09/2022   Tendinitis of flexor tendon of both hands 07/19/2022   Hemorrhoids 02/25/2021   Chronic pain of right knee 01/11/2021   Bipolar 1 disorder (HCC) 10/18/2018   Tobacco use disorder 08/20/2017   GAD (generalized anxiety disorder) 09/25/2016   Past Medical History:  Diagnosis Date   Abrasion of index finger 07/29/2013   left   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   Manic affective disorder with recurrent episode (HCC)    Seasonal allergies    sore throat 07/29/2013   Skin problem 12/07/2021   Past Surgical History:  Procedure Laterality Date   CYST EXCISION PERINEAL N/A 07/12/2023   Procedure: SCROTUM EXPLORATION-excision of penile  sebaceous cyst;  Surgeon: Sherrilee Belvie CROME, MD;  Location: [facility];  Service: Urology;  Laterality: N/A;   FOOT FOREIGN BODY REMOVAL Right 01/19/2011   KNEE ARTHROSCOPY Right 08/01/2013   Procedure: ARTHROSCOPY RIGHT KNEE, Chondroplasty, Excision of Plica;  Surgeon: Norleen CROME Gavel, MD;  Location: [facility];  Service: Orthopedics;  Laterality: Right;   REPAIR EXTENSOR TENDON Right 02/15/2024   Procedure: REPAIR, TENDON, EXTENSOR;  Surgeon: Arlinda Buster, MD;  Location: [facility];  Service: Orthopedics;  Laterality: Right;  RIGHT WRIST EXTENSOR CARPI ULNARIS SUBSHEATH RECONSTRUCTION   SCROTAL EXPLORATION N/A 11/09/2022   Procedure: SCROTUM EXPLORATION-excision of scrotal sebaceous cyst;  Surgeon: Sherrilee Belvie CROME, MD;  Location: [facility];  Service: Urology;  Laterality: N/A;   Family History  Problem Relation Age of Onset   Depression Mother    Colon polyps Mother    Outpatient Medications Prior to Visit  Medication Sig Dispense Refill   acetaminophen  (TYLENOL ) 325 MG tablet Take 650 mg by mouth every 6 (six) hours as needed for mild pain (pain score 1-3) or moderate pain (pain score 4-6).     doxycycline  (VIBRA -TABS) 100 MG tablet Take 1 tablet (100 mg total) by mouth 2 (two) times daily. 14 tablet 0   meloxicam  (MOBIC ) 15 MG tablet Take 1 tablet (15 mg total) by mouth daily. 30 tablet 0   pseudoephedrine (SUDAFED) 30 MG tablet Take 30 mg by mouth every 4 (four) hours as needed for congestion.     No facility-administered  medications prior to visit.   Allergies  Allergen Reactions   Trazodone And Nefazodone Other (See Comments)    priapism   Amoxicillin Diarrhea   Amoxicillin-Pot Clavulanate Diarrhea    Diarrhea    Clavulanic Acid Other (See Comments)    Unknown     ROS: A complete ROS was performed with pertinent positives/negatives noted in the HPI. The remainder of the ROS are negative.    Objective:   Today's Vitals   05/21/24 1017  BP:  123/89  Pulse: 97  Resp: 18  Temp: 98.4 F (36.9 C)  SpO2: 98%  Weight: 217 lb (98.4 kg)  Height: 5' 9 (1.753 m)  PainSc: 0-No pain    Physical Exam   GENERAL: Well-appearing, in NAD. Well nourished.  SKIN: Pink, warm and dry. Small 0.5cm induration without redness, swelling, tenderness or drainage to right inner thigh.  Head: Normocephalic. NECK: Trachea midline. Full ROM w/o pain or tenderness.  RESPIRATORY: Chest wall symmetrical. Respirations even and non-labored.  MSK: Muscle tone and strength appropriate for age.  NEUROLOGIC: No motor or sensory deficits. Steady, even gait. C2-C12 intact.  PSYCH/MENTAL STATUS: Alert, oriented x 3. Cooperative, appropriate mood and affect.     Assessment & Plan:  1. Sebaceous cyst (Primary) No signs of infection or cellulitis upon exam. Patient would likely benefit from excision of cyst by Dermatology to prevent recurrence. Referral placed. Pt has completed Doxy abx and will continue warm compreses, sitz baths as needed. Will reach out if signs of infection occur.  - Ambulatory referral to Dermatology  No images are attached to the encounter or orders placed in the encounter.  Return if symptoms worsen or fail to improve.    Patient to reach out to office if new, worrisome, or unresolved symptoms arise or if no improvement in patient's condition. Patient verbalized understanding and is agreeable to treatment plan. All questions answered to patient's satisfaction.    Thersia Schuyler Stark, [place]

## 2024-05-21 NOTE — Patient Instructions (Signed)
 Continue use or warm compresses, sitz baths.   If redness occurs or swelling, let PCP know.

## 2024-05-22 ENCOUNTER — Telehealth (HOSPITAL_BASED_OUTPATIENT_CLINIC_OR_DEPARTMENT_OTHER): Payer: Self-pay | Admitting: Family Medicine

## 2024-05-22 ENCOUNTER — Ambulatory Visit: Payer: MEDICAID

## 2024-05-22 NOTE — Telephone Encounter (Signed)
 Copied from CRM #[id number]. Topic: Referral - Question >> May 22, 2024 10:16 AM Clinton Fisher wrote: Reason for CRM: Referal to dermatologist to get something removed, opening was not until June of 2026. Pt would like to be seen sooner. Could there be a referral sent to other provider? Please advise #[phone]

## 2024-05-23 NOTE — Telephone Encounter (Signed)
 Thersia,  Please see message sent back and advise if you know of another dermatology office that pt might be able to be referred to.

## 2024-05-26 ENCOUNTER — Ambulatory Visit: Payer: MEDICAID | Attending: Orthopedic Surgery

## 2024-05-26 ENCOUNTER — Encounter: Payer: Self-pay | Admitting: Radiology

## 2024-05-26 DIAGNOSIS — R208 Other disturbances of skin sensation: Secondary | ICD-10-CM | POA: Insufficient documentation

## 2024-05-26 DIAGNOSIS — M25531 Pain in right wrist: Secondary | ICD-10-CM | POA: Diagnosis present

## 2024-05-26 DIAGNOSIS — M6281 Muscle weakness (generalized): Secondary | ICD-10-CM | POA: Diagnosis present

## 2024-05-26 DIAGNOSIS — M25631 Stiffness of right wrist, not elsewhere classified: Secondary | ICD-10-CM | POA: Diagnosis present

## 2024-05-26 DIAGNOSIS — R41840 Attention and concentration deficit: Secondary | ICD-10-CM | POA: Insufficient documentation

## 2024-05-26 DIAGNOSIS — R29898 Other symptoms and signs involving the musculoskeletal system: Secondary | ICD-10-CM | POA: Diagnosis present

## 2024-05-26 DIAGNOSIS — R278 Other lack of coordination: Secondary | ICD-10-CM | POA: Diagnosis present

## 2024-05-26 NOTE — Therapy (Signed)
 OUTPATIENT OCCUPATIONAL THERAPY ORTHO TREATMENT  Patient Name: Clinton Fisher MRN: [id number] DOB:10-22-1990, 33 y.o., male Today's Date: 05/26/2024  PCP: Knute Thersia Bitters, NP REFERRING PROVIDER: Arlinda Buster, MD  END OF SESSION:  OT End of Session - 05/26/24 0925     Visit Number 6    Number of Visits 12    Date for Recertification  06/12/24    Authorization Type [employer]    Authorization - Visit Number 6    Authorization - Number of Visits 12    OT Start Time 0845    OT Stop Time 0930    OT Time Calculation (min) 45 min    Equipment Utilized During Treatment ultrasound, 3# dumbbell, bolster    Activity Tolerance Patient tolerated treatment well    Behavior During Therapy [facility] for tasks assessed/performed            OT End of Session - 05/26/24 0925     Visit Number 6    Number of Visits 12    Date for Recertification  06/12/24    Authorization Type [employer]    Authorization - Visit Number 6    Authorization - Number of Visits 12    OT Start Time 0845    OT Stop Time 0930    OT Time Calculation (min) 45 min    Equipment Utilized During Treatment ultrasound, 3# dumbbell, bolster    Activity Tolerance Patient tolerated treatment well    Behavior During Therapy [facility] for tasks assessed/performed            Past Medical History:  Diagnosis Date   Abrasion of index finger 07/29/2013   left   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   Manic affective disorder with recurrent episode (HCC)    Seasonal allergies    sore throat 07/29/2013   Skin problem 12/07/2021   Past Surgical History:  Procedure Laterality Date   CYST EXCISION PERINEAL N/A 07/12/2023   Procedure: SCROTUM EXPLORATION-excision of penile sebaceous cyst;  Surgeon: Sherrilee Belvie CROME, MD;  Location: [facility];  Service: Urology;  Laterality: N/A;   FOOT FOREIGN BODY REMOVAL Right 01/19/2011    KNEE ARTHROSCOPY Right 08/01/2013   Procedure: ARTHROSCOPY RIGHT KNEE, Chondroplasty, Excision of Plica;  Surgeon: Norleen CROME Gavel, MD;  Location: [facility];  Service: Orthopedics;  Laterality: Right;   REPAIR EXTENSOR TENDON Right 02/15/2024   Procedure: REPAIR, TENDON, EXTENSOR;  Surgeon: Arlinda Buster, MD;  Location: [facility];  Service: Orthopedics;  Laterality: Right;  RIGHT WRIST EXTENSOR CARPI ULNARIS SUBSHEATH RECONSTRUCTION   SCROTAL EXPLORATION N/A 11/09/2022   Procedure: SCROTUM EXPLORATION-excision of scrotal sebaceous cyst;  Surgeon: Sherrilee Belvie CROME, MD;  Location: [facility];  Service: Urology;  Laterality: N/A;   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 02/15/2024   Penile cyst 07/12/2023   Class 1 obesity due to excess calories without serious comorbidity with body mass index (BMI) of 31.0 to 31.9 in adult 02/28/2023   Scrotal sebaceous cyst 11/09/2022   Tendinitis of flexor tendon of both hands 07/19/2022   Hemorrhoids 02/25/2021   Chronic pain of right knee 01/11/2021   Bipolar 1 disorder (HCC) 10/18/2018   Tobacco use disorder 08/20/2017   GAD (generalized anxiety  disorder) 09/25/2016    ONSET DATE: 04/07/2024 referral date 02/15/24 surgery date   REFERRING DIAG: S63.091A (ICD-10-CM) - Subluxation of extensor carpi ulnaris tendon, right, initial encounter  THERAPY DIAG:  Other lack of coordination Muscle weakness (generalized) Other disturbances of skin sensation Stiffness of right wrist, not elsewhere classified Pain in right wrist  Rationale for Evaluation and Treatment: Rehabilitation  SUBJECTIVE:   SUBJECTIVE STATEMENT: Pt reports no pain in hand, stating It's definitely not like it was before. Reports successful completion of scar tissue massage at home and reported being able to take  apart a medical bed over the weekend, stated It only hurt a little bit. Pt accompanied by: self  PERTINENT HISTORY: S/p 02/15/24 R wrist ECU reconstruction. Had eval on 02/28/24 with [facility] who fabricated R [place] orthosis to immobilize wrist and forearm.  (REFER TO [place]  HAND PROTOCOL VOLUME I PGS. [id number])   PRECAUTIONS: None  RED FLAGS: None   WEIGHT BEARING RESTRICTIONS: No  PAIN:  Are you having pain? No  FALLS: Has patient fallen in last 6 months? 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 Erwin  OBJECTIVE:  Note: Objective measures were completed at Evaluation unless otherwise noted.  HAND DOMINANCE: Right  ADLs: WFL  FUNCTIONAL OUTCOME MEASURES: Upper Extremity Functional Scale (UEFS): 54/80, currently functioning at 67.5% of full potential  UPPER EXTREMITY ROM:     Active ROM Right eval Left eval  Shoulder flexion    Shoulder abduction    Shoulder adduction    Shoulder extension    Shoulder internal rotation    Shoulder external rotation    Elbow flexion    Elbow extension    Wrist flexion  10  Wrist extension  15  Wrist ulnar deviation  5  Wrist radial deviation  10  Wrist pronation    Wrist supination    (Blank rows = not tested)  Active ROM Right eval Left eval  Thumb MCP (0-60)    Thumb IP (0-80)    Thumb Radial abd/add (0-55)     Thumb Palmar abd/add (0-45)     Thumb Opposition to Small Finger     Index MCP (0-90)     Index PIP (0-100)     Index DIP (0-70)      Long MCP (0-90)      Long PIP (0-100)      Long DIP (0-70)      Ring MCP (0-90)      Ring PIP (0-100)      Ring DIP (0-70)      Little MCP (0-90)       Little PIP (0-100)      Little DIP (0-70)      (Blank rows = not tested)   UPPER EXTREMITY MMT:     MMT Right eval Left eval  Shoulder flexion    Shoulder abduction    Shoulder adduction    Shoulder extension    Shoulder internal rotation    Shoulder external rotation    Middle trapezius    Lower trapezius    Elbow flexion    Elbow extension    Wrist flexion    Wrist extension    Wrist ulnar deviation  Wrist radial deviation    Wrist pronation    Wrist supination    (Blank rows = not tested)  HAND FUNCTION: Grip strength: Right: 33 lbs; Left: 100 lbs  COORDINATION: 9 Hole Peg test: Right: 28.25 sec; Left: 26.41 sec  SENSATION: Some numbness and tingling in R wrist   EDEMA: none  COGNITION: Overall cognitive status: Within functional limits for tasks assessed Areas of impairment: none  OBSERVATIONS: limited ROM in wrist, impaired grip strength, decrease in overall function   TREATMENT DATE: 11/325  Ultrasound applied to scar area on R ECU area, 3.3 Mhz, 20% duty cycle, 1.0 w/cm2 for 8 minutes.   Re-assessed UEFS and ROM and grip strength, see goals for updated measurements. Educated pt in progress he has made thus far. Pt reports successful completion of ROM, scar massage, and strengthening at home.   Next engaged in passive and active wrist ROM focusing on wrist extension. Utilized wrist ROM tool engaging in functional wrist ROM to bring ring across piping. Finally completed wrist prayer stretches and utilized 3# dumbbell and bolster to completed 2x10 wrist flexion and extension.   PATIENT EDUCATION: Education details: SEE ABOVE Person educated: Patient Education method: Explanation, Demonstration, and Handouts Education comprehension: verbalized understanding and returned demonstration  HOME EXERCISE PROGRAM: 05/01/24: scar massage, wrist PROM (ACCESS CODE: [id number]) 05/05/24: Theraputty HEP with red putty ([id number])  05/13/24: Shoulder isometrics  (per pt request): (ACCESS CODE: [id number]) 05/15/24: Elbow/forearm isometrics ([id number]) 05/20/24: Wrist flexion/extension isometrics ( [id number])     GOALS: Goals reviewed with patient? Yes  SHORT TERM GOALS: Target date: 06/12/24  Pt will be independent with scar massage and prescribed HEP for ROM and strengthening of R hand Baseline: Initiated this date Goal status: IN PROGRESS  2.  Pt will only wear [place] orthosis for comfort and activities involving >5 lbs resistance Baseline: Pt new to OP OT, wears constantly especially for driving  Goal status: GOAL MET  3.  Pt will increase grip strength by at least 10 lbs in R hand Baseline: 33 lbs 05/13/24: 65.0 lbs Goal status: GOAL MET  4.  Pt will increase R wrist flexion and extension AROM by at least 15 degrees Baseline: Flexion 10 degrees, 15 degrees extension 05/13/24: 45 degrees flexion, 19 degrees extension  05/15/24: 50 degrees flexion, 45 degrees extension Goal status: GOAL MET    LONG TERM GOALS: Target date: 06/12/24  Pt will increase wrist flexion AROM by at least 50 degrees Baseline: 10 degrees  05/13/24: 45 degrees 05/15/24: 50 degrees 05/26/24: 65 degrees  Goal status: GOAL MET  2.  Pt will increase wrist extension AROM by at least 45 degrees Baseline: 15 degrees  05/13/24: 19 degrees 05/15/24: 45 degrees 05/26/24: 45 degrees Goal status: IN PROGRESS  3.  Pt will increase ulnar deviation ROM of R wrist by at least 20 degrees Baseline: 5 degrees  05/13/24: 9 degrees 05/15/24: 20 degrees 05/26/24: 30 degrees Goal status: GOAL MET  4.  Pt will increase UEFS index score to at least 70/80, indicating increase of function by 20% Baseline: 54/80  05/26/24: 59/80  Goal status: IN PROGRESS  5.  Pt will increase R grip strength by at least 40 pounds  Baseline: 33 lbs 05/13/24: 65.0 lbs 05/26/24: 70.5 lbs Goal status: IN PROGRESS    ASSESSMENT:  CLINICAL IMPRESSION: Patient is a 33 y.o. male who was  seen today for occupational therapy tx for 02/15/24 R wrist ECU reconstruction d/t subluxation. Hx includes ECU tendon sheath reconstruction, bipolar disorder I,  anxiety, B wrist tendonitis. Pt participating well with HEP and stretches, reports successful completion of scar tissue massage at home. Noted improvements in ROM measurements, UEFS, and grip strength this date, see goals for updated measurements. Pt at this time would benefit from continued skilled services to improve strength and ROM in dominant R hand to carry over with work tasks and ADL/IADL.  PERFORMANCE DEFICITS: in functional skills including ADLs, IADLs, ROM, strength, pain, skin integrity, and UE functional use.  IMPAIRMENTS: are limiting patient from ADLs, IADLs, and work.   COMORBIDITIES: may have co-morbidities  that affects occupational performance. Patient will benefit from skilled OT to address above impairments and improve overall function.  MODIFICATION OR ASSISTANCE TO COMPLETE EVALUATION: Min-Moderate modification of tasks or assist with assess necessary to complete an evaluation.  OT OCCUPATIONAL PROFILE AND HISTORY: Detailed assessment: Review of records and additional review of physical, cognitive, psychosocial history related to current functional performance.  CLINICAL DECISION MAKING: Moderate - several treatment options, min-mod task modification necessary  REHAB POTENTIAL: Good  EVALUATION COMPLEXITY: Moderate      PLAN:  OT FREQUENCY: 2x/week  OT DURATION: 6 weeks  PLANNED INTERVENTIONS: 97168 OT Re-evaluation, 97535 self care/ADL training, 02889 therapeutic exercise, 97530 therapeutic activity, 97035 ultrasound, 97018 paraffin, 02960 fluidotherapy, 97010 moist heat, 97010 cryotherapy, 97034 contrast bath, 97760 Orthotic Initial, 97763 Orthotic/Prosthetic subsequent, scar mobilization, passive range of motion, coping strategies training, patient/family education, and DME and/or AE  instructions  RECOMMENDED OTHER SERVICES: none at this time  CONSULTED AND AGREED WITH PLAN OF CARE: Patient  PLAN FOR NEXT SESSION: Fluidotherapy or ultrasound Wrist strengthening and ROM activities Hand strengthening activities   [employer], OT 05/26/2024, 9:33 AM

## 2024-05-28 ENCOUNTER — Ambulatory Visit: Payer: MEDICAID | Admitting: Urology

## 2024-05-28 VITALS — BP 119/81 | HR 96

## 2024-05-28 DIAGNOSIS — Z87438 Personal history of other diseases of male genital organs: Secondary | ICD-10-CM

## 2024-05-28 DIAGNOSIS — Z09 Encounter for follow-up examination after completed treatment for conditions other than malignant neoplasm: Secondary | ICD-10-CM

## 2024-05-28 DIAGNOSIS — N4889 Other specified disorders of penis: Secondary | ICD-10-CM

## 2024-05-28 DIAGNOSIS — M778 Other enthesopathies, not elsewhere classified: Secondary | ICD-10-CM

## 2024-05-28 MED ORDER — MELOXICAM 15 MG PO TABS: 15.0000 mg | ORAL_TABLET | Freq: Every day | ORAL | 3 refills | Status: DC

## 2024-05-28 NOTE — Progress Notes (Signed)
 05/28/2024 9:32 AM   Clinton Fisher 09/10/1990 [id number]  Referring provider: Knute Thersia Bitters, FNP [address] [place],  [place] [address]  Penile cyst   HPI: Clinton Fisher is a 33yo here for followup for a penile cyst and new left testis pain. He underwent penile cyst excision 1 year ago and then developed an abscess that spontaneously drained. The area is now healed. For the past 3-4 months he has noted to have left testis pain. He underwent Scrotal US  05/16/24 which showed small bilateral epididymal cysts.    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   Manic affective disorder with recurrent episode (HCC)    Seasonal allergies    sore throat 07/29/2013   Skin problem 12/07/2021    Surgical History: Past Surgical History:  Procedure Laterality Date   CYST EXCISION PERINEAL N/A 07/12/2023   Procedure: SCROTUM EXPLORATION-excision of penile sebaceous cyst;  Surgeon: Sherrilee Belvie CROME, MD;  Location: [facility];  Service: Urology;  Laterality: N/A;   FOOT FOREIGN BODY REMOVAL Right 01/19/2011   KNEE ARTHROSCOPY Right 08/01/2013   Procedure: ARTHROSCOPY RIGHT KNEE, Chondroplasty, Excision of Plica;  Surgeon: Norleen CROME Gavel, MD;  Location: [facility];  Service: Orthopedics;  Laterality: Right;   REPAIR EXTENSOR TENDON Right 02/15/2024   Procedure: REPAIR, TENDON, EXTENSOR;  Surgeon: Arlinda Buster, MD;  Location: [facility];  Service: Orthopedics;  Laterality: Right;  RIGHT WRIST EXTENSOR CARPI ULNARIS SUBSHEATH RECONSTRUCTION   SCROTAL EXPLORATION N/A 11/09/2022   Procedure: SCROTUM EXPLORATION-excision of scrotal sebaceous cyst;  Surgeon: Sherrilee Belvie CROME, MD;  Location: [facility];  Service: Urology;  Laterality: N/A;    Home Medications:   Allergies as of 05/28/2024       Reactions   Trazodone And Nefazodone Other (See Comments)   priapism   Amoxicillin Diarrhea   Amoxicillin-pot Clavulanate Diarrhea   Diarrhea   Clavulanic Acid Other (See Comments)   Unknown        Medication List        Accurate as of May 28, 2024  9:32 AM. If you have any questions, ask your nurse or doctor.          acetaminophen  325 MG tablet Commonly known as: TYLENOL  Take 650 mg by mouth every 6 (six) hours as needed for mild pain (pain score 1-3) or moderate pain (pain score 4-6).   doxycycline  100 MG tablet Commonly known as: VIBRA -TABS Take 1 tablet (100 mg total) by mouth 2 (two) times daily.   meloxicam  15 MG tablet Commonly known as: MOBIC  Take 1 tablet (15 mg total) by mouth daily.   pseudoephedrine 30 MG tablet Commonly known as: SUDAFED Take 30 mg by mouth every 4 (four) hours as needed for congestion.        Allergies:  Allergies  Allergen Reactions   Trazodone And Nefazodone Other (See Comments)    priapism   Amoxicillin Diarrhea   Amoxicillin-Pot Clavulanate Diarrhea    Diarrhea    Clavulanic Acid Other (See Comments)    Unknown    Family History: Family History  Problem Relation Age of Onset   Depression Mother    Colon polyps Mother     Social History:  reports that he has been smoking e-cigarettes. He  has been exposed to tobacco smoke. He has quit using smokeless tobacco. He reports current alcohol use. He reports that he does not use drugs.  ROS: All other review of systems were reviewed and are negative except what is noted above in HPI  Physical Exam: BP 119/81   Pulse 96   Constitutional:  Alert and oriented, No acute distress. HEENT: [place] AT, moist mucus membranes.  Trachea midline, no masses. Cardiovascular: No clubbing, cyanosis, or edema. Respiratory: Normal respiratory effort, no increased work of breathing. GI: Abdomen is soft, nontender, nondistended, no abdominal  masses GU: No CVA tenderness.  Lymph: No cervical or inguinal lymphadenopathy. Skin: No rashes, bruises or suspicious lesions. Neurologic: Grossly intact, no focal deficits, moving all 4 extremities. Psychiatric: Normal mood and affect.  Laboratory Data: Lab Results  Component Value Date   WBC 8.1 02/29/2024   HGB 16.4 02/29/2024   HCT 49.5 02/29/2024   MCV 90 02/29/2024   PLT 330 02/29/2024    Lab Results  Component Value Date   CREATININE 0.90 02/29/2024    No results found for: PSA  No results found for: TESTOSTERONE  Lab Results  Component Value Date   HGBA1C 5.5 02/21/2023    Urinalysis    Component Value Date/Time   COLORURINE YELLOW 05/16/2024 1018   APPEARANCEUR CLEAR 05/16/2024 1018   APPEARANCEUR Clear 09/22/2022 0948   LABSPEC 1.032 (H) 05/16/2024 1018   PHURINE 5.5 05/16/2024 1018   GLUCOSEU NEGATIVE 05/16/2024 1018   HGBUR TRACE (A) 05/16/2024 1018   BILIRUBINUR NEGATIVE 05/16/2024 1018   BILIRUBINUR Negative 09/22/2022 0948   KETONESUR NEGATIVE 05/16/2024 1018   PROTEINUR 30 (A) 05/16/2024 1018   UROBILINOGEN 0.2 10/15/2012 2158   NITRITE NEGATIVE 05/16/2024 1018   LEUKOCYTESUR NEGATIVE 05/16/2024 1018    Lab Results  Component Value Date   LABMICR See below: 09/22/2022   WBCUA 0-5 09/22/2022   LABEPIT 0-10 09/22/2022   BACTERIA NONE SEEN 05/16/2024    Pertinent Imaging:  No results found for this or any previous visit.  No results found for this or any previous visit.  No results found for this or any previous visit.  No results found for this or any previous visit.  No results found for this or any previous visit.  No results found for this or any previous visit.  No results found for this or any previous visit.  No results found for this or any previous visit.   Assessment & Plan:    1. Penile cyst (Primary) resolved   No follow-ups on file.  Belvie Clara, MD  [facility] [place]

## 2024-05-29 ENCOUNTER — Encounter (HOSPITAL_BASED_OUTPATIENT_CLINIC_OR_DEPARTMENT_OTHER): Payer: Self-pay | Admitting: Emergency Medicine

## 2024-05-29 ENCOUNTER — Emergency Department (HOSPITAL_BASED_OUTPATIENT_CLINIC_OR_DEPARTMENT_OTHER)
Admission: EM | Admit: 2024-05-29 | Discharge: 2024-05-29 | Disposition: A | Discharge status: Home / Self Care | Payer: MEDICAID | Source: Ambulatory Visit

## 2024-05-29 ENCOUNTER — Ambulatory Visit: Payer: MEDICAID

## 2024-05-29 ENCOUNTER — Other Ambulatory Visit: Payer: Self-pay

## 2024-05-29 DIAGNOSIS — M25531 Pain in right wrist: Secondary | ICD-10-CM

## 2024-05-29 DIAGNOSIS — L723 Sebaceous cyst: Secondary | ICD-10-CM | POA: Diagnosis present

## 2024-05-29 DIAGNOSIS — R5383 Other fatigue: Secondary | ICD-10-CM | POA: Insufficient documentation

## 2024-05-29 DIAGNOSIS — Z79899 Other long term (current) drug therapy: Secondary | ICD-10-CM | POA: Diagnosis not present

## 2024-05-29 DIAGNOSIS — M6281 Muscle weakness (generalized): Secondary | ICD-10-CM

## 2024-05-29 DIAGNOSIS — R278 Other lack of coordination: Secondary | ICD-10-CM

## 2024-05-29 DIAGNOSIS — R519 Headache, unspecified: Secondary | ICD-10-CM | POA: Insufficient documentation

## 2024-05-29 DIAGNOSIS — R208 Other disturbances of skin sensation: Secondary | ICD-10-CM

## 2024-05-29 DIAGNOSIS — M25631 Stiffness of right wrist, not elsewhere classified: Secondary | ICD-10-CM

## 2024-05-29 NOTE — Discharge Instructions (Signed)
 It was a pleasure meeting with you today.  I have included a list of several local dermatology offices with their contact information.  Hopefully will be able to find an office that has an appointment sooner than June.  Please return to see us  if the area comes more swollen, painful, or if you are concerned it has become infected. Follow-up with your primary care physician for further evaluation of increased frequency headaches.  If these headaches persist or worsen please return to see us .  If you develop nausea, vomiting, dizziness, weakness, blurred vision, facial droop, confusion, chest pain or shortness of breath seek further evaluation at your closest emergency department.

## 2024-05-29 NOTE — Therapy (Signed)
 OUTPATIENT OCCUPATIONAL THERAPY ORTHO TREATMENT  Patient Name: Clinton Fisher MRN: [id number] DOB:Mar 28, 1991, 33 y.o., male Today's Date: 05/29/2024  PCP: Knute Thersia Bitters, NP REFERRING PROVIDER: Arlinda Buster, MD  END OF SESSION:  OT End of Session - 05/29/24 0852     Visit Number 7    Number of Visits 12    Date for Recertification  06/12/24    Authorization Type [facility]    Authorization - Visit Number 7    Authorization - Number of Visits 12    OT Start Time [phone]    OT Stop Time 0927    OT Time Calculation (min) 44 min    Equipment Utilized During Treatment fluido    Activity Tolerance Patient tolerated treatment well    Behavior During Therapy [facility] for tasks assessed/performed             Past Medical History:  Diagnosis Date   Abrasion of index finger 07/29/2013   left   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   Manic affective disorder with recurrent episode (HCC)    Seasonal allergies    sore throat 07/29/2013   Skin problem 12/07/2021   Past Surgical History:  Procedure Laterality Date   CYST EXCISION PERINEAL N/A 07/12/2023   Procedure: SCROTUM EXPLORATION-excision of penile sebaceous cyst;  Surgeon: Sherrilee Belvie CROME, MD;  Location: [facility];  Service: Urology;  Laterality: N/A;   FOOT FOREIGN BODY REMOVAL Right 01/19/2011   KNEE ARTHROSCOPY Right 08/01/2013   Procedure: ARTHROSCOPY RIGHT KNEE, Chondroplasty, Excision of Plica;  Surgeon: Norleen CROME Gavel, MD;  Location: [facility];  Service: Orthopedics;  Laterality: Right;   REPAIR EXTENSOR TENDON Right 02/15/2024   Procedure: REPAIR, TENDON, EXTENSOR;  Surgeon: Arlinda Buster, MD;  Location: [facility];  Service: Orthopedics;  Laterality: Right;  RIGHT WRIST EXTENSOR CARPI ULNARIS SUBSHEATH RECONSTRUCTION   SCROTAL EXPLORATION N/A 11/09/2022   Procedure: SCROTUM  EXPLORATION-excision of scrotal sebaceous cyst;  Surgeon: Sherrilee Belvie CROME, MD;  Location: [facility];  Service: Urology;  Laterality: N/A;   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 02/15/2024   Penile cyst 07/12/2023   Class 1 obesity due to excess calories without serious comorbidity with body mass index (BMI) of 31.0 to 31.9 in adult 02/28/2023   Scrotal sebaceous cyst 11/09/2022   Tendinitis of flexor tendon of both hands 07/19/2022   Hemorrhoids 02/25/2021   Chronic pain of right knee 01/11/2021   Bipolar 1 disorder (HCC) 10/18/2018   Tobacco use disorder 08/20/2017   GAD (generalized anxiety disorder) 09/25/2016    ONSET DATE: 04/07/2024 referral date 02/15/24 surgery date   REFERRING DIAG: D36.908J (ICD-10-CM) - Subluxation of extensor carpi ulnaris tendon, right, initial encounter  THERAPY DIAG:  1. Other lack of coordination (Primary)   2. Pain in right wrist   3. Stiffness of right wrist, not elsewhere classified   4. Muscle weakness (generalized)   5. Other disturbances of skin sensation       Rationale for Evaluation and Treatment: Rehabilitation  SUBJECTIVE:   SUBJECTIVE STATEMENT: Pt reports waking up with a lot less pain in hand than usual. Pt accompanied by: self  PERTINENT HISTORY: S/p 02/15/24 R wrist ECU reconstruction. Had eval on 02/28/24 with  [facility] who fabricated R [place] orthosis to immobilize wrist and forearm.  (REFER TO [place]  HAND PROTOCOL VOLUME I PGS. 182-183)   PRECAUTIONS: None  RED FLAGS: None   WEIGHT BEARING RESTRICTIONS: No  PAIN:  Are you having pain? No  FALLS: Has patient fallen in last 6 months? 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 Erwin  OBJECTIVE:  Note: Objective measures were completed at Evaluation unless otherwise noted.  HAND DOMINANCE: Right  ADLs: WFL  FUNCTIONAL OUTCOME MEASURES: Upper Extremity Functional Scale (UEFS): 54/80, currently functioning at 67.5% of full potential  UPPER EXTREMITY ROM:     Active ROM Right eval Left eval  Shoulder flexion    Shoulder abduction    Shoulder adduction    Shoulder extension    Shoulder internal rotation    Shoulder external rotation    Elbow flexion    Elbow extension    Wrist flexion  10  Wrist extension  15  Wrist ulnar deviation  5  Wrist radial deviation  10  Wrist pronation    Wrist supination    (Blank rows = not tested)  Active ROM Right eval Left eval  Thumb MCP (0-60)    Thumb IP (0-80)    Thumb Radial abd/add (0-55)     Thumb Palmar abd/add (0-45)     Thumb Opposition to Small Finger     Index MCP (0-90)     Index PIP (0-100)     Index DIP (0-70)      Long MCP (0-90)      Long PIP (0-100)      Long DIP (0-70)      Ring MCP (0-90)      Ring PIP (0-100)      Ring DIP (0-70)      Little MCP (0-90)      Little PIP (0-100)      Little DIP (0-70)      (Blank rows = not tested)   UPPER EXTREMITY MMT:     MMT Right eval Left eval  Shoulder flexion    Shoulder abduction    Shoulder adduction    Shoulder extension    Shoulder internal rotation    Shoulder external rotation    Middle trapezius    Lower trapezius    Elbow flexion    Elbow extension    Wrist flexion    Wrist extension    Wrist ulnar deviation    Wrist radial deviation    Wrist pronation    Wrist supination    (Blank rows = not tested)  HAND FUNCTION: Grip strength: Right: 33 lbs; Left: 100 lbs  COORDINATION: 9 Hole Peg test: Right: 28.25 sec; Left: 26.41  sec  SENSATION: Some numbness and tingling in R wrist   EDEMA: none  COGNITION: Overall cognitive status: Within functional limits for tasks assessed Areas of impairment: none  OBSERVATIONS: limited ROM in wrist, impaired grip strength, decrease in overall function   TREATMENT DATE: 05/29/24   Pt placed RUE in Fluidotherapy machine with supervised ROM x 8 min. Pt was educated to complete tendon glides during modality time to improve ROM and decrease pain/stiffness of affected extremity by use of the  machine's massaging action and thermal properties.    Participated in therapeutic activity hammering nails into green putty to promote adjustment to vibrating sensations in R hand. Pt reported no pain with this. Pt educated in anti vibration gloves to assist in reducing strain on wrists during yard/work tasks.   With 2# dumbbell completed 1x10 pronation/supination with wrist. Graded up to 3# weight and completed 1x10 wrist flexion with forearm supinated, 1x10 wrist extension with forearm pronated, then 1x10 wrist radial/ulnar deviation.   Finally, participated in therapeutic activity to improve grip strength. At level 3 resistance with silver coil pt removed purdue pegs from pegboard, graded up to removing smaller pegs to promote further working of extrinsic and intrinsic hand muscles. Finished with 10 prayer stretches and wrist flexion/extension PROM.     PATIENT EDUCATION: Education details: SEE ABOVE Person educated: Patient Education method: Explanation, Demonstration, and Handouts Education comprehension: verbalized understanding and returned demonstration  HOME EXERCISE PROGRAM: 05/01/24: scar massage, wrist PROM (ACCESS CODE: [id number]) 05/05/24: Theraputty HEP with red putty ([id number])  05/13/24: Shoulder isometrics (per pt request): (ACCESS CODE: [id number]) 05/15/24: Elbow/forearm isometrics ([id number]) 05/20/24: Wrist flexion/extension isometrics (  [id number])     GOALS: Goals reviewed with patient? Yes  SHORT TERM GOALS: Target date: 06/12/24  Pt will be independent with scar massage and prescribed HEP for ROM and strengthening of R hand Baseline: Initiated this date Goal status: IN PROGRESS  2.  Pt will only wear [place] orthosis for comfort and activities involving >5 lbs resistance Baseline: Pt new to OP OT, wears constantly especially for driving  Goal status: GOAL MET  3.  Pt will increase grip strength by at least 10 lbs in R hand Baseline: 33 lbs 05/13/24: 65.0 lbs Goal status: GOAL MET  4.  Pt will increase R wrist flexion and extension AROM by at least 15 degrees Baseline: Flexion 10 degrees, 15 degrees extension 05/13/24: 45 degrees flexion, 19 degrees extension  05/15/24: 50 degrees flexion, 45 degrees extension Goal status: GOAL MET    LONG TERM GOALS: Target date: 06/12/24  Pt will increase wrist flexion AROM by at least 50 degrees Baseline: 10 degrees  05/13/24: 45 degrees 05/15/24: 50 degrees 05/26/24: 65 degrees  Goal status: GOAL MET  2.  Pt will increase wrist extension AROM by at least 45 degrees Baseline: 15 degrees  05/13/24: 19 degrees 05/15/24: 45 degrees 05/26/24: 45 degrees Goal status: IN PROGRESS  3.  Pt will increase ulnar deviation ROM of R wrist by at least 20 degrees Baseline: 5 degrees  05/13/24: 9 degrees 05/15/24: 20 degrees 05/26/24: 30 degrees Goal status: GOAL MET  4.  Pt will increase UEFS index score to at least 70/80, indicating increase of function by 20% Baseline: 54/80  05/26/24: 59/80  Goal status: IN PROGRESS  5.  Pt will increase R grip strength by at least 40 pounds  Baseline: 33 lbs 05/13/24: 65.0 lbs 05/26/24: 70.5 lbs Goal status: IN PROGRESS    ASSESSMENT:  CLINICAL IMPRESSION: Patient is a 33 y.o. male who was seen today for occupational therapy tx for 02/15/24 R wrist ECU reconstruction d/t subluxation. Hx includes ECU tendon sheath  reconstruction, bipolar disorder I, anxiety, B wrist tendonitis. Pt participating well with stretches and therapeutic activities, receptive to education. Pt at this time would benefit from continued skilled services to improve strength and ROM in dominant R hand to carry over with work tasks and ADL/IADL.  PERFORMANCE DEFICITS: in functional skills including ADLs, IADLs, ROM, strength, pain, skin integrity, and  UE functional use.  IMPAIRMENTS: are limiting patient from ADLs, IADLs, and work.   COMORBIDITIES: may have co-morbidities  that affects occupational performance. Patient will benefit from skilled OT to address above impairments and improve overall function.  MODIFICATION OR ASSISTANCE TO COMPLETE EVALUATION: Min-Moderate modification of tasks or assist with assess necessary to complete an evaluation.  OT OCCUPATIONAL PROFILE AND HISTORY: Detailed assessment: Review of records and additional review of physical, cognitive, psychosocial history related to current functional performance.  CLINICAL DECISION MAKING: Moderate - several treatment options, min-mod task modification necessary  REHAB POTENTIAL: Good  EVALUATION COMPLEXITY: Moderate      PLAN:  OT FREQUENCY: 2x/week  OT DURATION: 6 weeks  PLANNED INTERVENTIONS: 97168 OT Re-evaluation, 97535 self care/ADL training, 02889 therapeutic exercise, 97530 therapeutic activity, 97035 ultrasound, 97018 paraffin, 02960 fluidotherapy, 97010 moist heat, 97010 cryotherapy, 97034 contrast bath, 97760 Orthotic Initial, 97763 Orthotic/Prosthetic subsequent, scar mobilization, passive range of motion, coping strategies training, patient/family education, and DME and/or AE instructions  RECOMMENDED OTHER SERVICES: none at this time  CONSULTED AND AGREED WITH PLAN OF CARE: Patient  PLAN FOR NEXT SESSION: Fluidotherapy or ultrasound Wrist strengthening and ROM activities Hand strengthening activities   [employer], OT 05/29/2024,  9:29 AM

## 2024-05-29 NOTE — ED Triage Notes (Signed)
 Pt caox4 ambulatory NAD reporting he was seem 10/24 for cyst on R thigh, recently finished doxy, states he is now fatigued with consistent headaches.

## 2024-05-29 NOTE — ED Provider Notes (Signed)
 [place] EMERGENCY DEPARTMENT AT [facility] Provider Note   CSN: [id number] Arrival date & time: 05/29/24  9046     Patient presents with: Cyst   Clinton Fisher is a 33 y.o. male.   Patient is here for evaluation of known cyst to the upper right thigh.  He states this has been present for several years and flares up occasionally.  He reports about 2 weeks ago it swelled to the size of a golf ball and is now the size of a marble and hard in texture.  He denies any pain associated with the area other than a rare throbbing that reminds him it is there.  He denies any drainage or bleeding from this area.  Denies numbness, tingling, change in gait, or falls.  He has not tried to drain this area.  He is concerned as he spoke with his primary care physician about having this removed and the soonest available appointment with a dermatologist was for June 2026.  He states he does not want to live with this until June and would prefer to have it removed before then.  He is not interested in having it drained here today, as he knows it would just fill back up with fluid as has happened with other cysts he has had in the past.  He knows that he needs to have the cyst fully removed versus drained repeatedly.    He reports a history of occasional headaches that respond well to Tylenol , however he has had more frequent headaches over the last 2 weeks.  He has used Tylenol , increased hydration, sleeps about 8 hours per night with minimal improvement of these headaches. He denies any nausea, vomiting, dizziness, blurred vision, nosebleeds, shortness of breath or chest pain associated with these headaches.  He denies any recent head injury or falls.  He does admit a history of migraines.  And states that these recent headaches are often worsened by sound and bright lights.  Usually he has to lie down in a dark room and sleep for a bit to improve the headaches.  He is not on any migraine specific  medications at this time.  He reports more doctors appointments over the last 2 weeks than usual and has been attending physical therapy which he admits has taken a lot out of him.  He is also frustrated with the extended time he will take to get in with a dermatologist. patient wears glasses.  He last had an eye appointment over 1 year ago and reports being due for another eye appointment.  He is not sure if he has been straining his eyes.  He denies change in vision. He drinks about 2-3 cups of coffee a week and has not changed his caffeine intake in the last two weeks.  The history is provided by the patient.       Prior to Admission medications   Medication Sig Start Date End Date Taking? Authorizing Provider  acetaminophen  (TYLENOL ) 325 MG tablet Take 650 mg by mouth every 6 (six) hours as needed for mild pain (pain score 1-3) or moderate pain (pain score 4-6). Patient not taking: Reported on 05/28/2024    [provider]  doxycycline  (VIBRA -TABS) 100 MG tablet Take 1 tablet (100 mg total) by mouth 2 (two) times daily. Patient not taking: Reported on 05/28/2024 05/14/24   Caudle, Alexis Olivia, FNP  meloxicam  (MOBIC ) 15 MG tablet Take 1 tablet (15 mg total) by mouth daily. 05/28/24   McKenzie,  Belvie CROME, MD  pseudoephedrine (SUDAFED) 30 MG tablet Take 30 mg by mouth every 4 (four) hours as needed for congestion. Patient not taking: Reported on 05/28/2024    [provider]    Allergies: Trazodone and nefazodone, Amoxicillin, Amoxicillin-pot clavulanate, and Clavulanic acid    Review of Systems  Constitutional:  Positive for fatigue. Negative for appetite change, chills and fever.  HENT:  Negative for congestion and rhinorrhea.   Eyes:  Negative for visual disturbance.  Respiratory:  Negative for cough and shortness of breath.   Cardiovascular:  Negative for chest pain and palpitations.  Gastrointestinal:  Negative for abdominal pain, blood in stool, constipation,  diarrhea, nausea and vomiting.  Genitourinary:  Negative for dysuria, frequency and hematuria.  Skin:  Negative for wound.       Swollen lump to right upper, inner thigh without pain or redness.  Neurological:  Positive for headaches. Negative for dizziness, syncope, facial asymmetry, speech difficulty, weakness, light-headedness and numbness.  Psychiatric/Behavioral:  Negative for confusion.     Updated Vital Signs BP (!) 143/93 (BP Location: Right Arm)   Pulse 86   Temp 98.1 F (36.7 C) (Oral)   Resp 17   Ht 5' 9 (1.753 m)   Wt 97.5 kg   SpO2 98%   BMI 31.75 kg/m   Physical Exam Vitals and nursing note reviewed.  Constitutional:      General: He is not in acute distress.    Appearance: Normal appearance. He is not ill-appearing.  HENT:     Head: Normocephalic and atraumatic.  Eyes:     Extraocular Movements: Extraocular movements intact.  Cardiovascular:     Rate and Rhythm: Normal rate and regular rhythm.     Pulses:          Dorsalis pedis pulses are 2+ on the left side.     Heart sounds: Normal heart sounds.  Pulmonary:     Effort: Pulmonary effort is normal. No respiratory distress.     Breath sounds: Normal breath sounds.  Abdominal:     General: Abdomen is flat. Bowel sounds are normal. There is no distension.     Palpations: Abdomen is soft.     Tenderness: There is no abdominal tenderness. There is no guarding.  Musculoskeletal:     Right lower leg: No edema.     Left lower leg: No edema.  Feet:     Left foot:     Skin integrity: Skin integrity normal.  Skin:    General: Skin is warm and dry.     Coloration: Skin is not jaundiced or pale.     Findings: No abscess, bruising, ecchymosis, erythema, signs of injury, laceration, rash or wound.         Comments: Well-circumscribed firm area to the inner, upper right thigh. No tenderness, redness, or warmth at the area. No opening, drainage, or bleeding. Mobile. Distal pulses, sensory, motor intact.   Neurological:     Mental Status: He is alert and oriented to person, place, and time.     (all labs ordered are listed, but only abnormal results are displayed) Labs Reviewed - No data to display  EKG: None  Radiology: No results found.   Procedures   Medications Ordered in the ED - No data to display    Patient presents to the ED for concern of swollen area and headaches, this involves an extensive number of treatment options, and is a complaint that carries with it a high risk of  complications and morbidity.  The differential diagnosis includes sebaceous cyst, abscess, lipoma, hypertension induced headache, migraine, dehydration, caffeine induced headache, stress related headache, eyestrain related headache, or hemorrhagic stroke.   Additional history obtained:  Additional history obtained from previous ED visits and doctor's appointments   External records from outside source obtained and reviewed including prior cyst treatments and assessments.   Problem List / ED Course:  Cyst to upper right thigh.  After speaking with the patient at length, he is not interested in having this area drained in the ED today.  He voices frustration over the soonest appointment for a dermatologist not being until June 2026.  He is interested in receiving a list of local dermatologists in hopes of finding a sooner appointment. Headaches.  He plans to speak with his primary care physician about these headaches.   Reevaluation:  After the interventions noted above, I reevaluated the patient and found that they have :stable   Dispostion:  After consideration of the diagnostic results and the patients response to treatment, I feel that the patent would benefit from continued monitoring of the swollen area in the home setting with follow-up in dermatology for eventual removal.  Follow-up with primary care regarding headache symptoms.  He will return if the symptoms worsen.                                    Medical Decision Making       Final diagnoses:  Sebaceous cyst    ED Discharge Orders     None          Rosina Almarie LABOR, PA-C 05/29/24 1135    Kammerer, Megan L, DO 05/30/24 1303

## 2024-05-30 ENCOUNTER — Telehealth (HOSPITAL_BASED_OUTPATIENT_CLINIC_OR_DEPARTMENT_OTHER): Payer: Self-pay | Admitting: Family Medicine

## 2024-05-30 NOTE — Telephone Encounter (Signed)
 Copied from CRM [id number]. Topic: Clinical - Medical Advice >> May 30, 2024 11:05 AM Ivette P wrote: Reason for CRM: PT called  in about  a cyst in his thigh and was told at ER that he has Abscess. Provider told pt needs dermatology referral but no dermatologist accept medicare.   Pt would like to know if he can go to a careers adviser.    Callback [phone]   Referral to Derm says patient refused appt. [facility] accepts medicare. Please reach out to patient

## 2024-05-31 NOTE — Telephone Encounter (Signed)
 Routing encounter to [place] for further review.

## 2024-05-31 NOTE — Progress Notes (Deleted)
   Clinton Fisher - 33 y.o. male MRN [id number]  Date of birth: Jul 20, 1991  Office Visit Note: Visit Date: 06/02/2024 PCP: Knute Thersia Bitters, FNP Referred by: Knute Thersia Bitters, *  Subjective:  HPI: Clinton Fisher is a 33 y.o. male who presents today for follow up roughly 4 months status post Right wrist extensor carpi ulnar subsheath reconstruction .  Doing well overall, has been doing therapy with OT for range of motion exercises, weaning from the brace.***  Pertinent ROS were reviewed with the patient and found to be negative unless otherwise specified above in HPI.   Assessment & Plan: Visit Diagnoses:  No diagnosis found.   Plan: He is doing well postoperatively.***  Follow-up: No follow-ups on file.   Meds & Orders: No orders of the defined types were placed in this encounter.  No orders of the defined types were placed in this encounter.    Procedures: No procedures performed       Objective:   Vital Signs: There were no vitals taken for this visit.  Ortho Exam Left upper extremity: - Well-healed incision, wrist flexion/extension 15/15, radial/ulnar deviation 10/5 - Composite fist without restriction - Grip strength Jamar 2 right 33, left 100***  Imaging: No results found.   Ricki Vanhandel Afton Alderton, M.D. [facility], Hand Surgery

## 2024-06-02 ENCOUNTER — Encounter: Payer: MEDICAID | Admitting: Orthopedic Surgery

## 2024-06-02 ENCOUNTER — Ambulatory Visit: Payer: MEDICAID

## 2024-06-02 DIAGNOSIS — R278 Other lack of coordination: Secondary | ICD-10-CM

## 2024-06-02 DIAGNOSIS — R208 Other disturbances of skin sensation: Secondary | ICD-10-CM

## 2024-06-02 DIAGNOSIS — M25631 Stiffness of right wrist, not elsewhere classified: Secondary | ICD-10-CM

## 2024-06-02 DIAGNOSIS — M25531 Pain in right wrist: Secondary | ICD-10-CM

## 2024-06-02 DIAGNOSIS — M6281 Muscle weakness (generalized): Secondary | ICD-10-CM

## 2024-06-02 NOTE — Therapy (Signed)
 OUTPATIENT OCCUPATIONAL THERAPY ORTHO TREATMENT  Patient Name: Clinton Fisher MRN: [id number] DOB:Apr 13, 1991, 33 y.o., male Today's Date: 06/02/2024  PCP: Knute Thersia Bitters, NP REFERRING PROVIDER: Arlinda Buster, MD  END OF SESSION:  OT End of Session - 06/02/24 0901     Visit Number 8    Number of Visits 12    Date for Recertification  06/12/24    Authorization Type [facility]    Authorization - Visit Number 8    Authorization - Number of Visits 12    OT Start Time [phone]    OT Stop Time 0925    OT Time Calculation (min) 39 min    Equipment Utilized During Treatment ultrasound    Activity Tolerance Patient tolerated treatment well    Behavior During Therapy [facility] for tasks assessed/performed              Past Medical History:  Diagnosis Date   Abrasion of index finger 07/29/2013   left   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   Manic affective disorder with recurrent episode (HCC)    Seasonal allergies    sore throat 07/29/2013   Skin problem 12/07/2021   Past Surgical History:  Procedure Laterality Date   CYST EXCISION PERINEAL N/A 07/12/2023   Procedure: SCROTUM EXPLORATION-excision of penile sebaceous cyst;  Surgeon: Sherrilee Belvie CROME, MD;  Location: [facility];  Service: Urology;  Laterality: N/A;   FOOT FOREIGN BODY REMOVAL Right 01/19/2011   KNEE ARTHROSCOPY Right 08/01/2013   Procedure: ARTHROSCOPY RIGHT KNEE, Chondroplasty, Excision of Plica;  Surgeon: Norleen CROME Gavel, MD;  Location: [facility];  Service: Orthopedics;  Laterality: Right;   REPAIR EXTENSOR TENDON Right 02/15/2024   Procedure: REPAIR, TENDON, EXTENSOR;  Surgeon: Arlinda Buster, MD;  Location: [facility];  Service: Orthopedics;  Laterality: Right;  RIGHT WRIST EXTENSOR CARPI ULNARIS SUBSHEATH RECONSTRUCTION   SCROTAL EXPLORATION N/A 11/09/2022   Procedure: SCROTUM  EXPLORATION-excision of scrotal sebaceous cyst;  Surgeon: Sherrilee Belvie CROME, MD;  Location: [facility];  Service: Urology;  Laterality: N/A;   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 02/15/2024   Penile cyst 07/12/2023   Class 1 obesity due to excess calories without serious comorbidity with body mass index (BMI) of 31.0 to 31.9 in adult 02/28/2023   Scrotal sebaceous cyst 11/09/2022   Tendinitis of flexor tendon of both hands 07/19/2022   Hemorrhoids 02/25/2021   Chronic pain of right knee 01/11/2021   Bipolar 1 disorder (HCC) 10/18/2018   Tobacco use disorder 08/20/2017   GAD (generalized anxiety disorder) 09/25/2016    ONSET DATE: 04/07/2024 referral date 02/15/24 surgery date   REFERRING DIAG: D36.908J (ICD-10-CM) - Subluxation of extensor carpi ulnaris tendon, right, initial encounter  THERAPY DIAG:  1. Other lack of coordination (Primary)   2. Pain in right wrist   3. Stiffness of right wrist, not elsewhere classified   4. Muscle weakness (generalized)   5. Other disturbances of skin sensation       Rationale for Evaluation and Treatment: Rehabilitation  SUBJECTIVE:   SUBJECTIVE STATEMENT: Pt reports doing some work over the weekend lifting some heavy items. I'm waking up with less pain each day. Pt accompanied by: self  PERTINENT HISTORY: S/p 02/15/24 R  wrist ECU reconstruction. Had eval on 02/28/24 with [facility] who fabricated R [place] orthosis to immobilize wrist and forearm.  (REFER TO [place]  HAND PROTOCOL VOLUME I PGS. 182-183)   PRECAUTIONS: None  RED FLAGS: None   WEIGHT BEARING RESTRICTIONS: No  PAIN:  Are you having pain? No  FALLS: Has patient fallen in last 6 months? 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 Erwin  OBJECTIVE:  Note: Objective measures were completed at Evaluation unless otherwise noted.  HAND DOMINANCE: Right  ADLs: WFL  FUNCTIONAL OUTCOME MEASURES: Upper Extremity Functional Scale (UEFS): 54/80, currently functioning at 67.5% of full potential  UPPER EXTREMITY ROM:     Active ROM Right eval Left eval  Shoulder flexion    Shoulder abduction    Shoulder adduction    Shoulder extension    Shoulder internal rotation    Shoulder external rotation    Elbow flexion    Elbow extension    Wrist flexion  10  Wrist extension  15  Wrist ulnar deviation  5  Wrist radial deviation  10  Wrist pronation    Wrist supination    (Blank rows = not tested)  Active ROM Right eval Left eval  Thumb MCP (0-60)    Thumb IP (0-80)    Thumb Radial abd/add (0-55)     Thumb Palmar abd/add (0-45)     Thumb Opposition to Small Finger     Index MCP (0-90)     Index PIP (0-100)     Index DIP (0-70)      Long MCP (0-90)      Long PIP (0-100)      Long DIP (0-70)      Ring MCP (0-90)      Ring PIP (0-100)      Ring DIP (0-70)      Little MCP (0-90)      Little PIP (0-100)      Little DIP (0-70)      (Blank rows = not tested)   UPPER EXTREMITY MMT:     MMT Right eval Left eval  Shoulder flexion    Shoulder abduction    Shoulder adduction    Shoulder extension    Shoulder internal rotation    Shoulder external rotation    Middle trapezius    Lower trapezius    Elbow flexion    Elbow extension    Wrist flexion    Wrist extension    Wrist ulnar deviation    Wrist radial deviation    Wrist pronation    Wrist supination    (Blank rows = not tested)  HAND FUNCTION: Grip strength: Right: 33 lbs; Left: 100 lbs  COORDINATION: 9 Hole Peg  test: Right: 28.25 sec; Left: 26.41 sec  SENSATION: Some numbness and tingling in R wrist   EDEMA: none  COGNITION: Overall cognitive status: Within functional limits for tasks assessed Areas of impairment: none  OBSERVATIONS: limited ROM in wrist, impaired grip strength, decrease in overall function   TREATMENT DATE: 06/02/24   Ultrasound x 8 minutes, 3.3 Mhz, 1.0 wts/cm2, 20% duty cycle over scar area on R wrist and forearm for reducing pain and massaging scar tissue.  Completed 10 prayer stretches to warm up for wrist strengthening for improved functional use of R hand/wrist. With 4# dumbbell completed 2x10 wrist ulnar/radial deviation, with forearm supinated completed wrist flexion, pronated wrist extension, then completed 2x10 forearm pronation/supination No c/o pain.   Finally, participated in therapeutic activity to improve grip strength. At level 3 resistance with silver coil pt removed purdue pegs from pegboard, graded up to removing smaller pegs to promote further working of extrinsic and intrinsic hand muscles. Finished with 10 prayer stretches and wrist flexion/extension PROM. Donned 4# wrist weights and participated in card game to further strengthen R wrist, pt tolerated well.      PATIENT EDUCATION: Education details: SEE ABOVE Person educated: Patient Education method: Explanation, Demonstration, and Handouts Education comprehension: verbalized understanding and returned demonstration  HOME EXERCISE PROGRAM: 05/01/24: scar massage, wrist PROM (ACCESS CODE: [id number]) 05/05/24: Theraputty HEP with red putty ([id number])  05/13/24: Shoulder isometrics (per pt request): (ACCESS CODE: [id number]) 05/15/24: Elbow/forearm isometrics ([id number]) 05/20/24: Wrist flexion/extension isometrics ( [id number])     GOALS: Goals reviewed with patient? Yes  SHORT TERM GOALS: Target date: 06/12/24  Pt will be independent with scar massage and prescribed HEP for ROM and  strengthening of R hand Baseline: Initiated this date Goal status: IN PROGRESS  2.  Pt will only wear [place] orthosis for comfort and activities involving >5 lbs resistance Baseline: Pt new to OP OT, wears constantly especially for driving  Goal status: GOAL MET  3.  Pt will increase grip strength by at least 10 lbs in R hand Baseline: 33 lbs 05/13/24: 65.0 lbs Goal status: GOAL MET  4.  Pt will increase R wrist flexion and extension AROM by at least 15 degrees Baseline: Flexion 10 degrees, 15 degrees extension 05/13/24: 45 degrees flexion, 19 degrees extension  05/15/24: 50 degrees flexion, 45 degrees extension Goal status: GOAL MET    LONG TERM GOALS: Target date: 06/12/24  Pt will increase wrist flexion AROM by at least 50 degrees Baseline: 10 degrees  05/13/24: 45 degrees 05/15/24: 50 degrees 05/26/24: 65 degrees  Goal status: GOAL MET  2.  Pt will increase wrist extension AROM by at least 45 degrees Baseline: 15 degrees  05/13/24: 19 degrees 05/15/24: 45 degrees 05/26/24: 45 degrees Goal status: IN PROGRESS  3.  Pt will increase ulnar deviation ROM of R wrist by at least 20 degrees Baseline: 5 degrees  05/13/24: 9 degrees 05/15/24: 20 degrees 05/26/24: 30 degrees Goal status: GOAL MET  4.  Pt will increase UEFS index score to at least 70/80, indicating increase of function by 20% Baseline: 54/80  05/26/24: 59/80  Goal status: IN PROGRESS  5.  Pt will increase R grip strength by at least 40 pounds  Baseline: 33 lbs 05/13/24: 65.0 lbs 05/26/24: 70.5 lbs Goal status: IN PROGRESS    ASSESSMENT:  CLINICAL IMPRESSION: Patient is a 33 y.o. male who was seen today for occupational therapy tx for 02/15/24 R wrist ECU reconstruction d/t subluxation. Hx includes ECU tendon sheath reconstruction, bipolar disorder I, anxiety, B wrist tendonitis. Pt participating well with stretches and therapeutic activities as well as strengthening, receptive to education. Pt at this  time would benefit from continued skilled services to improve strength and ROM in dominant R hand to carry over with work tasks and ADL/IADL.  PERFORMANCE DEFICITS: in functional skills including ADLs, IADLs, ROM, strength, pain, skin integrity, and UE functional use.  IMPAIRMENTS: are limiting patient from ADLs, IADLs, and work.   COMORBIDITIES: may have co-morbidities  that affects occupational performance. Patient will benefit from skilled OT to address above impairments and improve overall function.  MODIFICATION OR ASSISTANCE TO COMPLETE EVALUATION: Min-Moderate modification of tasks or assist with assess necessary to complete an evaluation.  OT OCCUPATIONAL PROFILE AND HISTORY: Detailed assessment: Review of records and additional review of physical, cognitive, psychosocial history related to current functional performance.  CLINICAL DECISION MAKING: Moderate - several treatment options, min-mod task modification necessary  REHAB POTENTIAL: Good  EVALUATION COMPLEXITY: Moderate      PLAN:  OT FREQUENCY: 2x/week  OT DURATION: 6 weeks  PLANNED INTERVENTIONS: 97168 OT Re-evaluation, 97535 self care/ADL training, 02889 therapeutic exercise, 97530 therapeutic activity, 97035 ultrasound, 97018 paraffin, 02960 fluidotherapy, 97010 moist heat, 97010 cryotherapy, 97034 contrast bath, 97760 Orthotic Initial, 97763 Orthotic/Prosthetic subsequent, scar mobilization, passive range of motion, coping strategies training, patient/family education, and DME and/or AE instructions  RECOMMENDED OTHER SERVICES: none at this time  CONSULTED AND AGREED WITH PLAN OF CARE: Patient  PLAN FOR NEXT SESSION: Fluidotherapy or ultrasound Wrist strengthening and ROM activities Hand strengthening activities   [employer], OT 06/02/2024, 9:27 AM

## 2024-06-02 NOTE — Telephone Encounter (Signed)
 Mychart message sent to pt. After further review once sending pt the mychart, saw on 10/30 there was a comment about the dermatology referral stating that they were scheduling into the month of June for new patients and due to this, pt said it was too far out and declined scheduling an appt at this time so referral was closed. Routing info back to [employer].

## 2024-06-03 ENCOUNTER — Encounter: Payer: Self-pay | Admitting: Urology

## 2024-06-05 ENCOUNTER — Ambulatory Visit: Payer: MEDICAID

## 2024-06-05 DIAGNOSIS — R208 Other disturbances of skin sensation: Secondary | ICD-10-CM

## 2024-06-05 DIAGNOSIS — M6281 Muscle weakness (generalized): Secondary | ICD-10-CM

## 2024-06-05 DIAGNOSIS — R278 Other lack of coordination: Secondary | ICD-10-CM | POA: Diagnosis not present

## 2024-06-05 DIAGNOSIS — M25531 Pain in right wrist: Secondary | ICD-10-CM

## 2024-06-05 DIAGNOSIS — M25631 Stiffness of right wrist, not elsewhere classified: Secondary | ICD-10-CM

## 2024-06-05 NOTE — Therapy (Signed)
 OUTPATIENT OCCUPATIONAL THERAPY ORTHO TREATMENT  Patient Name: Clinton Fisher MRN: [id number] DOB:Mar 18, 1991, 33 y.o., male Today's Date: 06/05/2024  PCP: Knute Thersia Bitters, NP REFERRING PROVIDER: Arlinda Buster, MD  END OF SESSION:  OT End of Session - 06/05/24 1101     Visit Number 9    Number of Visits 12    Date for Recertification  06/12/24    Authorization Type [facility] [facility]    Authorization - Visit Number 9    Authorization - Number of Visits 12    OT Start Time 1100    OT Stop Time 1140    OT Time Calculation (min) 40 min    Equipment Utilized During Treatment 2# and 6# medicine balls, fluidotherapy, racquets, basketball    Activity Tolerance Patient tolerated treatment well    Behavior During Therapy WFL for tasks assessed/performed              Past Medical History:  Diagnosis Date   Abrasion of index finger 07/29/2013   left   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   Manic affective disorder with recurrent episode (HCC)    Seasonal allergies    sore throat 07/29/2013   Skin problem 12/07/2021   Past Surgical History:  Procedure Laterality Date   CYST EXCISION PERINEAL N/A 07/12/2023   Procedure: SCROTUM EXPLORATION-excision of penile sebaceous cyst;  Surgeon: Sherrilee Belvie CROME, MD;  Location: [facility];  Service: Urology;  Laterality: N/A;   FOOT FOREIGN BODY REMOVAL Right 01/19/2011   KNEE ARTHROSCOPY Right 08/01/2013   Procedure: ARTHROSCOPY RIGHT KNEE, Chondroplasty, Excision of Plica;  Surgeon: Norleen CROME Gavel, MD;  Location: [facility];  Service: Orthopedics;  Laterality: Right;   REPAIR EXTENSOR TENDON Right 02/15/2024   Procedure: REPAIR, TENDON, EXTENSOR;  Surgeon: Arlinda Buster, MD;  Location: [facility];  Service: Orthopedics;  Laterality: Right;  RIGHT WRIST EXTENSOR CARPI ULNARIS SUBSHEATH RECONSTRUCTION   SCROTAL  EXPLORATION N/A 11/09/2022   Procedure: SCROTUM EXPLORATION-excision of scrotal sebaceous cyst;  Surgeon: Sherrilee Belvie CROME, MD;  Location: [facility];  Service: Urology;  Laterality: N/A;   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 02/15/2024   Penile cyst 07/12/2023   Class 1 obesity due to excess calories without serious comorbidity with body mass index (BMI) of 31.0 to 31.9 in adult 02/28/2023   Scrotal sebaceous cyst 11/09/2022   Tendinitis of flexor tendon of both hands 07/19/2022   Hemorrhoids 02/25/2021   Chronic pain of right knee 01/11/2021   Bipolar 1 disorder (HCC) 10/18/2018   Tobacco use disorder 08/20/2017   GAD (generalized anxiety disorder) 09/25/2016    ONSET DATE: 04/07/2024 referral date 02/15/24 surgery date   REFERRING DIAG: D36.908J (ICD-10-CM) - Subluxation of extensor carpi ulnaris tendon, right, initial encounter  THERAPY DIAG:  1. Other lack of coordination (Primary)   2. Pain in right wrist   3. Stiffness of right wrist, not elsewhere classified   4. Muscle weakness (generalized)   5. Other disturbances of skin sensation       Rationale for Evaluation and Treatment: Rehabilitation  SUBJECTIVE:   SUBJECTIVE STATEMENT: Pt reports being able to drive his car with sole use of the R hand recently. Pt accompanied by: self  PERTINENT HISTORY:  S/p 02/15/24 R wrist ECU reconstruction. Had eval on 02/28/24 with [facility] who fabricated R [place] orthosis to immobilize wrist and forearm.  (REFER TO [place]  HAND PROTOCOL VOLUME I PGS. 182-183)   PRECAUTIONS: None  RED FLAGS: None   WEIGHT BEARING RESTRICTIONS: No  PAIN:  Are you having pain? No  FALLS: Has patient fallen in last 6 months? 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 Erwin  OBJECTIVE:  Note: Objective measures were completed at Evaluation unless otherwise noted.  HAND DOMINANCE: Right  ADLs: WFL  FUNCTIONAL OUTCOME MEASURES: Upper Extremity Functional Scale (UEFS): 54/80, currently functioning at 67.5% of full potential  UPPER EXTREMITY ROM:     Active ROM Right eval Left eval  Shoulder flexion    Shoulder abduction    Shoulder adduction    Shoulder extension    Shoulder internal rotation    Shoulder external rotation    Elbow flexion    Elbow extension    Wrist flexion  10  Wrist extension  15  Wrist ulnar deviation  5  Wrist radial deviation  10  Wrist pronation    Wrist supination    (Blank rows = not tested)  Active ROM Right eval Left eval  Thumb MCP (0-60)    Thumb IP (0-80)    Thumb Radial abd/add (0-55)     Thumb Palmar abd/add (0-45)     Thumb Opposition to Small Finger     Index MCP (0-90)     Index PIP (0-100)     Index DIP (0-70)      Long MCP (0-90)      Long PIP (0-100)      Long DIP (0-70)      Ring MCP (0-90)      Ring PIP (0-100)      Ring DIP (0-70)      Little MCP (0-90)      Little PIP (0-100)      Little DIP (0-70)      (Blank rows = not tested)   UPPER EXTREMITY MMT:     MMT Right eval Left eval  Shoulder flexion    Shoulder abduction    Shoulder adduction    Shoulder extension    Shoulder internal rotation    Shoulder external rotation    Middle trapezius    Lower trapezius    Elbow flexion    Elbow extension    Wrist flexion    Wrist extension    Wrist ulnar deviation    Wrist radial deviation    Wrist pronation    Wrist supination    (Blank rows = not tested)  HAND FUNCTION: Grip strength: Right: 33 lbs; Left: 100  lbs  COORDINATION: 9 Hole Peg test: Right: 28.25 sec; Left: 26.41 sec  SENSATION: Some numbness and tingling in R wrist   EDEMA: none  COGNITION: Overall cognitive status: Within functional limits for tasks assessed Areas of impairment: none  OBSERVATIONS: limited ROM in wrist, impaired grip strength, decrease in overall function   TREATMENT DATE: 06/02/24    Pt placed RUE in Fluidotherapy machine with supervised ROM x 9 min. Pt was educated to complete tendon glides and wrist AROM during  modality time to improve ROM and decrease pain/stiffness of affected extremity by use of the machine's massaging action and thermal properties.    Completed 10 prayer stretches to warm up for wrist strengthening for improved functional use of R hand/wrist.   With hammer and nail completed activity hammering nail into piece of plywood. Pt reported a stretching sensation but that it wasn't all that painful. Reminded pt of anti vibration work gloves if needed for work tasks. Pt agreed.   With blue firm resistance putty pt removed 5 items with sole use of R hand to improve grip strength.    With 2 lbs medicine ball and trampoline completed 10 tosses and catches to get pt used to force through hand and wrist. Next completed 10 wrist radial/ulnar deviation and elbow flexion with 6# medicine ball. With racquets and balloon completed activity hitting balloon to allow force through hand and wrist. Finally completed tosses and bounces with basketball with no c/o pain or discomfort. Educated pt in use of cold modality after completing heavier duty tasks and exercises if needed. Pt verbalized understanding.    PATIENT EDUCATION: Education details: SEE ABOVE Person educated: Patient Education method: Explanation, Demonstration, and Handouts Education comprehension: verbalized understanding and returned demonstration  HOME EXERCISE PROGRAM: 05/01/24: scar massage, wrist PROM (ACCESS CODE:  [id number]) 05/05/24: Theraputty HEP with red putty ([id number])  05/13/24: Shoulder isometrics (per pt request): (ACCESS CODE: [id number]) 05/15/24: Elbow/forearm isometrics ([id number]) 05/20/24: Wrist flexion/extension isometrics ( [id number])     GOALS: Goals reviewed with patient? Yes  SHORT TERM GOALS: Target date: 06/12/24  Pt will be independent with scar massage and prescribed HEP for ROM and strengthening of R hand Baseline: Initiated this date Goal status: IN PROGRESS  2.  Pt will only wear [place] orthosis for comfort and activities involving >5 lbs resistance Baseline: Pt new to OP OT, wears constantly especially for driving  Goal status: GOAL MET  3.  Pt will increase grip strength by at least 10 lbs in R hand Baseline: 33 lbs 05/13/24: 65.0 lbs Goal status: GOAL MET  4.  Pt will increase R wrist flexion and extension AROM by at least 15 degrees Baseline: Flexion 10 degrees, 15 degrees extension 05/13/24: 45 degrees flexion, 19 degrees extension  05/15/24: 50 degrees flexion, 45 degrees extension Goal status: GOAL MET    LONG TERM GOALS: Target date: 06/12/24  Pt will increase wrist flexion AROM by at least 50 degrees Baseline: 10 degrees  05/13/24: 45 degrees 05/15/24: 50 degrees 05/26/24: 65 degrees  Goal status: GOAL MET  2.  Pt will increase wrist extension AROM by at least 45 degrees Baseline: 15 degrees  05/13/24: 19 degrees 05/15/24: 45 degrees 05/26/24: 45 degrees Goal status: IN PROGRESS  3.  Pt will increase ulnar deviation ROM of R wrist by at least 20 degrees Baseline: 5 degrees  05/13/24: 9 degrees 05/15/24: 20 degrees 05/26/24: 30 degrees Goal status: GOAL MET  4.  Pt will increase UEFS index score to at least 70/80, indicating increase of function by 20% Baseline: 54/80  05/26/24: 59/80  Goal status: IN PROGRESS  5.  Pt will increase R grip strength by at least 40 pounds  Baseline: 33 lbs 05/13/24: 65.0 lbs 05/26/24: 70.5 lbs Goal  status: IN PROGRESS    ASSESSMENT:  CLINICAL IMPRESSION: Patient is a 33 y.o. male who was seen today for occupational therapy tx for 02/15/24 R wrist ECU reconstruction d/t subluxation. Hx includes ECU tendon sheath reconstruction, bipolar disorder I, anxiety, B wrist  tendonitis. Pt participating well with modalities and activities driving force through wrist and hand. Pt at this time would benefit from continued skilled services to improve strength and ROM in dominant R hand to carry over with work tasks and ADL/IADL.  PERFORMANCE DEFICITS: in functional skills including ADLs, IADLs, ROM, strength, pain, skin integrity, and UE functional use.  IMPAIRMENTS: are limiting patient from ADLs, IADLs, and work.   COMORBIDITIES: may have co-morbidities  that affects occupational performance. Patient will benefit from skilled OT to address above impairments and improve overall function.  MODIFICATION OR ASSISTANCE TO COMPLETE EVALUATION: Min-Moderate modification of tasks or assist with assess necessary to complete an evaluation.  OT OCCUPATIONAL PROFILE AND HISTORY: Detailed assessment: Review of records and additional review of physical, cognitive, psychosocial history related to current functional performance.  CLINICAL DECISION MAKING: Moderate - several treatment options, min-mod task modification necessary  REHAB POTENTIAL: Good  EVALUATION COMPLEXITY: Moderate      PLAN:  OT FREQUENCY: 2x/week  OT DURATION: 6 weeks  PLANNED INTERVENTIONS: 97168 OT Re-evaluation, 97535 self care/ADL training, 02889 therapeutic exercise, 97530 therapeutic activity, 97035 ultrasound, 97018 paraffin, 02960 fluidotherapy, 97010 moist heat, 97010 cryotherapy, 97034 contrast bath, 97760 Orthotic Initial, 97763 Orthotic/Prosthetic subsequent, scar mobilization, passive range of motion, coping strategies training, patient/family education, and DME and/or AE instructions  RECOMMENDED OTHER SERVICES: none at  this time  CONSULTED AND AGREED WITH PLAN OF CARE: Patient  PLAN FOR NEXT SESSION: Fluidotherapy or ultrasound Wrist strengthening and ROM activities Hand strengthening activities/force activities Re-take ROM and grip strength   Nakeda [employer], OT 06/05/2024, 11:48 AM

## 2024-06-09 ENCOUNTER — Ambulatory Visit: Payer: MEDICAID

## 2024-06-12 ENCOUNTER — Ambulatory Visit: Payer: MEDICAID

## 2024-06-12 DIAGNOSIS — M25631 Stiffness of right wrist, not elsewhere classified: Secondary | ICD-10-CM

## 2024-06-12 DIAGNOSIS — R278 Other lack of coordination: Secondary | ICD-10-CM

## 2024-06-12 DIAGNOSIS — M25531 Pain in right wrist: Secondary | ICD-10-CM

## 2024-06-12 DIAGNOSIS — M6281 Muscle weakness (generalized): Secondary | ICD-10-CM

## 2024-06-12 NOTE — Therapy (Signed)
 OUTPATIENT OCCUPATIONAL THERAPY ORTHO TREATMENT  Patient Name: Clinton Fisher MRN: [id number] DOB:1990/12/16, 33 y.o., male Today's Date: 06/12/2024  PCP: Knute Thersia Bitters, NP REFERRING PROVIDER: Arlinda Buster, MD  END OF SESSION:  OT End of Session - 06/12/24 0928     Visit Number 10    Number of Visits 12    Date for Recertification  06/17/24    Authorization Type [facility]    Authorization - Visit Number 10    Authorization - Number of Visits 12    OT Start Time 0846    OT Stop Time 0925    OT Time Calculation (min) 39 min    Equipment Utilized During Treatment nuts and bolts, screwdriver, ultrasound, racquets, balloon    Activity Tolerance Patient tolerated treatment well    Behavior During Therapy [facility] for tasks assessed/performed               Past Medical History:  Diagnosis Date   Abrasion of index finger 07/29/2013   left   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   Manic affective disorder with recurrent episode (HCC)    Seasonal allergies    sore throat 07/29/2013   Skin problem 12/07/2021   Past Surgical History:  Procedure Laterality Date   CYST EXCISION PERINEAL N/A 07/12/2023   Procedure: SCROTUM EXPLORATION-excision of penile sebaceous cyst;  Surgeon: Sherrilee Belvie CROME, MD;  Location: [facility];  Service: Urology;  Laterality: N/A;   FOOT FOREIGN BODY REMOVAL Right 01/19/2011   KNEE ARTHROSCOPY Right 08/01/2013   Procedure: ARTHROSCOPY RIGHT KNEE, Chondroplasty, Excision of Plica;  Surgeon: Norleen CROME Gavel, MD;  Location: [facility];  Service: Orthopedics;  Laterality: Right;   REPAIR EXTENSOR TENDON Right 02/15/2024   Procedure: REPAIR, TENDON, EXTENSOR;  Surgeon: Arlinda Buster, MD;  Location: [facility];  Service: Orthopedics;  Laterality: Right;  RIGHT WRIST EXTENSOR CARPI ULNARIS SUBSHEATH RECONSTRUCTION   SCROTAL  EXPLORATION N/A 11/09/2022   Procedure: SCROTUM EXPLORATION-excision of scrotal sebaceous cyst;  Surgeon: Sherrilee Belvie CROME, MD;  Location: [facility];  Service: Urology;  Laterality: N/A;   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 02/15/2024   Penile cyst 07/12/2023   Class 1 obesity due to excess calories without serious comorbidity with body mass index (BMI) of 31.0 to 31.9 in adult 02/28/2023   Scrotal sebaceous cyst 11/09/2022   Tendinitis of flexor tendon of both hands 07/19/2022   Hemorrhoids 02/25/2021   Chronic pain of right knee 01/11/2021   Bipolar 1 disorder (HCC) 10/18/2018   Tobacco use disorder 08/20/2017   GAD (generalized anxiety disorder) 09/25/2016    ONSET DATE: 04/07/2024 referral date 02/15/24 surgery date   REFERRING DIAG: D36.908J (ICD-10-CM) - Subluxation of extensor carpi ulnaris tendon, right, initial encounter  THERAPY DIAG:   1. Other lack of coordination (Primary)   2. Pain in right wrist  3. Stiffness of right wrist, not elsewhere classified   4. Muscle weakness (generalized)          Rationale for Evaluation and Treatment: Rehabilitation  SUBJECTIVE:   SUBJECTIVE STATEMENT: Pt reports that he had to cancel previous appt d/t throwing out his back and shoulder visiting family last week, but is doing much better. Pt accompanied by:  self  PERTINENT HISTORY: S/p 02/15/24 R wrist ECU reconstruction. Had eval on 02/28/24 with [facility] who fabricated R [place] orthosis to immobilize wrist and forearm.  (REFER TO [place]  HAND PROTOCOL VOLUME I PGS. 182-183)   PRECAUTIONS: None  RED FLAGS: None   WEIGHT BEARING RESTRICTIONS: No  PAIN:  Are you having pain? Yes: NPRS scale: 2/10 Pain location: R wrist  Pain description: just a twinge Aggravating factors: excessive wrist movement Relieving factors: rest    FALLS: Has patient fallen in last 6 months? 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 Erwin  OBJECTIVE:  Note: Objective measures were completed at Evaluation unless otherwise noted.  HAND DOMINANCE: Right  ADLs: WFL  FUNCTIONAL OUTCOME MEASURES: Upper Extremity Functional Scale (UEFS): 54/80, currently functioning at 67.5% of full potential  UPPER EXTREMITY ROM:     Active ROM Right eval Left eval  Shoulder flexion    Shoulder abduction    Shoulder adduction    Shoulder extension    Shoulder internal rotation    Shoulder external rotation    Elbow flexion    Elbow extension    Wrist flexion  10  Wrist extension  15  Wrist ulnar deviation  5  Wrist radial deviation  10  Wrist pronation    Wrist supination    (Blank rows = not tested)  Active ROM Right eval Left eval  Thumb MCP (0-60)    Thumb IP (0-80)    Thumb Radial abd/add (0-55)     Thumb Palmar abd/add (0-45)     Thumb Opposition to Small Finger     Index MCP (0-90)     Index PIP (0-100)     Index DIP (0-70)      Long MCP (0-90)      Long PIP (0-100)      Long DIP (0-70)      Ring MCP (0-90)      Ring PIP (0-100)      Ring DIP (0-70)      Little MCP (0-90)      Little PIP (0-100)      Little DIP (0-70)      (Blank rows = not tested)   UPPER EXTREMITY MMT:     MMT Right eval Left eval  Shoulder flexion    Shoulder abduction    Shoulder adduction    Shoulder extension    Shoulder internal rotation    Shoulder external rotation    Middle trapezius    Lower trapezius    Elbow flexion    Elbow extension    Wrist flexion    Wrist extension    Wrist ulnar  deviation    Wrist radial deviation    Wrist pronation    Wrist supination    (Blank rows = not tested)  HAND FUNCTION: Grip strength: Right: 33 lbs; Left: 100 lbs  COORDINATION: 9 Hole Peg test: Right: 28.25 sec; Left: 26.41 sec  SENSATION: Some numbness and tingling in R wrist   EDEMA: none  COGNITION: Overall cognitive status: Within functional limits for tasks assessed Areas of impairment: none  OBSERVATIONS: limited ROM in wrist, impaired grip strength, decrease in overall function   TREATMENT DATE: 06/12/24  Ultrasound x 8 minutes, 3.3 Mhz, 1.2 wts/cm2, 20% duty cycle over R scar area.   Completed 10 prayer stretches to warm up for wrist strengthening for improved functional use of R hand/wrist. Measured wrist extension and grip strength, see Goals below for updated measurements.  With wrench and nuts and bolts of varying sizes pt engaged in wrist radial/ulnar deviation of R hand screwing and unscrewing bolts. Pt reported no pain.  Pt informed he had one more visit scheduled, making that the 11th session out of 12. Pt requested to be scheduled for one more, this was completed to ensure pt has everything he needs prior to discharge.  Pt engaged in balloon batting activity with R hand and racquet to accustom wrist to taking force through R hand. Pt had no complaints.      PATIENT EDUCATION: Education details: SEE ABOVE Person educated: Patient Education method: Explanation, Demonstration, and Handouts Education comprehension: verbalized understanding and returned demonstration  HOME EXERCISE PROGRAM: 05/01/24: scar massage, wrist PROM (ACCESS CODE: [id number]) 05/05/24: Theraputty HEP with red putty ([id number])  05/13/24: Shoulder isometrics (per pt request): (ACCESS CODE: [id number]) 05/15/24: Elbow/forearm isometrics ([id number]) 05/20/24: Wrist flexion/extension isometrics ( [id number])     GOALS: Goals reviewed with patient? Yes  SHORT TERM GOALS: Target date:  06/12/24  Pt will be independent with scar massage and prescribed HEP for ROM and strengthening of R hand Baseline: Initiated this date Goal status: GOAL MET  2.  Pt will only wear [place] orthosis for comfort and activities involving >5 lbs resistance Baseline: Pt new to OP OT, wears constantly especially for driving  Goal status: GOAL MET  3.  Pt will increase grip strength by at least 10 lbs in R hand Baseline: 33 lbs 05/13/24: 65.0 lbs Goal status: GOAL MET  4.  Pt will increase R wrist flexion and extension AROM by at least 15 degrees Baseline: Flexion 10 degrees, 15 degrees extension 05/13/24: 45 degrees flexion, 19 degrees extension  05/15/24: 50 degrees flexion, 45 degrees extension Goal status: GOAL MET    LONG TERM GOALS: Target date: 06/17/24  Pt will increase wrist flexion AROM by at least 50 degrees Baseline: 10 degrees  05/13/24: 45 degrees 05/15/24: 50 degrees 05/26/24: 65 degrees  Goal status: GOAL MET  2.  Pt will increase wrist extension AROM by at least 45 degrees Baseline: 15 degrees  05/13/24: 19 degrees 05/15/24: 45 degrees 05/26/24: 45 degrees 06/12/24: 52 degrees  Goal status: IN PROGRESS  3.  Pt will increase ulnar deviation ROM of R wrist by at least 20 degrees Baseline: 5 degrees  05/13/24: 9 degrees 05/15/24: 20 degrees 05/26/24: 30 degrees Goal status: GOAL MET  4.  Pt will increase UEFS index score to at least 70/80, indicating increase of function by 20% Baseline: 54/80  05/26/24: 59/80  Goal status: IN PROGRESS  5.  Pt will increase R grip strength by at least 40 pounds  Baseline: 33 lbs 05/13/24: 65.0 lbs 05/26/24: 70.5 lbs 05/2024: 85.7 Goal status: GOAL MET    ASSESSMENT:  CLINICAL IMPRESSION: Patient is a 33 y.o. male who was seen today for occupational therapy tx for 02/15/24 R wrist ECU reconstruction d/t subluxation. Hx includes ECU tendon sheath reconstruction, bipolar disorder I, anxiety, B wrist tendonitis. Pt  participating well with modalities, stretches, and activities driving force through wrist and hand. Pt is also receptive to education in AE Pt at this time would benefit from continued skilled services to improve strength and ROM in dominant R hand to  carry over with work tasks and ADL/IADL.  PERFORMANCE DEFICITS: in functional skills including ADLs, IADLs, ROM, strength, pain, skin integrity, and UE functional use.  IMPAIRMENTS: are limiting patient from ADLs, IADLs, and work.   COMORBIDITIES: may have co-morbidities  that affects occupational performance. Patient will benefit from skilled OT to address above impairments and improve overall function.  MODIFICATION OR ASSISTANCE TO COMPLETE EVALUATION: Min-Moderate modification of tasks or assist with assess necessary to complete an evaluation.  OT OCCUPATIONAL PROFILE AND HISTORY: Detailed assessment: Review of records and additional review of physical, cognitive, psychosocial history related to current functional performance.  CLINICAL DECISION MAKING: Moderate - several treatment options, min-mod task modification necessary  REHAB POTENTIAL: Good  EVALUATION COMPLEXITY: Moderate      PLAN:  OT FREQUENCY: 2x/week  OT DURATION: 6 weeks  PLANNED INTERVENTIONS: 97168 OT Re-evaluation, 97535 self care/ADL training, 02889 therapeutic exercise, 97530 therapeutic activity, 97035 ultrasound, 97018 paraffin, 02960 fluidotherapy, 97010 moist heat, 97010 cryotherapy, 97034 contrast bath, 97760 Orthotic Initial, 97763 Orthotic/Prosthetic subsequent, scar mobilization, passive range of motion, coping strategies training, patient/family education, and DME and/or AE instructions  RECOMMENDED OTHER SERVICES: none at this time  CONSULTED AND AGREED WITH PLAN OF CARE: Patient  PLAN FOR NEXT SESSION: Fluidotherapy or ultrasound Wrist strengthening and ROM activities Hand strengthening activities/force activities Joint protection  education   [place], OT 06/12/2024, 9:35 AM

## 2024-06-16 ENCOUNTER — Ambulatory Visit: Payer: MEDICAID

## 2024-06-16 DIAGNOSIS — M25531 Pain in right wrist: Secondary | ICD-10-CM

## 2024-06-16 DIAGNOSIS — M6281 Muscle weakness (generalized): Secondary | ICD-10-CM

## 2024-06-16 DIAGNOSIS — R278 Other lack of coordination: Secondary | ICD-10-CM | POA: Diagnosis not present

## 2024-06-16 DIAGNOSIS — M25631 Stiffness of right wrist, not elsewhere classified: Secondary | ICD-10-CM

## 2024-06-16 NOTE — Therapy (Addendum)
 OUTPATIENT OCCUPATIONAL THERAPY ORTHO TREATMENT  Patient Name: Clinton Fisher MRN: [id number] DOB:10/10/1990, 33 y.o., male Today's Date: 06/16/2024  PCP: Knute Thersia Bitters, NP REFERRING PROVIDER: Arlinda Buster, MD  END OF SESSION:  OT End of Session - 06/16/24 9071     Visit Number 11    Number of Visits 12    Date for Recertification  06/17/24    Authorization Type [facility]    Authorization - Visit Number 11    Authorization - Number of Visits 12    OT Start Time 0851    OT Stop Time 0929    OT Time Calculation (min) 38 min    Equipment Utilized During Treatment fluido, 4 lb weight, Gymnic heavymed balls, screws and screwdriver    Activity Tolerance Patient tolerated treatment well    Behavior During Therapy WFL for tasks assessed/performed                Past Medical History:  Diagnosis Date   Abrasion of index finger 07/29/2013   left   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   Manic affective disorder with recurrent episode (HCC)    Seasonal allergies    sore throat 07/29/2013   Skin problem 12/07/2021   Past Surgical History:  Procedure Laterality Date   CYST EXCISION PERINEAL N/A 07/12/2023   Procedure: SCROTUM EXPLORATION-excision of penile sebaceous cyst;  Surgeon: Sherrilee Belvie CROME, MD;  Location: [facility];  Service: Urology;  Laterality: N/A;   FOOT FOREIGN BODY REMOVAL Right 01/19/2011   KNEE ARTHROSCOPY Right 08/01/2013   Procedure: ARTHROSCOPY RIGHT KNEE, Chondroplasty, Excision of Plica;  Surgeon: Norleen CROME Gavel, MD;  Location: [facility];  Service: Orthopedics;  Laterality: Right;   REPAIR EXTENSOR TENDON Right 02/15/2024   Procedure: REPAIR, TENDON, EXTENSOR;  Surgeon: Arlinda Buster, MD;  Location: [facility];  Service: Orthopedics;  Laterality: Right;  RIGHT WRIST EXTENSOR CARPI ULNARIS SUBSHEATH RECONSTRUCTION    SCROTAL EXPLORATION N/A 11/09/2022   Procedure: SCROTUM EXPLORATION-excision of scrotal sebaceous cyst;  Surgeon: Sherrilee Belvie CROME, MD;  Location: [facility];  Service: Urology;  Laterality: N/A;   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 02/15/2024   Penile cyst 07/12/2023   Class 1 obesity due to excess calories without serious comorbidity with body mass index (BMI) of 31.0 to 31.9 in adult 02/28/2023   Scrotal sebaceous cyst 11/09/2022   Tendinitis of flexor tendon of both hands 07/19/2022   Hemorrhoids 02/25/2021   Chronic pain of right knee 01/11/2021   Bipolar 1 disorder (HCC) 10/18/2018   Tobacco use disorder 08/20/2017   GAD (generalized anxiety disorder) 09/25/2016    ONSET DATE: 04/07/2024 referral date 02/15/24 surgery date   REFERRING DIAG: D36.908J (ICD-10-CM) - Subluxation of extensor carpi ulnaris tendon, right, initial encounter  THERAPY DIAG:   1. Other lack of coordination (Primary)   2. Pain in right wrist  3. Stiffness of right wrist, not elsewhere classified   4. Muscle weakness (generalized)          Rationale for Evaluation and Treatment: Rehabilitation  SUBJECTIVE:   SUBJECTIVE STATEMENT: Pt reports he feels like he can get back to daily routine and be able to return to his workouts. Pt accompanied by: self  PERTINENT HISTORY: S/p 02/15/24 R wrist ECU reconstruction. Had eval on 02/28/24 with [facility] who fabricated R [place] orthosis to immobilize wrist and forearm.  (REFER TO [place]  HAND PROTOCOL VOLUME I PGS. 182-183)   PRECAUTIONS: None  RED FLAGS: None   WEIGHT BEARING RESTRICTIONS: No  PAIN:  Are you having pain? Yes: NPRS scale: 1/10 Pain location: R wrist  Pain description: just a twinge Aggravating factors: excessive wrist movement Relieving factors: rest   FALLS: Has patient fallen in last  6 months? 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 Erwin  OBJECTIVE:  Note: Objective measures were completed at Evaluation unless otherwise noted.  HAND DOMINANCE: Right  ADLs: WFL  FUNCTIONAL OUTCOME MEASURES: Upper Extremity Functional Scale (UEFS): 54/80, currently functioning at 67.5% of full potential  UPPER EXTREMITY ROM:     Active ROM Right eval Left eval  Shoulder flexion    Shoulder abduction    Shoulder adduction    Shoulder extension    Shoulder internal rotation    Shoulder external rotation    Elbow flexion    Elbow extension    Wrist flexion  10  Wrist extension  15  Wrist ulnar deviation  5  Wrist radial deviation  10  Wrist pronation    Wrist supination    (Blank rows = not tested)  Active ROM Right eval Left eval  Thumb MCP (0-60)    Thumb IP (0-80)    Thumb Radial abd/add (0-55)     Thumb Palmar abd/add (0-45)     Thumb Opposition to Small Finger     Index MCP (0-90)     Index PIP (0-100)     Index DIP (0-70)      Long MCP (0-90)      Long PIP (0-100)      Long DIP (0-70)      Ring MCP (0-90)      Ring PIP (0-100)      Ring DIP (0-70)      Little MCP (0-90)      Little PIP (0-100)      Little DIP (0-70)      (Blank rows = not tested)   UPPER EXTREMITY MMT:     MMT Right eval Left eval  Shoulder flexion    Shoulder abduction    Shoulder adduction    Shoulder extension    Shoulder internal rotation    Shoulder external rotation    Middle trapezius    Lower trapezius    Elbow flexion    Elbow extension    Wrist flexion    Wrist extension    Wrist ulnar deviation    Wrist radial deviation    Wrist  pronation    Wrist supination    (Blank rows = not tested)  HAND FUNCTION: Grip strength: Right: 33 lbs; Left: 100 lbs  COORDINATION: 9 Hole Peg test: Right: 28.25 sec; Left: 26.41 sec  SENSATION: Some numbness and tingling in R wrist   EDEMA: none  COGNITION: Overall cognitive status: Within functional limits for tasks assessed Areas of impairment: none  OBSERVATIONS: limited ROM in wrist, impaired grip strength, decrease in overall function   TREATMENT DATE: 06/16/24  Pt placed RUE in Fluidotherapy machine with supervised ROM x 10 min. Pt was educated to complete wrist and hand AROM during modality time to improve ROM and decrease pain/stiffness of affected extremity by use of the machine's massaging action and thermal properties.    Completed 10 prayer stretches to warm up for wrist strengthening for improved functional use of R hand/wrist. With 4# wrist weight completed 1x20 wrist flexion, extension, and radial/ulnar deviation. Meausred wrist extension, see Goals for updated measurement.  Engaged in activities making pt take force through wrist including tossing basketball and bouncing medicine balls off of trampoline, no c/o pain.   Pt engaged in further therapeutic activities screwing and unscrewing screws form board with screwdriver. Pt had no c/o pain.   Educated pt in joint protection strategies, see Pt instructions for handout. Informed pt of next appt and plan for discharge, pt in agreememt/      PATIENT EDUCATION: Education details: SEE ABOVE Person educated: Patient Education method: Explanation, Demonstration, and Handouts Education comprehension: verbalized understanding and returned demonstration  HOME EXERCISE PROGRAM: 05/01/24: scar massage, wrist PROM (ACCESS CODE: [id number]) 05/05/24: Theraputty HEP with red putty ([id number])  05/13/24: Shoulder isometrics (per pt request): (ACCESS CODE: [id number]) 05/15/24: Elbow/forearm isometrics  ([id number]) 05/20/24: Wrist flexion/extension isometrics ( [id number])     GOALS: Goals reviewed with patient? Yes  SHORT TERM GOALS: Target date: 06/12/24  Pt will be independent with scar massage and prescribed HEP for ROM and strengthening of R hand Baseline: Initiated this date Goal status: GOAL MET  2.  Pt will only wear [place] orthosis for comfort and activities involving >5 lbs resistance Baseline: Pt new to OP OT, wears constantly especially for driving  Goal status: GOAL MET  3.  Pt will increase grip strength by at least 10 lbs in R hand Baseline: 33 lbs 05/13/24: 65.0 lbs Goal status: GOAL MET  4.  Pt will increase R wrist flexion and extension AROM by at least 15 degrees Baseline: Flexion 10 degrees, 15 degrees extension 05/13/24: 45 degrees flexion, 19 degrees extension  05/15/24: 50 degrees flexion, 45 degrees extension Goal status: GOAL MET    LONG TERM GOALS: Target date: 06/17/24  Pt will increase wrist flexion AROM by at least 50 degrees Baseline: 10 degrees  05/13/24: 45 degrees 05/15/24: 50 degrees 05/26/24: 65 degrees  Goal status: GOAL MET  2.  Pt will increase wrist extension AROM by at least 45 degrees Baseline: 15 degrees  05/13/24: 19 degrees 05/15/24: 45 degrees 05/26/24: 45 degrees 06/12/24: 52 degrees  06/16/24: 60 degrees Goal status: GOAL MET  3.  Pt will increase ulnar deviation ROM of R wrist by at least 20 degrees Baseline: 5 degrees  05/13/24: 9 degrees 05/15/24: 20 degrees 05/26/24: 30 degrees Goal status: GOAL MET  4.  Pt will increase UEFS index score to at least 70/80, indicating increase of function by 20% Baseline: 54/80  05/26/24: 59/80  Goal status: IN PROGRESS  5.  Pt will increase R grip strength by at least 40 pounds  Baseline: 33 lbs 05/13/24: 65.0 lbs 05/26/24: 70.5 lbs 05/2024: 85.7 Goal status: GOAL MET    ASSESSMENT:  CLINICAL IMPRESSION: Patient is a 33 y.o. male who was seen today for  occupational therapy tx for 02/15/24 R wrist ECU reconstruction d/t subluxation. Hx includes ECU tendon sheath reconstruction, bipolar disorder I, anxiety, B wrist tendonitis. Pt has made great progress and will be ready for discharge tomorrow.  PERFORMANCE DEFICITS: in functional skills including ADLs, IADLs, ROM, strength,  pain, skin integrity, and UE functional use.  IMPAIRMENTS: are limiting patient from ADLs, IADLs, and work.   COMORBIDITIES: may have co-morbidities  that affects occupational performance. Patient will benefit from skilled OT to address above impairments and improve overall function.  MODIFICATION OR ASSISTANCE TO COMPLETE EVALUATION: Min-Moderate modification of tasks or assist with assess necessary to complete an evaluation.  OT OCCUPATIONAL PROFILE AND HISTORY: Detailed assessment: Review of records and additional review of physical, cognitive, psychosocial history related to current functional performance.  CLINICAL DECISION MAKING: Moderate - several treatment options, min-mod task modification necessary  REHAB POTENTIAL: Good  EVALUATION COMPLEXITY: Moderate      PLAN:  OT FREQUENCY: 2x/week  OT DURATION: 6 weeks  PLANNED INTERVENTIONS: 97168 OT Re-evaluation, 97535 self care/ADL training, 02889 therapeutic exercise, 97530 therapeutic activity, 97035 ultrasound, 97018 paraffin, 02960 fluidotherapy, 97010 moist heat, 97010 cryotherapy, 97034 contrast bath, 97760 Orthotic Initial, 97763 Orthotic/Prosthetic subsequent, scar mobilization, passive range of motion, coping strategies training, patient/family education, and DME and/or AE instructions  RECOMMENDED OTHER SERVICES: none at this time  CONSULTED AND AGREED WITH PLAN OF CARE: Patient  PLAN FOR NEXT SESSION: Check UEFS Discharge  [place], OT 06/16/2024, 9:31 AM

## 2024-06-17 ENCOUNTER — Ambulatory Visit: Payer: MEDICAID

## 2024-06-17 DIAGNOSIS — R278 Other lack of coordination: Secondary | ICD-10-CM | POA: Diagnosis not present

## 2024-06-17 DIAGNOSIS — M25531 Pain in right wrist: Secondary | ICD-10-CM

## 2024-06-17 DIAGNOSIS — M6281 Muscle weakness (generalized): Secondary | ICD-10-CM

## 2024-06-17 DIAGNOSIS — M25631 Stiffness of right wrist, not elsewhere classified: Secondary | ICD-10-CM

## 2024-06-17 NOTE — Therapy (Addendum)
 OUTPATIENT OCCUPATIONAL THERAPY ORTHO TREATMENT AND DISCHARGE  Patient Name: Clinton Fisher MRN: [id number] DOB:20-Dec-1990, 33 y.o., male Today's Date: 06/17/2024  PCP: Knute Thersia Bitters, NP REFERRING PROVIDER: Arlinda Buster, MD  END OF SESSION:  OT End of Session - 06/17/24 1617     Visit Number 12    Number of Visits 12    Authorization Type [employer]    Authorization - Visit Number 12    Authorization - Number of Visits 12    OT Start Time 1528    OT Stop Time 1608    OT Time Calculation (min) 40 min    Equipment Utilized During Treatment ultrasonud, basketball, blaze pods    Activity Tolerance Patient tolerated treatment well    Behavior During Therapy WFL for tasks assessed/performed          OCCUPATIONAL THERAPY DISCHARGE SUMMARY  Visits from Start of Care: Pt received 12 visits including eval from 05/01/24-06/17/24.   Current functional level related to goals / functional outcomes: Pt met 4/4 STGs and 4/5 LTGs   Remaining deficits: Occasional wrist pain   Education / Equipment: Educated in joint protection strategies, use of heat and cold modalities, scar tissue massage, desensitization, AE for improved ease and safety with desired activities   Patient agrees to discharge. Patient goals were partially met. Patient is being discharged due to using all authorized visits and being pleased with progress made thus far.          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   Manic affective disorder with recurrent episode (HCC)    Seasonal allergies    sore throat 07/29/2013   Skin problem 12/07/2021   Past Surgical History:  Procedure Laterality Date   CYST EXCISION PERINEAL N/A 07/12/2023   Procedure: SCROTUM EXPLORATION-excision of penile sebaceous cyst;  Surgeon:  Sherrilee Belvie CROME, MD;  Location: [facility];  Service: Urology;  Laterality: N/A;   FOOT FOREIGN BODY REMOVAL Right 01/19/2011   KNEE ARTHROSCOPY Right 08/01/2013   Procedure: ARTHROSCOPY RIGHT KNEE, Chondroplasty, Excision of Plica;  Surgeon: Norleen CROME Gavel, MD;  Location: [facility];  Service: Orthopedics;  Laterality: Right;   REPAIR EXTENSOR TENDON Right 02/15/2024   Procedure: REPAIR, TENDON, EXTENSOR;  Surgeon: Arlinda Buster, MD;  Location: [facility];  Service: Orthopedics;  Laterality: Right;  RIGHT WRIST EXTENSOR CARPI ULNARIS SUBSHEATH RECONSTRUCTION   SCROTAL EXPLORATION N/A 11/09/2022   Procedure: SCROTUM EXPLORATION-excision of scrotal sebaceous cyst;  Surgeon: Sherrilee Belvie CROME, MD;  Location: [facility];  Service: Urology;  Laterality: N/A;   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 02/15/2024   Penile cyst 07/12/2023   Class 1 obesity due to excess calories without serious comorbidity with body mass index (BMI) of 31.0 to 31.9 in adult 02/28/2023   Scrotal sebaceous cyst 11/09/2022   Tendinitis of flexor tendon of both hands 07/19/2022   Hemorrhoids 02/25/2021   Chronic pain of right knee 01/11/2021   Bipolar 1 disorder (HCC) 10/18/2018   Tobacco use disorder 08/20/2017   GAD (generalized anxiety disorder) 09/25/2016    ONSET DATE: 04/07/2024 referral date 02/15/24 surgery date  REFERRING DIAG: D36.908J (ICD-10-CM) - Subluxation of extensor carpi ulnaris tendon, right, initial encounter  THERAPY DIAG:   1. Other lack of coordination (Primary)   2. Pain in right wrist  3. Stiffness of right wrist, not elsewhere classified   4. Muscle weakness (generalized)          Rationale for Evaluation and Treatment: Rehabilitation  SUBJECTIVE:   SUBJECTIVE STATEMENT: Pt reports his wrist was a little  sore after activity yesterday utilizing medicine balls and trampoline.  Pt accompanied by: self  PERTINENT HISTORY: S/p 02/15/24 R wrist ECU reconstruction. Had eval on 02/28/24 with [facility] who fabricated R [place] orthosis to immobilize wrist and forearm.  (REFER TO [place]  HAND PROTOCOL VOLUME I PGS. [id number])   PRECAUTIONS: None  RED FLAGS: None   WEIGHT BEARING RESTRICTIONS: No  PAIN:  Are you having pain? Yes: NPRS scale: 1/10 Pain location: R wrist  Pain description: just a twinge Aggravating factors: excessive wrist movement Relieving factors: rest   FALLS: Has patient fallen in last 6 months? 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 Erwin  OBJECTIVE:  Note: Objective measures were completed at Evaluation unless otherwise noted.  HAND DOMINANCE: Right  ADLs: WFL  FUNCTIONAL OUTCOME MEASURES: Upper Extremity Functional Scale (UEFS): 54/80, currently functioning at 67.5% of full potential  06/17/24: 69/80; currently functioning at 86.25% of full potential UPPER EXTREMITY ROM:     Active ROM Right eval Left eval  Shoulder flexion    Shoulder abduction    Shoulder adduction    Shoulder extension    Shoulder internal rotation    Shoulder external rotation    Elbow flexion    Elbow extension    Wrist flexion  10  Wrist extension  15  Wrist ulnar deviation  5  Wrist radial deviation  10  Wrist pronation    Wrist supination    (Blank rows = not tested)  Active ROM Right eval Left eval  Thumb MCP (0-60)    Thumb IP (0-80)    Thumb Radial abd/add (0-55)     Thumb Palmar abd/add (0-45)     Thumb Opposition to Small Finger     Index  MCP (0-90)     Index PIP (0-100)     Index DIP (0-70)      Long MCP (0-90)      Long PIP (0-100)      Long DIP (0-70)      Ring MCP (0-90)      Ring PIP (0-100)      Ring DIP (0-70)      Little MCP (0-90)      Little PIP (0-100)      Little DIP (0-70)      (Blank rows = not tested)   UPPER EXTREMITY MMT:     MMT Right eval Left eval  Shoulder flexion    Shoulder abduction    Shoulder adduction    Shoulder extension    Shoulder internal rotation    Shoulder external rotation    Middle trapezius    Lower trapezius    Elbow flexion    Elbow extension    Wrist flexion    Wrist extension  Wrist ulnar deviation    Wrist radial deviation    Wrist pronation    Wrist supination    (Blank rows = not tested)  HAND FUNCTION: Grip strength: Right: 33 lbs; Left: 100 lbs  COORDINATION: 9 Hole Peg test: Right: 28.25 sec; Left: 26.41 sec  SENSATION: Some numbness and tingling in R wrist   EDEMA: none  COGNITION: Overall cognitive status: Within functional limits for tasks assessed Areas of impairment: none  OBSERVATIONS: limited ROM in wrist, impaired grip strength, decrease in overall function   TREATMENT DATE: 06/17/24  Ultrasound x 8 minutes, 3.3 Mhz, 1.2 wts/cm2, 20% duty cycle over R surgical area to reduce pain and massage scar tissue.  Re-took UEFS this date, see Goals for updated measurement.   Pt engaged in activities taking force or impact throuh RUE including boxing and hitting light up targets and completing ball tosses. Pt reported no concerns.  Pt reminded of heat and cold modalities and provided handouts for improved carryover. See Pt instructions for handouts.    PATIENT EDUCATION: Education details: SEE ABOVE Person educated: Patient Education method: Explanation, Demonstration, and Handouts Education comprehension: verbalized understanding and returned demonstration  HOME EXERCISE PROGRAM: 05/01/24: scar massage, wrist PROM (ACCESS CODE:  [id number]) 05/05/24: Theraputty HEP with red putty ([id number])  05/13/24: Shoulder isometrics (per pt request): (ACCESS CODE: [id number]) 05/15/24: Elbow/forearm isometrics ([id number]) 05/20/24: Wrist flexion/extension isometrics ( [id number]) 06/17/24: heat and cold modality usage    GOALS: Goals reviewed with patient? Yes  SHORT TERM GOALS: Target date: 06/12/24  Pt will be independent with scar massage and prescribed HEP for ROM and strengthening of R hand Baseline: Initiated this date Goal status: GOAL MET  2.  Pt will only wear [place] orthosis for comfort and activities involving >5 lbs resistance Baseline: Pt new to OP OT, wears constantly especially for driving  Goal status: GOAL MET  3.  Pt will increase grip strength by at least 10 lbs in R hand Baseline: 33 lbs 05/13/24: 65.0 lbs Goal status: GOAL MET  4.  Pt will increase R wrist flexion and extension AROM by at least 15 degrees Baseline: Flexion 10 degrees, 15 degrees extension 05/13/24: 45 degrees flexion, 19 degrees extension  05/15/24: 50 degrees flexion, 45 degrees extension Goal status: GOAL MET    LONG TERM GOALS: Target date: 06/17/24  Pt will increase wrist flexion AROM by at least 50 degrees Baseline: 10 degrees  05/13/24: 45 degrees 05/15/24: 50 degrees 05/26/24: 65 degrees  Goal status: GOAL MET  2.  Pt will increase wrist extension AROM by at least 45 degrees Baseline: 15 degrees  05/13/24: 19 degrees 05/15/24: 45 degrees 05/26/24: 45 degrees 06/12/24: 52 degrees  06/16/24: 60 degrees Goal status: GOAL MET  3.  Pt will increase ulnar deviation ROM of R wrist by at least 20 degrees Baseline: 5 degrees  05/13/24: 9 degrees 05/15/24: 20 degrees 05/26/24: 30 degrees Goal status: GOAL MET  4.  Pt will increase UEFS index score to at least 70/80, indicating increase of function by 20% Baseline: 54/80  05/26/24: 59/80  06/17/24: 69/80 Goal status: NOT MET  5.  Pt will increase R grip strength  by at least 40 pounds  Baseline: 33 lbs 05/13/24: 65.0 lbs 05/26/24: 70.5 lbs 05/2024: 85.7 Goal status: GOAL MET    ASSESSMENT:  CLINICAL IMPRESSION: Patient is a 33 y.o. male who was seen today for occupational therapy tx for 02/15/24 R wrist ECU reconstruction d/t subluxation. Hx includes ECU tendon sheath reconstruction, bipolar disorder  I, anxiety, B wrist tendonitis. Pt has made great progress and is being discharged this date.  PERFORMANCE DEFICITS: in functional skills including ADLs, IADLs, ROM, strength, pain, skin integrity, and UE functional use.  IMPAIRMENTS: are limiting patient from ADLs, IADLs, and work.   COMORBIDITIES: may have co-morbidities  that affects occupational performance. Patient will benefit from skilled OT to address above impairments and improve overall function.  MODIFICATION OR ASSISTANCE TO COMPLETE EVALUATION: Min-Moderate modification of tasks or assist with assess necessary to complete an evaluation.  OT OCCUPATIONAL PROFILE AND HISTORY: Detailed assessment: Review of records and additional review of physical, cognitive, psychosocial history related to current functional performance.  CLINICAL DECISION MAKING: Moderate - several treatment options, min-mod task modification necessary  REHAB POTENTIAL: Good  EVALUATION COMPLEXITY: Moderate     Loie Jahr, OT 06/17/2024, 4:18 PM

## 2024-06-17 NOTE — Patient Instructions (Addendum)
 How to Use Heat Therapy Heat therapy is the use of heat to relax your muscles. This can help with pain and muscle spasms. Using heat can help if your muscles or joints are: Sore. Stiff. Injured. Tight. What are the risks? Unless your doctor says it's OK, do not use heat therapy if you have any of these things: New bruises. Open or healing wounds. Skin problems, such as: Infected skin. Scars in the spot being treated. Blood problems, such as: Bleeding. Poor blood flow. Blood clots. Loss of feeling in the spot being treated. Swelling that's not normal. Conditions, such as: Diabetes. Heart disease. Cancer. Trouble letting people know when you're in pain. Young children and people who have a brain problem called dementia may have trouble with this. How to use heat therapy Use the heat source that your doctor recommends, such as: A moist heat pack. A hot water bottle. This should be warm but not too hot. An electric heating pad. A heated gel pack. A heated wrap. A warm water bath. Use heat as told. In most cases, you should: Place a towel between your skin and the heat source. Leave the heat on for 20-30 minutes. Your skin may turn pink. If your skin turns red, take off the heat right away to prevent burns. The risk of burns is higher if you can't feel pain, heat, or cold. If told, you can also take a warm water bath. To do so: Put a non-slip pad in the bathtub to prevent a fall. Fill the bathtub with warm water. Check the water temperature. Soak in the water for 15-20 minutes, or for as long as you're told. Be careful when you stand up after the bath. You may feel dizzy. Pat yourself dry after the bath. Do not rub your skin to dry it. General recommendations for heat therapy Be careful to avoid burns. You can get burns from: High heat. Keeping the heat on your skin for too long. Do not sleep while using heat therapy. Only use heat therapy when you're awake. Check your  skin during heat therapy. Do not use heat therapy: For new injuries. On skin that's irritated. This may be from a rash or sunburn. If your skin is red or turns red. Contact a doctor if: You have any of these things in the spot on your body where you use heat therapy: Blisters. Redness. Swelling. Loss of feeling. You have new pain. Your pain gets worse. This information is not intended to replace advice given to you by your health care provider. Make sure you discuss any questions you have with your health care provider. Document Revised: 02/14/2023 Document Reviewed: 02/14/2023 Elsevier Patient Education  2024 [employer] Inc[employer]  How to Use Cold Therapy Cold therapy uses cold temperatures to treat an injury or other problem. You can use cold packs or ice packs to help with pain and swelling. Only use cold therapy if your doctor says it's OK. What are the risks? Your doctor will talk with you about risks. You may be told to avoid cold therapy if: You're not able to let people know when you're in pain. Young children and people who have a brain problem called dementia may have trouble with this. You have certain conditions, such as: Raynaud's syndrome. This is a problem with your blood vessels. It slows blood flow to your fingers and toes. Feeling very cold easily. Lack of feeling in the area being iced. Do not use cold therapy unless your doctor says it's  OK if you have: A heart condition. High blood pressure. Open or healing wounds. An infection. Rheumatoid arthritis. This is pain and swelling in your joints. Poor blood flow. Diabetes. Certain skin problems. How do I make a cold pack? There are a few things you can use to make a cold pack at home. These include: A silica gel cold pack that's been left in the freezer. You can buy this online or in stores. A sealable plastic bag filled with crushed ice. A washcloth or paper towels soaked in cold water or ice water. A plastic bag of  frozen vegetables. Throw the bag away when you're done using it as a cold pack. Supplies needed: A cold pack. A towel. This can be dry or damp. How to use cold therapy  Have your cold pack ready. Place a towel between the cold pack and your skin. Or, you can wrap the cold pack in a towel. Use the cold pack. Leave it on for no more than 20 minutes at a time. Check your skin after 5 minutes for any problems. Check for: White spots on your skin. Your skin may look blotchy or mottled. Skin that looks blue or pale. Skin that feels waxy or hard. Do these steps as many times each day as told. If your skin turns red, take off the cold pack right away to prevent skin damage. The risk of damage is higher if you can't feel pain, heat, or cold. Be sure to always use a towel. Do not put the cold pack right on your skin. Contact a doctor if: You start to have white spots on your skin. Your skin turns blue or pale. Your skin gets waxy or hard. Your swelling gets worse. This information is not intended to replace advice given to you by your health care provider. Make sure you discuss any questions you have with your health care provider. Document Revised: 02/14/2023 Document Reviewed: 02/14/2023 Elsevier Patient Education  2024 [employer].

## 2024-06-17 NOTE — Progress Notes (Deleted)
   Clinton Fisher - 33 y.o. male MRN [id number]  Date of birth: 11/27/1990  Office Visit Note: Visit Date: 06/18/2024 PCP: Knute Thersia Bitters, FNP Referred by: Knute Thersia Bitters, *  Subjective:  HPI: Clinton Fisher is a 33 y.o. male who presents today for follow up 17 weeks status post right wrist extensor carpi ulnar subsheath reconstruction.  Pertinent ROS were reviewed with the patient and found to be negative unless otherwise specified above in HPI.   Assessment & Plan: Visit Diagnoses: No diagnosis found.  Plan: ***  Follow-up: No follow-ups on file.   Meds & Orders: No orders of the defined types were placed in this encounter.  No orders of the defined types were placed in this encounter.    Procedures: No procedures performed       Objective:   Vital Signs: There were no vitals taken for this visit.  Ortho Exam ***  Imaging: No results found.   Anshul Afton Alderton, M.D. [facility], Hand Surgery

## 2024-06-18 ENCOUNTER — Encounter: Payer: MEDICAID | Admitting: Orthopedic Surgery

## 2024-06-23 ENCOUNTER — Ambulatory Visit: Payer: MEDICAID | Admitting: Orthopedic Surgery

## 2024-06-25 ENCOUNTER — Ambulatory Visit: Payer: Self-pay

## 2024-06-25 ENCOUNTER — Ambulatory Visit (INDEPENDENT_AMBULATORY_CARE_PROVIDER_SITE_OTHER): Payer: MEDICAID | Admitting: Family Medicine

## 2024-06-25 ENCOUNTER — Encounter (HOSPITAL_BASED_OUTPATIENT_CLINIC_OR_DEPARTMENT_OTHER): Payer: Self-pay | Admitting: Family Medicine

## 2024-06-25 VITALS — BP 123/89 | HR 97 | Temp 97.9°F | Resp 20 | Ht 69.0 in | Wt 211.0 lb

## 2024-06-25 DIAGNOSIS — M25512 Pain in left shoulder: Secondary | ICD-10-CM | POA: Diagnosis not present

## 2024-06-25 DIAGNOSIS — R222 Localized swelling, mass and lump, trunk: Secondary | ICD-10-CM | POA: Insufficient documentation

## 2024-06-25 MED ORDER — MELOXICAM 15 MG PO TABS: 15.0000 mg | ORAL_TABLET | Freq: Every day | ORAL | 1 refills | Status: AC

## 2024-06-25 MED ORDER — SULFAMETHOXAZOLE-TRIMETHOPRIM 800-160 MG PO TABS: 1.0000 | ORAL_TABLET | Freq: Two times a day (BID) | ORAL | 0 refills | Status: DC

## 2024-06-25 NOTE — Assessment & Plan Note (Signed)
 Swelling and lump likely resolving infection or cyst.  At this time, I do feel that active infection is less likely, however patient would prefer to proceed with antibiotic therapy.  We discussed considerations and risk, precautions. - Prescribed Bactrim  for potential soft tissue infection.

## 2024-06-25 NOTE — Progress Notes (Signed)
 Procedures performed today:    None.  Independent interpretation of notes and tests performed by another provider:   None.  Brief History, Exam, Impression, and Recommendations:    BP 123/89 (BP Location: Left Arm, Patient Position: Sitting, Cuff Size: Normal)   Pulse 97   Temp 97.9 F (36.6 C) (Oral)   Resp 20   Ht 5' 9 (1.753 m)   Wt 211 lb (95.7 kg)   SpO2 97%   BMI 31.16 kg/m   Discussed the use of AI scribe software for clinical note transcription with the patient, who gave verbal consent to proceed.  History of Present Illness Clinton Fisher is a 33 year old male who presents with left shoulder pain and swelling in the groin area.  He has been experiencing left shoulder pain and weakness for the past month. The pain is persistent and worsens with movement, regardless of direction. He has a history of sprains and pulls, but notes that this episode has lasted longer than previous ones. Sleeping on the shoulder may also be a factor, as he often wakes up on that side despite attempts to avoid it. He has limited use of the shoulder in the past week, hoping for recovery, but it remains painful.  He reports swelling in the groin area, specifically between the scrotum and anus, present for at least a week. Initially, the swelling was larger, tender, and warm, but has since decreased in size. No discharge or drainage is noted. He recalls an incident involving ejaculation in a bathtub, considering it a potential cause for the swelling, but is uncertain. He experiences a dull ache that sometimes radiates to his pinky finger, exacerbated by squeezing a specific area. No pain or burning with urination, fever, chills, sweats, or discharge.  He has a history of using meloxicam  for inflammation but currently does not have any. He has previously been on doxycycline  and Bactrim  for other issues.  On exam,  Left shoulder: Obvious swelling, bruising or erythema: absent Deformity of the  shoulder: absent Active ROM: full range with pain Passive ROM: full range with pain Strength: normal/normal Empty can: Negative Hawkins: Negative Neer's: Positive Neurovascular exam: intact  Slightly left centered in perineal area, there is a small nodule/swelling noted on palpation.  No significant tenderness to palpation.  No obvious erythema noted, no fluctuance.  No current drainage or discharge.  Acute pain of left shoulder Assessment & Plan: Suspect symptoms are likely related to subacromial impingement/overuse with recent right wrist surgery and restrictions - Prescribed meloxicam  for anti-inflammatory treatment. - Consider referral to physical therapy if symptoms persist after meloxicam  course - he will let us  know which PT office he would like referral to if so   Swelling of perineal tissue Assessment & Plan: Swelling and lump likely resolving infection or cyst.  At this time, I do feel that active infection is less likely, however patient would prefer to proceed with antibiotic therapy.  We discussed considerations and risk, precautions. - Prescribed Bactrim  for potential soft tissue infection.   Other orders -     Meloxicam ; Take 1 tablet (15 mg total) by mouth daily.  Dispense: 30 tablet; Refill: 1 -     Sulfamethoxazole -Trimethoprim ; Take 1 tablet by mouth 2 (two) times daily.  Dispense: 14 tablet; Refill: 0  Return if symptoms worsen or fail to improve.  Spent 34 minutes on this patient encounter, including preparation, chart review, face-to-face counseling with patient and coordination of care, and documentation of encounter  ___________________________________________ Madine Sarr de [place], MD, ABFM, CAQSM Primary Care and Sports Medicine [facility]

## 2024-06-25 NOTE — Telephone Encounter (Signed)
 FYI Only or Action Required?: FYI only for provider: appointment scheduled on 06/25/24.  Patient was last seen in primary care on 05/21/2024 by Knute Thersia Bitters, FNP.  Called Nurse Triage reporting Groin Swelling.  Symptoms began a week ago.  Interventions attempted: Rest, hydration, or home remedies.  Symptoms are: unchanged.  Triage Disposition: See HCP Within 4 Hours (Or PCP Triage) (overriding See PCP When Office is Open (Within 3 Days))  Patient/caregiver understands and will follow disposition?: Yes     Copied from CRM #[id number]. Topic: Clinical - Red Word Triage >> Jun 25, 2024  8:06 AM Harlene ORN wrote: Red Word that prompted transfer to Nurse Triage: taunt cyst/bruise on scrotum(?) ache in the left arm believes it may be vascular issues Reason for Disposition  All other penis - scrotum symptoms  (Exception: Painless rash < 24 hours duration.)  Answer Assessment - Initial Assessment Questions Pt reports about 1 week ago he noted a hard, tender to touch lump between his scrotum and anus, denies fever or drainage but notes history of abscess. Pt also reports left arm pain that is worsening this morning, denies CP, SOB, HA, vision changes, chest pressure. OV scheduled, This RN educated pt on home care, new-worsening symptoms, when to call back/seek emergent care. Pt verbalized understanding and agrees to plan.    1. SYMPTOM: What's the main symptom you're concerned about? (e.g., blood in semen, discharge or pus from penis, itching, pain, rash, swelling)     Painful lump 2. LOCATION: Where is the lump located?     Between scrotum and anus 3. ONSET: When did symptoms start?     X 1 week 4. PAIN: Is there any pain? If Yes, ask: How bad is it?  (Scale 1-10; or mild, moderate, severe)     Tender to touch, noticeable 5. URINE: Any difficulty passing urine? If Yes, ask: When was the last time?     None 6. CAUSE: What do you think is causing the symptoms?      Unknown 7. OTHER SYMPTOMS: Do you have any other symptoms? (e.g., blood in urine, abdomen pain, fever)     Pt reports left arm pain to [facility], forearm  Protocols used: Penis and Scrotum Symptoms-A-AH

## 2024-06-25 NOTE — Assessment & Plan Note (Signed)
 Suspect symptoms are likely related to subacromial impingement/overuse with recent right wrist surgery and restrictions - Prescribed meloxicam  for anti-inflammatory treatment. - Consider referral to physical therapy if symptoms persist after meloxicam  course - he will let us  know which PT office he would like referral to if so

## 2024-06-29 ENCOUNTER — Emergency Department (HOSPITAL_BASED_OUTPATIENT_CLINIC_OR_DEPARTMENT_OTHER)
Admission: EM | Admit: 2024-06-29 | Discharge: 2024-06-29 | Disposition: A | Discharge status: Home / Self Care | Payer: MEDICAID | Attending: Emergency Medicine | Admitting: Emergency Medicine

## 2024-06-29 ENCOUNTER — Emergency Department (HOSPITAL_BASED_OUTPATIENT_CLINIC_OR_DEPARTMENT_OTHER): Payer: MEDICAID

## 2024-06-29 ENCOUNTER — Encounter (HOSPITAL_BASED_OUTPATIENT_CLINIC_OR_DEPARTMENT_OTHER): Payer: Self-pay

## 2024-06-29 ENCOUNTER — Other Ambulatory Visit: Payer: Self-pay

## 2024-06-29 DIAGNOSIS — N5089 Other specified disorders of the male genital organs: Secondary | ICD-10-CM

## 2024-06-29 LAB — CBC WITH DIFFERENTIAL/PLATELET
Abs Immature Granulocytes: 0.02 K/uL (ref 0.00–0.07)
Basophils Absolute: 0.1 K/uL (ref 0.0–0.1)
Basophils Relative: 1 %
Eosinophils Absolute: 0.2 K/uL (ref 0.0–0.5)
Eosinophils Relative: 3 %
HCT: 46 % (ref 39.0–52.0)
Hemoglobin: 15.3 g/dL (ref 13.0–17.0)
Immature Granulocytes: 0 %
Lymphocytes Relative: 22 %
Lymphs Abs: 1.7 K/uL (ref 0.7–4.0)
MCH: 29.3 pg (ref 26.0–34.0)
MCHC: 33.3 g/dL (ref 30.0–36.0)
MCV: 88.1 fL (ref 80.0–100.0)
Monocytes Absolute: 0.7 K/uL (ref 0.1–1.0)
Monocytes Relative: 9 %
Neutro Abs: 4.9 K/uL (ref 1.7–7.7)
Neutrophils Relative %: 65 %
Platelets: 265 K/uL (ref 150–400)
RBC: 5.22 MIL/uL (ref 4.22–5.81)
RDW: 12.3 % (ref 11.5–15.5)
WBC: 7.5 K/uL (ref 4.0–10.5)
nRBC: 0 % (ref 0.0–0.2)

## 2024-06-29 LAB — BASIC METABOLIC PANEL WITH GFR
Anion gap: 10 (ref 5–15)
BUN: 14 mg/dL (ref 6–20)
CO2: 23 mmol/L (ref 22–32)
Calcium: 9.5 mg/dL (ref 8.9–10.3)
Chloride: 106 mmol/L (ref 98–111)
Creatinine, Ser: 0.95 mg/dL (ref 0.61–1.24)
GFR, Estimated: >60 mL/min (ref >60)
Glucose, Bld: 93 mg/dL (ref 70–99)
Potassium: 4.3 mmol/L (ref 3.5–5.1)
Sodium: 139 mmol/L (ref 135–145)

## 2024-06-29 MED ORDER — KETOROLAC TROMETHAMINE 30 MG/ML IJ SOLN
15.0000 mg | Freq: Once | INTRAMUSCULAR | Status: AC
  Administered 2024-06-29: 15 mg via INTRAVENOUS
  Filled 2024-06-29: qty 1

## 2024-06-29 MED ORDER — IOHEXOL 300 MG/ML  SOLN
100.0000 mL | Freq: Once | INTRAMUSCULAR | Status: AC | PRN
  Administered 2024-06-29: 100 mL via INTRAVENOUS

## 2024-06-29 NOTE — ED Provider Notes (Signed)
 [place] EMERGENCY DEPARTMENT AT [facility] Provider Note   CSN: [id number] Arrival date & time: 06/29/24  9171     Patient presents with: Testicle Pain   Clinton Fisher is a 33 y.o. male.   Patient to ED for further evaluation of ongoing symptoms that involve cysts, or painful areas of swelling to the perineal area. He reports seeing multiple providers, including PCP and urology, and reports he was treated with antibiotics x 2 without any change in the condition. About 4 days ago he states the area ruptured and he saw a significant amount of blood and plasma but no pus. No fever at any point but he states he is now starting to feel generally weak and is experiencing lightheadedness and less ability to focus/concentrate, foggy. No nausea or vomiting. He is immunocompetent.  The history is provided by the patient. No language interpreter was used.  Testicle Pain       Prior to Admission medications   Medication Sig Start Date End Date Taking? Authorizing Provider  acetaminophen  (TYLENOL ) 325 MG tablet Take 650 mg by mouth every 6 (six) hours as needed for mild pain (pain score 1-3) or moderate pain (pain score 4-6). Patient not taking: Reported on 06/25/2024    [provider]  meloxicam  (MOBIC ) 15 MG tablet Take 1 tablet (15 mg total) by mouth daily. 06/25/24   de Cuba, Quintin PARAS, MD  sulfamethoxazole -trimethoprim  (BACTRIM  DS) 800-160 MG tablet Take 1 tablet by mouth 2 (two) times daily. 06/25/24   de Cuba, Raymond J, MD    Allergies: Trazodone and nefazodone, Amoxicillin, Amoxicillin-pot clavulanate, and Clavulanic acid    Review of Systems  Genitourinary:  Positive for testicular pain.    Updated Vital Signs BP 126/88 (BP Location: Right Arm)   Pulse 89   Temp 98 F (36.7 C) (Oral)   Resp 17   SpO2 97%   Physical Exam Constitutional:      Appearance: He is well-developed.  Pulmonary:     Effort: Pulmonary effort is normal.  Abdominal:      General: There is no distension.     Palpations: Abdomen is soft.     Tenderness: There is no abdominal tenderness.  Genitourinary:     Comments: Small palpable mobile cyst that is tender. No significant erythema. Testicles palpably normal, nontender.  Musculoskeletal:        General: Normal range of motion.     Cervical back: Normal range of motion.  Lymphadenopathy:     Lower Body: No right inguinal adenopathy. No left inguinal adenopathy.  Skin:    General: Skin is warm and dry.  Neurological:     Mental Status: He is alert and oriented to person, place, and time.     (all labs ordered are listed, but only abnormal results are displayed) Labs Reviewed  BASIC METABOLIC PANEL WITH GFR  CBC WITH DIFFERENTIAL/PLATELET    EKG: None  Radiology: CT PELVIS W CONTRAST Result Date: 06/29/2024 EXAM: CT PELVIS, WITH IV CONTRAST 06/29/2024 10:36:25 AM TECHNIQUE: Axial images were acquired through the pelvis with IV contrast. Reformatted images were reviewed. Automated exposure control, iterative reconstruction, and/or weight based adjustment of the mA/kV was utilized to reduce the radiation dose to as low as reasonably achievable. COMPARISON: None available. CLINICAL HISTORY: Soft tissue infection suspected, pelvis, xray done; US  done, not xray. FINDINGS: BONES: No acute fracture or focal osseous lesion. JOINTS: No dislocation. The joint spaces are normal. SOFT TISSUES: The soft tissues are unremarkable. INTRAPELVIC  CONTENTS: There is a small cystic region present posteromedially within the prostate measuring approximately 13 x 10 x 16 mm. The prostate is normal in size and otherwise unremarkable in appearance. The urinary bladder appears normal. There are no obvious inflammatory changes. IMPRESSION: 1. No acute findings. 2. Small cystic region within the prostate measuring approximately 13 x 10 x 16 mm. Prostate and urinary bladder otherwise unremarkable. No inflammatory changes identified.  Electronically signed by: Evalene Coho MD 06/29/2024 10:55 AM EST RP Workstation: [id number]     Procedures   Medications Ordered in the ED  ketorolac  (TORADOL ) 30 MG/ML injection 15 mg (15 mg Intravenous Given 06/29/24 1012)  iohexol  (OMNIPAQUE ) 300 MG/ML solution 100 mL (100 mLs Intravenous Contrast Given 06/29/24 1024)    Clinical Course as of 06/29/24 1128  Sun Jun 29, 2024  0909 Patient to ED for further evaluation of cyst to perineum. Scrotal US  done in ED 10/24 when symptomatic with similar symptoms showed no acute findings. The patient has been by ED and appropriately followed up in the outpatient setting. He is here concerned for worsening symptoms. US  performed previously. CT pelvis with CM ordered for full look at the pelvis.  [SU]  1001 Patient requesting pain medication. Toradol  ordered.  [SU]  1102 CT per radiology:  IMPRESSION: 1. No acute findings. 2. Small cystic region within the prostate measuring approximately 13 x 10 x 16 mm. Prostate and urinary bladder otherwise unremarkable. No inflammatory changes identified.  [SU]  1122 Discussed reassuring CT findings with the patient who requested I talk to his mother, and Clinton Fisher. This was done via phone. All questions answered. Mother states she is concerned there is mental health problem but also adds there is no other information she wants to relay.  The patient is encouraged to see urology if/when the cyst re-accumulates to see if this facilitates resolution of recurrent condition. The patient is not happy with answers provided today. I'm going home, flip a few tables, and return to business as usual tomorrow.   [SU]    Clinical Course User Index [SU] Odell Balls, PA-C                                 Medical Decision Making Amount and/or Complexity of Data Reviewed Labs: ordered. Radiology: ordered.  Risk Prescription drug management.        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

## 2024-06-29 NOTE — Discharge Instructions (Signed)
 As we discussed, continue outpatient follow up for diagnosis and resolution of recurrent symptoms.

## 2024-06-29 NOTE — ED Triage Notes (Signed)
 He c/o persistent testicle pain. Seen here for same and underwent diagnostics recently. He is ambulatory and in no distress.

## 2024-06-30 ENCOUNTER — Ambulatory Visit: Payer: MEDICAID | Admitting: Orthopedic Surgery

## 2024-06-30 DIAGNOSIS — M25512 Pain in left shoulder: Secondary | ICD-10-CM

## 2024-06-30 DIAGNOSIS — M778 Other enthesopathies, not elsewhere classified: Secondary | ICD-10-CM

## 2024-06-30 NOTE — Progress Notes (Signed)
 Clinton Fisher - 33 y.o. male MRN [id number]  Date of birth: [birth year]  Office Visit Note: Visit Date: 06/30/2024 PCP: Knute Thersia Bitters, FNP Referred by: Knute Thersia Bitters, *  Subjective:  HPI: Clinton Fisher is a 33 y.o. male who presents today for follow up 17 weeks status post right wrist extensor carpi ulnar subsheath reconstruction.  Doing very well from the right wrist standpoint, range of motion and strength has recovered very nicely, he is very pleased with his progress.  He is describing some ongoing left wrist soreness along the ulnar aspect as well without notable frank instability.  Does also have some ongoing left shoulder pain that has been more progressive in nature more recently, is looking for potential referral to a specialist.  Pertinent ROS were reviewed with the patient and found to be negative unless otherwise specified above in HPI.   Assessment & Plan: Visit Diagnoses:  1. Wrist tendonitis   2. Acute pain of left shoulder     Plan: Extensive discussion was had with the patient today regarding his right wrist ECU stabilization procedure.  He has done very well postoperatively, I am very pleased with his outcome as is he.  As far as the left wrist, he does not demonstrate significant frank ECU stability on examination today, he does have some ongoing ECU tendinitis.  As with the other side, we can begin with conservative measures with bracing and activity modification which she has already started trying.  He will return to me in the future should symptoms worsen or progress.  As far as the left shoulder, he is demonstrating some ongoing tendinitis of the left shoulder as well with some radiation down into the upper arm.  Given his request, we will place referral today to Dr. Genelle for further evaluation moving forward.  He is welcome to return to me as needed.  Follow-up: No follow-ups on file.   Meds & Orders: No orders of the defined types were  placed in this encounter.   Orders Placed This Encounter  Procedures   Ambulatory referral to Orthopedic Surgery     Procedures: No procedures performed       Objective:   Vital Signs: There were no vitals taken for this visit.  Ortho Exam Right wrist: - Well-healed incisional site, full range of motion with flexion and extension, radial ulnar deviation without significant pain, composite fist without restriction, rotation of the wrist with supination and pronation as well as ulnar deviation and supination does not demonstrate any ECU subluxation  Left upper extremity: - Tenderness along the ECU tendon, ulnar deviation and supination does not demonstrate significant ECU subluxation, there is ongoing pain with this maneuver, composite fist without significant restriction - Notable tenderness over the lateral aspect of the shoulder, positive Jobes and empty can - Minimal tenderness over the medial lateral epicondyle, there is some soreness at the distal biceps region as well  Imaging: CT PELVIS W CONTRAST Result Date: 06/29/2024 EXAM: CT PELVIS, WITH IV CONTRAST 06/29/2024 10:36:25 AM TECHNIQUE: Axial images were acquired through the pelvis with IV contrast. Reformatted images were reviewed. Automated exposure control, iterative reconstruction, and/or weight based adjustment of the mA/kV was utilized to reduce the radiation dose to as low as reasonably achievable. COMPARISON: None available. CLINICAL HISTORY: Soft tissue infection suspected, pelvis, xray done; US  done, not xray. FINDINGS: BONES: No acute fracture or focal osseous lesion. JOINTS: No dislocation. The joint spaces are normal. SOFT TISSUES: The soft tissues are unremarkable.  INTRAPELVIC CONTENTS: There is a small cystic region present posteromedially within the prostate measuring approximately 13 x 10 x 16 mm. The prostate is normal in size and otherwise unremarkable in appearance. The urinary bladder appears normal. There are no  obvious inflammatory changes. IMPRESSION: 1. No acute findings. 2. Small cystic region within the prostate measuring approximately 13 x 10 x 16 mm. Prostate and urinary bladder otherwise unremarkable. No inflammatory changes identified. Electronically signed by: Evalene Coho MD 06/29/2024 10:55 AM EST RP Workstation: [id number]     Gildardo Afton Alderton, M.D. [facility], Hand Surgery

## 2024-07-02 ENCOUNTER — Telehealth: Payer: Self-pay

## 2024-07-02 ENCOUNTER — Other Ambulatory Visit: Payer: Self-pay

## 2024-07-02 DIAGNOSIS — N4889 Other specified disorders of penis: Secondary | ICD-10-CM

## 2024-07-02 NOTE — Telephone Encounter (Signed)
 Verbal from Dr. Sherrilee for referral to internal urology to [place]. Pt made aware and voiced understanding

## 2024-07-02 NOTE — Telephone Encounter (Signed)
 Patient came to office and filled out the Walk-in Encounter.  Per patient  wanting a second opinion from [facility] [place]. Was instructed by [facility] -  to get approval from Dr. Belvie Clara before making an appointment.  Please advise.

## 2024-07-08 ENCOUNTER — Encounter: Payer: Self-pay | Admitting: Urology

## 2024-07-08 ENCOUNTER — Ambulatory Visit: Payer: MEDICAID | Admitting: Urology

## 2024-07-08 VITALS — BP 143/92 | HR 68 | Ht 69.0 in | Wt 210.0 lb

## 2024-07-09 ENCOUNTER — Other Ambulatory Visit: Payer: Self-pay | Admitting: *Deleted

## 2024-07-09 ENCOUNTER — Telehealth (HOSPITAL_BASED_OUTPATIENT_CLINIC_OR_DEPARTMENT_OTHER): Payer: Self-pay | Admitting: Family Medicine

## 2024-07-09 ENCOUNTER — Encounter: Payer: Self-pay | Admitting: Urology

## 2024-07-09 DIAGNOSIS — R222 Localized swelling, mass and lump, trunk: Secondary | ICD-10-CM

## 2024-07-09 MED ORDER — AMITRIPTYLINE HCL 10 MG PO TABS: 10.0000 mg | ORAL_TABLET | Freq: Every day | ORAL | 1 refills | Status: AC

## 2024-07-09 NOTE — Telephone Encounter (Signed)
 Copied from CRM #[id number]. Topic: Referral - Question >> Jul 09, 2024 12:35 PM Avram MATSU wrote: Reason for CRM: patient stated he would like a referral to go to [facility] general surgery and stated the cysts will not go away with antibiotics, please advise [phone]

## 2024-07-09 NOTE — Progress Notes (Signed)
 07/08/2024 7:15 AM   Clinton Fisher [birth year] [id number]  Referring provider: Sherrilee Belvie CROME, MD [address] [place],  [place] [address]  Chief Complaint  Patient presents with   Other    cyst    HPI: Clinton Fisher is a 33 y.o. male referred for a second opinion regarding chronic scrotal content pain.  Previously followed by Dr. Sherrilee status post excision of penile cyst and scrotal cysts 07/12/2023 and 11/09/2022 respectively.  After both procedures he developed abscess which spontaneously drained Saw Dr. Sherrilee 05/28/2024 with a 3-83-month history of left hemiscrotal pain.  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  He was started empirically on doxycycline  in the ED and meloxicam  was started by Dr. Lynda He complains of fullness of the left hemiscrotum and what he describes as severe pain from the left external ring to the testis. Follow-up ED visit in [facility] 06/29/2024 complaining of a painful cyst in the left perineum.  CT pelvis was ordered which showed no significant subcutaneous abnormalities.  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   Manic affective disorder with recurrent episode (HCC)    Seasonal allergies    sore throat 07/29/2013   Skin problem 12/07/2021    Surgical History: Past Surgical History:  Procedure Laterality Date   CYST EXCISION PERINEAL N/A 07/12/2023   Procedure: SCROTUM EXPLORATION-excision of penile sebaceous cyst;  Surgeon: Sherrilee Belvie CROME, MD;  Location: [facility];  Service: Urology;  Laterality: N/A;   FOOT FOREIGN  BODY REMOVAL Right 01/19/2011   KNEE ARTHROSCOPY Right 08/01/2013   Procedure: ARTHROSCOPY RIGHT KNEE, Chondroplasty, Excision of Plica;  Surgeon: Norleen CROME Gavel, MD;  Location: [facility];  Service: Orthopedics;  Laterality: Right;   REPAIR EXTENSOR TENDON Right 02/15/2024   Procedure: REPAIR, TENDON, EXTENSOR;  Surgeon: Arlinda Buster, MD;  Location: [facility];  Service: Orthopedics;  Laterality: Right;  RIGHT WRIST EXTENSOR CARPI ULNARIS SUBSHEATH RECONSTRUCTION   SCROTAL EXPLORATION N/A 11/09/2022   Procedure: SCROTUM EXPLORATION-excision of scrotal sebaceous cyst;  Surgeon: Sherrilee Belvie CROME, MD;  Location: [facility];  Service: Urology;  Laterality: N/A;    Home Medications:  Allergies as of 07/08/2024       Reactions   Trazodone And Nefazodone Other (See Comments)   priapism   Amoxicillin Diarrhea   Amoxicillin-pot Clavulanate Diarrhea   Diarrhea   Clavulanic Acid Other (See Comments)   Unknown        Medication List        Accurate as of July 08, 2024 11:59 PM. If you have any questions, ask your nurse or doctor.          STOP taking these medications    acetaminophen  325 MG tablet Commonly known as: TYLENOL  Stopped by: Glendia Barba, MD   sulfamethoxazole -trimethoprim  800-160 MG tablet Commonly known as: BACTRIM  DS Stopped by: Glendia Barba, MD       TAKE these medications    meloxicam  15 MG tablet Commonly known as: MOBIC  Take 1 tablet (15 mg total) by mouth daily.  Allergies: Allergies[1]  Family History: Family History  Problem Relation Age of Onset   Depression Mother    Colon polyps Mother     Social History:  reports that he has been smoking e-cigarettes. He has been exposed to tobacco smoke. He has quit using smokeless tobacco. He reports current alcohol use. He reports that he does not use drugs.   Physical Exam: BP (!) 143/92   Pulse 68   Ht 5' 9 (1.753 m)   Wt 210 lb (95.3 kg)   BMI 31.01  kg/m   Constitutional:  Alert, No acute distress. HEENT: [place] AT Respiratory: Normal respiratory effort, no increased work of breathing. GU: Phallus without lesions.  Testes descended bilateral without masses.  No varicocele appreciated.  Diffuse tenderness left spermatic cord; no discrete mass appreciated Psychiatric: Normal mood and affect.   Assessment & Plan:    1.  Chronic left scrotal content pain No improvement with empiric antibiotics/meloxicam  Trial amitriptyline  10 mg daily.  If no improvement and no side effects within 2 weeks he will call back and dose will be titrated 1 month follow-up for recheck.  If no improvement will perform spermatic cord block   Glendia JAYSON Barba, MD  [facility] [address] [place], [place] [address] [phone]    [1]  Allergies Allergen Reactions   Trazodone And Nefazodone Other (See Comments)    priapism   Amoxicillin Diarrhea   Amoxicillin-Pot Clavulanate Diarrhea    Diarrhea    Clavulanic Acid Other (See Comments)    Unknown

## 2024-07-09 NOTE — Telephone Encounter (Signed)
 With [place] being out of the office, routing encounter to Dr. De Cuba to see if he would be okay with a referral to general surgery being placed for pt.

## 2024-07-09 NOTE — Telephone Encounter (Unsigned)
 Copied from CRM #[id number]. Topic: Referral - Question >> Jul 09, 2024 12:35 PM Avram MATSU wrote: Reason for CRM: patient stated he would like a referral to go to [facility] general surgery and stated the cysts will not go away with antibiotics, please advise [phone]

## 2024-07-10 NOTE — Addendum Note (Signed)
 Addended by: DE CUBA, QUINTIN J on: 07/10/2024 08:30 AM   Modules accepted: Orders

## 2024-07-23 ENCOUNTER — Ambulatory Visit: Payer: MEDICAID | Admitting: Urology

## 2024-08-06 ENCOUNTER — Ambulatory Visit (INDEPENDENT_AMBULATORY_CARE_PROVIDER_SITE_OTHER): Payer: MEDICAID | Admitting: Orthopaedic Surgery

## 2024-08-06 ENCOUNTER — Ambulatory Visit (HOSPITAL_BASED_OUTPATIENT_CLINIC_OR_DEPARTMENT_OTHER): Payer: MEDICAID

## 2024-08-06 ENCOUNTER — Encounter (HOSPITAL_BASED_OUTPATIENT_CLINIC_OR_DEPARTMENT_OTHER): Payer: Self-pay | Admitting: Orthopaedic Surgery

## 2024-08-06 DIAGNOSIS — M25512 Pain in left shoulder: Secondary | ICD-10-CM | POA: Diagnosis not present

## 2024-08-06 NOTE — Addendum Note (Signed)
 Addended by: WOLFGANG CONLEY HERO on: 08/06/2024 12:40 PM   Modules accepted: Orders

## 2024-08-06 NOTE — Progress Notes (Signed)
 "   Chief Complaint: Left shoulder pain     History of Present Illness:    Clinton Fisher is a 34 y.o. male presents today with ongoing left shoulder pain over the course of the last 6 to 8 months.  He states that he slept funny on the shoulder last year and since that time has had deep pain in the joint.  He has began noticing this after his most recent right wrist surgery with Dr. Erwin.  He states that he does not feel like the shoulder is seated.  He does have a history of what sounds like multiple subluxations as a younger child.  Since that time his shoulder is not felt normal compared to the contralateral side and often gives out/is weak.    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   Manic affective disorder with recurrent episode (HCC)    Seasonal allergies    sore throat 07/29/2013   Skin problem 12/07/2021   Past Surgical History:  Procedure Laterality Date   CYST EXCISION PERINEAL N/A 07/12/2023   Procedure: SCROTUM EXPLORATION-excision of penile sebaceous cyst;  Surgeon: Sherrilee Belvie CROME, MD;  Location: [facility];  Service: Urology;  Laterality: N/A;   FOOT FOREIGN BODY REMOVAL Right 01/19/2011   KNEE ARTHROSCOPY Right 08/01/2013   Procedure: ARTHROSCOPY RIGHT KNEE, Chondroplasty, Excision of Plica;  Surgeon: Norleen CROME Gavel, MD;  Location: [facility];  Service: Orthopedics;  Laterality: Right;   REPAIR EXTENSOR TENDON Right 02/15/2024   Procedure: REPAIR, TENDON, EXTENSOR;  Surgeon: Arlinda Buster, MD;  Location: [facility];  Service: Orthopedics;  Laterality: Right;  RIGHT WRIST EXTENSOR CARPI ULNARIS SUBSHEATH RECONSTRUCTION   SCROTAL EXPLORATION N/A 11/09/2022   Procedure: SCROTUM EXPLORATION-excision of  scrotal sebaceous cyst;  Surgeon: Sherrilee Belvie CROME, MD;  Location: [facility];  Service: Urology;  Laterality: N/A;   Social History   Socioeconomic History   Marital status: Single    Spouse name: Not on file   Number of children: 1   Years of education: Not on file   Highest education level: Not on file  Occupational History   Not on file  Tobacco Use   Smoking status: Every Day    Types: E-cigarettes    Passive exposure: Current   Smokeless tobacco: Former  Building Services Engineer status: Every Day  Substance and Sexual Activity   Alcohol use: Yes    Comment: every 3 days   Drug use: No   Sexual activity: Not Currently  Other Topics Concern   Not on file  Social History Narrative   Not on file   Social Drivers of Health   Tobacco Use: High Risk (07/29/2024)   Received from [facility]   Patient History    Smoking Tobacco Use: Some Days    Smokeless Tobacco Use: Never    Passive Exposure: Not on file  Financial Resource Strain: Low Risk (08/31/2023)   Overall Financial Resource Strain (CARDIA)    Difficulty of Paying Living Expenses: Not hard at all  Food Insecurity: No Food Insecurity (08/31/2023)   Hunger Vital Sign    Worried About Running Out of Food in the Last Year: Never true    Ran Out of  Food in the Last Year: Never true  Transportation Needs: No Transportation Needs (08/31/2023)   PRAPARE - Administrator, Civil Service (Medical): No    Lack of Transportation (Non-Medical): No  Physical Activity: Sufficiently Active (08/31/2023)   Exercise Vital Sign    Days of Exercise per Week: 7 days    Minutes of Exercise per Session: 60 min  Stress: No Stress Concern Present (08/31/2023)   [employer] of Occupational Health - Occupational Stress Questionnaire    Feeling of Stress : Not at all  Social Connections: Socially Isolated (08/31/2023)   Social Connection and Isolation Panel    Frequency of Communication with Friends and Family: More  than three times a week    Frequency of Social Gatherings with Friends and Family: More than three times a week    Attends Religious Services: Never    Database Administrator or Organizations: No    Attends Banker Meetings: Never    Marital Status: Never married  Depression (PHQ2-9): Low Risk (04/09/2024)   Depression (PHQ2-9)    PHQ-2 Score: 0  Recent Concern: Depression (PHQ2-9) - Medium Risk (02/29/2024)   Depression (PHQ2-9)    PHQ-2 Score: 5  Alcohol Screen: Not on file  Housing: High Risk (08/31/2023)   Housing Stability Vital Sign    Unable to Pay for Housing in the Last Year: No    Number of Times Moved in the Last Year: 2    Homeless in the Last Year: No  Utilities: Not At Risk (08/31/2023)   [employer] Utilities    Threatened with loss of utilities: No  Health Literacy: Adequate Health Literacy (08/31/2023)   B1300 Health Literacy    Frequency of need for help with medical instructions: Never   Family History  Problem Relation Age of Onset   Depression Mother    Colon polyps Mother    Allergies[1] Current Outpatient Medications  Medication Sig Dispense Refill   amitriptyline  (ELAVIL ) 10 MG tablet Take 1 tablet (10 mg total) by mouth at bedtime. 30 tablet 1   meloxicam  (MOBIC ) 15 MG tablet Take 1 tablet (15 mg total) by mouth daily. 30 tablet 1   No current facility-administered medications for this visit.   No results found.  Review of Systems:   A ROS was performed including pertinent positives and negatives as documented in the HPI.  Physical Exam :   Constitutional: NAD and appears stated age Neurological: Alert and oriented Psych: Appropriate affect and cooperative There were no vitals taken for this visit.   Comprehensive Musculoskeletal Exam:    Left shoulder with positive apprehension with 2+ load shift negative posterior load-and-shift.  Range of motion is equal to the contralateral side to 165 degrees bilaterally with external rotation at the side  to 50 degrees internal rotation is to L1 bilaterally   Imaging:   Xray (3 views left shoulder): Normal    I personally reviewed and interpreted the radiographs.   Assessment and Plan:   34 y.o. 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  At this time he has trialed strengthening program without any changes.  Given this we will plan for an MRI of the left shoulder and follow-up discuss results  -Plan for MRI left shoulder and follow-up discuss result   I personally saw and evaluated the patient, and participated in the management and treatment plan.  Elspeth Parker, MD Attending Physician, Orthopedic Surgery  This  document was dictated using Conservation officer, historic buildings. A reasonable attempt at proof reading has been made to minimize errors.    [1]  Allergies Allergen Reactions   Trazodone And Nefazodone Other (See Comments)    priapism   Amoxicillin Diarrhea   Amoxicillin-Pot Clavulanate Diarrhea    Diarrhea    Clavulanic Acid Other (See Comments)    Unknown   "

## 2024-08-07 ENCOUNTER — Encounter (HOSPITAL_BASED_OUTPATIENT_CLINIC_OR_DEPARTMENT_OTHER): Payer: Self-pay | Admitting: Orthopaedic Surgery

## 2024-08-07 ENCOUNTER — Other Ambulatory Visit: Payer: MEDICAID

## 2024-08-08 ENCOUNTER — Ambulatory Visit: Payer: MEDICAID | Admitting: Urology

## 2024-08-08 ENCOUNTER — Encounter (HOSPITAL_BASED_OUTPATIENT_CLINIC_OR_DEPARTMENT_OTHER): Payer: Self-pay | Admitting: Orthopaedic Surgery

## 2024-08-13 ENCOUNTER — Ambulatory Visit
Admission: RE | Admit: 2024-08-13 | Discharge: 2024-08-13 | Disposition: A | Payer: MEDICAID | Source: Ambulatory Visit | Attending: Orthopaedic Surgery

## 2024-08-13 DIAGNOSIS — M25512 Pain in left shoulder: Secondary | ICD-10-CM

## 2024-08-19 ENCOUNTER — Ambulatory Visit: Payer: MEDICAID | Admitting: Urology

## 2024-08-26 ENCOUNTER — Ambulatory Visit: Payer: MEDICAID | Admitting: Urology

## 2024-08-29 ENCOUNTER — Other Ambulatory Visit (HOSPITAL_BASED_OUTPATIENT_CLINIC_OR_DEPARTMENT_OTHER): Payer: Self-pay

## 2024-08-29 ENCOUNTER — Ambulatory Visit (HOSPITAL_BASED_OUTPATIENT_CLINIC_OR_DEPARTMENT_OTHER): Payer: MEDICAID | Admitting: Orthopaedic Surgery

## 2024-08-29 ENCOUNTER — Ambulatory Visit (HOSPITAL_BASED_OUTPATIENT_CLINIC_OR_DEPARTMENT_OTHER): Payer: Self-pay | Admitting: Orthopaedic Surgery

## 2024-08-29 ENCOUNTER — Encounter (HOSPITAL_BASED_OUTPATIENT_CLINIC_OR_DEPARTMENT_OTHER): Payer: Self-pay

## 2024-08-29 DIAGNOSIS — M25512 Pain in left shoulder: Secondary | ICD-10-CM

## 2024-08-29 MED ORDER — ASPIRIN 325 MG PO TBEC
325.0000 mg | DELAYED_RELEASE_TABLET | Freq: Every day | ORAL | 0 refills | Status: AC
  Filled 2024-08-29: qty 14, 14d supply, fill #0

## 2024-08-29 MED ORDER — OXYCODONE HCL 5 MG PO TABS
5.0000 mg | ORAL_TABLET | ORAL | 0 refills | Status: AC | PRN
  Filled 2024-08-29: qty 10, 2d supply, fill #0

## 2024-08-29 MED ORDER — ACETAMINOPHEN 500 MG PO TABS
500.0000 mg | ORAL_TABLET | Freq: Three times a day (TID) | ORAL | 0 refills | Status: AC
  Filled 2024-08-29: qty 30, 10d supply, fill #0

## 2024-08-29 MED ORDER — IBUPROFEN 800 MG PO TABS
800.0000 mg | ORAL_TABLET | Freq: Three times a day (TID) | ORAL | 0 refills | Status: AC
  Filled 2024-08-29: qty 30, 10d supply, fill #0

## 2024-08-29 NOTE — Progress Notes (Signed)
 "   Chief Complaint: Left shoulder pain     History of Present Illness:   08/29/2024: Presents today for follow-up of the left shoulder.  He is still experiencing pain deep in the joint.  He is here today for MRI discussion  Clinton Fisher is a 34 y.o. male presents today with ongoing left shoulder pain over the course of the last 6 to 8 months.  He states that he slept funny on the shoulder last year and since that time has had deep pain in the joint.  He has began noticing this after his most recent right wrist surgery with Dr. Erwin.  He states that he does not feel like the shoulder is seated.  He does have a history of what sounds like multiple subluxations as a younger child.  Since that time his shoulder is not felt normal compared to the contralateral side and often gives out/is weak.    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   Manic affective disorder with recurrent episode (HCC)    Seasonal allergies    sore throat 07/29/2013   Skin problem 12/07/2021   Past Surgical History:  Procedure Laterality Date   CYST EXCISION PERINEAL N/A 07/12/2023   Procedure: SCROTUM EXPLORATION-excision of penile sebaceous cyst;  Surgeon: Sherrilee Belvie CROME, MD;  Location: [facility];  Service: Urology;  Laterality: N/A;   FOOT FOREIGN BODY REMOVAL Right 01/19/2011   KNEE ARTHROSCOPY Right 08/01/2013   Procedure: ARTHROSCOPY RIGHT KNEE, Chondroplasty, Excision of Plica;  Surgeon: Norleen CROME Gavel, MD;  Location: [facility];  Service: Orthopedics;  Laterality: Right;   REPAIR EXTENSOR TENDON Right 02/15/2024   Procedure: REPAIR, TENDON, EXTENSOR;  Surgeon: Arlinda Buster, MD;  Location: [facility];  Service: Orthopedics;  Laterality:  Right;  RIGHT WRIST EXTENSOR CARPI ULNARIS SUBSHEATH RECONSTRUCTION   SCROTAL EXPLORATION N/A 11/09/2022   Procedure: SCROTUM EXPLORATION-excision of scrotal sebaceous cyst;  Surgeon: Sherrilee Belvie CROME, MD;  Location: [facility];  Service: Urology;  Laterality: N/A;   Social History   Socioeconomic History   Marital status: Single    Spouse name: Not on file   Number of children: 1   Years of education: Not on file   Highest education level: Not on file  Occupational History   Not on file  Tobacco Use   Smoking status: Every Day    Types: E-cigarettes    Passive exposure: Current   Smokeless tobacco: Former  Building Services Engineer status: Every Day  Substance and Sexual Activity   Alcohol use: Yes    Comment: every 3 days   Drug use: No   Sexual activity: Not Currently  Other Topics Concern   Not on file  Social History Narrative   Not on file   Social Drivers of Health   Tobacco Use: High Risk (07/29/2024)   Received [facility]   Patient History    Smoking Tobacco Use: Some Days    Smokeless Tobacco Use: Never    Passive Exposure: Not on file  Financial Resource Strain: Low Risk (08/31/2023)   Overall Financial Resource Strain (CARDIA)    Difficulty of Paying Living Expenses: Not hard at all  Food Insecurity: No Food Insecurity (  08/31/2023)   Hunger Vital Sign    Worried About Running Out of Food in the Last Year: Never true    Ran Out of Food in the Last Year: Never true  Transportation Needs: No Transportation Needs (08/31/2023)   PRAPARE - Administrator, Civil Service (Medical): No    Lack of Transportation (Non-Medical): No  Physical Activity: Sufficiently Active (08/31/2023)   Exercise Vital Sign    Days of Exercise per Week: 7 days    Minutes of Exercise per Session: 60 min  Stress: No Stress Concern Present (08/31/2023)   [employer] of Occupational Health - Occupational Stress Questionnaire    Feeling of Stress : Not at all   Social Connections: Socially Isolated (08/31/2023)   Social Connection and Isolation Panel    Frequency of Communication with Friends and Family: More than three times a week    Frequency of Social Gatherings with Friends and Family: More than three times a week    Attends Religious Services: Never    Database Administrator or Organizations: No    Attends Banker Meetings: Never    Marital Status: Never married  Depression (PHQ2-9): Low Risk (04/09/2024)   Depression (PHQ2-9)    PHQ-2 Score: 0  Recent Concern: Depression (PHQ2-9) - Medium Risk (02/29/2024)   Depression (PHQ2-9)    PHQ-2 Score: 5  Alcohol Screen: Not on file  Housing: High Risk (08/31/2023)   Housing Stability Vital Sign    Unable to Pay for Housing in the Last Year: No    Number of Times Moved in the Last Year: 2    Homeless in the Last Year: No  Utilities: Not At Risk (08/31/2023)   [employer] Utilities    Threatened with loss of utilities: No  Health Literacy: Adequate Health Literacy (08/31/2023)   B1300 Health Literacy    Frequency of need for help with medical instructions: Never   Family History  Problem Relation Age of Onset   Depression Mother    Colon polyps Mother    Allergies[1] Current Outpatient Medications  Medication Sig Dispense Refill   acetaminophen  (TYLENOL ) 500 MG tablet Take 1 tablet (500 mg total) by mouth every 8 (eight) hours for 10 days. 30 tablet 0   aspirin  EC 325 MG tablet Take 1 tablet (325 mg total) by mouth daily. 14 tablet 0   ibuprofen  (ADVIL ) 800 MG tablet Take 1 tablet (800 mg total) by mouth every 8 (eight) hours for 10 days. Please take with food, please alternate with acetaminophen  30 tablet 0   oxyCODONE  (ROXICODONE ) 5 MG immediate release tablet Take 1 tablet (5 mg total) by mouth every 4 (four) hours as needed for severe pain (pain score 7-10) or breakthrough pain. 10 tablet 0   amitriptyline  (ELAVIL ) 10 MG tablet Take 1 tablet (10 mg total) by mouth at bedtime. 30 tablet 1    meloxicam  (MOBIC ) 15 MG tablet Take 1 tablet (15 mg total) by mouth daily. 30 tablet 1   No current facility-administered medications for this visit.   No results found.  Review of Systems:   A ROS was performed including pertinent positives and negatives as documented in the HPI.  Physical Exam :   Constitutional: NAD and appears stated age Neurological: Alert and oriented Psych: Appropriate affect and cooperative There were no vitals taken for this visit.   Comprehensive Musculoskeletal Exam:    Left shoulder with positive apprehension with 2+ load shift negative posterior load-and-shift.  Range of  motion is equal to the contralateral side to 165 degrees bilaterally with external rotation at the side to 50 degrees internal rotation is to L1 bilaterally   Imaging:   Xray (3 views left shoulder): Normal  MRI left shoulder: Inferior labral tear   I personally reviewed and interpreted the radiographs.   Assessment and Plan:   34 y.o. 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  At this time I did discuss treatment options.  I do not believe an injection would give him longer lasting relief.  He has trialed strengthening in the past without significant relief.  Given this we did discuss possibility of left shoulder arthroscopy.  He would like to proceed this.  I discussed risk limitation as well as associated recovery timeframe  - Plan for left shoulder arthroscopy with labral repair   After a lengthy discussion of treatment options, including risks, benefits, alternatives, complications of surgical and nonsurgical conservative options, the patient elected surgical repair.   The patient  is aware of the material risks  and complications including, but not limited to injury to adjacent structures, neurovascular injury, infection, numbness, bleeding,  implant failure, thermal burns, stiffness, persistent pain, failure to heal, disease transmission from allograft, need for further surgery, dislocation, anesthetic risks, blood clots, risks of death,and others. The probabilities of surgical success and failure discussed with patient given their particular co-morbidities.The time and nature of expected rehabilitation and recovery was discussed.The patient's questions were all answered preoperatively.  No barriers to understanding were noted. I explained the natural history of the disease process and Rx rationale.  I explained to the patient what I considered to be reasonable expectations given their personal situation.  The final treatment plan was arrived at through a shared patient decision making process model.    I personally saw and evaluated the patient, and participated in the management and treatment plan.  Elspeth Parker, MD Attending Physician, Orthopedic Surgery  This document was dictated using Dragon voice recognition software. A reasonable attempt at proof reading has been made to minimize errors.     [1]  Allergies Allergen Reactions   Trazodone And Nefazodone Other (See Comments)    priapism   Amoxicillin Diarrhea   Amoxicillin-Pot Clavulanate Diarrhea    Diarrhea    Clavulanic Acid Other (See Comments)    Unknown   "

## 2024-09-03 ENCOUNTER — Ambulatory Visit (HOSPITAL_BASED_OUTPATIENT_CLINIC_OR_DEPARTMENT_OTHER): Payer: MEDICAID

## 2024-09-23 ENCOUNTER — Ambulatory Visit: Payer: MEDICAID | Admitting: Urology

## 2024-09-24 ENCOUNTER — Ambulatory Visit: Payer: MEDICAID | Admitting: Urology

## 2025-03-04 ENCOUNTER — Encounter (HOSPITAL_BASED_OUTPATIENT_CLINIC_OR_DEPARTMENT_OTHER): Payer: MEDICAID | Admitting: Family Medicine
